# Patient Record
Sex: Female | Born: 1940 | Race: Black or African American | Hispanic: No | Marital: Single | Smoking: Never smoker
Health system: Southern US, Community
[De-identification: ages and names within clinical notes are randomized; demographics above are authoritative.]

## PROBLEM LIST (undated history)

## (undated) DIAGNOSIS — Z9289 Personal history of other medical treatment: Secondary | ICD-10-CM

## (undated) DIAGNOSIS — K5190 Ulcerative colitis, unspecified, without complications: Secondary | ICD-10-CM

## (undated) DIAGNOSIS — Z9221 Personal history of antineoplastic chemotherapy: Secondary | ICD-10-CM

## (undated) DIAGNOSIS — I10 Essential (primary) hypertension: Secondary | ICD-10-CM

## (undated) DIAGNOSIS — E559 Vitamin D deficiency, unspecified: Secondary | ICD-10-CM

## (undated) DIAGNOSIS — C569 Malignant neoplasm of unspecified ovary: Secondary | ICD-10-CM

## (undated) DIAGNOSIS — D509 Iron deficiency anemia, unspecified: Secondary | ICD-10-CM

## (undated) DIAGNOSIS — E782 Mixed hyperlipidemia: Secondary | ICD-10-CM

## (undated) DIAGNOSIS — H269 Unspecified cataract: Secondary | ICD-10-CM

## (undated) DIAGNOSIS — M1990 Unspecified osteoarthritis, unspecified site: Secondary | ICD-10-CM

## (undated) DIAGNOSIS — F209 Schizophrenia, unspecified: Secondary | ICD-10-CM

## (undated) DIAGNOSIS — K5090 Crohn's disease, unspecified, without complications: Secondary | ICD-10-CM

## (undated) DIAGNOSIS — E538 Deficiency of other specified B group vitamins: Secondary | ICD-10-CM

## (undated) HISTORY — PX: REPLACEMENT TOTAL KNEE: SUR1224

---

## 2015-04-20 HISTORY — PX: PORTACATH PLACEMENT: SHX2246

## 2015-04-25 ENCOUNTER — Inpatient Hospital Stay (HOSPITAL_COMMUNITY)
Admission: EM | Admit: 2015-04-25 | Discharge: 2015-04-28 | DRG: 357 | Disposition: A | Discharge status: Home / Self Care | Payer: Medicare Other | Attending: Family Medicine | Admitting: Family Medicine

## 2015-04-25 ENCOUNTER — Emergency Department (HOSPITAL_COMMUNITY): Payer: Medicare Other

## 2015-04-25 ENCOUNTER — Encounter (HOSPITAL_COMMUNITY): Payer: Self-pay | Admitting: Emergency Medicine

## 2015-04-25 DIAGNOSIS — R180 Malignant ascites: Secondary | ICD-10-CM | POA: Diagnosis present

## 2015-04-25 DIAGNOSIS — K529 Noninfective gastroenteritis and colitis, unspecified: Secondary | ICD-10-CM | POA: Diagnosis present

## 2015-04-25 DIAGNOSIS — C786 Secondary malignant neoplasm of retroperitoneum and peritoneum: Principal | ICD-10-CM | POA: Diagnosis present

## 2015-04-25 DIAGNOSIS — D649 Anemia, unspecified: Secondary | ICD-10-CM | POA: Diagnosis present

## 2015-04-25 DIAGNOSIS — E440 Moderate protein-calorie malnutrition: Secondary | ICD-10-CM | POA: Diagnosis present

## 2015-04-25 DIAGNOSIS — K668 Other specified disorders of peritoneum: Secondary | ICD-10-CM | POA: Diagnosis not present

## 2015-04-25 DIAGNOSIS — E559 Vitamin D deficiency, unspecified: Secondary | ICD-10-CM | POA: Diagnosis present

## 2015-04-25 DIAGNOSIS — E782 Mixed hyperlipidemia: Secondary | ICD-10-CM | POA: Diagnosis present

## 2015-04-25 DIAGNOSIS — N19 Unspecified kidney failure: Secondary | ICD-10-CM

## 2015-04-25 DIAGNOSIS — K5190 Ulcerative colitis, unspecified, without complications: Secondary | ICD-10-CM | POA: Diagnosis present

## 2015-04-25 DIAGNOSIS — Z79899 Other long term (current) drug therapy: Secondary | ICD-10-CM

## 2015-04-25 DIAGNOSIS — R1900 Intra-abdominal and pelvic swelling, mass and lump, unspecified site: Secondary | ICD-10-CM | POA: Diagnosis present

## 2015-04-25 DIAGNOSIS — Z6824 Body mass index (BMI) 24.0-24.9, adult: Secondary | ICD-10-CM | POA: Diagnosis not present

## 2015-04-25 DIAGNOSIS — E538 Deficiency of other specified B group vitamins: Secondary | ICD-10-CM | POA: Diagnosis present

## 2015-04-25 DIAGNOSIS — N390 Urinary tract infection, site not specified: Secondary | ICD-10-CM | POA: Diagnosis present

## 2015-04-25 DIAGNOSIS — F209 Schizophrenia, unspecified: Secondary | ICD-10-CM | POA: Diagnosis present

## 2015-04-25 DIAGNOSIS — R188 Other ascites: Secondary | ICD-10-CM | POA: Diagnosis not present

## 2015-04-25 DIAGNOSIS — D509 Iron deficiency anemia, unspecified: Secondary | ICD-10-CM | POA: Diagnosis present

## 2015-04-25 DIAGNOSIS — N8110 Cystocele, unspecified: Secondary | ICD-10-CM | POA: Diagnosis present

## 2015-04-25 DIAGNOSIS — C801 Malignant (primary) neoplasm, unspecified: Secondary | ICD-10-CM

## 2015-04-25 DIAGNOSIS — R591 Generalized enlarged lymph nodes: Secondary | ICD-10-CM | POA: Diagnosis not present

## 2015-04-25 DIAGNOSIS — R197 Diarrhea, unspecified: Secondary | ICD-10-CM | POA: Diagnosis present

## 2015-04-25 DIAGNOSIS — Z96651 Presence of right artificial knee joint: Secondary | ICD-10-CM | POA: Diagnosis present

## 2015-04-25 DIAGNOSIS — C569 Malignant neoplasm of unspecified ovary: Secondary | ICD-10-CM | POA: Diagnosis present

## 2015-04-25 DIAGNOSIS — R599 Enlarged lymph nodes, unspecified: Secondary | ICD-10-CM | POA: Diagnosis not present

## 2015-04-25 DIAGNOSIS — R590 Localized enlarged lymph nodes: Secondary | ICD-10-CM | POA: Diagnosis present

## 2015-04-25 DIAGNOSIS — Z7982 Long term (current) use of aspirin: Secondary | ICD-10-CM | POA: Diagnosis not present

## 2015-04-25 DIAGNOSIS — N179 Acute kidney failure, unspecified: Secondary | ICD-10-CM | POA: Diagnosis present

## 2015-04-25 DIAGNOSIS — R110 Nausea: Secondary | ICD-10-CM | POA: Diagnosis present

## 2015-04-25 DIAGNOSIS — R1110 Vomiting, unspecified: Secondary | ICD-10-CM

## 2015-04-25 DIAGNOSIS — I10 Essential (primary) hypertension: Secondary | ICD-10-CM | POA: Diagnosis present

## 2015-04-25 DIAGNOSIS — K669 Disorder of peritoneum, unspecified: Secondary | ICD-10-CM | POA: Diagnosis not present

## 2015-04-25 HISTORY — DX: Schizophrenia, unspecified: F20.9

## 2015-04-25 HISTORY — DX: Crohn's disease, unspecified, without complications: K50.90

## 2015-04-25 HISTORY — DX: Iron deficiency anemia, unspecified: D50.9

## 2015-04-25 HISTORY — DX: Vitamin D deficiency, unspecified: E55.9

## 2015-04-25 HISTORY — DX: Mixed hyperlipidemia: E78.2

## 2015-04-25 HISTORY — DX: Essential (primary) hypertension: I10

## 2015-04-25 HISTORY — DX: Deficiency of other specified B group vitamins: E53.8

## 2015-04-25 HISTORY — DX: Ulcerative colitis, unspecified, without complications: K51.90

## 2015-04-25 LAB — TSH: TSH: 1.99 u[IU]/mL (ref 0.350–4.500)

## 2015-04-25 LAB — COMPREHENSIVE METABOLIC PANEL
ALBUMIN: 3.7 g/dL (ref 3.5–5.0)
ALT: 17 U/L (ref 14–54)
AST: 40 U/L (ref 15–41)
Alkaline Phosphatase: 57 U/L (ref 38–126)
Anion gap: 13 (ref 5–15)
BUN: 43 mg/dL — AB (ref 6–20)
CALCIUM: 9.2 mg/dL (ref 8.9–10.3)
CO2: 20 mmol/L — ABNORMAL LOW (ref 22–32)
CREATININE: 2.69 mg/dL — AB (ref 0.44–1.00)
Chloride: 105 mmol/L (ref 101–111)
GFR calc Af Amer: 19 mL/min — ABNORMAL LOW (ref >60)
GFR calc non Af Amer: 17 mL/min — ABNORMAL LOW (ref >60)
Glucose, Bld: 93 mg/dL (ref 65–99)
POTASSIUM: 4.4 mmol/L (ref 3.5–5.1)
Sodium: 138 mmol/L (ref 135–145)
TOTAL PROTEIN: 7.6 g/dL (ref 6.5–8.1)
Total Bilirubin: 0.6 mg/dL (ref 0.3–1.2)

## 2015-04-25 LAB — PROTIME-INR
INR: 1.28 (ref 0.00–1.49)
PROTHROMBIN TIME: 16.2 s — AB (ref 11.6–15.2)

## 2015-04-25 LAB — RETICULOCYTES
RBC.: 2.88 MIL/uL — AB (ref 3.87–5.11)
RETIC COUNT ABSOLUTE: 40.3 10*3/uL (ref 19.0–186.0)
Retic Ct Pct: 1.4 % (ref 0.4–3.1)

## 2015-04-25 LAB — CBC
HEMATOCRIT: 26.8 % — AB (ref 36.0–46.0)
Hemoglobin: 8.4 g/dL — ABNORMAL LOW (ref 12.0–15.0)
MCH: 28.9 pg (ref 26.0–34.0)
MCHC: 31.3 g/dL (ref 30.0–36.0)
MCV: 92.1 fL (ref 78.0–100.0)
Platelets: 411 10*3/uL — ABNORMAL HIGH (ref 150–400)
RBC: 2.91 MIL/uL — AB (ref 3.87–5.11)
RDW: 14.9 % (ref 11.5–15.5)
WBC: 8.3 10*3/uL (ref 4.0–10.5)

## 2015-04-25 LAB — URINE MICROSCOPIC-ADD ON

## 2015-04-25 LAB — URINALYSIS, ROUTINE W REFLEX MICROSCOPIC
Glucose, UA: NEGATIVE mg/dL
Nitrite: NEGATIVE
PH: 5 (ref 5.0–8.0)
Protein, ur: 30 mg/dL — AB
Specific Gravity, Urine: >1.03 — ABNORMAL HIGH (ref 1.005–1.030)
UROBILINOGEN UA: 0.2 mg/dL (ref 0.0–1.0)

## 2015-04-25 LAB — LACTATE DEHYDROGENASE: LDH: 460 U/L — ABNORMAL HIGH (ref 98–192)

## 2015-04-25 LAB — LIPASE, BLOOD: Lipase: 21 U/L — ABNORMAL LOW (ref 22–51)

## 2015-04-25 MED ORDER — CEFTRIAXONE SODIUM 1 G IJ SOLR
1.0000 g | Freq: Once | INTRAMUSCULAR | Status: AC
  Administered 2015-04-25: 1 g via INTRAVENOUS
  Filled 2015-04-25: qty 10

## 2015-04-25 MED ORDER — SODIUM CHLORIDE 0.9 % IV SOLN
INTRAVENOUS | Status: DC
Start: 2015-04-26 — End: 2015-04-25
  Administered 2015-04-25 (×2): via INTRAVENOUS

## 2015-04-25 MED ORDER — SODIUM CHLORIDE 0.9 % IV SOLN: INTRAVENOUS | Status: DC

## 2015-04-25 MED ORDER — QUETIAPINE FUMARATE ER 300 MG PO TB24
300.0000 mg | ORAL_TABLET | Freq: Every day | ORAL | Status: DC
  Administered 2015-04-25 – 2015-04-27 (×3): 300 mg via ORAL
  Filled 2015-04-25 (×4): qty 1

## 2015-04-25 MED ORDER — ACETAMINOPHEN 325 MG PO TABS: 650.0000 mg | ORAL_TABLET | Freq: Four times a day (QID) | ORAL | Status: DC | PRN

## 2015-04-25 MED ORDER — FAMOTIDINE IN NACL 20-0.9 MG/50ML-% IV SOLN
INTRAVENOUS | Status: AC
  Filled 2015-04-25: qty 50

## 2015-04-25 MED ORDER — ALPRAZOLAM 0.5 MG PO TABS
0.5000 mg | ORAL_TABLET | Freq: Three times a day (TID) | ORAL | Status: DC | PRN
  Filled 2015-04-25: qty 1

## 2015-04-25 MED ORDER — DEXTROSE 5 % IV SOLN
1.0000 g | INTRAVENOUS | Status: DC
  Administered 2015-04-26 – 2015-04-27 (×2): 1 g via INTRAVENOUS
  Filled 2015-04-25 (×2): qty 10

## 2015-04-25 MED ORDER — SULFASALAZINE 500 MG PO TABS
500.0000 mg | ORAL_TABLET | Freq: Three times a day (TID) | ORAL | Status: DC
  Administered 2015-04-25 – 2015-04-28 (×8): 500 mg via ORAL
  Filled 2015-04-25 (×11): qty 1

## 2015-04-25 MED ORDER — MORPHINE SULFATE 2 MG/ML IJ SOLN: 2.0000 mg | INTRAMUSCULAR | Status: DC | PRN

## 2015-04-25 MED ORDER — GUAIFENESIN-DM 100-10 MG/5ML PO SYRP: 5.0000 mL | ORAL_SOLUTION | ORAL | Status: DC | PRN

## 2015-04-25 MED ORDER — SODIUM CHLORIDE 0.9 % IV SOLN
INTRAVENOUS | Status: DC
  Administered 2015-04-25 – 2015-04-26 (×3): via INTRAVENOUS
  Administered 2015-04-27: 1 mL via INTRAVENOUS

## 2015-04-25 MED ORDER — ONDANSETRON HCL 4 MG/2ML IJ SOLN
4.0000 mg | Freq: Once | INTRAMUSCULAR | Status: AC | PRN
  Administered 2015-04-25: 4 mg via INTRAVENOUS
  Filled 2015-04-25: qty 2

## 2015-04-25 MED ORDER — SULFASALAZINE 500 MG PO TBEC
DELAYED_RELEASE_TABLET | ORAL | Status: AC
  Filled 2015-04-25: qty 2

## 2015-04-25 MED ORDER — PROMETHAZINE HCL 12.5 MG PO TABS: 12.5000 mg | ORAL_TABLET | Freq: Four times a day (QID) | ORAL | Status: DC | PRN

## 2015-04-25 MED ORDER — SODIUM CHLORIDE 0.9 % IV BOLUS (SEPSIS)
1000.0000 mL | Freq: Once | INTRAVENOUS | Status: AC
  Administered 2015-04-25: 1000 mL via INTRAVENOUS

## 2015-04-25 MED ORDER — HYDRALAZINE HCL 20 MG/ML IJ SOLN: 5.0000 mg | INTRAMUSCULAR | Status: DC | PRN

## 2015-04-25 MED ORDER — FAMOTIDINE IN NACL 20-0.9 MG/50ML-% IV SOLN
20.0000 mg | INTRAVENOUS | Status: DC
  Administered 2015-04-25 – 2015-04-27 (×3): 20 mg via INTRAVENOUS
  Filled 2015-04-25 (×4): qty 50

## 2015-04-25 MED ORDER — ONDANSETRON HCL 4 MG/2ML IJ SOLN: 4.0000 mg | Freq: Once | INTRAMUSCULAR | Status: DC

## 2015-04-25 MED ORDER — IOHEXOL 300 MG/ML  SOLN
50.0000 mL | Freq: Once | INTRAMUSCULAR | Status: AC | PRN
  Administered 2015-04-25: 50 mL via ORAL

## 2015-04-25 MED ORDER — ONDANSETRON HCL 4 MG/2ML IJ SOLN
4.0000 mg | Freq: Four times a day (QID) | INTRAMUSCULAR | Status: DC
Start: 2015-04-25 — End: 2015-04-28
  Administered 2015-04-25 – 2015-04-28 (×12): 4 mg via INTRAVENOUS
  Filled 2015-04-25 (×12): qty 2

## 2015-04-25 MED ORDER — SODIUM CHLORIDE 0.9 % IV BOLUS (SEPSIS): 250.0000 mL | Freq: Once | INTRAVENOUS | Status: DC

## 2015-04-25 MED ORDER — MIRTAZAPINE 15 MG PO TABS
7.5000 mg | ORAL_TABLET | Freq: Every day | ORAL | Status: DC
  Administered 2015-04-25 – 2015-04-27 (×3): 7.5 mg via ORAL
  Filled 2015-04-25 (×3): qty 1

## 2015-04-25 MED ORDER — ALUM & MAG HYDROXIDE-SIMETH 200-200-20 MG/5ML PO SUSP: 30.0000 mL | Freq: Four times a day (QID) | ORAL | Status: DC | PRN

## 2015-04-25 MED ORDER — ACETAMINOPHEN 650 MG RE SUPP: 650.0000 mg | Freq: Four times a day (QID) | RECTAL | Status: DC | PRN

## 2015-04-25 MED ORDER — AMLODIPINE BESYLATE 5 MG PO TABS
10.0000 mg | ORAL_TABLET | Freq: Every day | ORAL | Status: DC
  Administered 2015-04-26 – 2015-04-28 (×3): 10 mg via ORAL
  Filled 2015-04-25 (×4): qty 2

## 2015-04-25 NOTE — ED Provider Notes (Addendum)
CSN: [id number]     Arrival date & time 04/25/15  1016 History  This chart was scribed for Latasha Sorrow, MD by Thea Alken, ED Scribe. This patient was seen in room APA06/APA06 and the patient's care was started at 11:53 AM.   Chief Complaint  Patient presents with  . Nausea   The history is provided by the patient. No language interpreter was used.    HPI Comments:  Latasha [name] is a 74 y.o. female with pmhx of chron's who present to the Emergency Department complaining of nausea and emesis. Pt states she's had a couple episodes of emesis a day, including today as well as multiple episode of diarrhea for the past 2 weeks.  She's had associated SOB, dizziness and leg swelling.  She denies blood in stool and vomit. Pt denies fever, chills, visiual changes, rhinorrhea, cough, sore throat, SOB, CP, abdominal pain, emesis, diarrhea, dysuria, rash and HA.  Past Medical History  Diagnosis Date  . Hypertension   . Schizophrenia   . Regional enteritis   . Ulcerative colitis   . Mixed hyperlipidemia   . Vitamin D deficiency   . B12 deficiency   . Iron deficiency anemia    Past Surgical History  Procedure Laterality Date  . Replacement total knee      right knee in 2003   No family history on file. History  Substance Use Topics  . Smoking status: Never Smoker   . Smokeless tobacco: Not on file  . Alcohol Use: No   OB History    No data available     Review of Systems  Constitutional: Negative for fever and chills.  HENT: Negative for rhinorrhea and sore throat.   Eyes: Negative for visual disturbance.  Respiratory: Positive for shortness of breath. Negative for cough.   Cardiovascular: Positive for leg swelling. Negative for chest pain.  Gastrointestinal: Positive for nausea, vomiting and diarrhea. Negative for abdominal pain and blood in stool.  Genitourinary: Negative for dysuria and difficulty urinating.  Musculoskeletal: Negative for back pain.  Skin: Negative for rash.   Neurological: Positive for dizziness. Negative for headaches.  Hematological: Does not bruise/bleed easily.    Allergies  Review of patient's allergies indicates no known allergies.  Home Medications   Prior to Admission medications   Medication Sig Start Date End Date Taking? Authorizing Provider  amLODipine (NORVASC) 10 MG tablet Take 10 mg by mouth daily.   Yes Historical Provider, MD  aspirin EC 81 MG tablet Take 81 mg by mouth daily.   Yes Historical Provider, MD  ferrous sulfate 325 (65 FE) MG EC tablet Take 325 mg by mouth 2 (two) times daily.   Yes Historical Provider, MD  folic acid (FOLVITE) 1 MG tablet Take 1 mg by mouth daily.   Yes Historical Provider, MD  lisinopril-hydrochlorothiazide (PRINZIDE,ZESTORETIC) 20-25 MG per tablet Take 1 tablet by mouth daily.   Yes Historical Provider, MD  lovastatin (MEVACOR) 10 MG tablet Take 10 mg by mouth at bedtime.   Yes Historical Provider, MD  mirtazapine (REMERON) 15 MG tablet Take 7.5 mg by mouth at bedtime.   Yes Historical Provider, MD  naproxen (NAPROSYN) 500 MG tablet Take 500 mg by mouth every 12 (twelve) hours as needed for mild pain.   Yes Historical Provider, MD  QUEtiapine (SEROQUEL XR) 300 MG 24 hr tablet Take 300 mg by mouth at bedtime.   Yes Historical Provider, MD  sulfaSALAzine (AZULFIDINE) 500 MG tablet Take 500 mg by mouth 3 (three)  times daily.   Yes Historical Provider, MD  vitamin B-12 (CYANOCOBALAMIN) 1000 MCG tablet Take 1,000 mcg by mouth daily.   Yes Historical Provider, MD   BP 143/66 mmHg  Pulse 112  Temp(Src) 98 F (36.7 C) (Oral)  Resp 18  Ht 5' 6"  (1.676 m)  Wt 170 lb (77.111 kg)  BMI 27.45 kg/m2  SpO2 98% Physical Exam  Constitutional: She is oriented to person, place, and time. She appears well-developed and well-nourished. No distress.  HENT:  Head: Normocephalic and atraumatic.  Mouth/Throat: Oropharynx is clear and moist.  Eyes: Conjunctivae and EOM are normal. Pupils are equal, round, and  reactive to light. No scleral icterus.  Neck: Neck supple.  Cardiovascular: Normal rate, regular rhythm and normal heart sounds.   No murmur heard. Pulmonary/Chest: Effort normal and breath sounds normal. She has no wheezes. She has no rales.  Abdominal: Bowel sounds are normal. She exhibits distension. There is no tenderness.  Musculoskeletal: Normal range of motion. She exhibits no edema.  No leg swelling.  Neurological: She is alert and oriented to person, place, and time. No cranial nerve deficit. She exhibits normal muscle tone. Coordination normal.  Skin: Skin is warm and dry.  Psychiatric: She has a normal mood and affect. Her behavior is normal.  Nursing note and vitals reviewed.  ED Course  Procedures (including critical care time) DIAGNOSTIC STUDIES: Oxygen Saturation is 98% on RA, normal by my interpretation.    COORDINATION OF CARE: 12:06 PM- Pt advised of plan for treatment which includes CT abdomen and pt agrees.  Labs Review Labs Reviewed  COMPREHENSIVE METABOLIC PANEL - Abnormal; Notable for the following:    CO2 20 (*)    BUN 43 (*)    Creatinine, Ser 2.69 (*)    GFR calc non Af Amer 17 (*)    GFR calc Af Amer 19 (*)    All other components within normal limits  CBC - Abnormal; Notable for the following:    RBC 2.91 (*)    Hemoglobin 8.4 (*)    HCT 26.8 (*)    Platelets 411 (*)    All other components within normal limits  URINALYSIS, ROUTINE W REFLEX MICROSCOPIC (NOT AT [facility]) - Abnormal; Notable for the following:    APPearance CLOUDY (*)    Specific Gravity, Urine >1.030 (*)    Hgb urine dipstick SMALL (*)    Bilirubin Urine SMALL (*)    Ketones, ur TRACE (*)    Protein, ur 30 (*)    Leukocytes, UA TRACE (*)    All other components within normal limits  URINE MICROSCOPIC-ADD ON - Abnormal; Notable for the following:    Squamous Epithelial / LPF FEW (*)    Bacteria, UA MANY (*)    Casts GRANULAR CAST (*)    All other components within normal limits   LIPASE, BLOOD - Abnormal; Notable for the following:    Lipase 21 (*)    All other components within normal limits   Results for orders placed or performed during the hospital encounter of 04/25/15  Comprehensive metabolic panel  Result Value Ref Range   Sodium 138 135 - 145 mmol/L   Potassium 4.4 3.5 - 5.1 mmol/L   Chloride 105 101 - 111 mmol/L   CO2 20 (L) 22 - 32 mmol/L   Glucose, Bld 93 65 - 99 mg/dL   BUN 43 (H) 6 - 20 mg/dL   Creatinine, Ser 2.69 (H) 0.44 - 1.00 mg/dL   Calcium 9.2  8.9 - 10.3 mg/dL   Total Protein 7.6 6.5 - 8.1 g/dL   Albumin 3.7 3.5 - 5.0 g/dL   AST 40 15 - 41 U/L   ALT 17 14 - 54 U/L   Alkaline Phosphatase 57 38 - 126 U/L   Total Bilirubin 0.6 0.3 - 1.2 mg/dL   GFR calc non Af Amer 17 (L) >60 mL/min   GFR calc Af Amer 19 (L) >60 mL/min   Anion gap 13 5 - 15  CBC  Result Value Ref Range   WBC 8.3 4.0 - 10.5 K/uL   RBC 2.91 (L) 3.87 - 5.11 MIL/uL   Hemoglobin 8.4 (L) 12.0 - 15.0 g/dL   HCT 26.8 (L) 36.0 - 46.0 %   MCV 92.1 78.0 - 100.0 fL   MCH 28.9 26.0 - 34.0 pg   MCHC 31.3 30.0 - 36.0 g/dL   RDW 14.9 11.5 - 15.5 %   Platelets 411 (H) 150 - 400 K/uL  Urinalysis, Routine w reflex microscopic (not at [facility])  Result Value Ref Range   Color, Urine YELLOW YELLOW   APPearance CLOUDY (A) CLEAR   Specific Gravity, Urine >1.030 (H) 1.005 - 1.030   pH 5.0 5.0 - 8.0   Glucose, UA NEGATIVE NEGATIVE mg/dL   Hgb urine dipstick SMALL (A) NEGATIVE   Bilirubin Urine SMALL (A) NEGATIVE   Ketones, ur TRACE (A) NEGATIVE mg/dL   Protein, ur 30 (A) NEGATIVE mg/dL   Urobilinogen, UA 0.2 0.0 - 1.0 mg/dL   Nitrite NEGATIVE NEGATIVE   Leukocytes, UA TRACE (A) NEGATIVE  Urine microscopic-add on  Result Value Ref Range   Squamous Epithelial / LPF FEW (A) RARE   WBC, UA 3-6 <3 WBC/hpf   RBC / HPF 0-2 <3 RBC/hpf   Bacteria, UA MANY (A) RARE   Casts GRANULAR CAST (A) NEGATIVE  Lipase, blood  Result Value Ref Range   Lipase 21 (L) 22 - 51 U/L     Imaging  Review Ct Abdomen Pelvis Wo Contrast  04/25/2015   CLINICAL DATA:  Vomiting and diarrhea for 2 weeks, shortness of breath, dizziness, leg swelling, history hypertension, ulcerative colitis  EXAM: CT ABDOMEN AND PELVIS WITHOUT CONTRAST  TECHNIQUE: Multidetector CT imaging of the abdomen and pelvis was performed following the standard protocol without IV contrast. IV contrast not utilized due to renal dysfunction, creatinine 2.69. Sagittal and coronal MPR images reconstructed from axial data set. Patient drank dilute oral contrast for exam.  COMPARISON:  None  FINDINGS: Minimal RIGHT pleural effusion.  Within limits of a nonenhanced exam, liver, spleen, pancreas, kidneys, and adrenal glands normal. Normal appendix.  Cystocele present with significant inferior descent of urinary bladder in pelvis.  Extensive ascites.  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  These masses measure up to 5.1 cm in greatest dimension.  Uterus is small and atrophic with multiple calcifications.  Numerous pelvic phleboliths.  Stomach and bowel loops displaced by ascites but otherwise unremarkable.  Upper normal sized to borderline enlarged retroperitoneal nodes without definite adenopathy.  No definite free air, hernia, or acute osseous findings.  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  Cystocele.   Electronically Signed   By: Lavonia Dana M.D.   On: 04/25/2015 15:04     EKG Interpretation None      MDM   Final diagnoses:  Vomiting and diarrhea  Abdominal mass  Renal failure  Anemia, unspecified anemia type    Patient's labs show evidence of anemia. Patient states she has chronic anemias on iron for that. Hemoglobin is above 8 no transfusion required at this time. Labs also showed  evidence of like a prerenal renal failure picture with elevated BUN and creatinine. Urine was very concentrated. Patient clinically appeared a bit dry. Patient received 1 L bolus of normal saline and 100 mL per hour maintenance rate. Patient will require admission for this.  In addition CT scan of the abdomen shows multiple abdominal masses based on radiology very concerning for metastatic ovarian cancer. Patient will require admission for this and beginning of evaluation and workup.  Patient's primary care doctor is at the [facility].  I personally performed the services described in this documentation, which was scribed in my presence. The recorded information has been reviewed and is accurate.     Latasha Sorrow, MD 04/25/15 1518  Latasha Sorrow, MD 04/25/15 [phone]

## 2015-04-25 NOTE — H&P (Signed)
[facility] History and Physical  Sidnie Swalley GGY:[id number] DOB: May 03, 1941 DOA: 04/25/2015  Referring physician: ED physician, Dr. Rogene Houston PCP: [facility]   Chief Complaint: Nausea and diarrhea  HPI: Latasha [name] is a 74 y.o. female With a history of inflammatory bowel disease (query ulcerative colitis versus Crohn's disease), iron deficiency anemia, and schizophrenia, who presents to the emergency department with a chief complaint of nausea and diarrhea. Her symptoms started approximately 2 weeks ago. They have become progressively worse. She has had at least 3-4 loose bowel movements daily. She denies seeing bright red blood, but she describes her stools as "dark"-she attributes this to taking iron supplements. She denies vomiting, but has had dry heaving. Her abdomen has become more swollen over the past couple of weeks. Her appetite has been poor. She has lost approximately 15-20 pounds over the past 6-8 months. She denies fever, chills, or night sweats. She denies abdominal pain or chest pain. She has had some shortness of breath. She denies vaginal bleeding. Her last Pap smear was approximately 20 years ago. Her mammogram this year was within normal limits.  In the ED, she was mildly tachycardic, but otherwise hemodynamically stable. She was oxygenating between 90 and 97% on room air and supplemental oxygen respectively. Her lab data were significant for a CO2 of 20, BUN 43, creatinine 2.69, normal lipase, normal LFTs, hemoglobin of 8.4, and a urinalysis that showed many bacteria and 3-6 WBCs. 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 She is being admitted for further evaluation and management.     Review of Systems:  As above in history present illness. In addition, she denies  auditory or visual hallucinations. Otherwise, review of systems is negative.  Past Medical History  Diagnosis Date  . Hypertension   . Schizophrenia   . Regional enteritis   . Ulcerative colitis   . Mixed hyperlipidemia   . Vitamin D deficiency   . B12 deficiency   . Iron deficiency anemia    Past Surgical History  Procedure Laterality Date  . Replacement total knee      right knee in 2003   Social History: She is single. She lives alone. She has one son, Jeneen Rinks. She denies tobacco, alcohol, and illicit drug use. She still drives.   No Known Allergies  Family history: Patient's parents are deceased, etiology is unknown.  Prior to Admission medications   Medication Sig Start Date End Date Taking? Authorizing Provider  amLODipine (NORVASC) 10 MG tablet Take 10 mg by mouth daily.   Yes Historical Provider, MD  aspirin EC 81 MG tablet Take 81 mg by mouth daily.   Yes Historical Provider, MD  ferrous sulfate 325 (65 FE) MG EC tablet Take 325 mg by mouth 2 (two) times daily.   Yes Historical Provider, MD  folic acid (FOLVITE) 1 MG tablet Take 1 mg by mouth daily.   Yes Historical Provider, MD  lisinopril-hydrochlorothiazide (PRINZIDE,ZESTORETIC) 20-25 MG per tablet Take 1 tablet by mouth daily.   Yes Historical Provider, MD  lovastatin (MEVACOR) 10 MG tablet Take 10 mg by mouth at bedtime.   Yes Historical Provider, MD  mirtazapine (REMERON) 15 MG tablet Take 7.5 mg by mouth at bedtime.   Yes Historical Provider, MD  naproxen (NAPROSYN) 500 MG tablet Take 500 mg by mouth every 12 (twelve) hours as needed for mild pain.   Yes  Historical Provider, MD  QUEtiapine (SEROQUEL XR) 300 MG 24 hr tablet Take 300 mg by mouth at bedtime.   Yes Historical Provider, MD  sulfaSALAzine (AZULFIDINE) 500 MG tablet Take 500 mg by mouth 3 (three) times daily.   Yes Historical Provider, MD  vitamin B-12 (CYANOCOBALAMIN) 1000 MCG tablet Take 1,000 mcg by mouth daily.   Yes Historical Provider, MD   Physical  Exam: Filed Vitals:   04/25/15 1406 04/25/15 1533 04/25/15 1642 04/25/15 1654  BP: 138/77 152/75 153/70   Pulse: 104 103 103   Temp: 97.6 F (36.4 C)  97.9 F (36.6 C)   TempSrc: Oral  Oral   Resp: 18  18   Height:   5' 6"  (1.676 m) 5' 6"  (1.676 m)  Weight:    69.899 kg (154 lb 1.6 oz)  SpO2: 94% 90% 97%     Wt Readings from Last 3 Encounters:  04/25/15 69.899 kg (154 lb 1.6 oz)    General:  Appears calm and comfortable; 74 year old African-American woman in no acute distress.  Eyes: PERRL, normal lids, irises & conjunctiva; Conjunctivae are clear and sclerae are white.  ENT: grossly normal hearing; oropharynx reveals mildly dry mucous membranes.  Neck: no LAD, masses or thyromegaly Cardiovascular: RRR, no m/r/g. No LE edema. Telemetry: Not applicable  Respiratory: CTA bilaterally, no w/r/r. Normal respiratory effort. Abdomen: Distended with ascites, hypoactive bowel sounds, tympanic with mild fluid wave, nontender to mild palpation; no masses palpated, but difficult secondary to ascites.  Skin: no rash or induration seen on limited exam Musculoskeletal: grossly normal tone BUE/BLE; No acute hot red joints.  Psychiatric: Flat and sad affect; occasionally with inappropriate smiling; speech is clear.  Neurologic: grossly non-focal; cranial nerves II through XII are intact.           Labs on Admission:  Basic Metabolic Panel:  Recent Labs Lab 04/25/15 1114  NA 138  K 4.4  CL 105  CO2 20*  GLUCOSE 93  BUN 43*  CREATININE 2.69*  CALCIUM 9.2   Liver Function Tests:  Recent Labs Lab 04/25/15 1114  AST 40  ALT 17  ALKPHOS 57  BILITOT 0.6  PROT 7.6  ALBUMIN 3.7    Recent Labs Lab 04/25/15 1220  LIPASE 21*   No results for input(s): AMMONIA in the last 168 hours. CBC:  Recent Labs Lab 04/25/15 1114  WBC 8.3  HGB 8.4*  HCT 26.8*  MCV 92.1  PLT 411*   Cardiac Enzymes: No results for input(s): CKTOTAL, CKMB, CKMBINDEX, TROPONINI in the last 168  hours.  BNP (last 3 results) No results for input(s): BNP in the last 8760 hours.  ProBNP (last 3 results) No results for input(s): PROBNP in the last 8760 hours.  CBG: No results for input(s): GLUCAP in the last 168 hours.  Radiological Exams on Admission: Ct Abdomen Pelvis Wo Contrast  04/25/2015   CLINICAL DATA:  Vomiting and diarrhea for 2 weeks, shortness of breath, dizziness, leg swelling, history hypertension, ulcerative colitis  EXAM: CT ABDOMEN AND PELVIS WITHOUT CONTRAST  TECHNIQUE: Multidetector CT imaging of the abdomen and pelvis was performed following the standard protocol without IV contrast. IV contrast not utilized due to renal dysfunction, creatinine 2.69. Sagittal and coronal MPR images reconstructed from axial data set. Patient drank dilute oral contrast for exam.  COMPARISON:  None  FINDINGS: Minimal RIGHT pleural effusion.  Within limits of a nonenhanced exam, liver, spleen, pancreas, kidneys, and adrenal glands normal. Normal appendix.  Cystocele present with significant  inferior descent of urinary bladder in pelvis.  Extensive ascites.  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  These masses measure up to 5.1 cm in greatest dimension.  Uterus is small and atrophic with multiple calcifications.  Numerous pelvic phleboliths.  Stomach and bowel loops displaced by ascites but otherwise unremarkable.  Upper normal sized to borderline enlarged retroperitoneal nodes without definite adenopathy.  No definite free air, hernia, or acute osseous findings.  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  Cystocele.   Electronically Signed   By: Lavonia Dana M.D.   On: 04/25/2015 15:04    EKG: Independently reviewed.   Assessment/Plan Principal Problem:    Nausea without vomiting Active Problems:   Ascites, malignant   Peritoneal carcinomatosis   Omental mass   Diarrhea   Acute kidney injury   UTI (lower urinary tract infection)   Normocytic anemia   IBD (inflammatory bowel disease)   Schizophrenia, unspecified type   1. Nausea without vomiting and reported diarrhea. Likely secondary to abdominal/pelvic CT findings of omental masses, ascites, and peritoneal carcinomatosis superimposed on known chronic IBD. 2. Abdominal ascites, likely malignant with associated omental masses and presumed peritoneal carcinomatosis. Per radiology, the findings are suggestive of ovarian cancer. 3. Possible urinary tract infection. Although the patient denies dysuria, under the circumstances, we'll treat empirically for pyuria and bacteriuria. 4. Inflammatory bowel disease. The patient had been seen many years ago at [facility] for either ulcerated colitis or Crohn's disease. The patient stated that she was told that she had Boles at one point or another. She is treated chronically with sulfasalazine. 5. Normocytic anemia. The patient has a history of vitamin B12 deficiency and iron deficiency for which she takes supplements for both. Her hemoglobin is 8.4 on admission. Baseline is unknown. 6. Acute kidney injury. This is presumed in the setting of poor oral intake and chronic treatment with Zestoretic. Baseline creatinine is unknown. 7. Essential hypertension. Her blood pressure is modestly elevated. She is treated chronically with amlodipine and Zestoretic. 8. Schizophrenia, unspecified type. Currently stable. She is treated with Seroquel X are chronically.    Plan: 1. For further evaluation, will order a pelvic ultrasound and CA-125. If the pelvic ultrasound reveals an ovarian mass, would consult GYN-Onc remotely. Could also consider IR consult for possible biopsy of the omental masses. 2. Will order a therapeutic and diagnostic paracentesis; will plan  for it being done on 04/27/15. Will order Gram stain, culture, cell count/differential, and cytology. 3. C. difficile PCR ordered in the ED. Will follow-up on the findings. 4. Will order an anemia panel to evaluate for anemia. Will also order TSH. 5. Will start IV fluid hydration. 6. Will continue amlodipine, but will hold Zestoretic due to acute kidney injury. We'll add when necessary IV hydralazine for hypertension. 7. Urine culture ordered and is pending. Will continue Rocephin for treatment of UTI. 8. Will start Zofran scheduled IV every 6 hours and add when necessary Phenergan. We'll also start IV Pepcid. 9. Clear liquid diet as tolerated.  Code Status: Full code  DVT Prophylaxis:SCDs  Family Communication:Discussed with son Jeneen Rinks.  Disposition Plan: Discharge when clinically appropriate.  Time spent: One hour  [facility] Pager [phone]

## 2015-04-25 NOTE — ED Notes (Signed)
Having nausea for last 2 weeks.  Denies vomiting.  Pt says, bowels are soft and have poor appetite and not able to taste foods.

## 2015-04-26 DIAGNOSIS — C801 Malignant (primary) neoplasm, unspecified: Secondary | ICD-10-CM

## 2015-04-26 DIAGNOSIS — R180 Malignant ascites: Secondary | ICD-10-CM

## 2015-04-26 DIAGNOSIS — N179 Acute kidney failure, unspecified: Secondary | ICD-10-CM

## 2015-04-26 DIAGNOSIS — C786 Secondary malignant neoplasm of retroperitoneum and peritoneum: Principal | ICD-10-CM

## 2015-04-26 DIAGNOSIS — R110 Nausea: Secondary | ICD-10-CM

## 2015-04-26 DIAGNOSIS — N390 Urinary tract infection, site not specified: Secondary | ICD-10-CM

## 2015-04-26 LAB — COMPREHENSIVE METABOLIC PANEL
ALBUMIN: 3 g/dL — AB (ref 3.5–5.0)
ALT: 16 U/L (ref 14–54)
AST: 35 U/L (ref 15–41)
Alkaline Phosphatase: 49 U/L (ref 38–126)
Anion gap: 7 (ref 5–15)
BUN: 30 mg/dL — ABNORMAL HIGH (ref 6–20)
CALCIUM: 8.5 mg/dL — AB (ref 8.9–10.3)
CO2: 22 mmol/L (ref 22–32)
Chloride: 109 mmol/L (ref 101–111)
Creatinine, Ser: 1.6 mg/dL — ABNORMAL HIGH (ref 0.44–1.00)
GFR calc non Af Amer: 31 mL/min — ABNORMAL LOW (ref >60)
GFR, EST AFRICAN AMERICAN: 36 mL/min — AB (ref >60)
GLUCOSE: 94 mg/dL (ref 65–99)
Potassium: 4.3 mmol/L (ref 3.5–5.1)
Sodium: 138 mmol/L (ref 135–145)
Total Bilirubin: 0.4 mg/dL (ref 0.3–1.2)
Total Protein: 6.3 g/dL — ABNORMAL LOW (ref 6.5–8.1)

## 2015-04-26 LAB — CBC
HEMATOCRIT: 26.5 % — AB (ref 36.0–46.0)
Hemoglobin: 8.2 g/dL — ABNORMAL LOW (ref 12.0–15.0)
MCH: 28.4 pg (ref 26.0–34.0)
MCHC: 30.9 g/dL (ref 30.0–36.0)
MCV: 91.7 fL (ref 78.0–100.0)
PLATELETS: 386 10*3/uL (ref 150–400)
RBC: 2.89 MIL/uL — AB (ref 3.87–5.11)
RDW: 14.8 % (ref 11.5–15.5)
WBC: 7.4 10*3/uL (ref 4.0–10.5)

## 2015-04-26 LAB — IRON AND TIBC
IRON: 20 ug/dL — AB (ref 28–170)
SATURATION RATIOS: 12 % (ref 10.4–31.8)
TIBC: 174 ug/dL — ABNORMAL LOW (ref 250–450)
UIBC: 154 ug/dL

## 2015-04-26 LAB — FERRITIN: Ferritin: 1066 ng/mL — ABNORMAL HIGH (ref 11–307)

## 2015-04-26 LAB — C DIFFICILE QUICK SCREEN W PCR REFLEX
C DIFFICILE (CDIFF) INTERP: NEGATIVE
C DIFFICLE (CDIFF) ANTIGEN: NEGATIVE
C Diff toxin: NEGATIVE

## 2015-04-26 LAB — VITAMIN B12: Vitamin B-12: 2055 pg/mL — ABNORMAL HIGH (ref 180–914)

## 2015-04-26 LAB — FOLATE: Folate: 39 ng/mL (ref >5.9)

## 2015-04-26 NOTE — Progress Notes (Signed)
PROGRESS NOTE  Latasha [name] SAY:[id number] DOB: [birth year] DOA: 04/25/2015 PCP: No primary care provider on file.  Summary: 89 yof with a history of inflammatory bowel disease iron deficiency anemia, and schizophrenia presented with complaints of nausea and diarrhea. 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 Admitted for further evaluation and management.  Assessment/[name]: 1. Nausea and diarrhea. Nausea controlled with Zofran. Diarrhea manageable. Cdiff negative.  Likely secondary to abdominal/pelvic CT findings of omental masses, ascites, and peritoneal carcinomatosis superimposed on known chronic IBD. 2. 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 3. AKI, improving.  4. Possible urinary tract infection. Urine culture pending.  5. Inflammatory bowel disease.The patient had been seen many years ago at [facility] for either ulcerated colitis or Crohn's disease. On sulfasalazine. 6. Normocytic anemia. Stable. HGB is currently 8.2. Baseline is unknown.  7. HTN 8. Schizophrenia, unspecified type. Currently stable. She is treated with Seroquel X are chronically.   Overall better today  Paracentesis in AM  Continue Zofran, pain control, IVF, antibiotics  Check BMP and CBC in AM  Consult oncology 8/8 for guidance  Code Status: Full  DVT prophylaxis: SCDs Family Communication: none present. Patient alert and understands above. Disposition [name]: home when improved  Latasha Hodgkins, MD  [facility]  Pager [phone] If 7PM-7AM, please contact night-coverage at [email or web address], password [facility] 04/26/2015, 8:26 AM  LOS: 1 day   Consultants:    Procedures:    Antibiotics:  Rocephin 04/26/15>>  HPI/Subjective: Feels better. No n/v. Little pain. Medication helping with nausea. Abdomen is "puffed" but not  painful.   Objective: Filed Vitals:   04/25/15 1642 04/25/15 1654 04/25/15 2059 04/26/15 0551  BP: 153/70  132/66 116/60  Pulse: 103  97 106  Temp: 97.9 F (36.6 C)  98.7 F (37.1 C) 97.9 F (36.6 C)  TempSrc: Oral  Oral Oral  Resp: 18  18 16   Height: 5' 6"  (1.676 m) 5' 6"  (1.676 m)    Weight:  69.899 kg (154 lb 1.6 oz)  69 kg (152 lb 1.9 oz)  SpO2: 97%  98% 98%    Intake/Output Summary (Last 24 hours) at 04/26/15 0826 Last data filed at 04/26/15 0700  Gross per 24 hour  Intake 1196.25 ml  Output      0 ml  Net 1196.25 ml     Filed Weights   04/25/15 1039 04/25/15 1654 04/26/15 0551  Weight: 77.111 kg (170 lb) 69.899 kg (154 lb 1.6 oz) 69 kg (152 lb 1.9 oz)    Exam:  Afebrile, VSS, not hypoxic General:  Appears calm and comfortable ENT: grossly normal hearing, lips & tongue Cardiovascular: RRR, no m/r/g. No LE edema. Respiratory: CTA bilaterally, no w/r/r. Normal respiratory effort. 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  TSH WNL  Pending data:  Urine culture   Scheduled Meds: . amLODipine  10 mg Oral Daily  . cefTRIAXone (ROCEPHIN)  IV  1 g Intravenous Q24H  . famotidine (PEPCID) IV  20 mg Intravenous Q24H  . mirtazapine  7.5 mg Oral QHS  .  ondansetron (ZOFRAN) IV  4 mg Intravenous 4 times per day  . QUEtiapine  300 mg Oral QHS  . sulfaSALAzine  500 mg Oral TID   Continuous Infusions: . sodium chloride 75 mL/hr at 04/26/15 0600    Principal Problem:   Nausea without vomiting Active Problems:   Ascites, malignant   Peritoneal carcinomatosis    Omental mass   Acute kidney injury   UTI (lower urinary tract infection)   Normocytic anemia   IBD (inflammatory bowel disease)   Schizophrenia, unspecified type   Diarrhea   Time spent 20 minutes  I, Latasha [name], acting as scribe, recorded this note contemporaneously in the presence of Latasha [name]. Latasha [name], M.D. on 04/26/2015 .   I have reviewed the above documentation for accuracy and completeness, and I agree with the above. Latasha Hodgkins, MD

## 2015-04-26 NOTE — Progress Notes (Signed)
During shift assessment +3 Pitting edema noted to Left and Right Lower Extremities.  Pillow placed under patients feet.

## 2015-04-26 NOTE — Progress Notes (Signed)
Utilization review Completed Carlisle Beers RN BSN

## 2015-04-27 ENCOUNTER — Encounter (HOSPITAL_COMMUNITY): Payer: Self-pay

## 2015-04-27 ENCOUNTER — Inpatient Hospital Stay (HOSPITAL_COMMUNITY): Payer: Medicare Other

## 2015-04-27 DIAGNOSIS — R188 Other ascites: Secondary | ICD-10-CM

## 2015-04-27 DIAGNOSIS — K669 Disorder of peritoneum, unspecified: Secondary | ICD-10-CM

## 2015-04-27 DIAGNOSIS — R591 Generalized enlarged lymph nodes: Secondary | ICD-10-CM

## 2015-04-27 DIAGNOSIS — D649 Anemia, unspecified: Secondary | ICD-10-CM

## 2015-04-27 DIAGNOSIS — R599 Enlarged lymph nodes, unspecified: Secondary | ICD-10-CM

## 2015-04-27 DIAGNOSIS — K668 Other specified disorders of peritoneum: Secondary | ICD-10-CM

## 2015-04-27 HISTORY — PX: PARACENTESIS: SHX844

## 2015-04-27 LAB — CBC
HCT: 27.3 % — ABNORMAL LOW (ref 36.0–46.0)
Hemoglobin: 8.5 g/dL — ABNORMAL LOW (ref 12.0–15.0)
MCH: 28.5 pg (ref 26.0–34.0)
MCHC: 31.1 g/dL (ref 30.0–36.0)
MCV: 91.6 fL (ref 78.0–100.0)
Platelets: 414 10*3/uL — ABNORMAL HIGH (ref 150–400)
RBC: 2.98 MIL/uL — AB (ref 3.87–5.11)
RDW: 14.8 % (ref 11.5–15.5)
WBC: 7.8 10*3/uL (ref 4.0–10.5)

## 2015-04-27 LAB — BASIC METABOLIC PANEL
ANION GAP: 7 (ref 5–15)
BUN: 19 mg/dL (ref 6–20)
CALCIUM: 8.6 mg/dL — AB (ref 8.9–10.3)
CO2: 21 mmol/L — ABNORMAL LOW (ref 22–32)
CREATININE: 1.34 mg/dL — AB (ref 0.44–1.00)
Chloride: 110 mmol/L (ref 101–111)
GFR calc Af Amer: 45 mL/min — ABNORMAL LOW (ref >60)
GFR calc non Af Amer: 39 mL/min — ABNORMAL LOW (ref >60)
Glucose, Bld: 100 mg/dL — ABNORMAL HIGH (ref 65–99)
POTASSIUM: 4.5 mmol/L (ref 3.5–5.1)
Sodium: 138 mmol/L (ref 135–145)

## 2015-04-27 LAB — BODY FLUID CELL COUNT WITH DIFFERENTIAL
Eos, Fluid: 0 %
LYMPHS FL: 45 %
Monocyte-Macrophage-Serous Fluid: 39 % — ABNORMAL LOW (ref 50–90)
Neutrophil Count, Fluid: 16 % (ref 0–25)
Total Nucleated Cell Count, Fluid: 692 cu mm (ref 0–1000)

## 2015-04-27 LAB — URINE CULTURE: Culture: NO GROWTH

## 2015-04-27 LAB — PROTEIN, BODY FLUID: TOTAL PROTEIN, FLUID: 4.8 g/dL

## 2015-04-27 LAB — GRAM STAIN

## 2015-04-27 LAB — LACTATE DEHYDROGENASE, PLEURAL OR PERITONEAL FLUID: LD, Fluid: 739 U/L — ABNORMAL HIGH (ref 3–23)

## 2015-04-27 LAB — CA 125: CA 125: 3902 U/mL — ABNORMAL HIGH (ref 0.0–38.1)

## 2015-04-27 MED ORDER — ENOXAPARIN SODIUM 40 MG/0.4ML ~~LOC~~ SOLN
40.0000 mg | SUBCUTANEOUS | Status: DC
  Administered 2015-04-27: 40 mg via SUBCUTANEOUS
  Filled 2015-04-27: qty 0.4

## 2015-04-27 MED ORDER — BOOST / RESOURCE BREEZE PO LIQD
1.0000 | Freq: Three times a day (TID) | ORAL | Status: DC
  Administered 2015-04-27 – 2015-04-28 (×3): 1 via ORAL

## 2015-04-27 NOTE — Progress Notes (Addendum)
Initial Nutrition Assessment  DOCUMENTATION CODES:   Non-severe (moderate) malnutrition in context of chronic illness   Pt meets criteria for moderate MALNUTRITION in the context of chronic illness as evidenced by energy intake < 75% for >/= 1 month and mild depletion of muscle.   INTERVENTION:  Boost Breeze po TID, each supplement provides 250 kcal and 9 grams of protein   RD will continue to follow  NUTRITION DIAGNOSIS:   Increased nutrient needs related to catabolic illness as evidenced by estimated needs.   GOAL:   Patient will meet greater than or equal to 90% of their needs  MONITOR:   PO intake, Weight trends, I & O's, Labs  REASON FOR ASSESSMENT:   Malnutrition Screening Tool    ASSESSMENT:  Pt hx of IBD,  Anemia and schizophrenia. Presents with nausea and diarrhea which has affected her appetite over the past weeks. She has been trying to eat but says she can only take a few bites and no more. Home diet is regular but she limits lactose. Pt says, "my food just doesn't taste right".Her weight has decreased as well the past few months.   CT/abdomen/pelvis: findings multiple peritoneal masses  Pt s/p paracentesis today (3.7 liters) ascites removed.   Diet Order:  Diet clear liquid Room service appropriate?: Yes; Fluid consistency:: Thin  Skin:   WDL  Last BM:   8/8  Height:   Ht Readings from Last 1 Encounters:  04/25/15 5' 6"  (1.676 m)   Weight:   Wt Readings from Last 1 Encounters:  04/27/15 161 lb 14.4 oz (73.437 kg)    Ideal Body Weight:   59 kg  BMI:  Body mass index is 26.14 kg/(m^2).  Estimated Nutritional Needs:   Kcal:  1600-1800  Protein:  85-95 gr  Fluid:  per MD goals  EDUCATION NEEDS:  None identified at this time    Colman Cater MS,RD,CSG,LDN Office: #[phone] Pager: [phone]

## 2015-04-27 NOTE — Progress Notes (Signed)
Paracentesis complete no signs of distress. 3700 ml dark amber colored ascites removed.

## 2015-04-27 NOTE — Progress Notes (Signed)
PROGRESS NOTE  Latasha [name] VQM:[id number] DOB: 1940-12-31 DOA: 04/25/2015 PCP: No primary care provider on file.  Summary: 7 yof with a history of inflammatory bowel disease iron deficiency anemia, and schizophrenia presented with complaints of nausea and diarrhea. 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 Admitted for further evaluation and management.  Assessment/Plan: 1. Nausea and diarrhea. Resolved. Cdiff negative.  Likely secondary to abdominal/pelvic CT findings of omental masses, ascites, and peritoneal carcinomatosis superimposed on known chronic IBD. 2. 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 Oncology evaluation appreciated. Further workup pending. Pelvic ultrasound demonstrated masses and ascites but ovaries could not be clearly seen. CT chest suggestive for metastatic lymphadenopathy. 3. AKI, continues to improve. 4. Possible urinary tract infection. Urine culture negative. 5. Inflammatory bowel disease.The patient had been seen many years ago at [facility] for either ulcerated colitis or Crohn's disease. On sulfasalazine. 6. Normocytic anemia. Stable. Baseline unknown. Suspect anemia of chronic disease. 7. HTN, stable 8. Schizophrenia, unspecified type.    Overall improved status post paracentesis.   Discontinue antibiotics.  Follow-up oncology recommendations  Anticipate discharge next 24 hours-48 hours  Code Status: Full  DVT prophylaxis: SCDs Family Communication: none present. Patient alert and understands above. Disposition Plan: home when improved  Murray Hodgkins, MD  [facility]  Pager [phone] If 7PM-7AM, please contact night-coverage at [email or web address], password [facility] 04/27/2015, 1:14 PM  LOS: 2 days   Consultants:  Oncology  Procedures:  8/8 paracentesis 3700 ml  dark amber colored ascites removed  Antibiotics:  Rocephin 04/26/15>>  HPI/Subjective: Feeling better after paracentesis today. Eating okay. Bowels moving.  Objective: Filed Vitals:   04/27/15 1123 04/27/15 1159 04/27/15 1223 04/27/15 1307  BP: 118/65 147/76 132/72 139/61  Pulse:  108 110 114  Temp:    98 F (36.7 C)  TempSrc:    Oral  Resp:  16 16 16   Height:      Weight:      SpO2:  95% 96% 92%    Intake/Output Summary (Last 24 hours) at 04/27/15 1314 Last data filed at 04/27/15 1200  Gross per 24 hour  Intake   2330 ml  Output      0 ml  Net   2330 ml     Filed Weights   04/25/15 1654 04/26/15 0551 04/27/15 0457  Weight: 69.899 kg (154 lb 1.6 oz) 69 kg (152 lb 1.9 oz) 73.437 kg (161 lb 14.4 oz)    Exam: Afebrile, VSS, no hypoxia General:  Appears comfortable, calm. Cardiovascular: Regular rate and rhythm, no murmur, rub or gallop. Respiratory: Clear to auscultation bilaterally, no wheezes, rales or rhonchi. Normal respiratory effort. Abdomen: soft, distended but much less so Psychiatric: grossly normal mood and affect, speech fluent and appropriate  New data reviewed:  BUN is normalized.  Creatinine improved.  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  TSH WNL  Pending data:  Urine culture   Scheduled Meds: . amLODipine  10 mg Oral Daily  . cefTRIAXone (ROCEPHIN)  IV  1 g Intravenous Q24H  . famotidine (PEPCID) IV  20 mg Intravenous Q24H  . mirtazapine  7.5 mg Oral QHS  . ondansetron (ZOFRAN) IV  4 mg Intravenous 4 times per day  . QUEtiapine  300 mg Oral QHS  .  sulfaSALAzine  500 mg Oral TID   Continuous Infusions: . sodium chloride 1 mL (04/27/15 0421)    Principal Problem:   Nausea without vomiting Active Problems:   Ascites, malignant   Peritoneal  carcinomatosis   Omental mass   Acute kidney injury   UTI (lower urinary tract infection)   Normocytic anemia   IBD (inflammatory bowel disease)   Schizophrenia, unspecified type   Diarrhea   Time spent 15 minutes

## 2015-04-27 NOTE — Consult Note (Signed)
[facility] Consultation Oncology  Name: Latasha [name]      MRN: [id number]    Location: Y403/K742-59  Date: 04/27/2015 Time:11:25 AM   REFERRING PHYSICIAN:  Murray Hodgkins, MD  REASON FOR CONSULT:  Suspected ovarian cancer   DIAGNOSIS:  Ascites with multiple peritoneal based masses within the pelvis measuring up to 5.1 cm.  HISTORY OF PRESENT ILLNESS:   Latasha [name] is a 74 year old black American woman with a past medical history significant for inflammatory bowel disease and schizophrenia who presented to the [facility] ED on 04/25/2015 with nausea.  In the ED, CT abdomen/pelvis demonstrated ascites with multiple peritoneal based masses within the pelvis concerning for ovarian malignancy with peritoneal carcinomatosis.  She was subsequently admitted for further work-up and evaluation.  Additionally, she was noted to have a normocytic, normochromic anemia, elevated BUN/Creratinine suspicious for acute renal failure, possible UTI, elevated LDH, elevated CA 125, and unimpressive anemia panel.  I personally reviewed and went over laboratory results with the patient.  The results are noted within this dictation.  I personally reviewed and went over radiographic studies with the patient.  The results are noted within this dictation.    Chart reviewed.   [name] reports that approximately 2 months ago, she started to note a loss of appetite and a change in taste of foods.  She reports weight loss, but cannot quantify her weight loss.  She reports that when she started to feel poor, she reported to her primary care provider who did lab work (not available to me).  She sees a primary care provider at [facility].  She notes that a few weeks after her lab work, she was called back for repeat lab work and started on oral iron for anemia.  She reports that she was referred to hematology at [facility] since her inflammatory bowel disease was previously followed by [facility].   She notes that she could not get an appt for 2 months and was due to see a hematologist at the end of this month.  She denies any constipation.    She reports that her abdominal swelling began about 1 week ago.  She reports that she is sleeping well in the hospital and her pain is well controlled.  She is G1P1.  PAST MEDICAL HISTORY:   Past Medical History  Diagnosis Date  . Hypertension   . Schizophrenia   . Regional enteritis   . Ulcerative colitis   . Mixed hyperlipidemia   . Vitamin D deficiency   . B12 deficiency   . Iron deficiency anemia     ALLERGIES: No Known Allergies    MEDICATIONS: I have reviewed the patient's current medications.     PAST SURGICAL HISTORY Past Surgical History  Procedure Laterality Date  . Replacement total knee      right knee in 2003    FAMILY HISTORY: Patient has 1 son, Latasha [name].  He has chronic medical issues including DM, heart disease, hypercholesterolemia.  She has never been married.  She is 1 of 10 children.  2 are deceased.  Her mother passed at the age of 47 from CHF.  Father passed in his 42's from cardiac issues.    SOCIAL HISTORY:  reports that she has never smoked. She does not have any smokeless tobacco history on file. She reports that she does not drink alcohol or use illicit drugs.  PERFORMANCE STATUS: The patient's performance status is 2 - Symptomatic, <50% confined to bed  PHYSICAL EXAM: Most Recent Vital Signs: Blood pressure 118/65, pulse 102, temperature 98.1 F (36.7 C), temperature source Oral, resp. rate 18, height 5' 6"  (1.676 m), weight 161 lb 14.4 oz (73.437 kg), SpO2 97 %. General appearance: alert, cooperative, appears stated age, no distress and accompanied by son Latasha [name] Head: Normocephalic, without obvious abnormality, atraumatic Eyes: negative findings: lids and lashes normal, conjunctivae and sclerae normal, corneas clear and pupils equal, round, reactive to light and accomodation Throat: lips, mucosa, and  tongue normal; teeth and gums normal Neck: no adenopathy and supple, symmetrical, trachea midline Back: symmetric, no curvature. ROM normal. No CVA tenderness. Lungs: clear to auscultation bilaterally Heart: regular rate and rhythm, S1, S2 normal, no murmur, click, rub or gallop Abdomen:: bowel sounds normal and no organomegaly. Palpable firmness to the abdominal wall. Abdomen currently soft. (patient is s/p paracentesis) Extremities: extremities normal, atraumatic, no cyanosis or edema Skin: Skin color, texture, turgor normal. No rashes or lesions Lymph nodes: Cervical, supraclavicular, and axillary nodes normal. Neurologic: Alert and oriented X 3, normal strength and tone. Normal symmetric reflexes. Normal coordination and gait  LABORATORY DATA:  Results for orders placed or performed during the hospital encounter of 04/25/15 (from the past 48 hour(s))  Lipase, blood     Status: Abnormal   Collection Time: 04/25/15 12:20 PM  Result Value Ref Range   Lipase 21 (L) 22 - 51 U/L  Vitamin B12     Status: Abnormal   Collection Time: 04/25/15  8:43 PM  Result Value Ref Range   Vitamin B-12 2055 (H) 180 - 914 pg/mL    Comment: (NOTE) This assay is not validated for testing neonatal or myeloproliferative syndrome specimens for Vitamin B12 levels. Performed at [facility]   Folate     Status: None   Collection Time: 04/25/15  8:43 PM  Result Value Ref Range   Folate 39.0 >5.9 ng/mL    Comment: Performed at [facility]  Iron and TIBC     Status: Abnormal   Collection Time: 04/25/15  8:43 PM  Result Value Ref Range   Iron 20 (L) 28 - 170 ug/dL   TIBC 174 (L) 250 - 450 ug/dL   Saturation Ratios 12 10.4 - 31.8 %   UIBC 154 ug/dL    Comment: Performed at [facility]  Ferritin     Status: Abnormal   Collection Time: 04/25/15  8:43 PM  Result Value Ref Range   Ferritin 1066 (H) 11 - 307 ng/mL    Comment: Performed at [facility]  Reticulocytes     Status:  Abnormal   Collection Time: 04/25/15  8:43 PM  Result Value Ref Range   Retic Ct Pct 1.4 0.4 - 3.1 %   RBC. 2.88 (L) 3.87 - 5.11 MIL/uL   Retic Count, Manual 40.3 19.0 - 186.0 K/uL  CA 125     Status: Abnormal   Collection Time: 04/25/15  8:43 PM  Result Value Ref Range   CA 125 3902.0 (H) 0.0 - 38.1 U/mL    Comment: (NOTE) Roche ECLIA methodology Performed At: [facility] [place], [place] [id number] Lindon Romp MD VA:[id number]   TSH     Status: None   Collection Time: 04/25/15  8:43 PM  Result Value Ref Range   TSH 1.990 0.350 - 4.500 uIU/mL  Lactate dehydrogenase     Status: Abnormal   Collection Time: 04/25/15  8:43 PM  Result Value Ref Range   LDH  460 (H) 98 - 192 U/L  C difficile quick scan w PCR reflex     Status: None   Collection Time: 04/26/15  5:53 AM  Result Value Ref Range   C Diff antigen NEGATIVE NEGATIVE   C Diff toxin NEGATIVE NEGATIVE   C Diff interpretation Negative for toxigenic C. difficile   Comprehensive metabolic panel     Status: Abnormal   Collection Time: 04/26/15  6:12 AM  Result Value Ref Range   Sodium 138 135 - 145 mmol/L   Potassium 4.3 3.5 - 5.1 mmol/L   Chloride 109 101 - 111 mmol/L   CO2 22 22 - 32 mmol/L   Glucose, Bld 94 65 - 99 mg/dL   BUN 30 (H) 6 - 20 mg/dL   Creatinine, Ser 1.60 (H) 0.44 - 1.00 mg/dL    Comment: RESULT REPEATED AND VERIFIED DELTA CHECK NOTED    Calcium 8.5 (L) 8.9 - 10.3 mg/dL   Total Protein 6.3 (L) 6.5 - 8.1 g/dL   Albumin 3.0 (L) 3.5 - 5.0 g/dL   AST 35 15 - 41 U/L   ALT 16 14 - 54 U/L   Alkaline Phosphatase 49 38 - 126 U/L   Total Bilirubin 0.4 0.3 - 1.2 mg/dL   GFR calc non Af Amer 31 (L) >60 mL/min   GFR calc Af Amer 36 (L) >60 mL/min    Comment: (NOTE) The eGFR has been calculated using the CKD EPI equation. This calculation has not been validated in all clinical situations. eGFR's persistently <60 mL/min signify possible Chronic Kidney Disease.    Anion gap 7 5 - 15   CBC     Status: Abnormal   Collection Time: 04/26/15  6:12 AM  Result Value Ref Range   WBC 7.4 4.0 - 10.5 K/uL   RBC 2.89 (L) 3.87 - 5.11 MIL/uL   Hemoglobin 8.2 (L) 12.0 - 15.0 g/dL   HCT 26.5 (L) 36.0 - 46.0 %   MCV 91.7 78.0 - 100.0 fL   MCH 28.4 26.0 - 34.0 pg   MCHC 30.9 30.0 - 36.0 g/dL   RDW 14.8 11.5 - 15.5 %   Platelets 386 150 - 400 K/uL  Basic metabolic panel     Status: Abnormal   Collection Time: 04/27/15  6:32 AM  Result Value Ref Range   Sodium 138 135 - 145 mmol/L   Potassium 4.5 3.5 - 5.1 mmol/L   Chloride 110 101 - 111 mmol/L   CO2 21 (L) 22 - 32 mmol/L   Glucose, Bld 100 (H) 65 - 99 mg/dL   BUN 19 6 - 20 mg/dL   Creatinine, Ser 1.34 (H) 0.44 - 1.00 mg/dL   Calcium 8.6 (L) 8.9 - 10.3 mg/dL   GFR calc non Af Amer 39 (L) >60 mL/min   GFR calc Af Amer 45 (L) >60 mL/min    Comment: (NOTE) The eGFR has been calculated using the CKD EPI equation. This calculation has not been validated in all clinical situations. eGFR's persistently <60 mL/min signify possible Chronic Kidney Disease.    Anion gap 7 5 - 15  CBC     Status: Abnormal   Collection Time: 04/27/15  6:32 AM  Result Value Ref Range   WBC 7.8 4.0 - 10.5 K/uL   RBC 2.98 (L) 3.87 - 5.11 MIL/uL   Hemoglobin 8.5 (L) 12.0 - 15.0 g/dL   HCT 27.3 (L) 36.0 - 46.0 %   MCV 91.6 78.0 - 100.0 fL   MCH  28.5 26.0 - 34.0 pg   MCHC 31.1 30.0 - 36.0 g/dL   RDW 14.8 11.5 - 15.5 %   Platelets 414 (H) 150 - 400 K/uL      RADIOGRAPHY: Ct Abdomen Pelvis Wo Contrast  04/25/2015   CLINICAL DATA:  Vomiting and diarrhea for 2 weeks, shortness of breath, dizziness, leg swelling, history hypertension, ulcerative colitis  EXAM: CT ABDOMEN AND PELVIS WITHOUT CONTRAST  TECHNIQUE: Multidetector CT imaging of the abdomen and pelvis was performed following the standard protocol without IV contrast. IV contrast not utilized due to renal dysfunction, creatinine 2.69. Sagittal and coronal MPR images reconstructed from axial data set.  Patient drank dilute oral contrast for exam.  COMPARISON:  None  FINDINGS: Minimal RIGHT pleural effusion.  Within limits of a nonenhanced exam, liver, spleen, pancreas, kidneys, and adrenal glands normal. Normal appendix.  Cystocele present with significant inferior descent of urinary bladder in pelvis.  Extensive ascites.  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  These masses measure up to 5.1 cm in greatest dimension.  Uterus is small and atrophic with multiple calcifications.  Numerous pelvic phleboliths.  Stomach and bowel loops displaced by ascites but otherwise unremarkable.  Upper normal sized to borderline enlarged retroperitoneal nodes without definite adenopathy.  No definite free air, hernia, or acute osseous findings.  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  Cystocele.   Electronically Signed   By: Lavonia Dana M.D.   On: 04/25/2015 15:04       PATHOLOGY:  NONE  ASSESSMENT:  1. Ascites with peritoneal masses suspicious for peritoneal carcinomatosis with an elevated CA 125 of 3902 and left supraclavicular lymphadenopathy and moderate mediastinal adenopathy.  S/P paracentesis on 04/27/2015 relieving 3700 mL of fluid. 2. Normocytic, normochromic anemia.  Vitamin deficiency studies are unimpressive.  3. Acute renal failure.   PLAN:  1. I personally reviewed and went over laboratory results with the patient.  The results are noted within this dictation. 2. I personally reviewed and went over radiographic studies with the patient.  The results are noted within this dictation.   3. Chart reviewed 4. Labs today: CEA 5. Paracentesis ordered with fluid to be sent for cytology 6. CT Chest wo contrast (no contrast due to renal function). 7. Lovenox prophylaxis,  consulted pharmacy, secondary to renal function. 8. Will await cytology report from ascites.   All questions were answered. The patient knows to call the clinic with any problems, questions or concerns. We can certainly see the patient much sooner if necessary.  Patient and plan discussed with Dr. Ancil Linsey and she is in agreement with the aforementioned.   KEFALAS,THOMAS, PA-C 04/27/2015 11:41 AM    As above. Patient seen and examined. I suspect a primary female malignancy. I discussed with the patient and her son that hopefully we can obtain a diagnosis from cytology from paracentesis today. We will arrange for additional CT imaging, noncontrasted given her renal disease.  Anemia is certainly multifactorial, I am not sure of the chronicity of her kidney disease but that certainly could be contributing factor as well.  I would recommend some form of DVT prophylaxis. Given her kidney disease Lovenox may not be optimal. I advised the patient to get out of bed frequently. We will follow up in the a.m. I will also call pathology tomorrow to see  if they believe they can obtain a diagnosis from her ascitic fluid. Additional recommendations regarding additional workup and/or therapy will follow.  Molli Hazard, MD

## 2015-04-27 NOTE — Progress Notes (Signed)
ANTICOAGULATION CONSULT NOTE - Initial Consult  Pharmacy Consult for Lovenox Indication: VTE prophylaxis  No Known Allergies  Patient Measurements: Height: 5' 6"  (167.6 cm) Weight: 161 lb 14.4 oz (73.437 kg) IBW/kg (Calculated) : 59.3 Heparin Dosing Weight:   Vital Signs: Temp: 98 F (36.7 C) (08/08 1307) Temp Source: Oral (08/08 1307) BP: 139/61 mmHg (08/08 1307) Pulse Rate: 114 (08/08 1307)  Labs:  Recent Labs  04/25/15 1110  04/25/15 1114 04/26/15 0612 04/27/15 0632  HGB  --   < > 8.4* 8.2* 8.5*  HCT  --   --  26.8* 26.5* 27.3*  PLT  --   --  411* 386 414*  LABPROT 16.2*  --   --   --   --   INR 1.28  --   --   --   --   CREATININE  --   --  2.69* 1.60* 1.34*  < > = values in this interval not displayed.  Estimated Creatinine Clearance: 39.5 mL/min (by C-G formula based on Cr of 1.34).   Medical History: Past Medical History  Diagnosis Date  . Hypertension   . Schizophrenia   . Regional enteritis   . Ulcerative colitis   . Mixed hyperlipidemia   . Vitamin D deficiency   . B12 deficiency   . Iron deficiency anemia     Medications:  Scheduled:  . amLODipine  10 mg Oral Daily  . cefTRIAXone (ROCEPHIN)  IV  1 g Intravenous Q24H  . enoxaparin (LOVENOX) injection  40 mg Subcutaneous Q24H  . famotidine (PEPCID) IV  20 mg Intravenous Q24H  . mirtazapine  7.5 mg Oral QHS  . ondansetron (ZOFRAN) IV  4 mg Intravenous 4 times per day  . QUEtiapine  300 mg Oral QHS  . sulfaSALAzine  500 mg Oral TID    Assessment: Lovenox dosing for VTE prophylaxis Patient weight 73.4 kg CrCl 39.5 ml/hr Labs reviewed  Goal of Therapy:  VTE prophylaxis dosing Monitor platelets by anticoagulation protocol: Yes   Plan:  Lovenox 40 mg SQ every 24 hours Monitor renal function Monitor CBC, platelets  Allyssa Abruzzese Bennett 04/27/2015,3:35 PM

## 2015-04-28 DIAGNOSIS — E440 Moderate protein-calorie malnutrition: Secondary | ICD-10-CM | POA: Insufficient documentation

## 2015-04-28 LAB — CEA: CEA: 0.5 ng/mL (ref 0.0–4.7)

## 2015-04-28 MED ORDER — BOOST / RESOURCE BREEZE PO LIQD: 1.0000 | Freq: Three times a day (TID) | ORAL | Status: DC

## 2015-04-28 MED ORDER — ONDANSETRON 4 MG PO TBDP: 4.0000 mg | ORAL_TABLET | Freq: Three times a day (TID) | ORAL | Status: DC | PRN

## 2015-04-28 NOTE — Discharge Planning (Signed)
Pt awaiting ride to arrive from work. Plan is to be here between 1500-1530. Pt will be discharged when ride arrives.

## 2015-04-28 NOTE — Care Management Important Message (Signed)
Important Message  Patient Details  Name: Latasha [name] MRN: [id number] Date of Birth: 1941/07/26   Medicare Important Message Given:  Yes-second notification given    Joylene Draft, RN 04/28/2015, 1:33 PM

## 2015-04-28 NOTE — Care Management Note (Signed)
Case Management Note  Patient Details  Name: Latasha [name] MRN: [id number] Date of Birth: [birth year]  Subjective/Objective:                  Pt admitted from home with nausea without vomiting. Pt lives alone and will return home at discharge. Pt has a son that lives next door and a sister who is very active in the care of the pt. Pt is independent with ADL's. Pts PCP is with [facility] in [place].  Action/Plan: Pt for discharge home today. No CM needs noted. Pt has follow up appts made with oncology to continue outpt work up for possible cancer.  Expected Discharge Date:  04/27/15               Expected Discharge Plan:  Home/Self Care  In-House Referral:  NA  Discharge planning Services  CM Consult  Post Acute Care Choice:  NA Choice offered to:  NA  DME Arranged:    DME Agency:     HH Arranged:    HH Agency:     Status of Service:  Completed, signed off  Medicare Important Message Given:  Yes-second notification given Date Medicare IM Given:    Medicare IM give by:    Date Additional Medicare IM Given:    Additional Medicare Important Message give by:     If discussed at [place] of Stay Meetings, dates discussed:    Additional Comments:  Joylene Draft, RN 04/28/2015, 1:33 PM

## 2015-04-28 NOTE — Discharge Planning (Signed)
Pt IV removed. DC papers given, explained and educated.  Pt told of needed FU appts and script at pharm.  VSS and RN assessment revealed stability for discharge. Pt being wheeled to car and family will be transporting home via car.

## 2015-04-28 NOTE — Progress Notes (Signed)
  PROGRESS NOTE  Latasha [name] GBT:[id number] DOB: Mar 20, 1941 DOA: 04/25/2015 PCP: No primary care provider on file.  Summary: 67 yof with a history of inflammatory bowel disease iron deficiency anemia, and schizophrenia presented with complaints of nausea and diarrhea. 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 Admitted for further evaluation and management.  Assessment/Plan: 1. Nausea and diarrhea. No recurrence. Cdiff negative.  Likely secondary to abdominal/pelvic CT findings of omental masses, ascites, and peritoneal carcinomatosis superimposed on known chronic IBD. 2. 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 Oncology evaluation appreciated. Pelvic ultrasound demonstrated masses and ascites but ovaries could not be clearly seen. CT chest suggestive for metastatic lymphadenopathy. 3. AKI, improved. Expect spontaneous resolution. 4. Possible urinary tract infection. Urine culture negative. Antibiotics stopped. 5. Normocytic anemia. Stable. Baseline unknown. Suspect anemia of chronic disease. 6. Schizophrenia, unspecified type.    Appears stable. No nausea or vomiting. No pain.  Discussed with oncology. Home today. They will follow-up in the outpatient setting.  Murray Hodgkins, MD  [facility]  Pager [phone] If 7PM-7AM, please contact night-coverage at [email or web address], password [facility] 04/28/2015, 6:42 PM  LOS: 3 days   Consultants:  Oncology  Procedures:  8/8 paracentesis 3700 ml dark amber colored ascites removed  Antibiotics:  Rocephin 8/6 >> 8/9  HPI/Subjective: Continues to feel better. Tolerating diet.  Objective: Filed Vitals:   04/27/15 1307 04/27/15 2148 04/28/15 0554 04/28/15 1032  BP: 139/61 126/67 138/58 142/64  Pulse: 114 101 107   Temp: 98 F (36.7 C) 98 F (36.7 C) 98.6 F (37  C)   TempSrc: Oral Oral Oral   Resp: 16 20 18    Height:      Weight:   70.171 kg (154 lb 11.2 oz)   SpO2: 92% 91% 95%    No intake or output data in the 24 hours ending 04/28/15 1842   Filed Weights   04/26/15 0551 04/27/15 0457 04/28/15 0554  Weight: 69 kg (152 lb 1.9 oz) 73.437 kg (161 lb 14.4 oz) 70.171 kg (154 lb 11.2 oz)    Exam: General:  Appears calm and comfortable Cardiovascular: RRR, no m/r/g.  Respiratory: CTA bilaterally, no w/r/r. Normal respiratory effort. 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  TSH WNL  Pending data:  Urine culture   Scheduled Meds:  Continuous Infusions:   Principal Problem:   Nausea without vomiting Active Problems:   Ascites, malignant   Peritoneal carcinomatosis   Omental mass   Acute kidney injury   UTI (lower urinary tract infection)   Normocytic anemia   IBD (inflammatory bowel disease)   Schizophrenia, unspecified type   Diarrhea   Malnutrition of moderate degree

## 2015-04-28 NOTE — Discharge Summary (Signed)
Physician Discharge Summary  Latasha [name] JKD:[id number] DOB: Apr 17, 1941 DOA: 04/25/2015  PCP: No primary care provider on file.  Patient has Medicare. She is going to seek a primary care physician. Oncologist: Dr. Whitney Muse  Admit date: 04/25/2015 Discharge date: 04/28/2015  Recommendations for Outpatient Follow-up:  1. Follow up with oncology as outpatient 2. Take Zofran as needed    Follow-up Information    Follow up with KEFALAS,THOMAS, PA-C On 05/04/2015.   Specialty:  Physician Assistant   Why:  at 9:40 am at [facility] on the fourth floor   Contact information:   [address] [place] [address] [phone]       Discharge Diagnoses:  1. 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 Nausea and diarrhea. 3. AKI 4. Normocytic anemia 5. HTN  Discharge Condition: Improved Disposition: Home  Diet recommendation: Heart healthy  Filed Weights   04/26/15 0551 04/27/15 0457 04/28/15 0554  Weight: 69 kg (152 lb 1.9 oz) 73.437 kg (161 lb 14.4 oz) 70.171 kg (154 lb 11.2 oz)    History of present illness:  74 yof with a history of inflammatory bowel disease iron deficiency anemia, and schizophrenia presented with complaints of nausea and diarrhea. 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 Admitted for further evaluation and management.  Hospital Course:  Latasha [name] nausea and vomiting resolved with antiemetics. She underwent therapeutic and diagnostic paracentesis with cytology consistent with metastatic adenocarcinoma. Further evaluation was conducted with ultrasound of the pelvis as well as CT scan of the chest. She was seen in consultation with oncology. Will follow up with oncology as an outpatient. Hospitalization was uncomplicated.   1.   Nausea and diarrhea. No recurrence. Cdiff negative.  Likely secondary to abdominal/pelvic CT findings of omental masses, ascites, and peritoneal carcinomatosis superimposed on known chronic IBD. 2. 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 Oncology evaluation appreciated. Pelvic ultrasound demonstrated masses and ascites but ovaries could not be clearly seen. CT chest suggestive for metastatic lymphadenopathy. 3. AKI, improved. Expect spontaneous resolution. 4. Possible urinary tract infection. Urine culture negative. Antibiotics stopped. 5. Normocytic anemia. Stable. Baseline unknown. Suspect anemia of chronic disease. 6. Schizophrenia, unspecified type.  Consultants:  Oncology  Procedures:  8/8 paracentesis 3700 ml dark amber colored ascites removed  Antibiotics:  Rocephin 8/6 >> 8/9  Discharge Instructions   Discharge Medication List as of 04/28/2015  1:27 PM    START taking these medications   Details  feeding supplement (BOOST / RESOURCE BREEZE) LIQD Take 1 Container by mouth 3 (three) times daily between meals., Starting 04/28/2015, Until Discontinued, No Print    ondansetron (ZOFRAN ODT) 4 MG disintegrating tablet Take 1 tablet (4 mg total) by mouth every 8 (eight) hours as needed for nausea or vomiting., Starting 04/28/2015, Until Discontinued, Normal      CONTINUE these medications which have NOT CHANGED   Details  amLODipine (NORVASC) 10 MG tablet Take 10 mg by mouth daily., Until Discontinued, Historical Med    aspirin EC 81 MG tablet Take 81 mg by mouth daily., Until Discontinued, Historical Med    ferrous sulfate 325 (65 FE) MG EC tablet Take 325 mg by mouth 2 (two) times daily., Until Discontinued, Historical Med    folic acid (FOLVITE) 1 MG tablet Take 1 mg by mouth daily., Until Discontinued, Historical Med    lisinopril-hydrochlorothiazide (PRINZIDE,ZESTORETIC) 20-25 MG per tablet Take 1  tablet by mouth daily., Until Discontinued, Historical Med    lovastatin (MEVACOR) 10 MG  tablet Take 10 mg by mouth at bedtime., Until Discontinued, Historical Med    mirtazapine (REMERON) 15 MG tablet Take 7.5 mg by mouth at bedtime., Until Discontinued, Historical Med    QUEtiapine (SEROQUEL XR) 300 MG 24 hr tablet Take 300 mg by mouth at bedtime., Until Discontinued, Historical Med    sulfaSALAzine (AZULFIDINE) 500 MG tablet Take 500 mg by mouth 3 (three) times daily., Until Discontinued, Historical Med    vitamin B-12 (CYANOCOBALAMIN) 1000 MCG tablet Take 1,000 mcg by mouth daily., Until Discontinued, Historical Med      STOP taking these medications     naproxen (NAPROSYN) 500 MG tablet        No Known Allergies  The results of significant diagnostics from this hospitalization (including imaging, microbiology, ancillary and laboratory) are listed below for reference.    Significant Diagnostic Studies: Ct Abdomen Pelvis Wo Contrast  04/25/2015   CLINICAL DATA:  Vomiting and diarrhea for 2 weeks, shortness of breath, dizziness, leg swelling, history hypertension, ulcerative colitis  EXAM: CT ABDOMEN AND PELVIS WITHOUT CONTRAST  TECHNIQUE: Multidetector CT imaging of the abdomen and pelvis was performed following the standard protocol without IV contrast. IV contrast not utilized due to renal dysfunction, creatinine 2.69. Sagittal and coronal MPR images reconstructed from axial data set. Patient drank dilute oral contrast for exam.  COMPARISON:  None  FINDINGS: Minimal RIGHT pleural effusion.  Within limits of a nonenhanced exam, liver, spleen, pancreas, kidneys, and adrenal glands normal. Normal appendix.  Cystocele present with significant inferior descent of urinary bladder in pelvis.  Extensive ascites.  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  These masses measure up to 5.1 cm in greatest dimension.  Uterus is small and atrophic with multiple calcifications.   Numerous pelvic phleboliths.  Stomach and bowel loops displaced by ascites but otherwise unremarkable.  Upper normal sized to borderline enlarged retroperitoneal nodes without definite adenopathy.  No definite free air, hernia, or acute osseous findings.  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  Cystocele.   Electronically Signed   By: Lavonia Dana M.D.   On: 04/25/2015 15:04   Ct Chest Wo Contrast  04/27/2015   CLINICAL DATA:  74 year old female with peritoneal carcinomatosis.  EXAM: CT CHEST WITHOUT CONTRAST  TECHNIQUE: Multidetector CT imaging of the chest was performed following the standard protocol without IV contrast.  COMPARISON:  CT abdomen/pelvis from 04/25/2015.  FINDINGS: Mediastinum/Nodes: Normal heart size. Mild pericardial fluid/thickening. Mildly atherosclerotic nonaneurysmal thoracic aorta. Normal caliber pulmonary arteries. Low-density cardiac blood pool, indicating anemia. Normal visualized thyroid. Normal esophagus. No axillary lymphadenopathy. Multiple clustered mildly enlarged right pericardial phrenic lymph nodes, largest 1.2 cm short axis (series 2/image 47). Solitary mildly enlarged 1.1 cm left pericardial phrenic node (2/52). Mildly enlarged 1.3 cm prevascular mediastinal node (2/18). Mildly enlarged 1.1 cm subcarinal node (2/29). No gross hilar adenopathy, noting limited sensitivity for the detection of hilar lymphadenopathy on a noncontrast study. There is a suggestion of mild left supraclavicular lymphadenopathy, largest 1.1 cm (2/8).  Lungs/Pleura: Small right and trace left pleural effusions, increased bilaterally. No pneumothorax. Nonspecific 3 mm right vocal cord polyp (3/3). Right middle lobe 3 mm subpleural pulmonary nodule (3/37) and separate 3 mm right middle lobe pulmonary nodule (3/38). Segmental  basilar right lower lobe atelectasis. 3 mm superior  segment left lower lobe pulmonary nodule associated with the left major fissure (3/22). Subsegmental left lower lobe atelectasis.  Upper abdomen: Small volume upper abdominal ascites, decreased. Stable abnormal fat stranding throughout the visualized upper peritoneal cavity, with multiple nodular tumor implants, in keeping with peritoneal carcinomatosis, largest 2.8 cm in the left upper quadrant (2/ 60) .  Musculoskeletal: Mild to moderate degenerative changes in the thoracic spine. No suspicious focal osseous lesions.  IMPRESSION: 1. Mild left supraclavicular lymphadenopathy and moderate mediastinal lymphadenopathy involving the prevascular, subcarinal and bilateral pericardiophrenic nodal chains, likely metastatic. 2. Small right and trace left pleural effusions, increased bilaterally, with associated bibasilar atelectasis. Mild pericardial fluid/thickening. 3. Three tiny pulmonary nodules, largest 3 mm, indeterminate. Recommend follow-up chest CT in 3 months. 4. Decreased small volume upper abdominal ascites. Stable findings of peritoneal carcinomatosis in the visualized upper abdomen. 5. Nonspecific 3 mm right vocal cord polyp. 6. Anemia.   Electronically Signed   By: Ilona Sorrel M.D.   On: 04/27/2015 13:27   US Transvaginal Non-ob  04/27/2015   CLINICAL DATA:  Peritoneal carcinomatosis  EXAM: TRANSABDOMINAL AND TRANSVAGINAL ULTRASOUND OF PELVIS  TECHNIQUE: Both transabdominal and transvaginal ultrasound examinations of the pelvis were performed. Transabdominal technique was performed for global imaging of the pelvis including uterus, ovaries, adnexal regions, and pelvic cul-de-sac. It was necessary to proceed with endovaginal exam following the transabdominal exam to visualize the uterus and adnexa.  COMPARISON:  CT 04/25/2015  FINDINGS: Uterus  Measurements: 7.0 x 2.6 x 4.6 cm. Small uterine calcifications. Fluid in the uterine cavity measuring 2.5 mm in thickness. No definite uterine mass.  Endometrium   Thickness: Small amount of fluid in the uterine cavity. No in and material mass identified. No focal abnormality visualized.  Right ovary  Measurements: . Not identified separate firm adnexal masses.  Left ovary  Measurements: . Not identified separate from adnexal masses.  Other findings  Large amount of free fluid which has internal echoes suggesting blood or possibly tumor or infection. Multiple soft tissue masses throughout the adnexum consistent with peritoneal carcinomatosis.  IMPRESSION: Ascites and multiple peritoneal soft tissue masses compatible with peritoneal tumor. This could be of ovarian etiology but could also be metastatic. Discrete ovaries not identified.  Small amount of free fluid in the uterine cavity. No uterine mass identified.   Electronically Signed   By: Franchot Gallo M.D.   On: 04/27/2015 13:27   US Pelvis Complete  04/27/2015   CLINICAL DATA:  Peritoneal carcinomatosis  EXAM: TRANSABDOMINAL AND TRANSVAGINAL ULTRASOUND OF PELVIS  TECHNIQUE: Both transabdominal and transvaginal ultrasound examinations of the pelvis were performed. Transabdominal technique was performed for global imaging of the pelvis including uterus, ovaries, adnexal regions, and pelvic cul-de-sac. It was necessary to proceed with endovaginal exam following the transabdominal exam to visualize the uterus and adnexa.  COMPARISON:  CT 04/25/2015  FINDINGS: Uterus  Measurements: 7.0 x 2.6 x 4.6 cm. Small uterine calcifications. Fluid in the uterine cavity measuring 2.5 mm in thickness. No definite uterine mass.  Endometrium  Thickness: Small amount of fluid in the uterine cavity. No in and material mass identified. No focal abnormality visualized.  Right ovary  Measurements: . Not identified separate firm adnexal masses.  Left ovary  Measurements: . Not identified separate from adnexal masses.  Other findings  Large amount of free fluid which has internal echoes suggesting blood or possibly tumor or infection. Multiple  soft tissue masses throughout the adnexum consistent  with peritoneal carcinomatosis.  IMPRESSION: Ascites and multiple peritoneal soft tissue masses compatible with peritoneal tumor. This could be of ovarian etiology but could also be metastatic. Discrete ovaries not identified.  Small amount of free fluid in the uterine cavity. No uterine mass identified.   Electronically Signed   By: Franchot Gallo M.D.   On: 04/27/2015 13:27   US Paracentesis  04/27/2015   CLINICAL DATA:  Malignant ascites  EXAM: ULTRASOUND GUIDED  PARACENTESIS  COMPARISON:  CT abdomen pelvis 04/25/2015  PROCEDURE: An ultrasound guided paracentesis was thoroughly discussed with the patient and questions answered. The benefits, risks, alternatives and complications were also discussed. The patient understands and wishes to proceed with the procedure. Written consent was obtained.  Ultrasound was performed to localize and mark an adequate pocket of fluid in the left lower quadrant of the abdomen. The area was then prepped and draped in the normal sterile fashion. 1% Lidocaine was used for local anesthesia. Under ultrasound guidance a 19 gauge Yueh catheter was introduced. Paracentesis was performed. The catheter was removed and a dressing applied.  COMPLICATIONS: None.  FINDINGS: A total of approximately 3700 mL of amber colored fluid was removed. A fluid sample was sent for laboratory analysis.  IMPRESSION: Successful ultrasound guided paracentesis yielding 3700 mL of ascites.   Electronically Signed   By: Franchot Gallo M.D.   On: 04/27/2015 13:03    Microbiology: Recent Results (from the past 240 hour(s))  Urine culture     Status: None   Collection Time: 04/25/15 11:23 AM  Result Value Ref Range Status   Specimen Description URINE, CLEAN CATCH  Final   Special Requests NONE  Final   Culture   Final    NO GROWTH 2 DAYS Performed at [facility]    Report Status 04/27/2015 FINAL  Final  C difficile quick scan w PCR reflex      Status: None   Collection Time: 04/26/15  5:53 AM  Result Value Ref Range Status   C Diff antigen NEGATIVE NEGATIVE Final   C Diff toxin NEGATIVE NEGATIVE Final   C Diff interpretation Negative for toxigenic C. difficile  Final  Gram stain     Status: None   Collection Time: 04/27/15 12:00 PM  Result Value Ref Range Status   Specimen Description FLUID ASCITIC  Final   Special Requests NONE  Final   Gram Stain   Final    CYTOSPIN SMEAR WBC PRESENT,BOTH PMN AND MONONUCLEAR NO ORGANISMS SEEN Performed at [facility]    Report Status 04/27/2015 FINAL  Final  Culture, body fluid-bottle     Status: None (Preliminary result)   Collection Time: 04/27/15 12:00 PM  Result Value Ref Range Status   Specimen Description FLUID ASCITIC COLLECTED BY DOCTOR  Final   Special Requests BOTTLES DRAWN AEROBIC AND ANAEROBIC 10CC EACH  Final   Culture NO GROWTH < 24 HOURS  Final   Report Status PENDING  Incomplete     Labs: Basic Metabolic Panel:  Recent Labs Lab 04/25/15 1114 04/26/15 0612 04/27/15 0632  NA 138 138 138  K 4.4 4.3 4.5  CL 105 109 110  CO2 20* 22 21*  GLUCOSE 93 94 100*  BUN 43* 30* 19  CREATININE 2.69* 1.60* 1.34*  CALCIUM 9.2 8.5* 8.6*   Liver Function Tests:  Recent Labs Lab 04/25/15 1114 04/26/15 0612  AST 40 35  ALT 17 16  ALKPHOS 57 49  BILITOT 0.6 0.4  PROT 7.6 6.3*  ALBUMIN 3.7 3.0*    Recent Labs Lab 04/25/15 1220  LIPASE 21*   CBC:  Recent Labs Lab 04/25/15 1114 04/26/15 0612 04/27/15 0632  WBC 8.3 7.4 7.8  HGB 8.4* 8.2* 8.5*  HCT 26.8* 26.5* 27.3*  MCV 92.1 91.7 91.6  PLT 411* 386 414*    Principal Problem:   Nausea without vomiting Active Problems:   Ascites, malignant   Peritoneal carcinomatosis   Omental mass   Acute kidney injury   UTI (lower urinary tract infection)   Normocytic anemia   IBD (inflammatory bowel disease)   Schizophrenia, unspecified type   Diarrhea   Malnutrition of moderate degree   Time  coordinating discharge: 40 minutes  Signed:  Murray Hodgkins, MD [facility] 04/28/2015, 12:27 PM   I, Salvadore Oxford, acting a scribe, recorded this note contemporaneously in the presence of Dr. Melene Plan. Sarajane Jews, M.D. on 04/28/2015   I have reviewed the above documentation for accuracy and completeness, and I agree with the above. Murray Hodgkins, MD

## 2015-05-02 LAB — CULTURE, BODY FLUID-BOTTLE

## 2015-05-02 LAB — CULTURE, BODY FLUID W GRAM STAIN -BOTTLE: Culture: NO GROWTH

## 2015-05-04 ENCOUNTER — Telehealth (HOSPITAL_COMMUNITY): Payer: Self-pay | Admitting: Radiology

## 2015-05-04 ENCOUNTER — Encounter (HOSPITAL_COMMUNITY): Payer: Self-pay | Admitting: Oncology

## 2015-05-04 ENCOUNTER — Encounter (HOSPITAL_COMMUNITY): Payer: Medicare Other | Attending: Oncology | Admitting: Oncology

## 2015-05-04 VITALS — BP 149/64 | HR 108 | Temp 98.0°F | Resp 18 | Ht 65.5 in | Wt 152.9 lb

## 2015-05-04 DIAGNOSIS — Z79899 Other long term (current) drug therapy: Secondary | ICD-10-CM | POA: Insufficient documentation

## 2015-05-04 DIAGNOSIS — I10 Essential (primary) hypertension: Secondary | ICD-10-CM | POA: Insufficient documentation

## 2015-05-04 DIAGNOSIS — E782 Mixed hyperlipidemia: Secondary | ICD-10-CM | POA: Insufficient documentation

## 2015-05-04 DIAGNOSIS — C771 Secondary and unspecified malignant neoplasm of intrathoracic lymph nodes: Secondary | ICD-10-CM | POA: Insufficient documentation

## 2015-05-04 DIAGNOSIS — E559 Vitamin D deficiency, unspecified: Secondary | ICD-10-CM | POA: Insufficient documentation

## 2015-05-04 DIAGNOSIS — C569 Malignant neoplasm of unspecified ovary: Secondary | ICD-10-CM | POA: Insufficient documentation

## 2015-05-04 DIAGNOSIS — E538 Deficiency of other specified B group vitamins: Secondary | ICD-10-CM | POA: Insufficient documentation

## 2015-05-04 DIAGNOSIS — Z7982 Long term (current) use of aspirin: Secondary | ICD-10-CM | POA: Insufficient documentation

## 2015-05-04 DIAGNOSIS — C786 Secondary malignant neoplasm of retroperitoneum and peritoneum: Secondary | ICD-10-CM | POA: Insufficient documentation

## 2015-05-04 NOTE — Assessment & Plan Note (Addendum)
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  She will need a port placed, and we will order this via IR.  She will likely require neoadjuvant systemic chemotherapy and I have reviewed this information with the patient.  I reviewed her work-up in detail today in the presence of her son.  Additionally, I have printed out her reports from her medical records demonstrating her diagnosis and malignancy work-up thus far.  I have reviewed the patient's case with Joylene John, NP (Gyn Onc).  The patient will be seen as a new patient with Dr. Everitt Amber on Firday, 8/19 at 9:45 AM.  Maybe, she could have her port placed the same day, later in the day, to help save the patient additional travel to [place].  She will need chemotherapy teaching for Carbo/Taxol with Neulasta.  Lupita Raider, our Nurse Navigator has met with the patient to introduce herself.  Return in 2 weeks for nadir check.  We will plan on treating next week.  Treatment plan is developed.  Oncology history developed.  Staging completed.

## 2015-05-04 NOTE — Telephone Encounter (Signed)
Called pt with instructions for portacath placement on 05-11-15 arrive at 11:30 at [facility].  Npo after 0700.  Have driver.  Denies blood thinners and DM.

## 2015-05-04 NOTE — Patient Instructions (Signed)
[place] at [facility] Discharge Instructions  RECOMMENDATIONS MADE BY THE CONSULTANT AND ANY TEST RESULTS WILL BE SENT TO YOUR REFERRING PHYSICIAN.  Exam and discussion by Robynn Pane, PA-C Will get you scheduled for port placement through Interventional Radiology to get you started on chemotherapy. Keep the appointment with Dr. Denman George on Friday as scheduled. Lupita Raider will contact you with the date and time of teaching.  Follow-up to be arranged.  Thank you for choosing [place] at [facility] to provide your oncology and hematology care.  To afford each patient quality time with our provider, please arrive at least 15 minutes before your scheduled appointment time.    You need to re-schedule your appointment should you arrive 10 or more minutes late.  We strive to give you quality time with our providers, and arriving late affects you and other patients whose appointments are after yours.  Also, if you no show three or more times for appointments you may be dismissed from the clinic at the providers discretion.     Again, thank you for choosing [facility].  Our hope is that these requests will decrease the amount of time that you wait before being seen by our physicians.       _____________________________________________________________  Should you have questions after your visit to [facility], please contact our office at [phone] between the hours of 8:30 a.m. and 4:30 p.m.  Voicemails left after 4:30 p.m. will not be returned until the following business day.  For prescription refill requests, have your pharmacy contact our office.

## 2015-05-04 NOTE — Progress Notes (Signed)
No primary care provider on file. No primary provider on file.  Ovarian cancer, unspecified laterality - Plan: Cholecalciferol (VITAMIN D-3) 1000 UNITS CAPS, naproxen (NAPROSYN) 500 MG tablet, IR Fluoro Guide CV Line Left  CURRENT THERAPY: Work-up  INTERVAL HISTORY: Latasha [name] 74 y.o. 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    Ovarian cancer   04/25/2015 - 04/28/2015 Hospital Admission Nausea/diarrhea.  Oncology consult completed on 04/27/2015.   04/25/2015 Tumor Marker CA 125- 3902.      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   04/27/2015 Procedure US Paracentesis- A total of approximately 3700 mL of amber colored fluid was removed. A fluid sample was sent for laboratory analysis.   04/27/2015 Imaging CT Chest- Mild left supraclavicular lymphadenopathy and moderate mediastinal lymphadenopathy involving the prevascular, subcarinal and bilateral pericardiophrenic nodal chains, likely metastatic.   04/27/2015 Pathology Results Diagnosis PERITONEAL/ASCITIC FLUID(SPECIMEN 1 OF 1 COLLECTED 04/27/15): MALIGNANT CELLS CONSISTENT WITH METASTATIC ADENOCARCINOMA.  The immunophenotype is most consistent with a gynecologic primary, most likely ovary.    I personally reviewed and went over laboratory results with the patient.  The results are noted within this dictation.  I personally reviewed and went over radiographic studies with the patient.  The results are noted within this dictation.    I personally reviewed and went over pathology results with the patient.  I reviewed all the details related to her newly diagnosed cancer.  I reviewed furture steps related to her treatment  including port-a-cath placement and consultation with [place].  I have reviewed her upcoming treatment plan briefly.  I did, in no uncertain terms, her disease is incurable, but treatable.  She understands this.  All of this information is reviewed in detail with her son.  Past Medical History  Diagnosis Date  . Hypertension   . Schizophrenia   . Regional enteritis   . Ulcerative colitis   . Mixed hyperlipidemia   . Vitamin D deficiency   . B12 deficiency   . Iron deficiency anemia     has Renal failure; Nausea without vomiting; Ascites, malignant; Ovarian cancer; Omental mass; Acute kidney injury; UTI (lower urinary tract infection); Normocytic anemia; IBD (inflammatory bowel disease); Schizophrenia, unspecified type; Diarrhea; and Malnutrition of moderate degree on her problem list.     has No Known Allergies.  Current Outpatient Prescriptions on File Prior to Visit  Medication Sig Dispense Refill  . amLODipine (NORVASC) 10 MG tablet Take 10 mg by mouth daily.    Marland Kitchen aspirin EC 81 MG tablet Take 81 mg by mouth daily.    . ferrous sulfate 325 (65 FE) MG EC tablet Take 325 mg by mouth 2 (two) times daily.    . folic acid (FOLVITE) 1 MG tablet Take 1 mg by mouth daily.    Marland Kitchen lisinopril-hydrochlorothiazide (PRINZIDE,ZESTORETIC) 20-25 MG per tablet Take 1 tablet by mouth daily.    Marland Kitchen lovastatin (MEVACOR) 10 MG tablet Take 10 mg by mouth at bedtime.    . mirtazapine (REMERON) 15 MG tablet Take 7.5 mg by mouth at bedtime.    . ondansetron (ZOFRAN ODT) 4 MG disintegrating tablet Take 1 tablet (4 mg total) by mouth every 8 (eight) hours as needed for nausea or vomiting. 30 tablet 0  . QUEtiapine (SEROQUEL  XR) 300 MG 24 hr tablet Take 300 mg by mouth at bedtime.    . sulfaSALAzine (AZULFIDINE) 500 MG tablet Take 500 mg by mouth 3 (three) times daily.    . vitamin B-12 (CYANOCOBALAMIN) 1000 MCG tablet Take 1,000 mcg by mouth daily.    . feeding supplement (BOOST / RESOURCE BREEZE) LIQD Take 1  Container by mouth 3 (three) times daily between meals. (Patient not taking: Reported on 05/04/2015)  0   No current facility-administered medications on file prior to visit.    Past Surgical History  Procedure Laterality Date  . Replacement total knee      right knee in 2003  . Paracentesis  04/27/15    Denies any headaches, dizziness, double vision, fevers, chills, night sweats, nausea, vomiting, diarrhea, constipation, chest pain, heart palpitations, shortness of breath, blood in stool, black tarry stool, urinary pain, urinary burning, urinary frequency, hematuria.   PHYSICAL EXAMINATION  ECOG PERFORMANCE STATUS: 1 - Symptomatic but completely ambulatory  Filed Vitals:   05/04/15 0947  BP: 149/64  Pulse: 108  Temp: 98 F (36.7 C)  Resp: 18    GENERAL:alert, no distress, well nourished, well developed, comfortable, cooperative, smiling and accompanied by her son, Jeneen Rinks. SKIN: skin color, texture, turgor are normal, no rashes or significant lesions HEAD: Normocephalic, No masses, lesions, tenderness or abnormalities EYES: normal, PERRLA, EOMI, Conjunctiva are pink and non-injected EARS: External ears normal OROPHARYNX:lips, buccal mucosa, and tongue normal and mucous membranes are moist  NECK: supple, trachea midline LYMPH:  not examined BREAST:not examined LUNGS: not examined HEART: not examined ABDOMEN:abdomen soft and non-tender BACK: Back symmetric, no curvature. EXTREMITIES:less then 2 second capillary refill, no joint deformities, effusion, or inflammation, no skin discoloration, positive findings:  edema B/L LE edema 2+ pitting. NEURO: alert & oriented x 3 with fluent speech, no focal motor/sensory deficits, gait normal   LABORATORY DATA: CBC    Component Value Date/Time   WBC 7.8 04/27/2015 0632   RBC 2.98* 04/27/2015 0632   RBC 2.88* 04/25/2015 2043   HGB 8.5* 04/27/2015 0632   HCT 27.3* 04/27/2015 0632   PLT 414* 04/27/2015 0632   MCV 91.6 04/27/2015 0632    MCH 28.5 04/27/2015 0632   MCHC 31.1 04/27/2015 0632   RDW 14.8 04/27/2015 0632      Chemistry      Component Value Date/Time   NA 138 04/27/2015 0632   K 4.5 04/27/2015 0632   CL 110 04/27/2015 0632   CO2 21* 04/27/2015 0632   BUN 19 04/27/2015 0632   CREATININE 1.34* 04/27/2015 0632      Component Value Date/Time   CALCIUM 8.6* 04/27/2015 0632   ALKPHOS 49 04/26/2015 0612   AST 35 04/26/2015 0612   ALT 16 04/26/2015 0612   BILITOT 0.4 04/26/2015 0612     Lab Results  Component Value Date   CA125 3902.0* 04/25/2015    Lab Results  Component Value Date   CEA 0.5 04/26/2015    PENDING LABS:   RADIOGRAPHIC STUDIES:  Ct Abdomen Pelvis Wo Contrast  04/25/2015   CLINICAL DATA:  Vomiting and diarrhea for 2 weeks, shortness of breath, dizziness, leg swelling, history hypertension, ulcerative colitis  EXAM: CT ABDOMEN AND PELVIS WITHOUT CONTRAST  TECHNIQUE: Multidetector CT imaging of the abdomen and pelvis was performed following the standard protocol without IV contrast. IV contrast not utilized due to renal dysfunction, creatinine 2.69. Sagittal and coronal MPR images reconstructed from axial data set. Patient drank dilute oral contrast for exam.  COMPARISON:  None  FINDINGS: Minimal RIGHT pleural effusion.  Within limits of a nonenhanced exam, liver, spleen, pancreas, kidneys, and adrenal glands normal. Normal appendix.  Cystocele present with significant inferior descent of urinary bladder in pelvis.  Extensive ascites.  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  These masses measure up to 5.1 cm in greatest dimension.  Uterus is small and atrophic with multiple calcifications.  Numerous pelvic phleboliths.  Stomach and bowel loops displaced by ascites but otherwise unremarkable.  Upper normal sized to borderline enlarged retroperitoneal nodes without definite  adenopathy.  No definite free air, hernia, or acute osseous findings.  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  Cystocele.   Electronically Signed   By: Lavonia Dana M.D.   On: 04/25/2015 15:04   Ct Chest Wo Contrast  04/27/2015   CLINICAL DATA:  74 year old female with peritoneal carcinomatosis.  EXAM: CT CHEST WITHOUT CONTRAST  TECHNIQUE: Multidetector CT imaging of the chest was performed following the standard protocol without IV contrast.  COMPARISON:  CT abdomen/pelvis from 04/25/2015.  FINDINGS: Mediastinum/Nodes: Normal heart size. Mild pericardial fluid/thickening. Mildly atherosclerotic nonaneurysmal thoracic aorta. Normal caliber pulmonary arteries. Low-density cardiac blood pool, indicating anemia. Normal visualized thyroid. Normal esophagus. No axillary lymphadenopathy. Multiple clustered mildly enlarged right pericardial phrenic lymph nodes, largest 1.2 cm short axis (series 2/image 47). Solitary mildly enlarged 1.1 cm left pericardial phrenic node (2/52). Mildly enlarged 1.3 cm prevascular mediastinal node (2/18). Mildly enlarged 1.1 cm subcarinal node (2/29). No gross hilar adenopathy, noting limited sensitivity for the detection of hilar lymphadenopathy on a noncontrast study. There is a suggestion of mild left supraclavicular lymphadenopathy, largest 1.1 cm (2/8).  Lungs/Pleura: Small right and trace left pleural effusions, increased bilaterally. No pneumothorax. Nonspecific 3 mm right vocal cord polyp (3/3). Right middle lobe 3 mm subpleural pulmonary nodule (3/37) and separate 3 mm right middle lobe pulmonary nodule (3/38). Segmental basilar right lower lobe atelectasis. 3 mm superior segment left lower lobe pulmonary nodule associated with the left major fissure (3/22). Subsegmental left lower lobe atelectasis.  Upper abdomen: Small volume upper abdominal ascites,  decreased. Stable abnormal fat stranding throughout the visualized upper peritoneal cavity, with multiple nodular tumor implants, in keeping with peritoneal carcinomatosis, largest 2.8 cm in the left upper quadrant (2/ 60) .  Musculoskeletal: Mild to moderate degenerative changes in the thoracic spine. No suspicious focal osseous lesions.  IMPRESSION: 1. Mild left supraclavicular lymphadenopathy and moderate mediastinal lymphadenopathy involving the prevascular, subcarinal and bilateral pericardiophrenic nodal chains, likely metastatic. 2. Small right and trace left pleural effusions, increased bilaterally, with associated bibasilar atelectasis. Mild pericardial fluid/thickening. 3. Three tiny pulmonary nodules, largest 3 mm, indeterminate. Recommend follow-up chest CT in 3 months. 4. Decreased small volume upper abdominal ascites. Stable findings of peritoneal carcinomatosis in the visualized upper abdomen. 5. Nonspecific 3 mm right vocal cord polyp. 6. Anemia.   Electronically Signed   By: Ilona Sorrel M.D.   On: 04/27/2015 13:27   US Transvaginal Non-ob  04/27/2015   CLINICAL DATA:  Peritoneal carcinomatosis  EXAM: TRANSABDOMINAL AND TRANSVAGINAL ULTRASOUND OF PELVIS  TECHNIQUE: Both transabdominal and transvaginal ultrasound examinations of the pelvis were performed. Transabdominal technique was performed for global imaging of the pelvis including uterus, ovaries, adnexal regions, and pelvic cul-de-sac. It was necessary to proceed with endovaginal exam following the transabdominal exam  to visualize the uterus and adnexa.  COMPARISON:  CT 04/25/2015  FINDINGS: Uterus  Measurements: 7.0 x 2.6 x 4.6 cm. Small uterine calcifications. Fluid in the uterine cavity measuring 2.5 mm in thickness. No definite uterine mass.  Endometrium  Thickness: Small amount of fluid in the uterine cavity. No in and material mass identified. No focal abnormality visualized.  Right ovary  Measurements: . Not identified separate firm  adnexal masses.  Left ovary  Measurements: . Not identified separate from adnexal masses.  Other findings  Large amount of free fluid which has internal echoes suggesting blood or possibly tumor or infection. Multiple soft tissue masses throughout the adnexum consistent with peritoneal carcinomatosis.  IMPRESSION: Ascites and multiple peritoneal soft tissue masses compatible with peritoneal tumor. This could be of ovarian etiology but could also be metastatic. Discrete ovaries not identified.  Small amount of free fluid in the uterine cavity. No uterine mass identified.   Electronically Signed   By: Franchot Gallo M.D.   On: 04/27/2015 13:27   US Pelvis Complete  04/27/2015   CLINICAL DATA:  Peritoneal carcinomatosis  EXAM: TRANSABDOMINAL AND TRANSVAGINAL ULTRASOUND OF PELVIS  TECHNIQUE: Both transabdominal and transvaginal ultrasound examinations of the pelvis were performed. Transabdominal technique was performed for global imaging of the pelvis including uterus, ovaries, adnexal regions, and pelvic cul-de-sac. It was necessary to proceed with endovaginal exam following the transabdominal exam to visualize the uterus and adnexa.  COMPARISON:  CT 04/25/2015  FINDINGS: Uterus  Measurements: 7.0 x 2.6 x 4.6 cm. Small uterine calcifications. Fluid in the uterine cavity measuring 2.5 mm in thickness. No definite uterine mass.  Endometrium  Thickness: Small amount of fluid in the uterine cavity. No in and material mass identified. No focal abnormality visualized.  Right ovary  Measurements: . Not identified separate firm adnexal masses.  Left ovary  Measurements: . Not identified separate from adnexal masses.  Other findings  Large amount of free fluid which has internal echoes suggesting blood or possibly tumor or infection. Multiple soft tissue masses throughout the adnexum consistent with peritoneal carcinomatosis.  IMPRESSION: Ascites and multiple peritoneal soft tissue masses compatible with peritoneal tumor.  This could be of ovarian etiology but could also be metastatic. Discrete ovaries not identified.  Small amount of free fluid in the uterine cavity. No uterine mass identified.   Electronically Signed   By: Franchot Gallo M.D.   On: 04/27/2015 13:27   US Paracentesis  04/27/2015   CLINICAL DATA:  Malignant ascites  EXAM: ULTRASOUND GUIDED  PARACENTESIS  COMPARISON:  CT abdomen pelvis 04/25/2015  PROCEDURE: An ultrasound guided paracentesis was thoroughly discussed with the patient and questions answered. The benefits, risks, alternatives and complications were also discussed. The patient understands and wishes to proceed with the procedure. Written consent was obtained.  Ultrasound was performed to localize and mark an adequate pocket of fluid in the left lower quadrant of the abdomen. The area was then prepped and draped in the normal sterile fashion. 1% Lidocaine was used for local anesthesia. Under ultrasound guidance a 19 gauge Yueh catheter was introduced. Paracentesis was performed. The catheter was removed and a dressing applied.  COMPLICATIONS: None.  FINDINGS: A total of approximately 3700 mL of amber colored fluid was removed. A fluid sample was sent for laboratory analysis.  IMPRESSION: Successful ultrasound guided paracentesis yielding 3700 mL of ascites.   Electronically Signed   By: Franchot Gallo M.D.   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  She will need a port placed, and we will order this via IR.  She will likely require neoadjuvant systemic chemotherapy and I have reviewed this information with the patient.  I reviewed her work-up in detail today in the presence of her son.  Additionally, I have printed out her reports from her medical records demonstrating her diagnosis and malignancy work-up thus far.  I have  reviewed the patient's case with Joylene John, NP (Gyn Onc).  The patient will be seen as a new patient with Dr. Everitt Amber on Firday, 8/19 at 9:45 AM.  Maybe, she could have her port placed the same day, later in the day, to help save the patient additional travel to [place].  She will need chemotherapy teaching for Carbo/Taxol with Neulasta.  Lupita Raider, our Nurse Navigator has met with the patient to introduce herself.  Return in 2 weeks for nadir check.  We will plan on treating next week.  Treatment plan is developed.  Oncology history developed.  Staging completed.    THERAPY PLAN:  We will plan on treating next week with Carbo/Taxol.  She will be seen by Dr. Denman George on Friday for future debulking surgery consideration.  Given her metastatic disease radiographically she will need systemic treatment up-front.  All questions were answered. The patient knows to call the clinic with any problems, questions or concerns. We can certainly see the patient much sooner if necessary.  Patient and plan discussed with Dr. Ancil Linsey and she is in agreement with the aforementioned.   This note is electronically signed by: Doy Mince 05/04/2015 12:38 PM

## 2015-05-07 MED ORDER — ONDANSETRON HCL 8 MG PO TABS: 8.0000 mg | ORAL_TABLET | Freq: Three times a day (TID) | ORAL | Status: DC | PRN

## 2015-05-07 MED ORDER — PROCHLORPERAZINE MALEATE 10 MG PO TABS: 10.0000 mg | ORAL_TABLET | Freq: Four times a day (QID) | ORAL | Status: DC | PRN

## 2015-05-07 MED ORDER — DEXAMETHASONE 4 MG PO TABS: ORAL_TABLET | ORAL | Status: DC

## 2015-05-07 MED ORDER — LIDOCAINE-PRILOCAINE 2.5-2.5 % EX CREA: TOPICAL_CREAM | CUTANEOUS | Status: DC

## 2015-05-08 ENCOUNTER — Other Ambulatory Visit: Payer: Self-pay | Admitting: Physician Assistant

## 2015-05-08 ENCOUNTER — Encounter: Payer: Self-pay | Admitting: Gynecologic Oncology

## 2015-05-08 ENCOUNTER — Ambulatory Visit: Payer: Medicare Other | Attending: Gynecologic Oncology | Admitting: Gynecologic Oncology

## 2015-05-08 VITALS — BP 149/70 | HR 106 | Temp 98.2°F | Resp 18 | Ht 65.5 in | Wt 150.7 lb

## 2015-05-08 DIAGNOSIS — C786 Secondary malignant neoplasm of retroperitoneum and peritoneum: Secondary | ICD-10-CM | POA: Insufficient documentation

## 2015-05-08 DIAGNOSIS — C801 Malignant (primary) neoplasm, unspecified: Secondary | ICD-10-CM | POA: Insufficient documentation

## 2015-05-08 DIAGNOSIS — C569 Malignant neoplasm of unspecified ovary: Secondary | ICD-10-CM | POA: Diagnosis not present

## 2015-05-08 NOTE — Patient Instructions (Signed)
Plan to follow up with Dr. Whitney Muse as scheduled.  Plan to follow up with Dr. Denman George after the completion of your third cycle of chemotherapy for consideration of surgery at that time.  Please call for any questions or concerns.

## 2015-05-08 NOTE — Progress Notes (Signed)
Consult Note: Gyn-Onc  Consult was requested by Dr. Whitney Muse for the evaluation of Latasha [name] 74 y.o. female with ovarian cancer  CC:  Chief Complaint  Patient presents with  . peritoneal carcinomatosis    new consult    Assessment/Plan:  Latasha [name]  is a 74 y.o.  year old with stage IVB ovarian cancer, with peritoneal carcinomatosis and pulmonary nodal metastases (clinical) on chest CT, confirmed on cytology of the peritoneal ascites.  I discussed with Ms Rohlman that the goal of therapy with this cancer is combination chemotherapy and surgery. Sequencing of therapies is variable, however, surgical effort should be timed so that it is likely to be an optimal cytoreduction to <1cm residual disease. At this time with pulmonary metastases and disease encroaching the rectum on clinical exam, I feel that she would not be rendered optimally cytoreduced if upfront surgery were performed, and a surgery would likely require a rectosigmoid resection.  I am instead recommending neoadjuvant chemotherapy with 3 cycles of paclitaxel 161m/m2 and carboplatin AUC6. We will see her back soon after (within 1 week) of administration of cycle 3 in order to assess imaging and physical examination. If she has had a complete response in her chest, and significant response in the peritoneal cavity ,we would pause chemotherapy for an interval debulking procedure. If the CT shows incomplete response in the chest, I would recommend additional cycles before pausing for surgery. She understands that the role of surgery is not curative, but only to aid in optimizing the likelihood of a complete response to chemotherapy.  I discussed with Elisabetta that we recommend genetic screening for all patients with epithelial ovarian cancer to detect for familial predisposition to breast/ovarian cancer and to potentially serve to direct therapeutic choices (eg PARP inhibitor therapy).  HPI: Latasha [name] a very  pleasant 74year old G1 who is seen in consultation at the request of Dr PWhitney Musefor (clinical) stage IVB ovarian cancer. The patient developed symptoms of progressive abdominal fullness and discomfort over the course of several months. [facility] [place] [facility] for diarrhea, nausea and emesis. Imaging on 04/25/15 revealed large volume ascites, carcinomatosis and peritoneal masses measuring up to 5.1cm. The patient underwent paracentesis with cytology on 04/27/15 which revealed metastatic adenocarcinoma consistent on immunostains with gyn primary.  CT of the chest on 04/27/15 revealed mild left supraclavicular lymphadenopathy, and mediastinal lymphadenopathy consistent with metastatic disease. There were <316mpulmonary nodules also seen which were indeterminant. There were trace pleural effusions seen.   CA 125 on 04/25/15: 3902.  She is otherwise fairly well. She has hypertension and anemia. She has had no prior abdominal surgeries. Her brother has a history of prostate cancer, but otherwise no first degree relatives with malignancy.  Current Meds:  Outpatient Encounter Prescriptions as of 05/08/2015  Medication Sig  . amLODipine (NORVASC) 10 MG tablet Take 10 mg by mouth daily.  . Marland Kitchenspirin EC 81 MG tablet Take 81 mg by mouth daily.  . Marland KitchenARBOPLATIN IV Inject into the vein every 21 ( twenty-one) days. To start 05/13/15  . Cholecalciferol (VITAMIN D-3) 1000 UNITS CAPS Take 1 capsule by mouth daily.  . Marland Kitchenexamethasone (DECADRON) 4 MG tablet The day before chemo take 5 tablets (2044min the am and 5 tablets (40m70mn the pm. The morning of chemo take 5 tablets (40mg37m. ferrous sulfate 325 (65 FE) MG EC tablet Take 325 mg by  mouth 2 (two) times daily.  . folic acid (FOLVITE) 1 MG tablet Take 1 mg by mouth daily.  Marland Kitchen lidocaine-prilocaine (EMLA) cream Apply a quarter size amount to port site 1 hour prior to chemo. Do not rub in. Cover with  plastic wrap.  . lisinopril-hydrochlorothiazide (PRINZIDE,ZESTORETIC) 20-25 MG per tablet Take 1 tablet by mouth daily.  Marland Kitchen lovastatin (MEVACOR) 10 MG tablet Take 10 mg by mouth at bedtime.  . mirtazapine (REMERON) 15 MG tablet Take 7.5 mg by mouth at bedtime.  . naproxen (NAPROSYN) 500 MG tablet Take 500 mg by mouth 2 (two) times daily as needed.  . ondansetron (ZOFRAN ODT) 4 MG disintegrating tablet Take 1 tablet (4 mg total) by mouth every 8 (eight) hours as needed for nausea or vomiting.  . ondansetron (ZOFRAN) 8 MG tablet Take 1 tablet (8 mg total) by mouth every 8 (eight) hours as needed for nausea or vomiting.  Marland Kitchen PACLitaxel (TAXOL IV) Inject into the vein every 21 ( twenty-one) days. To start 05/13/15  . Pegfilgrastim (NEULASTA ONPRO [place]) Inject into the skin every 21 ( twenty-one) days. To be applied to skin at the end of chemo.  . prochlorperazine (COMPAZINE) 10 MG tablet Take 1 tablet (10 mg total) by mouth every 6 (six) hours as needed (Nausea or vomiting).  . QUEtiapine (SEROQUEL XR) 300 MG 24 hr tablet Take 300 mg by mouth at bedtime.  . sulfaSALAzine (AZULFIDINE) 500 MG tablet Take 500 mg by mouth 3 (three) times daily.  . vitamin B-12 (CYANOCOBALAMIN) 1000 MCG tablet Take 1,000 mcg by mouth daily.  . feeding supplement (BOOST / RESOURCE BREEZE) LIQD Take 1 Container by mouth 3 (three) times daily between meals. (Patient not taking: Reported on 05/04/2015)   No facility-administered encounter medications on file as of 05/08/2015.    Allergy: No Known Allergies  Social Hx:   Social History   Social History  . Marital Status: Single    Spouse Name: N/A  . Number of Children: N/A  . Years of Education: N/A   Occupational History  . Not on file.   Social History Main Topics  . Smoking status: Never Smoker   . Smokeless tobacco: Never Used  . Alcohol Use: No  . Drug Use: No  . Sexual Activity: Not on file   Other Topics Concern  . Not on file   Social History Narrative     Past Surgical Hx:  Past Surgical History  Procedure Laterality Date  . Replacement total knee      right knee in 2003  . Paracentesis  04/27/15    Past Medical Hx:  Past Medical History  Diagnosis Date  . Hypertension   . Schizophrenia   . Regional enteritis   . Ulcerative colitis   . Mixed hyperlipidemia   . Vitamin D deficiency   . B12 deficiency   . Iron deficiency anemia     Past Gynecological History:  SVD x 1  No LMP recorded. Patient is postmenopausal.  Family Hx:  Family History  Problem Relation Age of Onset  . Prostate cancer Brother   . Cancer Paternal Uncle   . Lung cancer Maternal Grandmother   . Hypertension Maternal Grandfather     Review of Systems:  Constitutional  Feels well,    ENT Normal appearing ears and nares bilaterally Skin/Breast  No rash, sores, jaundice, itching, dryness Cardiovascular  No chest pain, shortness of breath, or edema  Pulmonary  No cough or wheeze.  Gastro Intestinal  +  nausea, vomitting, or diarrhoea. No bright red blood per rectum, no abdominal pain, change in bowel movement, or constipation.  Genito Urinary  No frequency, urgency, dysuria, no postmenopausal bleeding Musculo Skeletal  No myalgia, arthralgia, joint swelling or pain  Neurologic  No weakness, numbness, change in gait,  Psychology  No depression, anxiety, insomnia.   Vitals:  Blood pressure 149/70, pulse 106, temperature 98.2 F (36.8 C), temperature source Oral, resp. rate 18, height 5' 5.5" (1.664 m), weight 150 lb 11.2 oz (68.357 kg), SpO2 100 %.  Physical Exam: WD in NAD Neck  Supple NROM, without any enlargements.  Lymph Node Survey No cervical supraclavicular or inguinal adenopathy Cardiovascular  Pulse normal rate, regularity and rhythm. S1 and S2 normal.  Lungs  Clear to auscultation bilateraly, without wheezes/crackles/rhonchi. Good air movement.  Skin  No rash/lesions/breakdown  Psychiatry  Alert and oriented to person, place,  and time  Abdomen  Normoactive bowel sounds, abdomen soft, mildly distended, non-tender and thin without evidence of hernia.  Back No CVA tenderness Genito Urinary  Vulva/vagina: Normal external female genitalia.  No lesions. No discharge or bleeding.  Bladder/urethra:  No lesions or masses, well supported bladder  Vagina: normal with some prolapse and distension from peritoneal ascites.  Cervix: Normal appearing, no lesions.  Uterus: Small, mobile, no parametrial involvement or nodularity.  Adnexa: multiple palpable cystic and fixed masses in the pelvic cul de sac. Rectal  + cul de sac nodularity with a right sided cystic mass adherent to the rectum.  Extremities  No bilateral cyanosis, clubbing or edema.   [name], MD   05/08/2015, 12:54 PM

## 2015-05-11 ENCOUNTER — Other Ambulatory Visit (HOSPITAL_COMMUNITY): Payer: Self-pay | Admitting: Oncology

## 2015-05-11 ENCOUNTER — Encounter (HOSPITAL_COMMUNITY): Payer: Self-pay

## 2015-05-11 ENCOUNTER — Other Ambulatory Visit: Payer: Self-pay | Admitting: Radiology

## 2015-05-11 ENCOUNTER — Ambulatory Visit (HOSPITAL_COMMUNITY)
Admission: RE | Admit: 2015-05-11 | Discharge: 2015-05-11 | Disposition: A | Discharge status: Home / Self Care | Payer: Medicare Other | Source: Ambulatory Visit | Attending: Oncology | Admitting: Oncology

## 2015-05-11 DIAGNOSIS — K5190 Ulcerative colitis, unspecified, without complications: Secondary | ICD-10-CM | POA: Insufficient documentation

## 2015-05-11 DIAGNOSIS — F209 Schizophrenia, unspecified: Secondary | ICD-10-CM | POA: Diagnosis present

## 2015-05-11 DIAGNOSIS — Z8249 Family history of ischemic heart disease and other diseases of the circulatory system: Secondary | ICD-10-CM

## 2015-05-11 DIAGNOSIS — Z79899 Other long term (current) drug therapy: Secondary | ICD-10-CM | POA: Insufficient documentation

## 2015-05-11 DIAGNOSIS — T502X5A Adverse effect of carbonic-anhydrase inhibitors, benzothiadiazides and other diuretics, initial encounter: Secondary | ICD-10-CM | POA: Diagnosis present

## 2015-05-11 DIAGNOSIS — E782 Mixed hyperlipidemia: Secondary | ICD-10-CM | POA: Insufficient documentation

## 2015-05-11 DIAGNOSIS — C771 Secondary and unspecified malignant neoplasm of intrathoracic lymph nodes: Secondary | ICD-10-CM | POA: Insufficient documentation

## 2015-05-11 DIAGNOSIS — E538 Deficiency of other specified B group vitamins: Secondary | ICD-10-CM

## 2015-05-11 DIAGNOSIS — C786 Secondary malignant neoplasm of retroperitoneum and peritoneum: Secondary | ICD-10-CM

## 2015-05-11 DIAGNOSIS — C569 Malignant neoplasm of unspecified ovary: Secondary | ICD-10-CM

## 2015-05-11 DIAGNOSIS — E559 Vitamin D deficiency, unspecified: Secondary | ICD-10-CM | POA: Insufficient documentation

## 2015-05-11 DIAGNOSIS — I10 Essential (primary) hypertension: Secondary | ICD-10-CM | POA: Insufficient documentation

## 2015-05-11 DIAGNOSIS — D630 Anemia in neoplastic disease: Secondary | ICD-10-CM | POA: Diagnosis present

## 2015-05-11 DIAGNOSIS — Z7982 Long term (current) use of aspirin: Secondary | ICD-10-CM

## 2015-05-11 DIAGNOSIS — E860 Dehydration: Secondary | ICD-10-CM | POA: Diagnosis present

## 2015-05-11 DIAGNOSIS — N179 Acute kidney failure, unspecified: Principal | ICD-10-CM | POA: Diagnosis present

## 2015-05-11 DIAGNOSIS — Z801 Family history of malignant neoplasm of trachea, bronchus and lung: Secondary | ICD-10-CM

## 2015-05-11 HISTORY — DX: Malignant neoplasm of unspecified ovary: C56.9

## 2015-05-11 LAB — PROTIME-INR
INR: 1.2 (ref 0.00–1.49)
PROTHROMBIN TIME: 15.4 s — AB (ref 11.6–15.2)

## 2015-05-11 LAB — CBC
HEMATOCRIT: 25.5 % — AB (ref 36.0–46.0)
HEMOGLOBIN: 8 g/dL — AB (ref 12.0–15.0)
MCH: 28.7 pg (ref 26.0–34.0)
MCHC: 31.4 g/dL (ref 30.0–36.0)
MCV: 91.4 fL (ref 78.0–100.0)
Platelets: 534 10*3/uL — ABNORMAL HIGH (ref 150–400)
RBC: 2.79 MIL/uL — ABNORMAL LOW (ref 3.87–5.11)
RDW: 15.2 % (ref 11.5–15.5)
WBC: 8.8 10*3/uL (ref 4.0–10.5)

## 2015-05-11 LAB — APTT: APTT: 35 s (ref 24–37)

## 2015-05-11 MED ORDER — SODIUM CHLORIDE 0.9 % IV SOLN
INTRAVENOUS | Status: DC
  Administered 2015-05-11: 500 mL via INTRAVENOUS

## 2015-05-11 MED ORDER — CEFAZOLIN SODIUM-DEXTROSE 2-3 GM-% IV SOLR
INTRAVENOUS | Status: AC
  Filled 2015-05-11: qty 50

## 2015-05-11 MED ORDER — CEFAZOLIN SODIUM-DEXTROSE 2-3 GM-% IV SOLR
2.0000 g | Freq: Once | INTRAVENOUS | Status: AC
  Administered 2015-05-11: 2 g via INTRAVENOUS

## 2015-05-11 MED ORDER — HEPARIN SOD (PORK) LOCK FLUSH 100 UNIT/ML IV SOLN
INTRAVENOUS | Status: AC | PRN
  Administered 2015-05-11: 500 [IU]

## 2015-05-11 MED ORDER — FENTANYL CITRATE (PF) 100 MCG/2ML IJ SOLN
INTRAMUSCULAR | Status: AC | PRN
  Administered 2015-05-11: 25 ug via INTRAVENOUS
  Administered 2015-05-11: 50 ug via INTRAVENOUS

## 2015-05-11 MED ORDER — HEPARIN SOD (PORK) LOCK FLUSH 100 UNIT/ML IV SOLN
INTRAVENOUS | Status: AC
  Filled 2015-05-11: qty 5

## 2015-05-11 MED ORDER — LIDOCAINE HCL 1 % IJ SOLN
INTRAMUSCULAR | Status: AC
  Filled 2015-05-11: qty 20

## 2015-05-11 MED ORDER — MIDAZOLAM HCL 2 MG/2ML IJ SOLN
INTRAMUSCULAR | Status: AC
  Filled 2015-05-11: qty 4

## 2015-05-11 MED ORDER — FENTANYL CITRATE (PF) 100 MCG/2ML IJ SOLN
INTRAMUSCULAR | Status: AC
  Filled 2015-05-11: qty 2

## 2015-05-11 MED ORDER — LIDOCAINE-EPINEPHRINE 2 %-1:100000 IJ SOLN
INTRAMUSCULAR | Status: AC
  Filled 2015-05-11: qty 1

## 2015-05-11 MED ORDER — MIDAZOLAM HCL 2 MG/2ML IJ SOLN
INTRAMUSCULAR | Status: AC | PRN
  Administered 2015-05-11: 0.5 mg via INTRAVENOUS
  Administered 2015-05-11: 1 mg via INTRAVENOUS
  Administered 2015-05-11: 0.5 mg via INTRAVENOUS

## 2015-05-11 NOTE — H&P (Signed)
Chief Complaint: Patient was seen in consultation today for port a cath placement   Referring Physician(s): Kefalas,Thomas S  History of Present Illness: Latasha [name] is a 74 y.o. female with history of stage IVB ovarian cancer with peritoneal carcinomatosis and pulmonary nodal metastases who presents today for port a cath placement for chemotherapy.   Past Medical History  Diagnosis Date  . Hypertension   . Schizophrenia   . Regional enteritis   . Ulcerative colitis   . Mixed hyperlipidemia   . Vitamin D deficiency   . B12 deficiency   . Iron deficiency anemia   . Ovarian cancer     Past Surgical History  Procedure Laterality Date  . Replacement total knee      right knee in 2003  . Paracentesis  04/27/15    Allergies: Review of patient's allergies indicates no known allergies.  Medications: Prior to Admission medications   Medication Sig Start Date End Date Taking? Authorizing Provider  amLODipine (NORVASC) 10 MG tablet Take 10 mg by mouth daily.   Yes Historical Provider, MD  aspirin EC 81 MG tablet Take 81 mg by mouth daily.   Yes Historical Provider, MD  Cholecalciferol (VITAMIN D-3) 1000 UNITS CAPS Take 1,000 Units by mouth daily.    Yes Historical Provider, MD  ferrous sulfate 325 (65 FE) MG EC tablet Take 325 mg by mouth 2 (two) times daily.   Yes Historical Provider, MD  folic acid (FOLVITE) 1 MG tablet Take 1 mg by mouth daily.   Yes Historical Provider, MD  lisinopril-hydrochlorothiazide (PRINZIDE,ZESTORETIC) 20-25 MG per tablet Take 1 tablet by mouth daily.   Yes Historical Provider, MD  lovastatin (MEVACOR) 10 MG tablet Take 10 mg by mouth at bedtime.   Yes Historical Provider, MD  mirtazapine (REMERON) 15 MG tablet Take 7.5 mg by mouth at bedtime.   Yes Historical Provider, MD  naproxen (NAPROSYN) 500 MG tablet Take 500 mg by mouth 2 (two) times daily as needed for mild pain or moderate pain.    Yes Historical Provider, MD  ondansetron (ZOFRAN ODT) 4  MG disintegrating tablet Take 1 tablet (4 mg total) by mouth every 8 (eight) hours as needed for nausea or vomiting. 04/28/15  Yes Samuella Cota, MD  QUEtiapine (SEROQUEL XR) 300 MG 24 hr tablet Take 300 mg by mouth at bedtime.   Yes Historical Provider, MD  sulfaSALAzine (AZULFIDINE) 500 MG tablet Take 500 mg by mouth 3 (three) times daily.   Yes Historical Provider, MD  vitamin B-12 (CYANOCOBALAMIN) 1000 MCG tablet Take 1,000 mcg by mouth daily.   Yes Historical Provider, MD  CARBOPLATIN IV Inject into the vein every 21 ( twenty-one) days. To start 05/13/15    Historical Provider, MD  dexamethasone (DECADRON) 4 MG tablet The day before chemo take 5 tablets (90m) in the am and 5 tablets (270m in the pm. The morning of chemo take 5 tablets (2019m Patient not taking: Reported on 05/11/2015 05/07/15   ShaPatrici RanksD  feeding supplement (BOOST / RESOURCE BREEZE) LIQD Take 1 Container by mouth 3 (three) times daily between meals. Patient not taking: Reported on 05/04/2015 04/28/15   DanSamuella CotaD  lidocaine-prilocaine (EMLA) cream Apply a quarter size amount to port site 1 hour prior to chemo. Do not rub in. Cover with plastic wrap. Patient not taking: Reported on 05/11/2015 05/07/15   ShaPatrici RanksD  ondansetron (ZOFRAN) 8 MG tablet Take 1 tablet (8 mg total) by  mouth every 8 (eight) hours as needed for nausea or vomiting. Patient not taking: Reported on 05/11/2015 05/07/15   Patrici Ranks, MD  PACLitaxel (TAXOL IV) Inject into the vein every 21 ( twenty-one) days. To start 05/13/15    Historical Provider, MD  Pegfilgrastim (NEULASTA ONPRO [place]) Inject into the skin every 21 ( twenty-one) days. To be applied to skin at the end of chemo.    Historical Provider, MD  prochlorperazine (COMPAZINE) 10 MG tablet Take 1 tablet (10 mg total) by mouth every 6 (six) hours as needed (Nausea or vomiting). Patient not taking: Reported on 05/11/2015 05/07/15   Patrici Ranks, MD     Family  History  Problem Relation Age of Onset  . Prostate cancer Brother   . Cancer Paternal Uncle   . Lung cancer Maternal Grandmother   . Hypertension Maternal Grandfather     Social History   Social History  . Marital Status: Single    Spouse Name: N/A  . Number of Children: N/A  . Years of Education: N/A   Social History Main Topics  . Smoking status: Never Smoker   . Smokeless tobacco: Never Used  . Alcohol Use: No  . Drug Use: No  . Sexual Activity: Not Asked   Other Topics Concern  . None   Social History Narrative      Review of Systems  Constitutional: Negative for fever and chills.  Respiratory: Negative for cough.        Occ dyspnea  Cardiovascular: Positive for leg swelling. Negative for chest pain.  Gastrointestinal: Negative for vomiting, abdominal pain and blood in stool.       Occ nausea  Genitourinary: Negative for dysuria and hematuria.  Musculoskeletal: Negative for back pain.  Neurological: Negative for headaches.    Vital Signs: Ht 5' 5.5" (1.664 m)  Wt 150 lb 11.2 oz (68.357 kg)  BMI 24.69 kg/m2  Physical Exam  Constitutional: She is oriented to person, place, and time. She appears well-developed and well-nourished.  Cardiovascular:  Sl tachy but regular  Pulmonary/Chest: Effort normal.  Sl dim BS rt base, left clear  Abdominal: Soft. Bowel sounds are normal. She exhibits distension. There is no tenderness.  Musculoskeletal: Normal range of motion. She exhibits edema.  Neurological: She is alert and oriented to person, place, and time.    Mallampati Score:     Imaging: Ct Abdomen Pelvis Wo Contrast  04/25/2015   CLINICAL DATA:  Vomiting and diarrhea for 2 weeks, shortness of breath, dizziness, leg swelling, history hypertension, ulcerative colitis  EXAM: CT ABDOMEN AND PELVIS WITHOUT CONTRAST  TECHNIQUE: Multidetector CT imaging of the abdomen and pelvis was performed following the standard protocol without IV contrast. IV contrast not  utilized due to renal dysfunction, creatinine 2.69. Sagittal and coronal MPR images reconstructed from axial data set. Patient drank dilute oral contrast for exam.  COMPARISON:  None  FINDINGS: Minimal RIGHT pleural effusion.  Within limits of a nonenhanced exam, liver, spleen, pancreas, kidneys, and adrenal glands normal. Normal appendix.  Cystocele present with significant inferior descent of urinary bladder in pelvis.  Extensive ascites.  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  These masses measure up to 5.1 cm in greatest dimension.  Uterus is small and atrophic with multiple calcifications.  Numerous pelvic phleboliths.  Stomach and bowel loops displaced by ascites but otherwise unremarkable.  Upper normal sized to borderline enlarged  retroperitoneal nodes without definite adenopathy.  No definite free air, hernia, or acute osseous findings.  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  Cystocele.   Electronically Signed   By: Lavonia Dana M.D.   On: 04/25/2015 15:04   Ct Chest Wo Contrast  04/27/2015   CLINICAL DATA:  74 year old female with peritoneal carcinomatosis.  EXAM: CT CHEST WITHOUT CONTRAST  TECHNIQUE: Multidetector CT imaging of the chest was performed following the standard protocol without IV contrast.  COMPARISON:  CT abdomen/pelvis from 04/25/2015.  FINDINGS: Mediastinum/Nodes: Normal heart size. Mild pericardial fluid/thickening. Mildly atherosclerotic nonaneurysmal thoracic aorta. Normal caliber pulmonary arteries. Low-density cardiac blood pool, indicating anemia. Normal visualized thyroid. Normal esophagus. No axillary lymphadenopathy. Multiple clustered mildly enlarged right pericardial phrenic lymph nodes, largest 1.2 cm short axis (series 2/image 47).  Solitary mildly enlarged 1.1 cm left pericardial phrenic node (2/52). Mildly enlarged 1.3 cm prevascular mediastinal node (2/18). Mildly enlarged 1.1 cm subcarinal node (2/29). No gross hilar adenopathy, noting limited sensitivity for the detection of hilar lymphadenopathy on a noncontrast study. There is a suggestion of mild left supraclavicular lymphadenopathy, largest 1.1 cm (2/8).  Lungs/Pleura: Small right and trace left pleural effusions, increased bilaterally. No pneumothorax. Nonspecific 3 mm right vocal cord polyp (3/3). Right middle lobe 3 mm subpleural pulmonary nodule (3/37) and separate 3 mm right middle lobe pulmonary nodule (3/38). Segmental basilar right lower lobe atelectasis. 3 mm superior segment left lower lobe pulmonary nodule associated with the left major fissure (3/22). Subsegmental left lower lobe atelectasis.  Upper abdomen: Small volume upper abdominal ascites, decreased. Stable abnormal fat stranding throughout the visualized upper peritoneal cavity, with multiple nodular tumor implants, in keeping with peritoneal carcinomatosis, largest 2.8 cm in the left upper quadrant (2/ 60) .  Musculoskeletal: Mild to moderate degenerative changes in the thoracic spine. No suspicious focal osseous lesions.  IMPRESSION: 1. Mild left supraclavicular lymphadenopathy and moderate mediastinal lymphadenopathy involving the prevascular, subcarinal and bilateral pericardiophrenic nodal chains, likely metastatic. 2. Small right and trace left pleural effusions, increased bilaterally, with associated bibasilar atelectasis. Mild pericardial fluid/thickening. 3. Three tiny pulmonary nodules, largest 3 mm, indeterminate. Recommend follow-up chest CT in 3 months. 4. Decreased small volume upper abdominal ascites. Stable findings of peritoneal carcinomatosis in the visualized upper abdomen. 5. Nonspecific 3 mm right vocal cord polyp. 6. Anemia.   Electronically Signed   By: Ilona Sorrel M.D.   On: 04/27/2015 13:27     US Transvaginal Non-ob  04/27/2015   CLINICAL DATA:  Peritoneal carcinomatosis  EXAM: TRANSABDOMINAL AND TRANSVAGINAL ULTRASOUND OF PELVIS  TECHNIQUE: Both transabdominal and transvaginal ultrasound examinations of the pelvis were performed. Transabdominal technique was performed for global imaging of the pelvis including uterus, ovaries, adnexal regions, and pelvic cul-de-sac. It was necessary to proceed with endovaginal exam following the transabdominal exam to visualize the uterus and adnexa.  COMPARISON:  CT 04/25/2015  FINDINGS: Uterus  Measurements: 7.0 x 2.6 x 4.6 cm. Small uterine calcifications. Fluid in the uterine cavity measuring 2.5 mm in thickness. No definite uterine mass.  Endometrium  Thickness: Small amount of fluid in the uterine cavity. No in and material mass identified. No focal abnormality visualized.  Right ovary  Measurements: . Not identified separate firm adnexal masses.  Left ovary  Measurements: . Not identified separate from adnexal masses.  Other findings  Large amount of free fluid which has internal echoes suggesting blood or possibly tumor or infection.  Multiple soft tissue masses throughout the adnexum consistent with peritoneal carcinomatosis.  IMPRESSION: Ascites and multiple peritoneal soft tissue masses compatible with peritoneal tumor. This could be of ovarian etiology but could also be metastatic. Discrete ovaries not identified.  Small amount of free fluid in the uterine cavity. No uterine mass identified.   Electronically Signed   By: Franchot Gallo M.D.   On: 04/27/2015 13:27   US Pelvis Complete  04/27/2015   CLINICAL DATA:  Peritoneal carcinomatosis  EXAM: TRANSABDOMINAL AND TRANSVAGINAL ULTRASOUND OF PELVIS  TECHNIQUE: Both transabdominal and transvaginal ultrasound examinations of the pelvis were performed. Transabdominal technique was performed for global imaging of the pelvis including uterus, ovaries, adnexal regions, and pelvic cul-de-sac. It was necessary to  proceed with endovaginal exam following the transabdominal exam to visualize the uterus and adnexa.  COMPARISON:  CT 04/25/2015  FINDINGS: Uterus  Measurements: 7.0 x 2.6 x 4.6 cm. Small uterine calcifications. Fluid in the uterine cavity measuring 2.5 mm in thickness. No definite uterine mass.  Endometrium  Thickness: Small amount of fluid in the uterine cavity. No in and material mass identified. No focal abnormality visualized.  Right ovary  Measurements: . Not identified separate firm adnexal masses.  Left ovary  Measurements: . Not identified separate from adnexal masses.  Other findings  Large amount of free fluid which has internal echoes suggesting blood or possibly tumor or infection. Multiple soft tissue masses throughout the adnexum consistent with peritoneal carcinomatosis.  IMPRESSION: Ascites and multiple peritoneal soft tissue masses compatible with peritoneal tumor. This could be of ovarian etiology but could also be metastatic. Discrete ovaries not identified.  Small amount of free fluid in the uterine cavity. No uterine mass identified.   Electronically Signed   By: Franchot Gallo M.D.   On: 04/27/2015 13:27   US Paracentesis  04/27/2015   CLINICAL DATA:  Malignant ascites  EXAM: ULTRASOUND GUIDED  PARACENTESIS  COMPARISON:  CT abdomen pelvis 04/25/2015  PROCEDURE: An ultrasound guided paracentesis was thoroughly discussed with the patient and questions answered. The benefits, risks, alternatives and complications were also discussed. The patient understands and wishes to proceed with the procedure. Written consent was obtained.  Ultrasound was performed to localize and mark an adequate pocket of fluid in the left lower quadrant of the abdomen. The area was then prepped and draped in the normal sterile fashion. 1% Lidocaine was used for local anesthesia. Under ultrasound guidance a 19 gauge Yueh catheter was introduced. Paracentesis was performed. The catheter was removed and a dressing applied.   COMPLICATIONS: None.  FINDINGS: A total of approximately 3700 mL of amber colored fluid was removed. A fluid sample was sent for laboratory analysis.  IMPRESSION: Successful ultrasound guided paracentesis yielding 3700 mL of ascites.   Electronically Signed   By: Franchot Gallo M.D.   On: 04/27/2015 13:03    Labs:  CBC:  Recent Labs  04/25/15 1114 04/26/15 0612 04/27/15 0632 05/11/15 1125  WBC 8.3 7.4 7.8 8.8  HGB 8.4* 8.2* 8.5* 8.0*  HCT 26.8* 26.5* 27.3* 25.5*  PLT 411* 386 414* 534*    COAGS:  Recent Labs  04/25/15 1110 05/11/15 1125  INR 1.28 1.20  APTT  --  35    BMP:  Recent Labs  04/25/15 1114 04/26/15 0612 04/27/15 0632  NA 138 138 138  K 4.4 4.3 4.5  CL 105 109 110  CO2 20* 22 21*  GLUCOSE 93 94 100*  BUN 43* 30* 19  CALCIUM 9.2 8.5* 8.6*  CREATININE 2.69* 1.60* 1.34*  GFRNONAA 17* 31* 39*  GFRAA 19* 36* 45*    LIVER FUNCTION TESTS:  Recent Labs  04/25/15 1114 04/26/15 0612  BILITOT 0.6 0.4  AST 40 35  ALT 17 16  ALKPHOS 57 49  PROT 7.6 6.3*  ALBUMIN 3.7 3.0*    TUMOR MARKERS:  Recent Labs  04/26/15 0612  CEA 0.5    Assessment and Plan:  Pt with hx of stage IVB ovarian cancer; plan is for port a cath placement today for chemotherapy. Risks and benefits discussed with the patient including, but not limited to bleeding, infection, pneumothorax, or fibrin sheath development and need for additional procedures.All of the patient's questions were answered, patient is agreeable to proceed.Consent signed and in chart.    Thank you for this interesting consult.  I greatly enjoyed meeting Roshaunda Starkey and look forward to participating in their care.  A copy of this report was sent to the requesting provider on this date.  Signed: D. Rowe Robert 05/11/2015, 1:21 PM   I spent a total of 15 minutes in face to face in clinical consultation, greater than 50% of which was counseling/coordinating care for port a cath  placement

## 2015-05-11 NOTE — Discharge Instructions (Signed)
Implanted Port Insertion, Care After Refer to this sheet in the next few weeks. These instructions provide you with information on caring for yourself after your procedure. Your health care provider may also give you more specific instructions. Your treatment has been planned according to current medical practices, but problems sometimes occur. Call your health care provider if you have any problems or questions after your procedure. WHAT TO EXPECT AFTER THE PROCEDURE After your procedure, it is typical to have the following:   Discomfort at the port insertion site. Ice packs to the area will help.  Bruising on the skin over the port. This will subside in 3-4 days. HOME CARE INSTRUCTIONS  After your port is placed, you will get a manufacturer's information card. The card has information about your port. Keep this card with you at all times.   Know what kind of port you have. There are many types of ports available.   Wear a medical alert bracelet in case of an emergency. This can help alert health care workers that you have a port.   The port can stay in for as long as your health care provider believes it is necessary.   A home health care nurse may give medicines and take care of the port.   You or a family member can get special training and directions for giving medicine and taking care of the port at home.  SEEK MEDICAL CARE IF:   Your port does not flush or you are unable to get a blood return.   You have a fever or chills. SEEK IMMEDIATE MEDICAL CARE IF:  You have new fluid or pus coming from your incision.   You notice a bad smell coming from your incision site.   You have swelling, pain, or more redness at the incision or port site.   You have chest pain or shortness of breath. Document Released: 06/26/2013 Document Revised: 09/10/2013 Document Reviewed: 06/26/2013 [facility] Patient Information 2015 [place], [place]. This information is not intended to replace  advice given to you by your health care provider. Make sure you discuss any questions you have with your health care provider. Implanted [facility] Guide An implanted port is a type of central line that is placed under the skin. Central lines are used to provide IV access when treatment or nutrition needs to be given through a person's veins. Implanted ports are used for long-term IV access. An implanted port may be placed because:   You need IV medicine that would be irritating to the small veins in your hands or arms.   You need long-term IV medicines, such as antibiotics.   You need IV nutrition for a long period.   You need frequent blood draws for lab tests.   You need dialysis.  Implanted ports are usually placed in the chest area, but they can also be placed in the upper arm, the abdomen, or the leg. An implanted port has two main parts:   Reservoir. The reservoir is round and will appear as a small, raised area under your skin. The reservoir is the part where a needle is inserted to give medicines or draw blood.   Catheter. The catheter is a thin, flexible tube that extends from the reservoir. The catheter is placed into a large vein. Medicine that is inserted into the reservoir goes into the catheter and then into the vein.  HOW WILL I CARE FOR MY INCISION SITE? Do not get the incision site wet. Bathe or  shower as directed by your health care provider.  HOW IS MY PORT ACCESSED? Special steps must be taken to access the port:   Before the port is accessed, a numbing cream can be placed on the skin. This helps numb the skin over the port site.   Your health care provider uses a sterile technique to access the port.  Your health care provider must put on a mask and sterile gloves.  The skin over your port is cleaned carefully with an antiseptic and allowed to dry.  The port is gently pinched between sterile gloves, and a needle is inserted into the port.  Only  "non-coring" port needles should be used to access the port. Once the port is accessed, a blood return should be checked. This helps ensure that the port is in the vein and is not clogged.   If your port needs to remain accessed for a constant infusion, a clear (transparent) bandage will be placed over the needle site. The bandage and needle will need to be changed every week, or as directed by your health care provider.   Keep the bandage covering the needle clean and dry. Do not get it wet. Follow your health care provider's instructions on how to take a shower or bath while the port is accessed.   If your port does not need to stay accessed, no bandage is needed over the port.  WHAT IS FLUSHING? Flushing helps keep the port from getting clogged. Follow your health care provider's instructions on how and when to flush the port. Ports are usually flushed with saline solution or a medicine called heparin. The need for flushing will depend on how the port is used.   If the port is used for intermittent medicines or blood draws, the port will need to be flushed:   After medicines have been given.   After blood has been drawn.   As part of routine maintenance.   If a constant infusion is running, the port may not need to be flushed.  HOW LONG WILL MY PORT STAY IMPLANTED? The port can stay in for as long as your health care provider thinks it is needed. When it is time for the port to come out, surgery will be done to remove it. The procedure is similar to the one performed when the port was put in.  WHEN SHOULD I SEEK IMMEDIATE MEDICAL CARE? When you have an implanted port, you should seek immediate medical care if:   You notice a bad smell coming from the incision site.   You have swelling, redness, or drainage at the incision site.   You have more swelling or pain at the port site or the surrounding area.   You have a fever that is not controlled with medicine. Document  Released: 09/05/2005 Document Revised: 06/26/2013 Document Reviewed: 05/13/2013 [facility] Patient Information 2015 [place], [place]. This information is not intended to replace advice given to you by your health care provider. Make sure you discuss any questions you have with your health care provider. Conscious Sedation Sedation is the use of medicines to promote relaxation and relieve discomfort and anxiety. Conscious sedation is a type of sedation. Under conscious sedation you are less alert than normal but are still able to respond to instructions or stimulation. Conscious sedation is used during short medical and dental procedures. It is milder than deep sedation or general anesthesia and allows you to return to your regular activities sooner.  [place]  KNOW ABOUT:   Any allergies you have.  All medicines you are taking, including vitamins, herbs, eye drops, creams, and over-the-counter medicines.  Use of steroids (by mouth or creams).  Previous problems you or members of your family have had with the use of anesthetics.  Any blood disorders you have.  Previous surgeries you have had.  Medical conditions you have.  Possibility of pregnancy, if this applies.  Use of cigarettes, alcohol, or illegal drugs. RISKS AND COMPLICATIONS Generally, this is a safe procedure. However, as with any procedure, problems can occur. Possible problems include:  Oversedation.  Trouble breathing on your own. You may need to have a breathing tube until you are awake and breathing on your own.  Allergic reaction to any of the medicines used for the procedure. BEFORE THE PROCEDURE  You may have blood tests done. These tests can help show how well your kidneys and liver are working. They can also show how well your blood clots.  A physical exam will be done.  Only take medicines as directed by your health care provider. You may need to stop taking medicines (such as blood  thinners, aspirin, or nonsteroidal anti-inflammatory drugs) before the procedure.   Do not eat or drink at least 6 hours before the procedure or as directed by your health care provider.  Arrange for a responsible adult, family member, or friend to take you home after the procedure. He or she should stay with you for at least 24 hours after the procedure, until the medicine has worn off. PROCEDURE   An intravenous (IV) catheter will be inserted into one of your veins. Medicine will be able to flow directly into your body through this catheter. You may be given medicine through this tube to help prevent pain and help you relax.  The medical or dental procedure will be done. AFTER THE PROCEDURE  You will stay in a recovery area until the medicine has worn off. Your blood pressure and pulse will be checked.   Depending on the procedure you had, you may be allowed to go home when you can tolerate liquids and your pain is under control. Document Released: 05/31/2001 Document Revised: 09/10/2013 Document Reviewed: 05/13/2013 [facility] Patient Information 2015 [place], [place]. This information is not intended to replace advice given to you by your health care provider. Make sure you discuss any questions you have with your health care provider. Conscious Sedation, Adult, Care After Refer to this sheet in the next few weeks. These instructions provide you with information on caring for yourself after your procedure. Your health care provider may also give you more specific instructions. Your treatment has been planned according to current medical practices, but problems sometimes occur. Call your health care provider if you have any problems or questions after your procedure. WHAT TO EXPECT AFTER THE PROCEDURE  After your procedure:  You may feel sleepy, clumsy, and have poor balance for several hours.  Vomiting may occur if you eat too soon after the procedure. HOME CARE INSTRUCTIONS  Do not  participate in any activities where you could become injured for at least 24 hours. Do not:  Drive.  Swim.  Ride a bicycle.  Operate heavy machinery.  Cook.  Use power tools.  Climb ladders.  Work from a high place.  Do not make important decisions or sign legal documents until you are improved.  If you vomit, drink water, juice, or soup when you can drink without vomiting. Make sure you have little  or no nausea before eating solid foods.  Only take over-the-counter or prescription medicines for pain, discomfort, or fever as directed by your health care provider.  Make sure you and your family fully understand everything about the medicines given to you, including what side effects may occur.  You should not drink alcohol, take sleeping pills, or take medicines that cause drowsiness for at least 24 hours.  If you smoke, do not smoke without supervision.  If you are feeling better, you may resume normal activities 24 hours after you were sedated.  Keep all appointments with your health care provider. SEEK MEDICAL CARE IF:  Your skin is pale or bluish in color.  You continue to feel nauseous or vomit.  Your pain is getting worse and is not helped by medicine.  You have bleeding or swelling.  You are still sleepy or feeling clumsy after 24 hours. SEEK IMMEDIATE MEDICAL CARE IF:  You develop a rash.  You have difficulty breathing.  You develop any type of allergic problem.  You have a fever. MAKE SURE YOU:  Understand these instructions.  Will watch your condition.  Will get help right away if you are not doing well or get worse. Document Released: 06/26/2013 Document Reviewed: 06/26/2013 [facility] Patient Information 2015 [place], [place]. This information is not intended to replace advice given to you by your health care provider. Make sure you discuss any questions you have with your health care provider.

## 2015-05-11 NOTE — Procedures (Signed)
Successful RT IJ POWER PORT Tip SVC/RA No comp Stable Ready for use Full report in PACS

## 2015-05-11 NOTE — Patient Instructions (Addendum)
[place]   CHEMOTHERAPY INSTRUCTIONS  Premeds: Premeds: Zofran - for nausea/vomiting prevention/reduction. Dexamethasone - steroid - given to reduce the risk of you having an allergic type reaction to the chemotherapy. Dex can cause you to feel energized, nervous/anxious/jittery, make you have trouble sleeping, and/or make you feel hot/flushed in the face/neck and/or look pink/red in the face/neck. These side effects will pass as the Dex wears off. Benadryl - anti-histamine - given to reduce the risk of you having an allergic reaction to the Taxol chemo. Pepcid - given to reduce the risk of you having an allergic reaction to the Taxol chemo.  (takes 1 hour to infuse)   Taxol - the first time you receive this drug we will titrate it very slowly to ensure that you do not have or are not having an allergic reaction to the chemo. You will also be responsible for taking a steroid called Dexamethasone before and after chemo. This is to reduce your risk of having an allergic reaction to the Taxol. Side Effects: hair loss, lowers your white blood cells (fight infection), muscle aches, nausea/vomiting, irritation to the mouth (mouth sores, pain in your mouth) *neuropathy - numbness/tingling/burning in hands/fingers/feet/toes. We need to know as soon as this begins to happen so that we can monitor it and treat if necessary. The numbness generally begins in the fingertips of tips of toes and then begins to travel up the finger/toe/hand/foot. We never want you getting to where you can't pick up a pen, coin, zip a zipper, button a button, or have trouble walking. You must tell us immediately if you are experiencing peripheral neuropathy! (This medication will take 3 hours to infuse.)  Carboplatin - this medication can be hard on your kidneys - this is why we need you to drink 64 oz of fluid (preferably water/decaff fluids) 2 days prior to chemo and for up to 4-5 days after chemo.  Drink more if you can. This will help to keep your kidneys flushed. This can cause mild hair loss, lower your platelets (which make your blood clot), lower your white blood cells (fight infection), and cause nausea/vomiting. (This medication will take 30 minutes to infuse.)  Neulasta - this medication is not chemo but being given because you have had chemo. This will be given 27 hours after the completion of chemotherapy. This medication works by boosting your bone marrow's supply of white blood cells. White blood cells are what protect our bodies against infection. The medication is given in the form of a subcutaneous injection. It is given in the fatty tissue of your abdomen. It is a short needle. The major side effect of this medication is bone or muscle pain. The drug of choice to relieve or lessen the pain is Aleve or Ibuprofen. If a physician has ever told you not to take Aleve or Ibuprofen - then don't take it. You should then take Tylenol/acetaminophen. Take either medication as the bottle directs you to.  The level of pain you experience as a result of this injection can range from none, to mild or moderate, or severe. Please let us know if you develop moderate or severe bone pain.    You will receive these drugs once every 21 days.   POTENTIAL SIDE EFFECTS OF TREATMENT: Increased Susceptibility to Infection, Vomiting, Constipation, Changes in Character of Skin and Nails (brittleness, dryness,etc.), Bone Marrow Suppression, Abdominal Cramping, Complete Hair Loss, Nausea, Diarrhea and Mouth Sores   SELF IMAGE NEEDS AND  REFERRALS MADE: Obtain hair accessories as soon as possible (wigs, scarves, turbans,caps,etc.)  Referral to Look Good, Feel Better consultant   EDUCATIONAL MATERIALS GIVEN AND REVIEWED: Chemotherapy and You booklet Specific Instructions Sheets: Taxol, Carboplatin, Zofran, Dexamethasone, Pepcid, Benadryl,  Neulasta, EMLA cream   SELF CARE ACTIVITIES WHILE ON  CHEMOTHERAPY: Increase your fluid intake 48 hours prior to treatment and drink at least 2 quarts per day after treatment., No alcohol intake., No aspirin or other medications unless approved by your oncologist., Eat foods that are light and easy to digest., Eat foods at cold or room temperature., No fried, fatty, or spicy foods immediately before or after treatment., Have teeth cleaned professionally before starting treatment. Keep dentures and partial plates clean., Use soft toothbrush and do not use mouthwashes that contain alcohol. Biotene is a good mouthwash that is available at most pharmacies or may be ordered by calling [phone]., Use warm salt water gargles (1 teaspoon salt per 1 quart warm water) before and after meals and at bedtime. Or you may rinse with 2 tablespoons of three -percent hydrogen peroxide mixed in eight ounces of water., Always use sunscreen with SPF (Sun Protection Factor) of 30 or higher., Use your nausea medication as directed to prevent nausea., Use your stool softener or laxative as directed to prevent constipation. and Use your anti-diarrheal medication as directed to stop diarrhea.  Please wash your hands for at least 30 seconds using warm soapy water. Handwashing is the #1 way to prevent the spread of germs. Stay away from sick people or people who are getting over a cold. If you develop respiratory systems such as green/yellow mucus production or productive cough or persistent cough let us know and we will see if you need an antibiotic. It is a good idea to keep a pair of gloves on when going into grocery stores/[employer] to decrease your risk of coming into contact with germs on the carts, etc. Carry alcohol hand gel with you at all times and use it frequently if out in public. All foods need to be cooked thoroughly. No raw foods. No medium or undercooked meats, eggs. If your food is cooked medium well, it does not need to be hot pink or saturated with bloody liquid at all.  Vegetables and fruits need to be washed/rinsed under the faucet with a dish detergent before being consumed. You can eat raw fruits and vegetables unless we tell you otherwise but it would be best if you cooked them or bought frozen. Do not eat off of salad bars or hot bars unless you really trust the cleanliness of the restaurant. If you need dental work, please let Dr. Whitney Muse know before you go for your appointment so that we can coordinate the best possible time for you in regards to your chemo regimen. You need to also let your dentist know that you are actively taking chemo. We may need to do labs prior to your dental appointment. We also want your bowels moving at least every other day. If this is not happening, we need to know so that we can get you on a bowel regimen to help you go.    MEDICATIONS: You have been given prescriptions for the following medications:  Dexamethasone 38m tablet. The morning before chemo take 5 tabs in am and 5 tabs in pm. The morning of chemo take 5 tabs in am. Take with food.  Zofran 852mtablet. Take 1 tablet every 8 hours as needed for nausea/vomiting. (#1 nausea med  to take, this can constipate)  Compazine 69m tablet. Take 1 tablet every 6 hours as needed for nausea/vomiting. (#2 nausea med to take, this can make you sleepy)  EMLA cream. Apply a quarter size amount to port site 1 hour prior to chemo. Do not rub in. Cover with plastic wrap.    Over-the-Counter Meds:  Miralax 1 capful in 8 oz of fluid daily. May increase to two times a day if needed. This is a stool softener. If this doesn't work proceed you can add:  Senokot S  - start with 1 tablet two times a day and increase to 4 tablets two times a day if needed. (total of 8 tablets in a 24 hour period). This is a stimulant laxative.   Call [place] this does not help your bowels move.   Imodium 284mcapsule. Take 2 capsules after the 1st loose stool and then 1 capsule every 2 hours until you go a total  of 12 hours without having a loose stool. Call the [facility] loose stools continue.   SYMPTOMS TO REPORT AS SOON AS POSSIBLE AFTER TREATMENT:  FEVER GREATER THAN 100.5 F  CHILLS WITH OR WITHOUT FEVER  NAUSEA AND VOMITING THAT IS NOT CONTROLLED WITH YOUR NAUSEA MEDICATION  UNUSUAL SHORTNESS OF BREATH  UNUSUAL BRUISING OR BLEEDING  TENDERNESS IN MOUTH AND THROAT WITH OR WITHOUT PRESENCE OF ULCERS  URINARY PROBLEMS  BOWEL PROBLEMS  UNUSUAL RASH    Wear comfortable clothing and clothing appropriate for easy access to any Portacath or PICC line. Let us[place]ow if there is anything that we can do to make your therapy better!      I have been informed and understand all of the instructions given to me and have received a copy. I have been instructed to call the clinic ([phone] my family physician as soon as possible for continued medical care, if indicated. I do not have any more questions at this time but understand that I may call the [facility] the Patient Navigator at ([phone] office hours should I have questions or need assistance in obtaining follow-up care.          Carboplatin injection What is this medicine? CARBOPLATIN (KAR boe pla tin) is a chemotherapy drug. It targets fast dividing cells, like cancer cells, and causes these cells to die. This medicine is used to treat ovarian cancer and many other cancers. This medicine may be used for other purposes; ask your health care provider or pharmacist if you have questions. COMMON BRAND NAME(S): Paraplatin What should I tell my health care provider before I take this medicine? They need to know if you have any of these conditions: -blood disorders -hearing problems -kidney disease -recent or ongoing radiation therapy -an unusual or allergic reaction to carboplatin, cisplatin, other chemotherapy, other medicines, foods, dyes, or preservatives -pregnant or trying to get  pregnant -breast-feeding How should I use this medicine? This drug is usually given as an infusion into a vein. It is administered in a hospital or clinic by a specially trained health care professional. Talk to your pediatrician regarding the use of this medicine in children. Special care may be needed. Overdosage: If you think you have taken too much of this medicine contact a poison control center or emergency room at once. NOTE: This medicine is only for you. Do not share this medicine with others. What if I miss a dose? It is important not to miss a dose.  Call your doctor or health care professional if you are unable to keep an appointment. What may interact with this medicine? -medicines for seizures -medicines to increase blood counts like filgrastim, pegfilgrastim, sargramostim -some antibiotics like amikacin, gentamicin, neomycin, streptomycin, tobramycin -vaccines Talk to your doctor or health care professional before taking any of these medicines: -acetaminophen -aspirin -ibuprofen -ketoprofen -naproxen This list may not describe all possible interactions. Give your health care provider a list of all the medicines, herbs, non-prescription drugs, or dietary supplements you use. Also tell them if you smoke, drink alcohol, or use illegal drugs. Some items may interact with your medicine. What should I watch for while using this medicine? Your condition will be monitored carefully while you are receiving this medicine. You will need important blood work done while you are taking this medicine. This drug may make you feel generally unwell. This is not uncommon, as chemotherapy can affect healthy cells as well as cancer cells. Report any side effects. Continue your course of treatment even though you feel ill unless your doctor tells you to stop. In some cases, you may be given additional medicines to help with side effects. Follow all directions for their use. Call your doctor or  health care professional for advice if you get a fever, chills or sore throat, or other symptoms of a cold or flu. Do not treat yourself. This drug decreases your body's ability to fight infections. Try to avoid being around people who are sick. This medicine may increase your risk to bruise or bleed. Call your doctor or health care professional if you notice any unusual bleeding. Be careful brushing and flossing your teeth or using a toothpick because you may get an infection or bleed more easily. If you have any dental work done, tell your dentist you are receiving this medicine. Avoid taking products that contain aspirin, acetaminophen, ibuprofen, naproxen, or ketoprofen unless instructed by your doctor. These medicines may hide a fever. Do not become pregnant while taking this medicine. Women should inform their doctor if they wish to become pregnant or think they might be pregnant. There is a potential for serious side effects to an unborn child. Talk to your health care professional or pharmacist for more information. Do not breast-feed an infant while taking this medicine. What side effects may I notice from receiving this medicine? Side effects that you should report to your doctor or health care professional as soon as possible: -allergic reactions like skin rash, itching or hives, swelling of the face, lips, or tongue -signs of infection - fever or chills, cough, sore throat, pain or difficulty passing urine -signs of decreased platelets or bleeding - bruising, pinpoint red spots on the skin, black, tarry stools, nosebleeds -signs of decreased red blood cells - unusually weak or tired, fainting spells, lightheadedness -breathing problems -changes in hearing -changes in vision -chest pain -high blood pressure -low blood counts - This drug may decrease the number of white blood cells, red blood cells and platelets. You may be at increased risk for infections and bleeding. -nausea and  vomiting -pain, swelling, redness or irritation at the injection site -pain, tingling, numbness in the hands or feet -problems with balance, talking, walking -trouble passing urine or change in the amount of urine Side effects that usually do not require medical attention (report to your doctor or health care professional if they continue or are bothersome): -hair loss -loss of appetite -metallic taste in the mouth or changes in taste This list may not  describe all possible side effects. Call your doctor for medical advice about side effects. You may report side effects to FDA at 1-800-FDA-1088. Where should I keep my medicine? This drug is given in a hospital or clinic and will not be stored at home. NOTE: This sheet is a summary. It may not cover all possible information. If you have questions about this medicine, talk to your doctor, pharmacist, or health care provider.  2015, Elsevier/Gold Standard. (2007-12-11 14:38:05) Paclitaxel injection What is this medicine? PACLITAXEL (PAK li TAX el) is a chemotherapy drug. It targets fast dividing cells, like cancer cells, and causes these cells to die. This medicine is used to treat ovarian cancer, breast cancer, and other cancers. This medicine may be used for other purposes; ask your health care provider or pharmacist if you have questions. COMMON BRAND NAME(S): Onxol, Taxol What should I tell my health care provider before I take this medicine? They need to know if you have any of these conditions: -blood disorders -irregular heartbeat -infection (especially a virus infection such as chickenpox, cold sores, or herpes) -liver disease -previous or ongoing radiation therapy -an unusual or allergic reaction to paclitaxel, alcohol, polyoxyethylated castor oil, other chemotherapy agents, other medicines, foods, dyes, or preservatives -pregnant or trying to get pregnant -breast-feeding How should I use this medicine? This drug is given as an  infusion into a vein. It is administered in a hospital or clinic by a specially trained health care professional. Talk to your pediatrician regarding the use of this medicine in children. Special care may be needed. Overdosage: If you think you have taken too much of this medicine contact a poison control center or emergency room at once. NOTE: This medicine is only for you. Do not share this medicine with others. What if I miss a dose? It is important not to miss your dose. Call your doctor or health care professional if you are unable to keep an appointment. What may interact with this medicine? Do not take this medicine with any of the following medications: -disulfiram -metronidazole This medicine may also interact with the following medications: -cyclosporine -diazepam -ketoconazole -medicines to increase blood counts like filgrastim, pegfilgrastim, sargramostim -other chemotherapy drugs like cisplatin, doxorubicin, epirubicin, etoposide, teniposide, vincristine -quinidine -testosterone -vaccines -verapamil Talk to your doctor or health care professional before taking any of these medicines: -acetaminophen -aspirin -ibuprofen -ketoprofen -naproxen This list may not describe all possible interactions. Give your health care provider a list of all the medicines, herbs, non-prescription drugs, or dietary supplements you use. Also tell them if you smoke, drink alcohol, or use illegal drugs. Some items may interact with your medicine. What should I watch for while using this medicine? Your condition will be monitored carefully while you are receiving this medicine. You will need important blood work done while you are taking this medicine. This drug may make you feel generally unwell. This is not uncommon, as chemotherapy can affect healthy cells as well as cancer cells. Report any side effects. Continue your course of treatment even though you feel ill unless your doctor tells you to  stop. In some cases, you may be given additional medicines to help with side effects. Follow all directions for their use. Call your doctor or health care professional for advice if you get a fever, chills or sore throat, or other symptoms of a cold or flu. Do not treat yourself. This drug decreases your body's ability to fight infections. Try to avoid being around people who are sick.  This medicine may increase your risk to bruise or bleed. Call your doctor or health care professional if you notice any unusual bleeding. Be careful brushing and flossing your teeth or using a toothpick because you may get an infection or bleed more easily. If you have any dental work done, tell your dentist you are receiving this medicine. Avoid taking products that contain aspirin, acetaminophen, ibuprofen, naproxen, or ketoprofen unless instructed by your doctor. These medicines may hide a fever. Do not become pregnant while taking this medicine. Women should inform their doctor if they wish to become pregnant or think they might be pregnant. There is a potential for serious side effects to an unborn child. Talk to your health care professional or pharmacist for more information. Do not breast-feed an infant while taking this medicine. Men are advised not to father a child while receiving this medicine. What side effects may I notice from receiving this medicine? Side effects that you should report to your doctor or health care professional as soon as possible: -allergic reactions like skin rash, itching or hives, swelling of the face, lips, or tongue -low blood counts - This drug may decrease the number of white blood cells, red blood cells and platelets. You may be at increased risk for infections and bleeding. -signs of infection - fever or chills, cough, sore throat, pain or difficulty passing urine -signs of decreased platelets or bleeding - bruising, pinpoint red spots on the skin, black, tarry stools,  nosebleeds -signs of decreased red blood cells - unusually weak or tired, fainting spells, lightheadedness -breathing problems -chest pain -high or low blood pressure -mouth sores -nausea and vomiting -pain, swelling, redness or irritation at the injection site -pain, tingling, numbness in the hands or feet -slow or irregular heartbeat -swelling of the ankle, feet, hands Side effects that usually do not require medical attention (report to your doctor or health care professional if they continue or are bothersome): -bone pain -complete hair loss including hair on your head, underarms, pubic hair, eyebrows, and eyelashes -changes in the color of fingernails -diarrhea -loosening of the fingernails -loss of appetite -muscle or joint pain -red flush to skin -sweating This list may not describe all possible side effects. Call your doctor for medical advice about side effects. You may report side effects to FDA at 1-800-FDA-1088. Where should I keep my medicine? This drug is given in a hospital or clinic and will not be stored at home. NOTE: This sheet is a summary. It may not cover all possible information. If you have questions about this medicine, talk to your doctor, pharmacist, or health care provider.  2015, [employer]vier/Gold Standard. (2012-10-29 16:41:21) Ondansetron injection What is this medicine? ONDANSETRON (on DAN se tron) is used to treat nausea and vomiting caused by chemotherapy. It is also used to prevent or treat nausea and vomiting after surgery. This medicine may be used for other purposes; ask your health care provider or pharmacist if you have questions. COMMON BRAND NAME(S): Zofran What should I tell my health care provider before I take this medicine? They need to know if you have any of these conditions: -heart disease -history of irregular heartbeat -liver disease -low levels of magnesium or potassium in the blood -an unusual or allergic reaction to ondansetron,  granisetron, other medicines, foods, dyes, or preservatives -pregnant or trying to get pregnant -breast-feeding How should I use this medicine? This medicine is for infusion into a vein. It is given by a health care professional in a  hospital or clinic setting. Talk to your pediatrician regarding the use of this medicine in children. Special care may be needed. Overdosage: If you think you have taken too much of this medicine contact a poison control center or emergency room at once. NOTE: This medicine is only for you. Do not share this medicine with others. What if I miss a dose? This does not apply. What may interact with this medicine? Do not take this medicine with any of the following medications: -apomorphine -certain medicines for fungal infections like fluconazole, itraconazole, ketoconazole, posaconazole, voriconazole -cisapride -dofetilide -dronedarone -pimozide -thioridazine -ziprasidone This medicine may also interact with the following medications: -carbamazepine -certain medicines for depression, anxiety, or psychotic disturbances -fentanyl -linezolid -MAOIs like Carbex, Eldepryl, Marplan, Nardil, and Parnate -methylene blue (injected into a vein) -other medicines that prolong the QT interval (cause an abnormal heart rhythm) -phenytoin -rifampicin -tramadol This list may not describe all possible interactions. Give your health care provider a list of all the medicines, herbs, non-prescription drugs, or dietary supplements you use. Also tell them if you smoke, drink alcohol, or use illegal drugs. Some items may interact with your medicine. What should I watch for while using this medicine? Your condition will be monitored carefully while you are receiving this medicine. What side effects may I notice from receiving this medicine? Side effects that you should report to your doctor or health care professional as soon as possible: -allergic reactions like skin rash,  itching or hives, swelling of the face, lips, or tongue -breathing problems -confusion -dizziness -fast or irregular heartbeat -feeling faint or lightheaded, falls -fever and chills -loss of balance or coordination -seizures -sweating -swelling of the hands and feet -tightness in the chest -tremors -unusually weak or tired Side effects that usually do not require medical attention (report to your doctor or health care professional if they continue or are bothersome): -constipation or diarrhea -headache This list may not describe all possible side effects. Call your doctor for medical advice about side effects. You may report side effects to FDA at 1-800-FDA-1088. Where should I keep my medicine? This drug is given in a hospital or clinic and will not be stored at home. NOTE: This sheet is a summary. It may not cover all possible information. If you have questions about this medicine, talk to your doctor, pharmacist, or health care provider.  2015, [employer]. (2013-06-12 16:18:28) Dexamethasone injection What is this medicine? DEXAMETHASONE (dex a METH a sone) is a corticosteroid. It is used to treat inflammation of the skin, joints, lungs, and other organs. Common conditions treated include asthma, allergies, and arthritis. It is also used for other conditions, like blood disorders and diseases of the adrenal glands. This medicine may be used for other purposes; ask your health care provider or pharmacist if you have questions. COMMON BRAND NAME(S): Decadron, Solurex What should I tell my health care provider before I take this medicine? They need to know if you have any of these conditions: -blood clotting problems -Cushing's syndrome -diabetes -glaucoma -heart problems or disease -high blood pressure -infection like herpes, measles, tuberculosis, or chickenpox -kidney disease -liver disease -mental problems -myasthenia gravis -osteoporosis -previous heart  attack -seizures -stomach, ulcer or intestine disease including colitis and diverticulitis -thyroid problem -an unusual or allergic reaction to dexamethasone, corticosteroids, other medicines, lactose, foods, dyes, or preservatives -pregnant or trying to get pregnant -breast-feeding How should I use this medicine? This medicine is for injection into a muscle, joint, lesion, soft tissue, or vein. It  is given by a health care professional in a hospital or clinic setting. Talk to your pediatrician regarding the use of this medicine in children. Special care may be needed. Overdosage: If you think you have taken too much of this medicine contact a poison control center or emergency room at once. NOTE: This medicine is only for you. Do not share this medicine with others. What if I miss a dose? This may not apply. If you are having a series of injections over a prolonged period, try not to miss an appointment. Call your doctor or health care professional to reschedule if you are unable to keep an appointment. What may interact with this medicine? Do not take this medicine with any of the following medications: -mifepristone, RU-486 -vaccines This medicine may also interact with the following medications: -amphotericin B -antibiotics like clarithromycin, erythromycin, and troleandomycin -aspirin and aspirin-like drugs -barbiturates like phenobarbital -carbamazepine -cholestyramine -cholinesterase inhibitors like donepezil, galantamine, rivastigmine, and tacrine -cyclosporine -digoxin -diuretics -ephedrine -female hormones, like estrogens or progestins and birth control pills -indinavir -isoniazid -ketoconazole -medicines for diabetes -medicines that improve muscle tone or strength for conditions like myasthenia gravis -NSAIDs, medicines for pain and inflammation, like ibuprofen or naproxen -phenytoin -rifampin -thalidomide -warfarin This list may not describe all possible  interactions. Give your health care provider a list of all the medicines, herbs, non-prescription drugs, or dietary supplements you use. Also tell them if you smoke, drink alcohol, or use illegal drugs. Some items may interact with your medicine. What should I watch for while using this medicine? Your condition will be monitored carefully while you are receiving this medicine. If you are taking this medicine for a long time, carry an identification card with your name and address, the type and dose of your medicine, and your doctor's name and address. This medicine may increase your risk of getting an infection. Stay away from people who are sick. Tell your doctor or health care professional if you are around anyone with measles or chickenpox. Talk to your health care provider before you get any vaccines that you take this medicine. If you are going to have surgery, tell your doctor or health care professional that you have taken this medicine within the last twelve months. Ask your doctor or health care professional about your diet. You may need to lower the amount of salt you eat. The medicine can increase your blood sugar. If you are a diabetic check with your doctor if you need help adjusting the dose of your diabetic medicine. What side effects may I notice from receiving this medicine? Side effects that you should report to your doctor or health care professional as soon as possible: -allergic reactions like skin rash, itching or hives, swelling of the face, lips, or tongue -black or tarry stools -change in the amount of urine -changes in vision -confusion, excitement, restlessness, a false sense of well-being -fever, sore throat, sneezing, cough, or other signs of infection, wounds that will not heal -hallucinations -increased thirst -mental depression, mood swings, mistaken feelings of self importance or of being mistreated -pain in hips, back, ribs, arms, shoulders, or legs -pain,  redness, or irritation at the injection site -redness, blistering, peeling or loosening of the skin, including inside the mouth -rounding out of face -swelling of feet or lower legs -unusual bleeding or bruising -unusual tired or weak -wounds that do not heal Side effects that usually do not require medical attention (report to your doctor or health care professional if they continue  or are bothersome): -diarrhea or constipation -change in taste -headache -nausea, vomiting -skin problems, acne, thin and shiny skin -touble sleeping -unusual growth of hair on the face or body -weight gain This list may not describe all possible side effects. Call your doctor for medical advice about side effects. You may report side effects to FDA at 1-800-FDA-1088. Where should I keep my medicine? This drug is given in a hospital or clinic and will not be stored at home. NOTE: This sheet is a summary. It may not cover all possible information. If you have questions about this medicine, talk to your doctor, pharmacist, or health care provider.  2015, [employer] Standard. (2007-12-27 14:04:12) Famotidine injection What is this medicine? FAMOTIDINE (fa MOE ti deen) is a type of antihistamine that blocks the release of stomach acid. It is used to treat stomach or intestinal ulcers. It can relieve ulcer pain and discomfort, and the heartburn from acid reflux. This medicine may be used for other purposes; ask your health care provider or pharmacist if you have questions. COMMON BRAND NAME(S): Pepcid What should I tell my health care provider before I take this medicine? They need to know if you have any of these conditions: -kidney or liver disease -an unusual or allergic reaction to famotidine, other medicines, foods, dyes, or preservatives -pregnant or trying to get pregnant -breast-feeding How should I use this medicine? This medicine is for infusion into a vein. It is given by a health care  professional in a hospital or clinic setting. Talk to your pediatrician regarding the use of this medicine in children. Special care may be needed. Overdosage: If you think you have taken too much of this medicine contact a poison control center or emergency room at once. NOTE: This medicine is only for you. Do not share this medicine with others. What if I miss a dose? This does not apply. What may interact with this medicine? -delavirdine -itraconazole -ketoconazole This list may not describe all possible interactions. Give your health care provider a list of all the medicines, herbs, non-prescription drugs, or dietary supplements you use. Also tell them if you smoke, drink alcohol, or use illegal drugs. Some items may interact with your medicine. What should I watch for while using this medicine? Tell your doctor or health care professional if your condition does not start to get better or gets worse. Do not take with aspirin, ibuprofen, or other antiinflammatory medicines. These can aggravate your condition. Do not smoke cigarettes or drink alcohol. These increase irritation in your stomach and can increase the time it will take for ulcers to heal. Cigarettes and alcohol can also worsen acid reflux or heartburn. If you get black, tarry stools or vomit up what looks like coffee grounds, call your doctor or health care professional at once. You may have a bleeding ulcer. What side effects may I notice from receiving this medicine? Side effects that you should report to your doctor or health care professional as soon as possible: -allergic reactions like skin rash, itching or hives, swelling of the face, lips, or tongue -agitation, nervousness -confusion -hallucinations Side effects that usually do not require medical attention (report to your doctor or health care professional if they continue or are bothersome): -constipation -diarrhea -dizziness -headache This list may not describe all  possible side effects. Call your doctor for medical advice about side effects. You may report side effects to FDA at 1-800-FDA-1088. Where should I keep my medicine? This medicine is given in a hospital  or clinic. You will not be given this medicine to store at home. NOTE: This sheet is a summary. It may not cover all possible information. If you have questions about this medicine, talk to your doctor, pharmacist, or health care provider.  2015, [employer] Standard. (2008-01-09 13:24:51) Diphenhydramine injection What is this medicine? DIPHENHYDRAMINE (dye fen HYE dra meen) is an antihistamine. It is used to treat the symptoms of an allergic reaction and motion sickness. It is also used to treat Parkinson's disease. This medicine may be used for other purposes; ask your health care provider or pharmacist if you have questions. COMMON BRAND NAME(S): Benadryl What should I tell my health care provider before I take this medicine? They need to know if you have any of these conditions: -asthma or lung disease -glaucoma -high blood pressure or heart disease -liver disease -pain or difficulty passing urine -prostate trouble -ulcers or other stomach problems -an unusual or allergic reaction to diphenhydramine, antihistamines, other medicines foods, dyes, or preservatives -pregnant or trying to get pregnant -breast-feeding How should I use this medicine? This medicine is for injection into a vein or a muscle. It is usually given by a health care professional in a hospital or clinic setting. If you get this medicine at home, you will be taught how to prepare and give this medicine. Use exactly as directed. Take your medicine at regular intervals. Do not take your medicine more often than directed. It is important that you put your used needles and syringes in a special sharps container. Do not put them in a trash can. If you do not have a sharps container, call your pharmacist or healthcare  provider to get one. Talk to your pediatrician regarding the use of this medicine in children. While this drug may be prescribed for selected conditions, precautions do apply. This medicine is not approved for use in newborns and premature babies. Patients over 103 years old may have a stronger reaction and need a smaller dose. Overdosage: If you think you have taken too much of this medicine contact a poison control center or emergency room at once. NOTE: This medicine is only for you. Do not share this medicine with others. What if I miss a dose? If you miss a dose, take it as soon as you can. If it is almost time for your next dose, take only that dose. Do not take double or extra doses. What may interact with this medicine? Do not take this medicine with any of the following medications: -MAOIs like Carbex, Eldepryl, Marplan, Nardil, and Parnate This medicine may also interact with the following medications: -alcohol -barbiturates, like phenobarbital -medicines for bladder spasm like oxybutynin, tolterodine -medicines for blood pressure -medicines for depression, anxiety, or psychotic disturbances -medicines for movement abnormalities or Parkinson's disease -medicines for sleep -other medicines for cold, cough or allergy -some medicines for the stomach like chlordiazepoxide, dicyclomine This list may not describe all possible interactions. Give your health care provider a list of all the medicines, herbs, non-prescription drugs, or dietary supplements you use. Also tell them if you smoke, drink alcohol, or use illegal drugs. Some items may interact with your medicine. What should I watch for while using this medicine? Your condition will be monitored carefully while you are receiving this medicine. Tell your doctor or healthcare professional if your symptoms do not start to get better or if they get worse. You may get drowsy or dizzy. Do not drive, use machinery, or do anything that needs  mental  alertness until you know how this medicine affects you. Do not stand or sit up quickly, especially if you are an older patient. This reduces the risk of dizzy or fainting spells. Alcohol may interfere with the effect of this medicine. Avoid alcoholic drinks. Your mouth may get dry. Chewing sugarless gum or sucking hard candy, and drinking plenty of water may help. Contact your doctor if the problem does not go away or is severe. What side effects may I notice from receiving this medicine? Side effects that you should report to your doctor or health care professional as soon as possible: -allergic reactions like skin rash, itching or hives, swelling of the face, lips, or tongue -breathing problems -changes in vision -chills -confused, agitated, nervous -irregular or fast heartbeat -low blood pressure -seizures -tremor -trouble passing urine -unusual bleeding or bruising -unusually weak or tired Side effects that usually do not require medical attention (report to your doctor or health care professional if they continue or are bothersome): -constipation, diarrhea -drowsy -headache -loss of appetite -stomach upset, vomiting -sweating -thick mucous This list may not describe all possible side effects. Call your doctor for medical advice about side effects. You may report side effects to FDA at 1-800-FDA-1088. Where should I keep my medicine? Keep out of the reach of children. If you are using this medicine at home, you will be instructed on how to store this medicine. Throw away any unused medicine after the expiration date on the label. NOTE: This sheet is a summary. It may not cover all possible information. If you have questions about this medicine, talk to your doctor, pharmacist, or health care provider.  2015, [employer] Standard. (2007-12-25 14:28:35) Pegfilgrastim injection What is this medicine? PEGFILGRASTIM (peg fil GRA stim) is a long-acting granulocyte  colony-stimulating factor that stimulates the growth of neutrophils, a type of white blood cell important in the body's fight against infection. It is used to reduce the incidence of fever and infection in patients with certain types of cancer who are receiving chemotherapy that affects the bone marrow. This medicine may be used for other purposes; ask your health care provider or pharmacist if you have questions. COMMON BRAND NAME(S): Neulasta What should I tell my health care provider before I take this medicine? They need to know if you have any of these conditions: -latex allergy -ongoing radiation therapy -sickle cell disease -skin reactions to acrylic adhesives (On-Body Injector only) -an unusual or allergic reaction to pegfilgrastim, filgrastim, other medicines, foods, dyes, or preservatives -pregnant or trying to get pregnant -breast-feeding How should I use this medicine? This medicine is for injection under the skin. If you get this medicine at home, you will be taught how to prepare and give the pre-filled syringe or how to use the On-body Injector. Refer to the patient Instructions for Use for detailed instructions. Use exactly as directed. Take your medicine at regular intervals. Do not take your medicine more often than directed. It is important that you put your used needles and syringes in a special sharps container. Do not put them in a trash can. If you do not have a sharps container, call your pharmacist or healthcare provider to get one. Talk to your pediatrician regarding the use of this medicine in children. Special care may be needed. Overdosage: If you think you have taken too much of this medicine contact a poison control center or emergency room at once. NOTE: This medicine is only for you. Do not share this medicine with others. What  if I miss a dose? It is important not to miss your dose. Call your doctor or health care professional if you miss your dose. If you miss a  dose due to an On-body Injector failure or leakage, a new dose should be administered as soon as possible using a single prefilled syringe for manual use. What may interact with this medicine? Interactions have not been studied. Give your health care provider a list of all the medicines, herbs, non-prescription drugs, or dietary supplements you use. Also tell them if you smoke, drink alcohol, or use illegal drugs. Some items may interact with your medicine. This list may not describe all possible interactions. Give your health care provider a list of all the medicines, herbs, non-prescription drugs, or dietary supplements you use. Also tell them if you smoke, drink alcohol, or use illegal drugs. Some items may interact with your medicine. What should I watch for while using this medicine? You may need blood work done while you are taking this medicine. If you are going to need a MRI, CT scan, or other procedure, tell your doctor that you are using this medicine (On-Body Injector only). What side effects may I notice from receiving this medicine? Side effects that you should report to your doctor or health care professional as soon as possible: -allergic reactions like skin rash, itching or hives, swelling of the face, lips, or tongue -dizziness -fever -pain, redness, or irritation at site where injected -pinpoint red spots on the skin -shortness of breath or breathing problems -stomach or side pain, or pain at the shoulder -swelling -tiredness -trouble passing urine Side effects that usually do not require medical attention (report to your doctor or health care professional if they continue or are bothersome): -bone pain -muscle pain This list may not describe all possible side effects. Call your doctor for medical advice about side effects. You may report side effects to FDA at 1-800-FDA-1088. Where should I keep my medicine? Keep out of the reach of children. Store pre-filled syringes in  a refrigerator between 2 and 8 degrees C (36 and 46 degrees F). Do not freeze. Keep in carton to protect from light. Throw away this medicine if it is left out of the refrigerator for more than 48 hours. Throw away any unused medicine after the expiration date. NOTE: This sheet is a summary. It may not cover all possible information. If you have questions about this medicine, talk to your doctor, pharmacist, or health care provider.  2015, [employer]. (2013-12-05 16:14:05) Lidocaine; Prilocaine cream What is this medicine? LIDOCAINE; PRILOCAINE (LYE doe kane; PRIL oh kane) is a topical anesthetic that causes loss of feeling in the skin and surrounding tissues. It is used to numb the skin before procedures or injections. This medicine may be used for other purposes; ask your health care provider or pharmacist if you have questions. COMMON BRAND NAME(S): EMLA What should I tell my health care provider before I take this medicine? They need to know if you have any of these conditions: -glucose-6-phosphate deficiencies -heart disease -kidney or liver disease -methemoglobinemia -an unusual or allergic reaction to lidocaine, prilocaine, other medicines, foods, dyes, or preservatives -pregnant or trying to get pregnant -breast-feeding How should I use this medicine? This medicine is for external use only on the skin. Do not take by mouth. Follow the directions on the prescription label. Wash hands before and after use. Do not use more or leave in contact with the skin longer than directed. Do not  apply to eyes or open wounds. It can cause irritation and blurred or temporary loss of vision. If this medicine comes in contact with your eyes, immediately rinse the eye with water. Do not touch or rub the eye. Contact your health care provider right away. Talk to your pediatrician regarding the use of this medicine in children. While this medicine may be prescribed for children for selected  conditions, precautions do apply. Overdosage: If you think you have taken too much of this medicine contact a poison control center or emergency room at once. NOTE: This medicine is only for you. Do not share this medicine with others. What if I miss a dose? This medicine is usually only applied once prior to each procedure. It must be in contact with the skin for a period of time for it to work. If you applied this medicine later than directed, tell your health care professional before starting the procedure. What may interact with this medicine? -acetaminophen -chloroquine -dapsone -medicines to control heart rhythm -nitrates like nitroglycerin and nitroprusside -other ointments, creams, or sprays that may contain anesthetic medicine -phenobarbital -phenytoin -quinine -sulfonamides like sulfacetamide, sulfamethoxazole, sulfasalazine and others This list may not describe all possible interactions. Give your health care provider a list of all the medicines, herbs, non-prescription drugs, or dietary supplements you use. Also tell them if you smoke, drink alcohol, or use illegal drugs. Some items may interact with your medicine. What should I watch for while using this medicine? Be careful to avoid injury to the treated area while it is numb and you are not aware of pain. Avoid scratching, rubbing, or exposing the treated area to hot or cold temperatures until complete sensation has returned. The numb feeling will wear off a few hours after applying the cream. What side effects may I notice from receiving this medicine? Side effects that you should report to your doctor or health care professional as soon as possible: -blurred vision -chest pain -difficulty breathing -dizziness -drowsiness -fast or irregular heartbeat -skin rash or itching -swelling of your throat, lips, or face -trembling Side effects that usually do not require medical attention (report to your doctor or health care  professional if they continue or are bothersome): -changes in ability to feel hot or cold -redness and swelling at the application site This list may not describe all possible side effects. Call your doctor for medical advice about side effects. You may report side effects to FDA at 1-800-FDA-1088. Where should I keep my medicine? Keep out of reach of children. Store at room temperature between 15 and 30 degrees C (59 and 86 degrees F). Keep container tightly closed. Throw away any unused medicine after the expiration date. NOTE: This sheet is a summary. It may not cover all possible information. If you have questions about this medicine, talk to your doctor, pharmacist, or health care provider.  2015, [employer]. (2008-03-10 17:14:35) Ondansetron tablets What is this medicine? ONDANSETRON (on DAN se tron) is used to treat nausea and vomiting caused by chemotherapy. It is also used to prevent or treat nausea and vomiting after surgery. This medicine may be used for other purposes; ask your health care provider or pharmacist if you have questions. COMMON BRAND NAME(S): Zofran What should I tell my health care provider before I take this medicine? They need to know if you have any of these conditions: -heart disease -history of irregular heartbeat -liver disease -low levels of magnesium or potassium in the blood -an unusual or allergic reaction  to ondansetron, granisetron, other medicines, foods, dyes, or preservatives -pregnant or trying to get pregnant -breast-feeding How should I use this medicine? Take this medicine by mouth with a glass of water. Follow the directions on your prescription label. Take your doses at regular intervals. Do not take your medicine more often than directed. Talk to your pediatrician regarding the use of this medicine in children. Special care may be needed. Overdosage: If you think you have taken too much of this medicine contact a poison control  center or emergency room at once. NOTE: This medicine is only for you. Do not share this medicine with others. What if I miss a dose? If you miss a dose, take it as soon as you can. If it is almost time for your next dose, take only that dose. Do not take double or extra doses. What may interact with this medicine? Do not take this medicine with any of the following medications: -apomorphine -certain medicines for fungal infections like fluconazole, itraconazole, ketoconazole, posaconazole, voriconazole -cisapride -dofetilide -dronedarone -pimozide -thioridazine -ziprasidone This medicine may also interact with the following medications: -carbamazepine -certain medicines for depression, anxiety, or psychotic disturbances -fentanyl -linezolid -MAOIs like Carbex, Eldepryl, Marplan, Nardil, and Parnate -methylene blue (injected into a vein) -other medicines that prolong the QT interval (cause an abnormal heart rhythm) -phenytoin -rifampicin -tramadol This list may not describe all possible interactions. Give your health care provider a list of all the medicines, herbs, non-prescription drugs, or dietary supplements you use. Also tell them if you smoke, drink alcohol, or use illegal drugs. Some items may interact with your medicine. What should I watch for while using this medicine? Check with your doctor or health care professional right away if you have any sign of an allergic reaction. What side effects may I notice from receiving this medicine? Side effects that you should report to your doctor or health care professional as soon as possible: -allergic reactions like skin rash, itching or hives, swelling of the face, lips or tongue -breathing problems -confusion -dizziness -fast or irregular heartbeat -feeling faint or lightheaded, falls -fever and chills -loss of balance or coordination -seizures -sweating -swelling of the hands or feet -tightness in the  chest -tremors -unusually weak or tired Side effects that usually do not require medical attention (report to your doctor or health care professional if they continue or are bothersome): -constipation or diarrhea -headache This list may not describe all possible side effects. Call your doctor for medical advice about side effects. You may report side effects to FDA at 1-800-FDA-1088. Where should I keep my medicine? Keep out of the reach of children. Store between 2 and 30 degrees C (36 and 86 degrees F). Throw away any unused medicine after the expiration date. NOTE: This sheet is a summary. It may not cover all possible information. If you have questions about this medicine, talk to your doctor, pharmacist, or health care provider.  2015, Elsevier/Gold Standard. (2013-06-12 16:27:45) Dexamethasone tablets What is this medicine? DEXAMETHASONE (dex a METH a sone) is a corticosteroid. It is commonly used to treat inflammation of the skin, joints, lungs, and other organs. Common conditions treated include asthma, allergies, and arthritis. It is also used for other conditions, such as blood disorders and diseases of the adrenal glands. This medicine may be used for other purposes; ask your health care provider or pharmacist if you have questions. COMMON BRAND NAME(S): Decadron, DexPak Sterling Big, DexPak TaperPak, Zema-Pak What should I tell my health care provider  before I take this medicine? They need to know if you have any of these conditions: -Cushing's syndrome -diabetes -glaucoma -heart problems or disease -high blood pressure -infection like herpes, measles, tuberculosis, or chickenpox -kidney disease -liver disease -mental problems -myasthenia gravis -osteoporosis -previous heart attack -seizures -stomach, ulcer or intestine disease including colitis and diverticulitis -thyroid problem -an unusual or allergic reaction to dexamethasone, corticosteroids, other medicines,  lactose, foods, dyes, or preservatives -pregnant or trying to get pregnant -breast-feeding How should I use this medicine? Take this medicine by mouth with a drink of water. Follow the directions on the prescription label. Take it with food or milk to avoid stomach upset. If you are taking this medicine once a day, take it in the morning. Do not take more medicine than you are told to take. Do not suddenly stop taking your medicine because you may develop a severe reaction. Your doctor will tell you how much medicine to take. If your doctor wants you to stop the medicine, the dose may be slowly lowered over time to avoid any side effects. Talk to your pediatrician regarding the use of this medicine in children. Special care may be needed. Patients over 30 years old may have a stronger reaction and need a smaller dose. Overdosage: If you think you have taken too much of this medicine contact a poison control center or emergency room at once. NOTE: This medicine is only for you. Do not share this medicine with others. What if I miss a dose? If you miss a dose, take it as soon as you can. If it is almost time for your next dose, talk to your doctor or health care professional. You may need to miss a dose or take an extra dose. Do not take double or extra doses without advice. What may interact with this medicine? Do not take this medicine with any of the following medications: -mifepristone, RU-486 -vaccines This medicine may also interact with the following medications: -amphotericin B -antibiotics like clarithromycin, erythromycin, and troleandomycin -aspirin and aspirin-like drugs -barbiturates like phenobarbital -carbamazepine -cholestyramine -cholinesterase inhibitors like donepezil, galantamine, rivastigmine, and tacrine -cyclosporine -digoxin -diuretics -ephedrine -female hormones, like estrogens or progestins and birth control pills -indinavir -isoniazid -ketoconazole -medicines  for diabetes -medicines that improve muscle tone or strength for conditions like myasthenia gravis -NSAIDs, medicines for pain and inflammation, like ibuprofen or naproxen -phenytoin -rifampin -thalidomide -warfarin This list may not describe all possible interactions. Give your health care provider a list of all the medicines, herbs, non-prescription drugs, or dietary supplements you use. Also tell them if you smoke, drink alcohol, or use illegal drugs. Some items may interact with your medicine. What should I watch for while using this medicine? Visit your doctor or health care professional for regular checks on your progress. If you are taking this medicine over a prolonged period, carry an identification card with your name and address, the type and dose of your medicine, and your doctor's name and address. This medicine may increase your risk of getting an infection. Stay away from people who are sick. Tell your doctor or health care professional if you are around anyone with measles or chickenpox. If you are going to have surgery, tell your doctor or health care professional that you have taken this medicine within the last twelve months. Ask your doctor or health care professional about your diet. You may need to lower the amount of salt you eat. The medicine can increase your blood sugar. If you are a  diabetic check with your doctor if you need help adjusting the dose of your diabetic medicine. What side effects may I notice from receiving this medicine? Side effects that you should report to your doctor or health care professional as soon as possible: -allergic reactions like skin rash, itching or hives, swelling of the face, lips, or tongue -changes in vision -fever, sore throat, sneezing, cough, or other signs of infection, wounds that will not heal -increased thirst -mental depression, mood swings, mistaken feelings of self importance or of being mistreated -pain in hips, back, ribs,  arms, shoulders, or legs -redness, blistering, peeling or loosening of the skin, including inside the mouth -trouble passing urine or change in the amount of urine -swelling of feet or lower legs -unusual bleeding or bruising Side effects that usually do not require medical attention (report to your doctor or health care professional if they continue or are bothersome): -headache -nausea, vomiting -skin problems, acne, thin and shiny skin -weight gain This list may not describe all possible side effects. Call your doctor for medical advice about side effects. You may report side effects to FDA at 1-800-FDA-1088. Where should I keep my medicine? Keep out of the reach of children. Store at room temperature between 20 and 25 degrees C (68 and 77 degrees F). Protect from light. Throw away any unused medicine after the expiration date. NOTE: This sheet is a summary. It may not cover all possible information. If you have questions about this medicine, talk to your doctor, pharmacist, or health care provider.  2015, Elsevier/Gold Standard. (2007-12-27 14:02:13)

## 2015-05-12 ENCOUNTER — Encounter (HOSPITAL_BASED_OUTPATIENT_CLINIC_OR_DEPARTMENT_OTHER): Payer: Medicare Other

## 2015-05-12 DIAGNOSIS — C569 Malignant neoplasm of unspecified ovary: Secondary | ICD-10-CM | POA: Diagnosis present

## 2015-05-12 NOTE — Progress Notes (Signed)
Chemo teaching done and consent signed for Taxol and Carboplatin. Distress screening done. Patient's med calendar given. Patient start Dex 5 tabs this am and knows to take additional dose tonight and in am (prior to chemo). Patient and son educated on side effects of Carbo/Taxol. Port drsg removed and port looks wnl. Bandaid removed from other incision area and that site looks wnl too.

## 2015-05-13 ENCOUNTER — Inpatient Hospital Stay (HOSPITAL_COMMUNITY)
Admission: AD | Admit: 2015-05-13 | Discharge: 2015-05-15 | DRG: 674 | Disposition: A | Discharge status: Home / Self Care | Payer: Medicare Other | Source: Ambulatory Visit | Attending: Internal Medicine | Admitting: Internal Medicine

## 2015-05-13 ENCOUNTER — Encounter (HOSPITAL_BASED_OUTPATIENT_CLINIC_OR_DEPARTMENT_OTHER): Payer: Medicare Other

## 2015-05-13 ENCOUNTER — Encounter (HOSPITAL_COMMUNITY): Payer: Self-pay | Admitting: *Deleted

## 2015-05-13 VITALS — BP 126/51 | HR 103 | Temp 97.6°F | Resp 16

## 2015-05-13 DIAGNOSIS — Z79899 Other long term (current) drug therapy: Secondary | ICD-10-CM | POA: Diagnosis not present

## 2015-05-13 DIAGNOSIS — T502X5A Adverse effect of carbonic-anhydrase inhibitors, benzothiadiazides and other diuretics, initial encounter: Secondary | ICD-10-CM | POA: Diagnosis present

## 2015-05-13 DIAGNOSIS — N179 Acute kidney failure, unspecified: Secondary | ICD-10-CM | POA: Diagnosis present

## 2015-05-13 DIAGNOSIS — E559 Vitamin D deficiency, unspecified: Secondary | ICD-10-CM | POA: Diagnosis not present

## 2015-05-13 DIAGNOSIS — C771 Secondary and unspecified malignant neoplasm of intrathoracic lymph nodes: Secondary | ICD-10-CM | POA: Diagnosis not present

## 2015-05-13 DIAGNOSIS — D649 Anemia, unspecified: Secondary | ICD-10-CM | POA: Diagnosis present

## 2015-05-13 DIAGNOSIS — E782 Mixed hyperlipidemia: Secondary | ICD-10-CM | POA: Diagnosis present

## 2015-05-13 DIAGNOSIS — F209 Schizophrenia, unspecified: Secondary | ICD-10-CM | POA: Diagnosis present

## 2015-05-13 DIAGNOSIS — I10 Essential (primary) hypertension: Secondary | ICD-10-CM | POA: Diagnosis present

## 2015-05-13 DIAGNOSIS — C569 Malignant neoplasm of unspecified ovary: Secondary | ICD-10-CM | POA: Diagnosis present

## 2015-05-13 DIAGNOSIS — Z452 Encounter for adjustment and management of vascular access device: Secondary | ICD-10-CM

## 2015-05-13 DIAGNOSIS — E538 Deficiency of other specified B group vitamins: Secondary | ICD-10-CM | POA: Diagnosis not present

## 2015-05-13 DIAGNOSIS — Z801 Family history of malignant neoplasm of trachea, bronchus and lung: Secondary | ICD-10-CM | POA: Diagnosis not present

## 2015-05-13 DIAGNOSIS — D630 Anemia in neoplastic disease: Secondary | ICD-10-CM | POA: Diagnosis present

## 2015-05-13 DIAGNOSIS — Z7982 Long term (current) use of aspirin: Secondary | ICD-10-CM | POA: Diagnosis not present

## 2015-05-13 DIAGNOSIS — E860 Dehydration: Secondary | ICD-10-CM | POA: Diagnosis present

## 2015-05-13 DIAGNOSIS — C786 Secondary malignant neoplasm of retroperitoneum and peritoneum: Secondary | ICD-10-CM | POA: Diagnosis present

## 2015-05-13 DIAGNOSIS — Z8249 Family history of ischemic heart disease and other diseases of the circulatory system: Secondary | ICD-10-CM | POA: Diagnosis not present

## 2015-05-13 LAB — CBC WITH DIFFERENTIAL/PLATELET
BASOS PCT: 0 % (ref 0–1)
Basophils Absolute: 0 10*3/uL (ref 0.0–0.1)
EOS ABS: 0 10*3/uL (ref 0.0–0.7)
EOS PCT: 0 % (ref 0–5)
HCT: 22.5 % — ABNORMAL LOW (ref 36.0–46.0)
Hemoglobin: 7.2 g/dL — ABNORMAL LOW (ref 12.0–15.0)
LYMPHS ABS: 0.6 10*3/uL — AB (ref 0.7–4.0)
Lymphocytes Relative: 5 % — ABNORMAL LOW (ref 12–46)
MCH: 28.8 pg (ref 26.0–34.0)
MCHC: 32 g/dL (ref 30.0–36.0)
MCV: 90 fL (ref 78.0–100.0)
MONOS PCT: 7 % (ref 3–12)
Monocytes Absolute: 0.9 10*3/uL (ref 0.1–1.0)
Neutro Abs: 10.6 10*3/uL — ABNORMAL HIGH (ref 1.7–7.7)
Neutrophils Relative %: 88 % — ABNORMAL HIGH (ref 43–77)
PLATELETS: 496 10*3/uL — AB (ref 150–400)
RBC: 2.5 MIL/uL — ABNORMAL LOW (ref 3.87–5.11)
RDW: 15.2 % (ref 11.5–15.5)
WBC: 12 10*3/uL — ABNORMAL HIGH (ref 4.0–10.5)

## 2015-05-13 LAB — COMPREHENSIVE METABOLIC PANEL
ALT: 17 U/L (ref 14–54)
ANION GAP: 9 (ref 5–15)
AST: 40 U/L (ref 15–41)
Albumin: 3.1 g/dL — ABNORMAL LOW (ref 3.5–5.0)
Alkaline Phosphatase: 46 U/L (ref 38–126)
BUN: 29 mg/dL — ABNORMAL HIGH (ref 6–20)
CALCIUM: 8.7 mg/dL — AB (ref 8.9–10.3)
CHLORIDE: 103 mmol/L (ref 101–111)
CO2: 23 mmol/L (ref 22–32)
Creatinine, Ser: 2.74 mg/dL — ABNORMAL HIGH (ref 0.44–1.00)
GFR calc non Af Amer: 16 mL/min — ABNORMAL LOW (ref >60)
GFR, EST AFRICAN AMERICAN: 19 mL/min — AB (ref >60)
Glucose, Bld: 124 mg/dL — ABNORMAL HIGH (ref 65–99)
POTASSIUM: 5.1 mmol/L (ref 3.5–5.1)
SODIUM: 135 mmol/L (ref 135–145)
Total Bilirubin: 0.5 mg/dL (ref 0.3–1.2)
Total Protein: 7 g/dL (ref 6.5–8.1)

## 2015-05-13 LAB — URINE MICROSCOPIC-ADD ON

## 2015-05-13 LAB — URINALYSIS, ROUTINE W REFLEX MICROSCOPIC
BILIRUBIN URINE: NEGATIVE
GLUCOSE, UA: NEGATIVE mg/dL
Leukocytes, UA: NEGATIVE
Nitrite: NEGATIVE
PH: 5 (ref 5.0–8.0)
Urobilinogen, UA: 0.2 mg/dL (ref 0.0–1.0)

## 2015-05-13 LAB — IRON AND TIBC
IRON: 30 ug/dL (ref 28–170)
Saturation Ratios: 18 % (ref 10.4–31.8)
TIBC: 165 ug/dL — ABNORMAL LOW (ref 250–450)
UIBC: 135 ug/dL

## 2015-05-13 LAB — ABO/RH: ABO/RH(D): A POS

## 2015-05-13 LAB — PREPARE RBC (CROSSMATCH)

## 2015-05-13 LAB — RETICULOCYTES
RBC.: 2.49 MIL/uL — AB (ref 3.87–5.11)
RETIC COUNT ABSOLUTE: 42.3 10*3/uL (ref 19.0–186.0)
Retic Ct Pct: 1.7 % (ref 0.4–3.1)

## 2015-05-13 LAB — FOLATE: FOLATE: 32 ng/mL (ref >5.9)

## 2015-05-13 LAB — TSH: TSH: 1.008 u[IU]/mL (ref 0.350–4.500)

## 2015-05-13 LAB — VITAMIN B12: Vitamin B-12: 1323 pg/mL — ABNORMAL HIGH (ref 180–914)

## 2015-05-13 LAB — FERRITIN: FERRITIN: 1233 ng/mL — AB (ref 11–307)

## 2015-05-13 MED ORDER — QUETIAPINE FUMARATE ER 300 MG PO TB24
300.0000 mg | ORAL_TABLET | Freq: Every day | ORAL | Status: DC
  Administered 2015-05-13 – 2015-05-14 (×2): 300 mg via ORAL
  Filled 2015-05-13 (×5): qty 1

## 2015-05-13 MED ORDER — ENSURE ENLIVE PO LIQD
237.0000 mL | Freq: Two times a day (BID) | ORAL | Status: DC
  Administered 2015-05-13 – 2015-05-15 (×4): 237 mL via ORAL

## 2015-05-13 MED ORDER — ACETAMINOPHEN 325 MG PO TABS: 650.0000 mg | ORAL_TABLET | Freq: Four times a day (QID) | ORAL | Status: DC | PRN

## 2015-05-13 MED ORDER — ACETAMINOPHEN 650 MG RE SUPP: 650.0000 mg | Freq: Four times a day (QID) | RECTAL | Status: DC | PRN

## 2015-05-13 MED ORDER — MIRTAZAPINE 15 MG PO TABS
7.5000 mg | ORAL_TABLET | Freq: Every day | ORAL | Status: DC
  Administered 2015-05-13 – 2015-05-14 (×2): 7.5 mg via ORAL
  Filled 2015-05-13 (×2): qty 1

## 2015-05-13 MED ORDER — SODIUM CHLORIDE 0.9 % IV SOLN
INTRAVENOUS | Status: DC
  Administered 2015-05-13: 14:00:00 via INTRAVENOUS
  Administered 2015-05-14 (×2): 1 mL via INTRAVENOUS
  Administered 2015-05-14 – 2015-05-15 (×2): via INTRAVENOUS

## 2015-05-13 MED ORDER — FOLIC ACID 1 MG PO TABS
1.0000 mg | ORAL_TABLET | Freq: Every day | ORAL | Status: DC
  Administered 2015-05-14 – 2015-05-15 (×2): 1 mg via ORAL
  Filled 2015-05-13 (×2): qty 1

## 2015-05-13 MED ORDER — ONDANSETRON HCL 4 MG/2ML IJ SOLN: 4.0000 mg | Freq: Four times a day (QID) | INTRAMUSCULAR | Status: DC | PRN

## 2015-05-13 MED ORDER — HEPARIN SOD (PORK) LOCK FLUSH 100 UNIT/ML IV SOLN: 500.0000 [IU] | Freq: Once | INTRAVENOUS | Status: DC | PRN

## 2015-05-13 MED ORDER — PRAVASTATIN SODIUM 10 MG PO TABS
10.0000 mg | ORAL_TABLET | Freq: Every day | ORAL | Status: DC
  Administered 2015-05-13 – 2015-05-14 (×2): 10 mg via ORAL
  Filled 2015-05-13 (×2): qty 1

## 2015-05-13 MED ORDER — ASPIRIN EC 81 MG PO TBEC
81.0000 mg | DELAYED_RELEASE_TABLET | Freq: Every day | ORAL | Status: DC
  Administered 2015-05-14 – 2015-05-15 (×2): 81 mg via ORAL
  Filled 2015-05-13 (×2): qty 1

## 2015-05-13 MED ORDER — AMLODIPINE BESYLATE 5 MG PO TABS
10.0000 mg | ORAL_TABLET | Freq: Every day | ORAL | Status: DC
  Administered 2015-05-14 – 2015-05-15 (×2): 10 mg via ORAL
  Filled 2015-05-13 (×2): qty 2

## 2015-05-13 MED ORDER — SODIUM CHLORIDE 0.9 % IJ SOLN: 10.0000 mL | INTRAMUSCULAR | Status: DC | PRN

## 2015-05-13 MED ORDER — LOPERAMIDE HCL 2 MG PO CAPS: 2.0000 mg | ORAL_CAPSULE | ORAL | Status: DC | PRN

## 2015-05-13 MED ORDER — ONDANSETRON HCL 4 MG PO TABS: 4.0000 mg | ORAL_TABLET | Freq: Four times a day (QID) | ORAL | Status: DC | PRN

## 2015-05-13 MED ORDER — SULFASALAZINE 500 MG PO TABS
500.0000 mg | ORAL_TABLET | Freq: Three times a day (TID) | ORAL | Status: DC
  Administered 2015-05-13 – 2015-05-15 (×6): 500 mg via ORAL
  Filled 2015-05-13 (×16): qty 1

## 2015-05-13 MED ORDER — SODIUM CHLORIDE 0.9 % IV SOLN: Freq: Once | INTRAVENOUS | Status: DC

## 2015-05-13 MED ORDER — SODIUM CHLORIDE 0.9 % IV SOLN
Freq: Once | INTRAVENOUS | Status: AC
  Administered 2015-05-13: 17:00:00 via INTRAVENOUS

## 2015-05-13 NOTE — H&P (Signed)
[facility] History and Physical  Latasha [name] PTW:[id number] DOB: 12-06-1940 DOA: 05/13/2015  Referring physician: Dr. Whitney [name] PCP: No PCP Per Patient   Chief Complaint: AKI  HPI: Latasha [name] is a 74 y.o. female presented to the cancer center today for schedule chemotherapy, labs were drawn and she was found to have elevated creatinine. Pt reports that she was in her usual state of health and did not have any specific complaints. She complains of swelling in her feet, but feels this may be a chronic issue. She denies nausea and vomiting but does report having loose stool, she feels that her diarrhea is related to Crohn's disease. She not on any specific antidiarrheal. Yesterday she reports having 2+ BM, she reports generalized weakness and was slightly dizzy this morning, which is worse on standing. She denies cough, abd pain, fever, dysuria, or abnormal urine.   Patient was in [place] today for her first round of chemotherapy. While in the office her labs were concerning for acute kidney  Injury, and her oncologist, Dr Latasha [name] asked for her to be admitted for AKI treatment.    Review of Systems:  Constitutional:  No weight loss, night sweats, Fevers, chills, fatigue.  HEENT:  No headaches, Difficulty swallowing,Tooth/dental problems,Sore throat,  No sneezing, itching, ear ache, nasal congestion, post nasal drip,  Cardio-vascular:  No chest pain, Orthopnea, PND, swelling in lower extremities, anasarca, dizziness, palpitations  GI:  No heartburn, indigestion, abdominal pain, nausea, vomiting, diarrhea, change in bowel habits, loss of appetite  Yes Diarrhea Resp:  No shortness of breath with exertion or at rest. No excess mucus, no productive cough, No non-productive cough, No coughing up of blood.No change in color of mucus.No wheezing.No chest wall deformity  Skin:  no rash or lesions.  GU:  no dysuria, change in color of urine, no urgency or frequency. No flank pain.   Musculoskeletal:  No joint pain or swelling. No decreased range of motion. No back pain.  Psych:  No change in mood or affect. No depression or anxiety. No memory loss.   Past Medical History  Diagnosis Date  . Hypertension   . Schizophrenia   . Regional enteritis   . Ulcerative colitis   . Mixed hyperlipidemia   . Vitamin D deficiency   . B12 deficiency   . Iron deficiency anemia   . Ovarian cancer    Past Surgical History  Procedure Laterality Date  . Replacement total knee      right knee in 2003  . Paracentesis  04/27/15   Social History:  reports that she has never smoked. She has never used smokeless tobacco. She reports that she does not drink alcohol or use illicit drugs.  No Known Allergies  Family History  Problem Relation Age of Onset  . Prostate cancer Brother   . Cancer Paternal Uncle   . Lung cancer Maternal Grandmother   . Hypertension Maternal Grandfather     Prior to Admission medications   Medication Sig Start Date End Date Taking? Authorizing Provider  amLODipine (NORVASC) 10 MG tablet Take 10 mg by mouth daily.    Historical Provider, MD  aspirin EC 81 MG tablet Take 81 mg by mouth daily.    Historical Provider, MD  CARBOPLATIN IV Inject into the vein every 21 ( twenty-one) days. To start 05/13/15    Historical Provider, MD  Cholecalciferol (VITAMIN D-3) 1000 UNITS CAPS Take 1,000 Units by mouth daily.     Historical Provider, MD  dexamethasone (DECADRON) 4  MG tablet The day before chemo take 5 tablets (35m) in the am and 5 tablets (265m in the pm. The morning of chemo take 5 tablets (2068m Patient not taking: Reported on 05/11/2015 05/07/15   ShaPatrici RanksD  feeding supplement (BOOST / RESOURCE BREEZE) LIQD Take 1 Container by mouth 3 (three) times daily between meals. Patient not taking: Reported on 05/04/2015 04/28/15   DanSamuella CotaD  ferrous sulfate 325 (65 FE) MG EC tablet Take 325 mg by mouth 2 (two) times daily.    Historical  Provider, MD  folic acid (FOLVITE) 1 MG tablet Take 1 mg by mouth daily.    Historical Provider, MD  lidocaine-prilocaine (EMLA) cream Apply a quarter size amount to port site 1 hour prior to chemo. Do not rub in. Cover with plastic wrap. Patient not taking: Reported on 05/11/2015 05/07/15   ShaPatrici RanksD  lisinopril-hydrochlorothiazide (PRINZIDE,ZESTORETIC) 20-25 MG per tablet Take 1 tablet by mouth daily.    Historical Provider, MD  lovastatin (MEVACOR) 10 MG tablet Take 10 mg by mouth at bedtime.    Historical Provider, MD  mirtazapine (REMERON) 15 MG tablet Take 7.5 mg by mouth at bedtime.    Historical Provider, MD  naproxen (NAPROSYN) 500 MG tablet Take 500 mg by mouth 2 (two) times daily as needed for mild pain or moderate pain.     Historical Provider, MD  ondansetron (ZOFRAN ODT) 4 MG disintegrating tablet Take 1 tablet (4 mg total) by mouth every 8 (eight) hours as needed for nausea or vomiting. 04/28/15   DanSamuella CotaD  ondansetron (ZOFRAN) 8 MG tablet Take 1 tablet (8 mg total) by mouth every 8 (eight) hours as needed for nausea or vomiting. Patient not taking: Reported on 05/11/2015 05/07/15   ShaPatrici RanksD  PACLitaxel (TAXOL IV) Inject into the vein every 21 ( twenty-one) days. To start 05/13/15    Historical Provider, MD  Pegfilgrastim (NEULASTA ONPRO [place]) Inject into the skin every 21 ( twenty-one) days. To be applied to skin at the end of chemo.    Historical Provider, MD  prochlorperazine (COMPAZINE) 10 MG tablet Take 1 tablet (10 mg total) by mouth every 6 (six) hours as needed (Nausea or vomiting). Patient not taking: Reported on 05/11/2015 05/07/15   ShaPatrici RanksD  QUEtiapine (SEROQUEL XR) 300 MG 24 hr tablet Take 300 mg by mouth at bedtime.    Historical Provider, MD  sulfaSALAzine (AZULFIDINE) 500 MG tablet Take 500 mg by mouth 3 (three) times daily.    Historical Provider, MD  vitamin B-12 (CYANOCOBALAMIN) 1000 MCG tablet Take 1,000 mcg by mouth daily.     Historical Provider, MD   Physical Exam: Filed Vitals:   05/13/15 1126  BP: 145/62  Pulse: 107  Temp: 98.1 F (36.7 C)  TempSrc: Oral  Height: 5' 6"  (1.676 m)  Weight: 67 kg (147 lb 11.3 oz)  SpO2: 99%    Wt Readings from Last 3 Encounters:  05/13/15 67 kg (147 lb 11.3 oz)  05/11/15 68.357 kg (150 lb 11.2 oz)  05/08/15 68.357 kg (150 lb 11.2 oz)    General:  Appears calm and comfortable Eyes: PERRL, normal lids, irises & conjunctiva ENT: grossly normal hearing, lips & tongue Neck: no LAD, masses or thyromegaly Cardiovascular: RRR, no m/r/g.  Telemetry: SR, no arrhythmias  Respiratory: CTA bilaterally, no w/r/r. Normal respiratory effort. Abdomen: soft, ntnd Skin: no rash or induration seen on limited exam Musculoskeletal:1+ pitting edema  bilaterally  Psychiatric: grossly normal mood and affect, speech fluent and appropriate Neurologic: grossly non-focal.          Labs on Admission:  Basic Metabolic Panel:  Recent Labs Lab 05/13/15 0920  NA 135  K 5.1  CL 103  CO2 23  GLUCOSE 124*  BUN 29*  CREATININE 2.74*  CALCIUM 8.7*   Liver Function Tests:  Recent Labs Lab 05/13/15 0920  AST 40  ALT 17  ALKPHOS 46  BILITOT 0.5  PROT 7.0  ALBUMIN 3.1*   No results for input(s): LIPASE, AMYLASE in the last 168 hours. No results for input(s): AMMONIA in the last 168 hours. CBC:  Recent Labs Lab 05/11/15 1125 05/13/15 0920  WBC 8.8 12.0*  NEUTROABS  --  10.6*  HGB 8.0* 7.2*  HCT 25.5* 22.5*  MCV 91.4 90.0  PLT 534* 496*   Cardiac Enzymes: No results for input(s): CKTOTAL, CKMB, CKMBINDEX, TROPONINI in the last 168 hours.  BNP (last 3 results) No results for input(s): BNP in the last 8760 hours.  ProBNP (last 3 results) No results for input(s): PROBNP in the last 8760 hours.  CBG: No results for input(s): GLUCAP in the last 168 hours.  Radiological Exams on Admission: Ir Fluoro Guide Cv Line Right  05/11/2015   CLINICAL DATA:  Ovarian  carcinoma, access for chemotherapy  EXAM: RIGHT INTERNAL JUGULAR SINGLE LUMEN POWER PORT CATHETER INSERTION  Date:  8/22/20168/22/2016 2:20 pm  Radiologist:  M. Daryll Brod, MD  Guidance:  Ultrasound fluoroscopic  FLUOROSCOPY TIME:  30 seconds 4 mGy  MEDICATIONS AND MEDICAL HISTORY: 2 g Ancefadministered within 1 hour of the procedure.2 mg Versed, 75 mcg fentanyl  ANESTHESIA/SEDATION: 30 minutes  CONTRAST:  None  COMPLICATIONS: None immediate  PROCEDURE: Informed consent was obtained from the patient following explanation of the procedure, risks, benefits and alternatives. The patient understands, agrees and consents for the procedure. All questions were addressed. A time out was performed.  Maximal barrier sterile technique utilized including caps, mask, sterile gowns, sterile gloves, large sterile drape, hand hygiene, and 2% chlorhexidine scrub.  Under sterile conditions and local anesthesia, right internal jugular micropuncture venous access was performed. Access was performed with ultrasound. Images were obtained for documentation. A guide wire was inserted followed by a transitional dilator. This allowed insertion of a guide wire and catheter into the IVC. Measurements were obtained from the SVC / RA junction back to the right IJ venotomy site. In the right infraclavicular chest, a subcutaneous pocket was created over the second anterior rib. This was done under sterile conditions and local anesthesia. 1% lidocaine with epinephrine was utilized for this. A 2.5 cm incision was made in the skin. Blunt dissection was performed to create a subcutaneous pocket over the right pectoralis major muscle. The pocket was flushed with saline vigorously. There was adequate hemostasis. The port catheter was assembled and checked for leakage. The port catheter was secured in the pocket with two retention sutures. The tubing was tunneled subcutaneously to the right venotomy site and inserted into the SVC/RA junction through a  valved peel-away sheath. Position was confirmed with fluoroscopy. Images were obtained for documentation. The patient tolerated the procedure well. No immediate complications. Incisions were closed in a two layer fashion with 4 - 0 Vicryl suture. Dermabond was applied to the skin. The port catheter was accessed, blood was aspirated followed by saline and heparin flushes. Needle was removed. A dry sterile dressing was applied.  IMPRESSION: Ultrasound and fluoroscopically guided right internal  jugular single lumen power port catheter insertion. Tip in the SVC/RA junction. Catheter ready for use.   Electronically Signed   By: Jerilynn Mages.  Shick M.D.   On: 05/11/2015 15:01   Ir US Guide Vasc Access Right  05/11/2015   CLINICAL DATA:  Ovarian carcinoma, access for chemotherapy  EXAM: RIGHT INTERNAL JUGULAR SINGLE LUMEN POWER PORT CATHETER INSERTION  Date:  8/22/20168/22/2016 2:20 pm  Radiologist:  M. Daryll Brod, MD  Guidance:  Ultrasound fluoroscopic  FLUOROSCOPY TIME:  30 seconds 4 mGy  MEDICATIONS AND MEDICAL HISTORY: 2 g Ancefadministered within 1 hour of the procedure.2 mg Versed, 75 mcg fentanyl  ANESTHESIA/SEDATION: 30 minutes  CONTRAST:  None  COMPLICATIONS: None immediate  PROCEDURE: Informed consent was obtained from the patient following explanation of the procedure, risks, benefits and alternatives. The patient understands, agrees and consents for the procedure. All questions were addressed. A time out was performed.  Maximal barrier sterile technique utilized including caps, mask, sterile gowns, sterile gloves, large sterile drape, hand hygiene, and 2% chlorhexidine scrub.  Under sterile conditions and local anesthesia, right internal jugular micropuncture venous access was performed. Access was performed with ultrasound. Images were obtained for documentation. A guide wire was inserted followed by a transitional dilator. This allowed insertion of a guide wire and catheter into the IVC. Measurements were obtained  from the SVC / RA junction back to the right IJ venotomy site. In the right infraclavicular chest, a subcutaneous pocket was created over the second anterior rib. This was done under sterile conditions and local anesthesia. 1% lidocaine with epinephrine was utilized for this. A 2.5 cm incision was made in the skin. Blunt dissection was performed to create a subcutaneous pocket over the right pectoralis major muscle. The pocket was flushed with saline vigorously. There was adequate hemostasis. The port catheter was assembled and checked for leakage. The port catheter was secured in the pocket with two retention sutures. The tubing was tunneled subcutaneously to the right venotomy site and inserted into the SVC/RA junction through a valved peel-away sheath. Position was confirmed with fluoroscopy. Images were obtained for documentation. The patient tolerated the procedure well. No immediate complications. Incisions were closed in a two layer fashion with 4 - 0 Vicryl suture. Dermabond was applied to the skin. The port catheter was accessed, blood was aspirated followed by saline and heparin flushes. Needle was removed. A dry sterile dressing was applied.  IMPRESSION: Ultrasound and fluoroscopically guided right internal jugular single lumen power port catheter insertion. Tip in the SVC/RA junction. Catheter ready for use.   Electronically Signed   By: Jerilynn Mages.  Shick M.D.   On: 05/11/2015 15:01     Assessment/Plan Active Problems:   * No active hospital problems. *   1. AKI possibly related to dehydration in the setting of ACE/ HCTZ. Discontinue lisinopril-hydrochlorothiazide since this may be the source of kidney injury. Hold other nephrotic agents including NSAIDs, will give a trial of IVF and check UA. If creatinine fails to improve by tomorrow can consider further workup/ nephrology consult.  2. 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 As needed Imodium   3. Normocytic anemia. Likely related to  underling malignancy, no evidence of bleeding at this time will transfuse 2 units PRBC today per oncology recommendations. Check anemia panel. Current hemoglobin in 7.2 and she is likely hemoconcentrated. I anticipate her hemoglobin will trend down further with IV fluids. 4. Chronic nausea. Continue Zofran.  5. Ovarian cancer. Follow up with oncology for further chemo.  6.  Schizophrenia. Continue Seroquel   Code Status: Full DVT Prophylaxis: SCD Family Communication: Family  Bedside. Discussed with patient who understands and has no concerns at this time. Disposition Plan: Discharge home in 1-2 days.   Time spent:  50 minutes   Kathie Dike, MD [facility] Pager [phone]

## 2015-05-13 NOTE — Progress Notes (Signed)
Patient taken to the third floor for observation per MD discretion.  Patient educated on the plan for further labs and possible treatment tomorrow depending on labs.

## 2015-05-14 LAB — CBC
HCT: 30.2 % — ABNORMAL LOW (ref 36.0–46.0)
HEMOGLOBIN: 9.8 g/dL — AB (ref 12.0–15.0)
MCH: 28.5 pg (ref 26.0–34.0)
MCHC: 32.5 g/dL (ref 30.0–36.0)
MCV: 87.8 fL (ref 78.0–100.0)
Platelets: 426 10*3/uL — ABNORMAL HIGH (ref 150–400)
RBC: 3.44 MIL/uL — ABNORMAL LOW (ref 3.87–5.11)
RDW: 16.5 % — ABNORMAL HIGH (ref 11.5–15.5)
WBC: 11.4 10*3/uL — AB (ref 4.0–10.5)

## 2015-05-14 LAB — BASIC METABOLIC PANEL
Anion gap: 6 (ref 5–15)
BUN: 29 mg/dL — ABNORMAL HIGH (ref 6–20)
CALCIUM: 8.2 mg/dL — AB (ref 8.9–10.3)
CHLORIDE: 106 mmol/L (ref 101–111)
CO2: 23 mmol/L (ref 22–32)
CREATININE: 1.63 mg/dL — AB (ref 0.44–1.00)
GFR calc non Af Amer: 31 mL/min — ABNORMAL LOW (ref >60)
GFR, EST AFRICAN AMERICAN: 36 mL/min — AB (ref >60)
Glucose, Bld: 118 mg/dL — ABNORMAL HIGH (ref 65–99)
Potassium: 4 mmol/L (ref 3.5–5.1)
SODIUM: 135 mmol/L (ref 135–145)

## 2015-05-14 LAB — TYPE AND SCREEN
ABO/RH(D): A POS
ANTIBODY SCREEN: NEGATIVE
UNIT DIVISION: 0
UNIT DIVISION: 0

## 2015-05-14 MED ORDER — METOPROLOL TARTRATE 25 MG PO TABS
25.0000 mg | ORAL_TABLET | Freq: Two times a day (BID) | ORAL | Status: DC
  Administered 2015-05-14 – 2015-05-15 (×3): 25 mg via ORAL
  Filled 2015-05-14 (×3): qty 1

## 2015-05-14 NOTE — Progress Notes (Addendum)
Initial Nutrition Assessment  DOCUMENTATION CODES:  Non-severe (moderate) malnutrition in context of chronic illness  INTERVENTION:  Ensure Enlive po BID, each supplement provides 350 kcal and 20 grams of protein  Gave some cancer regarding protein/calorie intake during cancer treatment: Left handouts titled "Soft and Moist High protein Menu Ideas" and "Increasing Calories and Protein". Left contact info and coupons.   NUTRITION DIAGNOSIS:  Increased nutrient needs related to cancer and cancer related treatments as evidenced by loss of 4.5% bw in 2 weeks  GOAL:  Patient will meet greater than or equal to 90% of their needs  MONITOR:  PO intake, Supplement acceptance, Labs, Weight trends  REASON FOR ASSESSMENT:  Malnutrition Screening Tool    ASSESSMENT:  74 y.o. female PMHx HTN, UC, HLD, anemia, and Ovarian Cancer presented to the cancer center today for scheduled chemotherapy, labs were drawn and she was found to have elevated creatinine. Admitted for AKI/observation. Pt reports that she was in her usual state of health and did not have any specific complaints.  Pt reports that she has had poor intake in the past couple months. This is one of the ways she discovered her malignancy; she would exhibit severe n/v upon eating. Since that time she has not had much n/v/c. She eats about 1-2 meals a day and snacks in between.  Gave some education regarding which snacks she should choose. She states she eats carbohydrate heavy snacks. I advised to choose options that contain some protein. We also discussed a few ideas to help her increase her calorie/protein intake. She does have Ensure supplements and enjoys them. Will provide her with more coupons.   She does have diarrhea but she says that is her crohns disease (UC?).   Her normal weight is 160 lbs.Documention shows apparent loss of 4.5% in 1 months time.  No recorded weight hx for >1 months time.    NFPE: WDL  Diet Order:  Diet Heart  Room service appropriate?: Yes; Fluid consistency:: Thin  Skin:  Reviewed, no issues  Last BM:  8/25  Height:  Ht Readings from Last 1 Encounters:  05/13/15 5' 6"  (1.676 m)   Weight:  Wt Readings from Last 1 Encounters:  05/13/15 147 lb 11.3 oz (67 kg)   Wt Readings from Last 10 Encounters:  05/13/15 147 lb 11.3 oz (67 kg)  05/11/15 150 lb 11.2 oz (68.357 kg)  05/08/15 150 lb 11.2 oz (68.357 kg)  05/04/15 152 lb 14.4 oz (69.355 kg)  04/28/15 154 lb 11.2 oz (70.171 kg)   Ideal Body Weight:  59.1 kg  BMI:  Body mass index is 23.85 kg/(m^2).  Estimated Nutritional Needs:  Kcal:  1650-1800 kcals (25-27 kcal/kg) Protein:  67-80 (1-1.2g/kg bw) Fluid:  1.7-1.8 liters  EDUCATION NEEDS:  Education needs addressed  Burtis Junes RD, LDN Nutrition Pager: [phone] 05/14/2015 3:18 PM

## 2015-05-14 NOTE — Care Management Note (Signed)
Case Management Note  Patient Details  Name: Latasha [name] MRN: [id number] Date of Birth: 09-30-1940  Subjective/Objective:                  Pt admitted from home with ARF. Pt lives alone and will return home at discharge. Pt is independent with ADL's. Pts son lives next door and is very active in the care of the pt. Pts PCP is with [facility].  Action/Plan: No Cm needs noted.  Expected Discharge Date:                  Expected Discharge Plan:  Home/Self Care  In-House Referral:  NA  Discharge planning Services  CM Consult  Post Acute Care Choice:  NA Choice offered to:  NA  DME Arranged:    DME Agency:     HH Arranged:    HH Agency:     Status of Service:  Completed, signed off  Medicare Important Message Given:    Date Medicare IM Given:    Medicare IM give by:    Date Additional Medicare IM Given:    Additional Medicare Important Message give by:     If discussed at [place] of Stay Meetings, dates discussed:    Additional Comments:  Joylene Draft, RN 05/14/2015, 1:22 PM

## 2015-05-14 NOTE — Progress Notes (Signed)
[facility] PROGRESS NOTE  Latasha [name] EGB:[id number] DOB: 1941/08/28 DOA: 05/13/2015 PCP: No PCP Per Patient  Assessment/Plan: 1. AKI possibly related to dehydration in the setting of ACE/ HCTZ. Discontinue lisinopril-hydrochlorothiazide since this may be the source of kidney injury. Hold other nephrotic agents including NSAIDs. Pt was started on IVF and creatinine has improved. I anticipate that she will need another 24 hours of IVF. UA was unremarkable.  2. 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 As needed Imodium . Continue sulfasalazine 3. Normocytic anemia. Likely related to underling malignancy/chronic disease, no evidence of bleeding at this time. Patient was  transfused 2 units PRBC yesterday per oncology recommendations. Current hemoglobin in 9.8. Will continue to follow up.  4. Chronic nausea. Continue Zofran.  5. Ovarian cancer. Follow up with oncology for further chemo.  6. Schizophrenia. Continue Seroquel 7. Hypertension. Ace inhibitor held on admission due to renal failure. She's on amlodpine but BP remains elevated. Will add Lopressor.  Code Status: Full Family Communication: Discussed with patient, no family present Disposition Plan: discharged home tomorrow if creatinine improves   Consultants:    Procedures:    Antibiotics:  HPI/Subjective: Pt is improving. Denies being lightheaded or dizzy. Improved urine output. No nausea or vomiting, but reports an episode of diarrhea this morning.No bloody stool. Did not take anti-diarrhea medication.  Objective: Filed Vitals:   05/14/15 0438  BP: 160/78  Pulse: 113  Temp: 98.5 F (36.9 C)  Resp: 20    Intake/Output Summary (Last 24 hours) at 05/14/15 1156 Last data filed at 05/14/15 0958  Gross per 24 hour  Intake   1645 ml  Output   1000 ml  Net    645 ml   Filed Weights   05/13/15 1126 05/13/15 1135  Weight: 67 kg (147 lb 11.3 oz) 67 kg (147 lb 11.3 oz)    Exam:   General:   Sitting comfortably in bed.  Cardiovascular: RRR, S1, S2, no murmurs, rubs or gallops  Respiratory: CTAB  Abdomen: Soft, nontender, positive bowel sounds.  Musculoskeletal: Trace edema bilaterally. FROM  Data Reviewed: Basic Metabolic Panel:  Recent Labs Lab 05/13/15 0920 05/14/15 1053  NA 135 135  K 5.1 4.0  CL 103 106  CO2 23 23  GLUCOSE 124* 118*  BUN 29* 29*  CREATININE 2.74* 1.63*  CALCIUM 8.7* 8.2*   Liver Function Tests:  Recent Labs Lab 05/13/15 0920  AST 40  ALT 17  ALKPHOS 46  BILITOT 0.5  PROT 7.0  ALBUMIN 3.1*   No results for input(s): LIPASE, AMYLASE in the last 168 hours. No results for input(s): AMMONIA in the last 168 hours. CBC:  Recent Labs Lab 05/11/15 1125 05/13/15 0920 05/14/15 1053  WBC 8.8 12.0* 11.4*  NEUTROABS  --  10.6*  --   HGB 8.0* 7.2* 9.8*  HCT 25.5* 22.5* 30.2*  MCV 91.4 90.0 87.8  PLT 534* 496* 426*   Cardiac Enzymes: No results for input(s): CKTOTAL, CKMB, CKMBINDEX, TROPONINI in the last 168 hours. BNP (last 3 results) No results for input(s): BNP in the last 8760 hours.  ProBNP (last 3 results) No results for input(s): PROBNP in the last 8760 hours.  CBG: No results for input(s): GLUCAP in the last 168 hours.  No results found for this or any previous visit (from the past 240 hour(s)).   Studies: No results found.  Scheduled Meds: . amLODipine  10 mg Oral Daily  . aspirin EC  81 mg Oral Daily  . feeding supplement (ENSURE  ENLIVE)  237 mL Oral BID BM  . folic acid  1 mg Oral Daily  . mirtazapine  7.5 mg Oral QHS  . pravastatin  10 mg Oral q1800  . QUEtiapine  300 mg Oral QHS  . sulfaSALAzine  500 mg Oral TID   Continuous Infusions: . sodium chloride 1 mL (05/14/15 0209)    Active Problems:   Ovarian cancer   Acute kidney injury   Normocytic anemia   Schizophrenia, unspecified type   Acute renal failure    Time spent: 25 minutes     Norg'E Tisdol  [facility] Pager [phone]. If  7PM-7AM, please contact night-coverage at [email or web address], password [facility] 05/14/2015, 11:56 AM  LOS: 1 day       I, Norg'e Tisdol, acting a scribe, recorded this note contemporaneously in the presence of Dr. Kathie Dike, M.D. on 05/14/2015 at 11:56 AM   I have reviewed the above documentation for accuracy and completeness, and I agree with the above.  MEMON,JEHANZEB

## 2015-05-15 LAB — CBC
HEMATOCRIT: 30.7 % — AB (ref 36.0–46.0)
HEMOGLOBIN: 10.1 g/dL — AB (ref 12.0–15.0)
MCH: 28.9 pg (ref 26.0–34.0)
MCHC: 32.9 g/dL (ref 30.0–36.0)
MCV: 87.7 fL (ref 78.0–100.0)
Platelets: 422 10*3/uL — ABNORMAL HIGH (ref 150–400)
RBC: 3.5 MIL/uL — AB (ref 3.87–5.11)
RDW: 16.4 % — ABNORMAL HIGH (ref 11.5–15.5)
WBC: 9.9 10*3/uL (ref 4.0–10.5)

## 2015-05-15 LAB — BASIC METABOLIC PANEL
Anion gap: 6 (ref 5–15)
BUN: 23 mg/dL — AB (ref 6–20)
CHLORIDE: 106 mmol/L (ref 101–111)
CO2: 25 mmol/L (ref 22–32)
CREATININE: 1.23 mg/dL — AB (ref 0.44–1.00)
Calcium: 8.3 mg/dL — ABNORMAL LOW (ref 8.9–10.3)
GFR, EST AFRICAN AMERICAN: 50 mL/min — AB (ref >60)
GFR, EST NON AFRICAN AMERICAN: 43 mL/min — AB (ref >60)
Glucose, Bld: 90 mg/dL (ref 65–99)
Potassium: 4.3 mmol/L (ref 3.5–5.1)
SODIUM: 137 mmol/L (ref 135–145)

## 2015-05-15 MED ORDER — METOPROLOL TARTRATE 25 MG PO TABS: 25.0000 mg | ORAL_TABLET | Freq: Two times a day (BID) | ORAL | Status: DC

## 2015-05-15 MED ORDER — HEPARIN SOD (PORK) LOCK FLUSH 100 UNIT/ML IV SOLN
500.0000 [IU] | INTRAVENOUS | Status: AC | PRN
  Administered 2015-05-15: 500 [IU]
  Filled 2015-05-15: qty 5

## 2015-05-15 NOTE — Care Management Note (Signed)
Case Management Note  Patient Details  Name: Latasha [name] MRN: [id number] Date of Birth: [birth year]  Subjective/Objective:                    Action/Plan:   Expected Discharge Date:                  Expected Discharge Plan:  Home/Self Care  In-House Referral:  NA  Discharge planning Services  CM Consult  Post Acute Care Choice:  NA Choice offered to:  NA  DME Arranged:    DME Agency:     HH Arranged:    HH Agency:     Status of Service:  Completed, signed off  Medicare Important Message Given:    Date Medicare IM Given:    Medicare IM give by:    Date Additional Medicare IM Given:    Additional Medicare Important Message give by:     If discussed at [place] of Stay Meetings, dates discussed:    Additional Comments: Pt discharged home today. No CM needs noted. Christinia Gully [place], RN 05/15/2015, 9:53 AM

## 2015-05-15 NOTE — Progress Notes (Signed)
Latasha [name] to be D/C'd Home per MD order.  Discussed prescriptions and follow up appointments with the patient. Prescriptions given to patient, medication list explained in detail. Pt verbalized understanding.    Medication List    TAKE these medications        amLODipine 10 MG tablet  Commonly known as:  NORVASC  Take 10 mg by mouth daily.     aspirin EC 81 MG tablet  Take 81 mg by mouth daily.     CARBOPLATIN IV  Inject into the vein every 21 ( twenty-one) days. To start 05/13/15     dexamethasone 4 MG tablet  Commonly known as:  DECADRON  The day before chemo take 5 tablets (12m) in the am and 5 tablets (248m in the pm. The morning of chemo take 5 tablets (202m     ferrous sulfate 325 (65 FE) MG EC tablet  Take 325 mg by mouth 2 (two) times daily.     folic acid 1 MG tablet  Commonly known as:  FOLVITE  Take 1 mg by mouth daily.     lidocaine-prilocaine cream  Commonly known as:  EMLA  Apply a quarter size amount to port site 1 hour prior to chemo. Do not rub in. Cover with plastic wrap.     lovastatin 10 MG tablet  Commonly known as:  MEVACOR  Take 10 mg by mouth at bedtime.     metoprolol tartrate 25 MG tablet  Commonly known as:  LOPRESSOR  Take 1 tablet (25 mg total) by mouth 2 (two) times daily.     mirtazapine 15 MG tablet  Commonly known as:  REMERON  Take 7.5 mg by mouth at bedtime.     multivitamin with minerals Tabs tablet  Take 1 tablet by mouth daily.     NEULASTA ONPRO [place]  Inject into the skin every 21 ( twenty-one) days. To be applied to skin at the end of chemo.     ondansetron 4 MG disintegrating tablet  Commonly known as:  ZOFRAN ODT  Take 1 tablet (4 mg total) by mouth every 8 (eight) hours as needed for nausea or vomiting.     ondansetron 8 MG tablet  Commonly known as:  ZOFRAN  Take 1 tablet (8 mg total) by mouth every 8 (eight) hours as needed for nausea or vomiting.     prochlorperazine 10 MG tablet  Commonly known as:   COMPAZINE  Take 1 tablet (10 mg total) by mouth every 6 (six) hours as needed (Nausea or vomiting).     QUEtiapine 300 MG 24 hr tablet  Commonly known as:  SEROQUEL XR  Take 300 mg by mouth at bedtime.     sulfaSALAzine 500 MG tablet  Commonly known as:  AZULFIDINE  Take 500 mg by mouth 3 (three) times daily.     TAXOL IV  Inject into the vein every 21 ( twenty-one) days. To start 05/13/15     vitamin B-12 1000 MCG tablet  Commonly known as:  CYANOCOBALAMIN  Take 1,000 mcg by mouth daily.     Vitamin D-3 1000 UNITS Caps  Take 1,000 Units by mouth daily.        Filed Vitals:   05/15/15 1041  BP: 132/78  Pulse:   Temp:   Resp:     Skin clean, dry and intact without evidence of skin break down, no evidence of skin tears noted. Chest port catheter deaccessed flushed with normal saline then heparin. Site intact without  signs and symptoms of complications. Dressing and pressure applied. Pt denies pain at this time. No complaints noted.  An After Visit Summary was printed and given to the patient. Informed patient that cancer center was closed this weekend and to make a follow-up appointment on Monday. Provided education information on AKI and Metoprolol Patient escorted via [place], and D/C home via private auto.  Retta Mac BSN, RN

## 2015-05-15 NOTE — Care Management Important Message (Signed)
Important Message  Patient Details  Name: Cozette Braggs MRN: [id number] Date of Birth: 09-16-1941   Medicare Important Message Given:  Yes-second notification given    Joylene Draft, RN 05/15/2015, 9:53 AM

## 2015-05-15 NOTE — Clinical Documentation Improvement (Signed)
Hospitalist  Per nutritionist evaluation patient meets criteria for moderate malnutrition.  Please document nutritional diagnosis if appropriate for this admission.  Thank you     Malnutrition ( severity Mild, Moderate , Severe)  Other condition  Unable to clinically determine   Supporting Information: :nutritionist evaluation   "Increased nutrient needs related to cancer and cancer related treatments as evidenced by loss of 4.5% bw in 2 weeks " "Documention shows apparent loss of 4.5% in 1 months time"     Please exercise your independent, professional judgment when responding. A specific answer is not anticipated or expected.   Thank You, [place]

## 2015-05-15 NOTE — Discharge Summary (Signed)
Physician Discharge Summary  Latasha [name] LGX:[id number] DOB: 1941-09-04 DOA: 05/13/2015  PCP: No PCP Per Patient  Admit date: 05/13/2015 Discharge date: 05/15/2015  Time spent: 35 minutes  Recommendations for Outpatient Follow-up:  1. Follow up with oncologist for chemo treatment as scheduled.   Discharge Diagnoses:  Active Problems:   Ovarian cancer   Acute kidney injury   Normocytic anemia   Schizophrenia, unspecified type   Acute renal failure   Discharge Condition: Improved   Diet recommendation: Heart healthy diet  Filed Weights   05/13/15 1126 05/13/15 1135  Weight: 67 kg (147 lb 11.3 oz) 67 kg (147 lb 11.3 oz)    History of present illness:  Patient was in [place] 05/13/2015 for her first round of chemotherapy and while in the office her labs were concerning for acute kidney Injury. Her oncologist, Dr Whitney Muse, asked for her to be admitted for AKI treatment.  Hospital Course:   AKI possibly related to dehydration in the setting of ACE/ HCTZ. Discontinue lisinopril-hydrochlorothiazide since this may be the source of kidney injury. Hold other nephrotic agents including NSAIDs. Pt was started on IVF and creatinine has improved. She's been encouraged to continue oral hydration at home.  Diarrhea appears to be stable. Secondary to Crohn's disease. As needed Imodium . Continue sulfasalazine  Normocytic anemia. Likely related to underling malignancy/chronic disease, no evidence of bleeding at this time. Patient was transfused 2 units PRBC per oncology recommendations. Current hemoglobin at 10.1 and stable.   Chronic nausea. Continue Zofran.   Ovarian cancer. Follow up with oncology for further chemo.   Schizophrenia. Continue Seroquel  Hypertension. Ace inhibitor held on admission due to renal failure. She's on amlodpine but BP remains elevated. Lopressor was added with improvement of BP.  Procedures:  Transfused 2 units  PRBC  Consultations:    Discharge Exam: Filed Vitals:   05/15/15 0522  BP: 167/81  Pulse: 68  Temp: 98.5 F (36.9 C)  Resp: 20    General: Sitting comfortably in bed.   Cardiovascular: Regular Rate and Rhythm, S1, S2, no M/R/G  Respiratory: Clear to auscultation bilaterally  Abdomen: Soft, no TTP, + bowel sounds, no distention  Neurological: AOx3  Musculoskeletal: Trace edema bilaterally. FROM  Discharge Instructions    Current Discharge Medication List    CONTINUE these medications which have NOT CHANGED   Details  amLODipine (NORVASC) 10 MG tablet Take 10 mg by mouth daily.    aspirin EC 81 MG tablet Take 81 mg by mouth daily.    Cholecalciferol (VITAMIN D-3) 1000 UNITS CAPS Take 1,000 Units by mouth daily.    Associated Diagnoses: Ovarian cancer, unspecified laterality    ferrous sulfate 325 (65 FE) MG EC tablet Take 325 mg by mouth 2 (two) times daily.    folic acid (FOLVITE) 1 MG tablet Take 1 mg by mouth daily.    lovastatin (MEVACOR) 10 MG tablet Take 10 mg by mouth at bedtime.    mirtazapine (REMERON) 15 MG tablet Take 7.5 mg by mouth at bedtime.    Multiple Vitamin (MULTIVITAMIN WITH MINERALS) TABS tablet Take 1 tablet by mouth daily.    ondansetron (ZOFRAN ODT) 4 MG disintegrating tablet Take 1 tablet (4 mg total) by mouth every 8 (eight) hours as needed for nausea or vomiting. Qty: 30 tablet, Refills: 0    QUEtiapine (SEROQUEL XR) 300 MG 24 hr tablet Take 300 mg by mouth at bedtime.    sulfaSALAzine (AZULFIDINE) 500 MG tablet Take 500 mg by mouth 3 (  three) times daily.    vitamin B-12 (CYANOCOBALAMIN) 1000 MCG tablet Take 1,000 mcg by mouth daily.    CARBOPLATIN IV Inject into the vein every 21 ( twenty-one) days. To start 05/13/15   Associated Diagnoses: Ovarian cancer, unspecified laterality    dexamethasone (DECADRON) 4 MG tablet The day before chemo take 5 tablets (33m) in the am and 5 tablets (234m in the pm. The morning of chemo take  5 tablets (2020m Qty: 30 tablet, Refills: 1   Associated Diagnoses: Ovarian cancer, unspecified laterality    lidocaine-prilocaine (EMLA) cream Apply a quarter size amount to port site 1 hour prior to chemo. Do not rub in. Cover with plastic wrap. Qty: 30 g, Refills: 3   Associated Diagnoses: Ovarian cancer, unspecified laterality    ondansetron (ZOFRAN) 8 MG tablet Take 1 tablet (8 mg total) by mouth every 8 (eight) hours as needed for nausea or vomiting. Qty: 30 tablet, Refills: 2   Associated Diagnoses: Ovarian cancer, unspecified laterality    PACLitaxel (TAXOL IV) Inject into the vein every 21 ( twenty-one) days. To start 05/13/15   Associated Diagnoses: Ovarian cancer, unspecified laterality    Pegfilgrastim (NEULASTA ONPRO [place]) Inject into the skin every 21 ( twenty-one) days. To be applied to skin at the end of chemo.   Associated Diagnoses: Ovarian cancer, unspecified laterality    prochlorperazine (COMPAZINE) 10 MG tablet Take 1 tablet (10 mg total) by mouth every 6 (six) hours as needed (Nausea or vomiting). Qty: 30 tablet, Refills: 1   Associated Diagnoses: Ovarian cancer, unspecified laterality       No Known Allergies    The results of significant diagnostics from this hospitalization (including imaging, microbiology, ancillary and laboratory) are listed below for reference.    Significant Diagnostic Studies: Ct Abdomen Pelvis Wo Contrast  04/25/2015   CLINICAL DATA:  Vomiting and diarrhea for 2 weeks, shortness of breath, dizziness, leg swelling, history hypertension, ulcerative colitis  EXAM: CT ABDOMEN AND PELVIS WITHOUT CONTRAST  TECHNIQUE: Multidetector CT imaging of the abdomen and pelvis was performed following the standard protocol without IV contrast. IV contrast not utilized due to renal dysfunction, creatinine 2.69. Sagittal and coronal MPR images reconstructed from axial data set. Patient drank dilute oral contrast for exam.  COMPARISON:  None  FINDINGS:  Minimal RIGHT pleural effusion.  Within limits of a nonenhanced exam, liver, spleen, pancreas, kidneys, and adrenal glands normal. Normal appendix.  Cystocele present with significant inferior descent of urinary bladder in pelvis.  Extensive ascites.  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  These masses measure up to 5.1 cm in greatest dimension.  Uterus is small and atrophic with multiple calcifications.  Numerous pelvic phleboliths.  Stomach and bowel loops displaced by ascites but otherwise unremarkable.  Upper normal sized to borderline enlarged retroperitoneal nodes without definite adenopathy.  No definite free air, hernia, or acute osseous findings.  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  Cystocele.   Electronically Signed   By: MarLavonia DanaD.   On: 04/25/2015 15:04   Ct Chest Wo Contrast  04/27/2015   CLINICAL DATA:  73 3ar old female with peritoneal carcinomatosis.  EXAM: CT CHEST WITHOUT CONTRAST  TECHNIQUE: Multidetector CT imaging of the chest was performed following the standard protocol without IV contrast.  COMPARISON:  CT abdomen/pelvis from 04/25/2015.  FINDINGS: Mediastinum/Nodes:  Normal heart size. Mild pericardial fluid/thickening. Mildly atherosclerotic nonaneurysmal thoracic aorta. Normal caliber pulmonary arteries. Low-density cardiac blood pool, indicating anemia. Normal visualized thyroid. Normal esophagus. No axillary lymphadenopathy. Multiple clustered mildly enlarged right pericardial phrenic lymph nodes, largest 1.2 cm short axis (series 2/image 47). Solitary mildly enlarged 1.1 cm left pericardial phrenic node (2/52). Mildly enlarged 1.3 cm prevascular mediastinal node (2/18). Mildly enlarged 1.1 cm subcarinal node (2/29). No gross hilar  adenopathy, noting limited sensitivity for the detection of hilar lymphadenopathy on a noncontrast study. There is a suggestion of mild left supraclavicular lymphadenopathy, largest 1.1 cm (2/8).  Lungs/Pleura: Small right and trace left pleural effusions, increased bilaterally. No pneumothorax. Nonspecific 3 mm right vocal cord polyp (3/3). Right middle lobe 3 mm subpleural pulmonary nodule (3/37) and separate 3 mm right middle lobe pulmonary nodule (3/38). Segmental basilar right lower lobe atelectasis. 3 mm superior segment left lower lobe pulmonary nodule associated with the left major fissure (3/22). Subsegmental left lower lobe atelectasis.  Upper abdomen: Small volume upper abdominal ascites, decreased. Stable abnormal fat stranding throughout the visualized upper peritoneal cavity, with multiple nodular tumor implants, in keeping with peritoneal carcinomatosis, largest 2.8 cm in the left upper quadrant (2/ 60) .  Musculoskeletal: Mild to moderate degenerative changes in the thoracic spine. No suspicious focal osseous lesions.  IMPRESSION: 1. Mild left supraclavicular lymphadenopathy and moderate mediastinal lymphadenopathy involving the prevascular, subcarinal and bilateral pericardiophrenic nodal chains, likely metastatic. 2. Small right and trace left pleural effusions, increased bilaterally, with associated bibasilar atelectasis. Mild pericardial fluid/thickening. 3. Three tiny pulmonary nodules, largest 3 mm, indeterminate. Recommend follow-up chest CT in 3 months. 4. Decreased small volume upper abdominal ascites. Stable findings of peritoneal carcinomatosis in the visualized upper abdomen. 5. Nonspecific 3 mm right vocal cord polyp. 6. Anemia.   Electronically Signed   By: Ilona Sorrel M.D.   On: 04/27/2015 13:27   US Transvaginal Non-ob  04/27/2015   CLINICAL DATA:  Peritoneal carcinomatosis  EXAM: TRANSABDOMINAL AND TRANSVAGINAL ULTRASOUND OF PELVIS  TECHNIQUE: Both transabdominal and transvaginal  ultrasound examinations of the pelvis were performed. Transabdominal technique was performed for global imaging of the pelvis including uterus, ovaries, adnexal regions, and pelvic cul-de-sac. It was necessary to proceed with endovaginal exam following the transabdominal exam to visualize the uterus and adnexa.  COMPARISON:  CT 04/25/2015  FINDINGS: Uterus  Measurements: 7.0 x 2.6 x 4.6 cm. Small uterine calcifications. Fluid in the uterine cavity measuring 2.5 mm in thickness. No definite uterine mass.  Endometrium  Thickness: Small amount of fluid in the uterine cavity. No in and material mass identified. No focal abnormality visualized.  Right ovary  Measurements: . Not identified separate firm adnexal masses.  Left ovary  Measurements: . Not identified separate from adnexal masses.  Other findings  Large amount of free fluid which has internal echoes suggesting blood or possibly tumor or infection. Multiple soft tissue masses throughout the adnexum consistent with peritoneal carcinomatosis.  IMPRESSION: Ascites and multiple peritoneal soft tissue masses compatible with peritoneal tumor. This could be of ovarian etiology but could also be metastatic. Discrete ovaries not identified.  Small amount of free fluid in the uterine cavity. No uterine mass identified.   Electronically Signed   By: Franchot Gallo M.D.   On: 04/27/2015 13:27   US Pelvis Complete  04/27/2015   CLINICAL DATA:  Peritoneal carcinomatosis  EXAM: TRANSABDOMINAL AND TRANSVAGINAL ULTRASOUND OF PELVIS  TECHNIQUE: Both transabdominal and transvaginal ultrasound examinations of the pelvis were performed. Transabdominal technique  was performed for global imaging of the pelvis including uterus, ovaries, adnexal regions, and pelvic cul-de-sac. It was necessary to proceed with endovaginal exam following the transabdominal exam to visualize the uterus and adnexa.  COMPARISON:  CT 04/25/2015  FINDINGS: Uterus  Measurements: 7.0 x 2.6 x 4.6 cm. Small  uterine calcifications. Fluid in the uterine cavity measuring 2.5 mm in thickness. No definite uterine mass.  Endometrium  Thickness: Small amount of fluid in the uterine cavity. No in and material mass identified. No focal abnormality visualized.  Right ovary  Measurements: . Not identified separate firm adnexal masses.  Left ovary  Measurements: . Not identified separate from adnexal masses.  Other findings  Large amount of free fluid which has internal echoes suggesting blood or possibly tumor or infection. Multiple soft tissue masses throughout the adnexum consistent with peritoneal carcinomatosis.  IMPRESSION: Ascites and multiple peritoneal soft tissue masses compatible with peritoneal tumor. This could be of ovarian etiology but could also be metastatic. Discrete ovaries not identified.  Small amount of free fluid in the uterine cavity. No uterine mass identified.   Electronically Signed   By: Franchot Gallo M.D.   On: 04/27/2015 13:27   US Paracentesis  04/27/2015   CLINICAL DATA:  Malignant ascites  EXAM: ULTRASOUND GUIDED  PARACENTESIS  COMPARISON:  CT abdomen pelvis 04/25/2015  PROCEDURE: An ultrasound guided paracentesis was thoroughly discussed with the patient and questions answered. The benefits, risks, alternatives and complications were also discussed. The patient understands and wishes to proceed with the procedure. Written consent was obtained.  Ultrasound was performed to localize and mark an adequate pocket of fluid in the left lower quadrant of the abdomen. The area was then prepped and draped in the normal sterile fashion. 1% Lidocaine was used for local anesthesia. Under ultrasound guidance a 19 gauge Yueh catheter was introduced. Paracentesis was performed. The catheter was removed and a dressing applied.  COMPLICATIONS: None.  FINDINGS: A total of approximately 3700 mL of amber colored fluid was removed. A fluid sample was sent for laboratory analysis.  IMPRESSION: Successful ultrasound  guided paracentesis yielding 3700 mL of ascites.   Electronically Signed   By: Franchot Gallo M.D.   On: 04/27/2015 13:03   Ir Fluoro Guide Cv Line Right  05/11/2015   CLINICAL DATA:  Ovarian carcinoma, access for chemotherapy  EXAM: RIGHT INTERNAL JUGULAR SINGLE LUMEN POWER PORT CATHETER INSERTION  Date:  8/22/20168/22/2016 2:20 pm  Radiologist:  M. Daryll Brod, MD  Guidance:  Ultrasound fluoroscopic  FLUOROSCOPY TIME:  30 seconds 4 mGy  MEDICATIONS AND MEDICAL HISTORY: 2 g Ancefadministered within 1 hour of the procedure.2 mg Versed, 75 mcg fentanyl  ANESTHESIA/SEDATION: 30 minutes  CONTRAST:  None  COMPLICATIONS: None immediate  PROCEDURE: Informed consent was obtained from the patient following explanation of the procedure, risks, benefits and alternatives. The patient understands, agrees and consents for the procedure. All questions were addressed. A time out was performed.  Maximal barrier sterile technique utilized including caps, mask, sterile gowns, sterile gloves, large sterile drape, hand hygiene, and 2% chlorhexidine scrub.  Under sterile conditions and local anesthesia, right internal jugular micropuncture venous access was performed. Access was performed with ultrasound. Images were obtained for documentation. A guide wire was inserted followed by a transitional dilator. This allowed insertion of a guide wire and catheter into the IVC. Measurements were obtained from the SVC / RA junction back to the right IJ venotomy site. In the right infraclavicular chest, a subcutaneous pocket was created  over the second anterior rib. This was done under sterile conditions and local anesthesia. 1% lidocaine with epinephrine was utilized for this. A 2.5 cm incision was made in the skin. Blunt dissection was performed to create a subcutaneous pocket over the right pectoralis major muscle. The pocket was flushed with saline vigorously. There was adequate hemostasis. The port catheter was assembled and checked for  leakage. The port catheter was secured in the pocket with two retention sutures. The tubing was tunneled subcutaneously to the right venotomy site and inserted into the SVC/RA junction through a valved peel-away sheath. Position was confirmed with fluoroscopy. Images were obtained for documentation. The patient tolerated the procedure well. No immediate complications. Incisions were closed in a two layer fashion with 4 - 0 Vicryl suture. Dermabond was applied to the skin. The port catheter was accessed, blood was aspirated followed by saline and heparin flushes. Needle was removed. A dry sterile dressing was applied.  IMPRESSION: Ultrasound and fluoroscopically guided right internal jugular single lumen power port catheter insertion. Tip in the SVC/RA junction. Catheter ready for use.   Electronically Signed   By: Jerilynn Mages.  Shick M.D.   On: 05/11/2015 15:01   Ir US Guide Vasc Access Right  05/11/2015   CLINICAL DATA:  Ovarian carcinoma, access for chemotherapy  EXAM: RIGHT INTERNAL JUGULAR SINGLE LUMEN POWER PORT CATHETER INSERTION  Date:  8/22/20168/22/2016 2:20 pm  Radiologist:  M. Daryll Brod, MD  Guidance:  Ultrasound fluoroscopic  FLUOROSCOPY TIME:  30 seconds 4 mGy  MEDICATIONS AND MEDICAL HISTORY: 2 g Ancefadministered within 1 hour of the procedure.2 mg Versed, 75 mcg fentanyl  ANESTHESIA/SEDATION: 30 minutes  CONTRAST:  None  COMPLICATIONS: None immediate  PROCEDURE: Informed consent was obtained from the patient following explanation of the procedure, risks, benefits and alternatives. The patient understands, agrees and consents for the procedure. All questions were addressed. A time out was performed.  Maximal barrier sterile technique utilized including caps, mask, sterile gowns, sterile gloves, large sterile drape, hand hygiene, and 2% chlorhexidine scrub.  Under sterile conditions and local anesthesia, right internal jugular micropuncture venous access was performed. Access was performed with ultrasound.  Images were obtained for documentation. A guide wire was inserted followed by a transitional dilator. This allowed insertion of a guide wire and catheter into the IVC. Measurements were obtained from the SVC / RA junction back to the right IJ venotomy site. In the right infraclavicular chest, a subcutaneous pocket was created over the second anterior rib. This was done under sterile conditions and local anesthesia. 1% lidocaine with epinephrine was utilized for this. A 2.5 cm incision was made in the skin. Blunt dissection was performed to create a subcutaneous pocket over the right pectoralis major muscle. The pocket was flushed with saline vigorously. There was adequate hemostasis. The port catheter was assembled and checked for leakage. The port catheter was secured in the pocket with two retention sutures. The tubing was tunneled subcutaneously to the right venotomy site and inserted into the SVC/RA junction through a valved peel-away sheath. Position was confirmed with fluoroscopy. Images were obtained for documentation. The patient tolerated the procedure well. No immediate complications. Incisions were closed in a two layer fashion with 4 - 0 Vicryl suture. Dermabond was applied to the skin. The port catheter was accessed, blood was aspirated followed by saline and heparin flushes. Needle was removed. A dry sterile dressing was applied.  IMPRESSION: Ultrasound and fluoroscopically guided right internal jugular single lumen power port catheter insertion. Tip in  the SVC/RA junction. Catheter ready for use.   Electronically Signed   By: Jerilynn Mages.  Shick M.D.   On: 05/11/2015 15:01    Microbiology: No results found for this or any previous visit (from the past 240 hour(s)).   Labs: Basic Metabolic Panel:  Recent Labs Lab 05/13/15 0920 05/14/15 1053 05/15/15 0632  NA 135 135 137  K 5.1 4.0 4.3  CL 103 106 106  CO2 23 23 25   GLUCOSE 124* 118* 90  BUN 29* 29* 23*  CREATININE 2.74* 1.63* 1.23*  CALCIUM  8.7* 8.2* 8.3*   Liver Function Tests:  Recent Labs Lab 05/13/15 0920  AST 40  ALT 17  ALKPHOS 46  BILITOT 0.5  PROT 7.0  ALBUMIN 3.1*   No results for input(s): LIPASE, AMYLASE in the last 168 hours. No results for input(s): AMMONIA in the last 168 hours. CBC:  Recent Labs Lab 05/11/15 1125 05/13/15 0920 05/14/15 1053 05/15/15 0632  WBC 8.8 12.0* 11.4* 9.9  NEUTROABS  --  10.6*  --   --   HGB 8.0* 7.2* 9.8* 10.1*  HCT 25.5* 22.5* 30.2* 30.7*  MCV 91.4 90.0 87.8 87.7  PLT 534* 496* 426* 422*   Cardiac Enzymes: No results for input(s): CKTOTAL, CKMB, CKMBINDEX, TROPONINI in the last 168 hours. BNP: BNP (last 3 results) No results for input(s): BNP in the last 8760 hours.  ProBNP (last 3 results) No results for input(s): PROBNP in the last 8760 hours.  CBG: No results for input(s): GLUCAP in the last 168 hours.     Signed:  Kathie Dike, M.D. [facility] 05/15/2015, 9:39 AM    I, Rhett Bannister, acting a scribe, recorded this note contemporaneously in the presence of Dr. Kathie Dike, M.D. on 05/15/2015 at 9:40 AM  I have reviewed the above documentation for accuracy and completeness, and I agree with the above.  Shataya Winkles

## 2015-05-19 ENCOUNTER — Encounter (HOSPITAL_BASED_OUTPATIENT_CLINIC_OR_DEPARTMENT_OTHER): Payer: Medicare Other | Admitting: Oncology

## 2015-05-19 ENCOUNTER — Encounter (HOSPITAL_COMMUNITY): Payer: Self-pay | Admitting: Oncology

## 2015-05-19 ENCOUNTER — Encounter (HOSPITAL_BASED_OUTPATIENT_CLINIC_OR_DEPARTMENT_OTHER): Payer: Medicare Other

## 2015-05-19 VITALS — BP 131/79 | HR 96 | Temp 97.7°F | Resp 18 | Wt 156.7 lb

## 2015-05-19 VITALS — BP 134/59 | HR 88 | Temp 97.8°F | Resp 16

## 2015-05-19 DIAGNOSIS — Z79899 Other long term (current) drug therapy: Secondary | ICD-10-CM | POA: Diagnosis not present

## 2015-05-19 DIAGNOSIS — C786 Secondary malignant neoplasm of retroperitoneum and peritoneum: Secondary | ICD-10-CM | POA: Diagnosis not present

## 2015-05-19 DIAGNOSIS — E559 Vitamin D deficiency, unspecified: Secondary | ICD-10-CM | POA: Diagnosis not present

## 2015-05-19 DIAGNOSIS — C569 Malignant neoplasm of unspecified ovary: Secondary | ICD-10-CM

## 2015-05-19 DIAGNOSIS — Z7982 Long term (current) use of aspirin: Secondary | ICD-10-CM | POA: Diagnosis not present

## 2015-05-19 DIAGNOSIS — E782 Mixed hyperlipidemia: Secondary | ICD-10-CM | POA: Diagnosis not present

## 2015-05-19 DIAGNOSIS — C771 Secondary and unspecified malignant neoplasm of intrathoracic lymph nodes: Secondary | ICD-10-CM | POA: Diagnosis not present

## 2015-05-19 DIAGNOSIS — Z5111 Encounter for antineoplastic chemotherapy: Secondary | ICD-10-CM

## 2015-05-19 DIAGNOSIS — E538 Deficiency of other specified B group vitamins: Secondary | ICD-10-CM | POA: Diagnosis not present

## 2015-05-19 DIAGNOSIS — I10 Essential (primary) hypertension: Secondary | ICD-10-CM | POA: Diagnosis not present

## 2015-05-19 LAB — COMPREHENSIVE METABOLIC PANEL
ALK PHOS: 49 U/L (ref 38–126)
ALT: 23 U/L (ref 14–54)
ANION GAP: 8 (ref 5–15)
AST: 38 U/L (ref 15–41)
Albumin: 3.3 g/dL — ABNORMAL LOW (ref 3.5–5.0)
BILIRUBIN TOTAL: 0.5 mg/dL (ref 0.3–1.2)
BUN: 27 mg/dL — ABNORMAL HIGH (ref 6–20)
CALCIUM: 8.8 mg/dL — AB (ref 8.9–10.3)
CO2: 26 mmol/L (ref 22–32)
Chloride: 101 mmol/L (ref 101–111)
Creatinine, Ser: 1.64 mg/dL — ABNORMAL HIGH (ref 0.44–1.00)
GFR calc non Af Amer: 30 mL/min — ABNORMAL LOW (ref >60)
GFR, EST AFRICAN AMERICAN: 35 mL/min — AB (ref >60)
Glucose, Bld: 119 mg/dL — ABNORMAL HIGH (ref 65–99)
Potassium: 4.6 mmol/L (ref 3.5–5.1)
SODIUM: 135 mmol/L (ref 135–145)
TOTAL PROTEIN: 7.2 g/dL (ref 6.5–8.1)

## 2015-05-19 LAB — CBC WITH DIFFERENTIAL/PLATELET
Basophils Absolute: 0 10*3/uL (ref 0.0–0.1)
Basophils Relative: 0 % (ref 0–1)
EOS ABS: 0 10*3/uL (ref 0.0–0.7)
Eosinophils Relative: 0 % (ref 0–5)
HCT: 27.8 % — ABNORMAL LOW (ref 36.0–46.0)
HEMOGLOBIN: 9 g/dL — AB (ref 12.0–15.0)
LYMPHS ABS: 0.8 10*3/uL (ref 0.7–4.0)
Lymphocytes Relative: 5 % — ABNORMAL LOW (ref 12–46)
MCH: 28.8 pg (ref 26.0–34.0)
MCHC: 32.4 g/dL (ref 30.0–36.0)
MCV: 88.8 fL (ref 78.0–100.0)
MONOS PCT: 5 % (ref 3–12)
Monocytes Absolute: 0.7 10*3/uL (ref 0.1–1.0)
NEUTROS PCT: 90 % — AB (ref 43–77)
Neutro Abs: 13.9 10*3/uL — ABNORMAL HIGH (ref 1.7–7.7)
Platelets: 444 10*3/uL — ABNORMAL HIGH (ref 150–400)
RBC: 3.13 MIL/uL — ABNORMAL LOW (ref 3.87–5.11)
RDW: 15.7 % — ABNORMAL HIGH (ref 11.5–15.5)
WBC: 15.4 10*3/uL — ABNORMAL HIGH (ref 4.0–10.5)

## 2015-05-19 MED ORDER — PACLITAXEL CHEMO INJECTION 300 MG/50ML
175.0000 mg/m2 | Freq: Once | INTRAVENOUS | Status: AC
  Administered 2015-05-19: 312 mg via INTRAVENOUS
  Filled 2015-05-19: qty 52

## 2015-05-19 MED ORDER — FAMOTIDINE IN NACL 20-0.9 MG/50ML-% IV SOLN
20.0000 mg | Freq: Once | INTRAVENOUS | Status: AC
  Administered 2015-05-19: 20 mg via INTRAVENOUS
  Filled 2015-05-19: qty 50

## 2015-05-19 MED ORDER — SODIUM CHLORIDE 0.9 % IV SOLN
Freq: Once | INTRAVENOUS | Status: AC
  Administered 2015-05-19: 10:00:00 via INTRAVENOUS

## 2015-05-19 MED ORDER — DEXAMETHASONE SODIUM PHOSPHATE 100 MG/10ML IJ SOLN
Freq: Once | INTRAMUSCULAR | Status: AC
  Administered 2015-05-19: 11:00:00 via INTRAVENOUS
  Filled 2015-05-19: qty 8

## 2015-05-19 MED ORDER — PEGFILGRASTIM 6 MG/0.6ML ~~LOC~~ PSKT
6.0000 mg | PREFILLED_SYRINGE | Freq: Once | SUBCUTANEOUS | Status: AC
  Administered 2015-05-19: 6 mg via SUBCUTANEOUS
  Filled 2015-05-19: qty 0.6

## 2015-05-19 MED ORDER — SODIUM CHLORIDE 0.9 % IJ SOLN: 10.0000 mL | INTRAMUSCULAR | Status: DC | PRN

## 2015-05-19 MED ORDER — DIPHENHYDRAMINE HCL 50 MG/ML IJ SOLN
50.0000 mg | Freq: Once | INTRAMUSCULAR | Status: AC
  Administered 2015-05-19: 50 mg via INTRAVENOUS
  Filled 2015-05-19: qty 1

## 2015-05-19 MED ORDER — SODIUM CHLORIDE 0.9 % IV SOLN
297.5000 mg | Freq: Once | INTRAVENOUS | Status: AC
  Administered 2015-05-19: 300 mg via INTRAVENOUS
  Filled 2015-05-19: qty 30

## 2015-05-19 MED ORDER — HEPARIN SOD (PORK) LOCK FLUSH 100 UNIT/ML IV SOLN
500.0000 [IU] | Freq: Once | INTRAVENOUS | Status: AC | PRN
  Administered 2015-05-19: 500 [IU]
  Filled 2015-05-19 (×2): qty 5

## 2015-05-19 NOTE — Patient Instructions (Signed)
[place] at [facility] Discharge Instructions  RECOMMENDATIONS MADE BY THE CONSULTANT AND ANY TEST RESULTS WILL BE SENT TO YOUR REFERRING PHYSICIAN.  Exam completed by Kirby Crigler.  Chemotherapy as planned today. Neulasta placed today. Return in 7-10 days to see the doctor for follow up after chemotherapy. Return in 3 weeks to see the doctor and to have chemotherapy.  Please call the clinic if you have any questions or concerns.   Thank you for choosing [place] at [facility] to provide your oncology and hematology care.  To afford each patient quality time with our provider, please arrive at least 15 minutes before your scheduled appointment time.    You need to re-schedule your appointment should you arrive 10 or more minutes late.  We strive to give you quality time with our providers, and arriving late affects you and other patients whose appointments are after yours.  Also, if you no show three or more times for appointments you may be dismissed from the clinic at the providers discretion.     Again, thank you for choosing [facility].  Our hope is that these requests will decrease the amount of time that you wait before being seen by our physicians.       _____________________________________________________________  Should you have questions after your visit to [facility], please contact our office at [phone] between the hours of 8:30 a.m. and 4:30 p.m.  Voicemails left after 4:30 p.m. will not be returned until the following business day.  For prescription refill requests, have your pharmacy contact our office.

## 2015-05-19 NOTE — Progress Notes (Signed)
No PCP Per Patient No address on file  Ovarian cancer, unspecified laterality - Plan: CBC with Differential, Comprehensive metabolic panel  CURRENT THERAPY: Carboplatin/Paclitaxel every 21 days beginning on 05/19/2015.  INTERVAL HISTORY: Latasha [name] 74 y.o. 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  Oncology consult completed on 04/27/2015.   04/25/2015 Tumor Marker CA 125- 3902.      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   04/27/2015 Procedure US Paracentesis- A total of approximately 3700 mL of amber colored fluid was removed. A fluid sample was sent for laboratory analysis.   04/27/2015 Imaging CT Chest- Mild left supraclavicular lymphadenopathy and moderate mediastinal lymphadenopathy involving the prevascular, subcarinal and bilateral pericardiophrenic nodal chains, likely metastatic.   04/27/2015 Pathology Results Diagnosis PERITONEAL/ASCITIC FLUID(SPECIMEN 1 OF 1 COLLECTED 04/27/15): MALIGNANT CELLS CONSISTENT WITH METASTATIC ADENOCARCINOMA.  The immunophenotype is most consistent with a gynecologic primary, most likely ovary.   05/08/2015 Miscellaneous Seen by Dr. Denman George- recommending Carboplatin/Paclitaxel x 3 cycles and return visit to see her (within 1 week of administration of third cycle) to evaluate for optimal sequencing of treatment modalities.   05/13/2015 - 05/15/2015 Hospital Admission Hospitalized for AKI   05/19/2015 -  Chemotherapy Carboplatin/Paclitaxel    Chart reviewed.  I personally reviewed and went over laboratory results with the patient.  The results are noted within this dictation.  Pre-chemo  labs are pending at this time.  If labs are appropriate, will treat today as planned.  She was due for treatment last week, but she was noted to have AKI and therefore, she was directly admitted to the Hospital and tx was deferred x 1 week.  Past Medical History  Diagnosis Date  . Hypertension   . Schizophrenia   . Regional enteritis   . Ulcerative colitis   . Mixed hyperlipidemia   . Vitamin D deficiency   . B12 deficiency   . Iron deficiency anemia   . Ovarian cancer     has Renal failure; Nausea without vomiting; Ascites, malignant; Ovarian cancer; Omental mass; Acute kidney injury; UTI (lower urinary tract infection); Normocytic anemia; IBD (inflammatory bowel disease); Schizophrenia, unspecified type; Diarrhea; Malnutrition of moderate degree; and Acute renal failure on her problem list.     has No Known Allergies.  Current Outpatient Prescriptions on File Prior to Visit  Medication Sig Dispense Refill  . amLODipine (NORVASC) 10 MG tablet Take 10 mg by mouth daily.    Marland Kitchen aspirin EC 81 MG tablet Take 81 mg by mouth daily.    Marland Kitchen CARBOPLATIN IV Inject into the vein every 21 ( twenty-one) days. To start 05/13/15    . Cholecalciferol (VITAMIN D-3) 1000 UNITS CAPS Take 1,000 Units by mouth daily.     Marland Kitchen dexamethasone (DECADRON) 4 MG tablet The day before chemo take 5 tablets (25m) in the am and 5 tablets (274m in the pm. The morning of chemo take 5 tablets (2023m 30 tablet 1  . ferrous sulfate 325 (65 FE) MG EC tablet Take 325 mg by mouth 2 (two) times daily.    . folic acid (FOLVITE) 1 MG tablet Take 1 mg by mouth daily.    . lMarland Kitchendocaine-prilocaine (EMLA)  cream Apply a quarter size amount to port site 1 hour prior to chemo. Do not rub in. Cover with plastic wrap. 30 g 3  . lovastatin (MEVACOR) 10 MG tablet Take 10 mg by mouth at bedtime.    . metoprolol tartrate (LOPRESSOR) 25 MG tablet Take 1 tablet (25 mg total) by mouth 2 (two) times daily. 60 tablet 1  . mirtazapine (REMERON) 15 MG  tablet Take 7.5 mg by mouth at bedtime.    . Multiple Vitamin (MULTIVITAMIN WITH MINERALS) TABS tablet Take 1 tablet by mouth daily.    . ondansetron (ZOFRAN ODT) 4 MG disintegrating tablet Take 1 tablet (4 mg total) by mouth every 8 (eight) hours as needed for nausea or vomiting. (Patient taking differently: Take 8 mg by mouth every 8 (eight) hours as needed for nausea or vomiting. ) 30 tablet 0  . ondansetron (ZOFRAN) 8 MG tablet Take 1 tablet (8 mg total) by mouth every 8 (eight) hours as needed for nausea or vomiting. 30 tablet 2  . PACLitaxel (TAXOL IV) Inject into the vein every 21 ( twenty-one) days. To start 05/13/15    . Pegfilgrastim (NEULASTA ONPRO [place]) Inject into the skin every 21 ( twenty-one) days. To be applied to skin at the end of chemo.    . prochlorperazine (COMPAZINE) 10 MG tablet Take 1 tablet (10 mg total) by mouth every 6 (six) hours as needed (Nausea or vomiting). 30 tablet 1  . QUEtiapine (SEROQUEL XR) 300 MG 24 hr tablet Take 300 mg by mouth at bedtime.    . sulfaSALAzine (AZULFIDINE) 500 MG tablet Take 500 mg by mouth 3 (three) times daily.    . vitamin B-12 (CYANOCOBALAMIN) 1000 MCG tablet Take 1,000 mcg by mouth daily.     No current facility-administered medications on file prior to visit.    Past Surgical History  Procedure Laterality Date  . Replacement total knee      right knee in 2003  . Paracentesis  04/27/15    Denies any headaches, dizziness, double vision, fevers, chills, night sweats, nausea, vomiting, diarrhea, constipation, chest pain, heart palpitations, shortness of breath, blood in stool, black tarry stool, urinary pain, urinary burning, urinary frequency, hematuria.   PHYSICAL EXAMINATION  ECOG PERFORMANCE STATUS: 1 - Symptomatic but completely ambulatory  Filed Vitals:   05/19/15 0900  BP: 131/79  Pulse: 96  Temp: 97.7 F (36.5 C)  Resp: 18    GENERAL:alert, no distress, well nourished, well developed, comfortable, cooperative, smiling  and accompanied by her son, Jeneen Rinks. SKIN: skin color, texture, turgor are normal, no rashes or significant lesions HEAD: Normocephalic, No masses, lesions, tenderness or abnormalities EYES: normal, PERRLA, EOMI, Conjunctiva are pink and non-injected EARS: External ears normal OROPHARYNX:lips, buccal mucosa, and tongue normal and mucous membranes are moist  NECK: supple, trachea midline LYMPH:  not examined BREAST:not examined LUNGS: CTA B/L HEART: RRR without murmur, rub or gallop. ABDOMEN:abdomen soft and non-tender BACK: Back symmetric, no curvature. EXTREMITIES:less then 2 second capillary refill, no joint deformities, effusion, or inflammation, no skin discoloration, positive findings:  edema B/L LE edema 1-2+ pitting. NEURO: alert & oriented x 3 with fluent speech, no focal motor/sensory deficits, gait normal   LABORATORY DATA: CBC    Component Value Date/Time   WBC 9.9 05/15/2015 0632   RBC 3.50* 05/15/2015 0632   RBC 2.49* 05/13/2015 0920   HGB 10.1* 05/15/2015 0632   HCT 30.7* 05/15/2015 0632   PLT 422* 05/15/2015 0632   MCV 87.7 05/15/2015 5170  MCH 28.9 05/15/2015 0632   MCHC 32.9 05/15/2015 0632   RDW 16.4* 05/15/2015 0632   LYMPHSABS 0.6* 05/13/2015 0920   MONOABS 0.9 05/13/2015 0920   EOSABS 0.0 05/13/2015 0920   BASOSABS 0.0 05/13/2015 0920      Chemistry      Component Value Date/Time   NA 137 05/15/2015 0632   K 4.3 05/15/2015 0632   CL 106 05/15/2015 0632   CO2 25 05/15/2015 0632   BUN 23* 05/15/2015 0632   CREATININE 1.23* 05/15/2015 0632      Component Value Date/Time   CALCIUM 8.3* 05/15/2015 0632   ALKPHOS 46 05/13/2015 0920   AST 40 05/13/2015 0920   ALT 17 05/13/2015 0920   BILITOT 0.5 05/13/2015 0920     Lab Results  Component Value Date   CA125 3902.0* 04/25/2015    Lab Results  Component Value Date   CEA 0.5 04/26/2015    PENDING LABS:   RADIOGRAPHIC STUDIES:  Ct Abdomen Pelvis Wo Contrast  04/25/2015   CLINICAL DATA:   Vomiting and diarrhea for 2 weeks, shortness of breath, dizziness, leg swelling, history hypertension, ulcerative colitis  EXAM: CT ABDOMEN AND PELVIS WITHOUT CONTRAST  TECHNIQUE: Multidetector CT imaging of the abdomen and pelvis was performed following the standard protocol without IV contrast. IV contrast not utilized due to renal dysfunction, creatinine 2.69. Sagittal and coronal MPR images reconstructed from axial data set. Patient drank dilute oral contrast for exam.  COMPARISON:  None  FINDINGS: Minimal RIGHT pleural effusion.  Within limits of a nonenhanced exam, liver, spleen, pancreas, kidneys, and adrenal glands normal. Normal appendix.  Cystocele present with significant inferior descent of urinary bladder in pelvis.  Extensive ascites.  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  These masses measure up to 5.1 cm in greatest dimension.  Uterus is small and atrophic with multiple calcifications.  Numerous pelvic phleboliths.  Stomach and bowel loops displaced by ascites but otherwise unremarkable.  Upper normal sized to borderline enlarged retroperitoneal nodes without definite adenopathy.  No definite free air, hernia, or acute osseous findings.  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  Cystocele.   Electronically Signed   By: Lavonia Dana M.D.   On: 04/25/2015 15:04   Ct Chest Wo Contrast  04/27/2015   CLINICAL DATA:  74 year old female with peritoneal carcinomatosis.  EXAM: CT CHEST WITHOUT CONTRAST  TECHNIQUE: Multidetector CT imaging of the chest was performed following the standard protocol without IV contrast.  COMPARISON:  CT abdomen/pelvis from 04/25/2015.  FINDINGS: Mediastinum/Nodes: Normal heart size. Mild pericardial fluid/thickening. Mildly atherosclerotic  nonaneurysmal thoracic aorta. Normal caliber pulmonary arteries. Low-density cardiac blood pool, indicating anemia. Normal visualized thyroid. Normal esophagus. No axillary lymphadenopathy. Multiple clustered mildly enlarged right pericardial phrenic lymph nodes, largest 1.2 cm short axis (series 2/image 47). Solitary mildly enlarged 1.1 cm left pericardial phrenic node (2/52). Mildly enlarged 1.3 cm prevascular mediastinal node (2/18). Mildly enlarged 1.1 cm subcarinal node (2/29). No gross hilar adenopathy, noting limited sensitivity for the detection of hilar lymphadenopathy on a noncontrast study. There is a suggestion of mild left supraclavicular lymphadenopathy, largest 1.1 cm (2/8).  Lungs/Pleura: Small right and trace left pleural effusions, increased bilaterally. No pneumothorax. Nonspecific 3 mm right vocal cord polyp (3/3). Right middle lobe 3 mm subpleural pulmonary nodule (3/37) and separate 3 mm right middle  lobe pulmonary nodule (3/38). Segmental basilar right lower lobe atelectasis. 3 mm superior segment left lower lobe pulmonary nodule associated with the left major fissure (3/22). Subsegmental left lower lobe atelectasis.  Upper abdomen: Small volume upper abdominal ascites, decreased. Stable abnormal fat stranding throughout the visualized upper peritoneal cavity, with multiple nodular tumor implants, in keeping with peritoneal carcinomatosis, largest 2.8 cm in the left upper quadrant (2/ 60) .  Musculoskeletal: Mild to moderate degenerative changes in the thoracic spine. No suspicious focal osseous lesions.  IMPRESSION: 1. Mild left supraclavicular lymphadenopathy and moderate mediastinal lymphadenopathy involving the prevascular, subcarinal and bilateral pericardiophrenic nodal chains, likely metastatic. 2. Small right and trace left pleural effusions, increased bilaterally, with associated bibasilar atelectasis. Mild pericardial fluid/thickening. 3. Three tiny pulmonary nodules, largest 3 mm,  indeterminate. Recommend follow-up chest CT in 3 months. 4. Decreased small volume upper abdominal ascites. Stable findings of peritoneal carcinomatosis in the visualized upper abdomen. 5. Nonspecific 3 mm right vocal cord polyp. 6. Anemia.   Electronically Signed   By: Ilona Sorrel M.D.   On: 04/27/2015 13:27   US Transvaginal Non-ob  04/27/2015   CLINICAL DATA:  Peritoneal carcinomatosis  EXAM: TRANSABDOMINAL AND TRANSVAGINAL ULTRASOUND OF PELVIS  TECHNIQUE: Both transabdominal and transvaginal ultrasound examinations of the pelvis were performed. Transabdominal technique was performed for global imaging of the pelvis including uterus, ovaries, adnexal regions, and pelvic cul-de-sac. It was necessary to proceed with endovaginal exam following the transabdominal exam to visualize the uterus and adnexa.  COMPARISON:  CT 04/25/2015  FINDINGS: Uterus  Measurements: 7.0 x 2.6 x 4.6 cm. Small uterine calcifications. Fluid in the uterine cavity measuring 2.5 mm in thickness. No definite uterine mass.  Endometrium  Thickness: Small amount of fluid in the uterine cavity. No in and material mass identified. No focal abnormality visualized.  Right ovary  Measurements: . Not identified separate firm adnexal masses.  Left ovary  Measurements: . Not identified separate from adnexal masses.  Other findings  Large amount of free fluid which has internal echoes suggesting blood or possibly tumor or infection. Multiple soft tissue masses throughout the adnexum consistent with peritoneal carcinomatosis.  IMPRESSION: Ascites and multiple peritoneal soft tissue masses compatible with peritoneal tumor. This could be of ovarian etiology but could also be metastatic. Discrete ovaries not identified.  Small amount of free fluid in the uterine cavity. No uterine mass identified.   Electronically Signed   By: Franchot Gallo M.D.   On: 04/27/2015 13:27   US Pelvis Complete  04/27/2015   CLINICAL DATA:  Peritoneal carcinomatosis  EXAM:  TRANSABDOMINAL AND TRANSVAGINAL ULTRASOUND OF PELVIS  TECHNIQUE: Both transabdominal and transvaginal ultrasound examinations of the pelvis were performed. Transabdominal technique was performed for global imaging of the pelvis including uterus, ovaries, adnexal regions, and pelvic cul-de-sac. It was necessary to proceed with endovaginal exam following the transabdominal exam to visualize the uterus and adnexa.  COMPARISON:  CT 04/25/2015  FINDINGS: Uterus  Measurements: 7.0 x 2.6 x 4.6 cm. Small uterine calcifications. Fluid in the uterine cavity measuring 2.5 mm in thickness. No definite uterine mass.  Endometrium  Thickness: Small amount of fluid in the uterine cavity. No in and material mass identified. No focal abnormality visualized.  Right ovary  Measurements: . Not identified separate firm adnexal masses.  Left ovary  Measurements: . Not identified separate from adnexal masses.  Other findings  Large amount of free fluid which has internal echoes suggesting blood or possibly tumor or infection. Multiple soft tissue masses  throughout the adnexum consistent with peritoneal carcinomatosis.  IMPRESSION: Ascites and multiple peritoneal soft tissue masses compatible with peritoneal tumor. This could be of ovarian etiology but could also be metastatic. Discrete ovaries not identified.  Small amount of free fluid in the uterine cavity. No uterine mass identified.   Electronically Signed   By: Franchot Gallo M.D.   On: 04/27/2015 13:27   US Paracentesis  04/27/2015   CLINICAL DATA:  Malignant ascites  EXAM: ULTRASOUND GUIDED  PARACENTESIS  COMPARISON:  CT abdomen pelvis 04/25/2015  PROCEDURE: An ultrasound guided paracentesis was thoroughly discussed with the patient and questions answered. The benefits, risks, alternatives and complications were also discussed. The patient understands and wishes to proceed with the procedure. Written consent was obtained.  Ultrasound was performed to localize and mark an adequate  pocket of fluid in the left lower quadrant of the abdomen. The area was then prepped and draped in the normal sterile fashion. 1% Lidocaine was used for local anesthesia. Under ultrasound guidance a 19 gauge Yueh catheter was introduced. Paracentesis was performed. The catheter was removed and a dressing applied.  COMPLICATIONS: None.  FINDINGS: A total of approximately 3700 mL of amber colored fluid was removed. A fluid sample was sent for laboratory analysis.  IMPRESSION: Successful ultrasound guided paracentesis yielding 3700 mL of ascites.   Electronically Signed   By: Franchot Gallo M.D.   On: 04/27/2015 13:03   Ir Fluoro Guide Cv Line Right  05/11/2015   CLINICAL DATA:  Ovarian carcinoma, access for chemotherapy  EXAM: RIGHT INTERNAL JUGULAR SINGLE LUMEN POWER PORT CATHETER INSERTION  Date:  8/22/20168/22/2016 2:20 pm  Radiologist:  M. Daryll Brod, MD  Guidance:  Ultrasound fluoroscopic  FLUOROSCOPY TIME:  30 seconds 4 mGy  MEDICATIONS AND MEDICAL HISTORY: 2 g Ancefadministered within 1 hour of the procedure.2 mg Versed, 75 mcg fentanyl  ANESTHESIA/SEDATION: 30 minutes  CONTRAST:  None  COMPLICATIONS: None immediate  PROCEDURE: Informed consent was obtained from the patient following explanation of the procedure, risks, benefits and alternatives. The patient understands, agrees and consents for the procedure. All questions were addressed. A time out was performed.  Maximal barrier sterile technique utilized including caps, mask, sterile gowns, sterile gloves, large sterile drape, hand hygiene, and 2% chlorhexidine scrub.  Under sterile conditions and local anesthesia, right internal jugular micropuncture venous access was performed. Access was performed with ultrasound. Images were obtained for documentation. A guide wire was inserted followed by a transitional dilator. This allowed insertion of a guide wire and catheter into the IVC. Measurements were obtained from the SVC / RA junction back to the right  IJ venotomy site. In the right infraclavicular chest, a subcutaneous pocket was created over the second anterior rib. This was done under sterile conditions and local anesthesia. 1% lidocaine with epinephrine was utilized for this. A 2.5 cm incision was made in the skin. Blunt dissection was performed to create a subcutaneous pocket over the right pectoralis major muscle. The pocket was flushed with saline vigorously. There was adequate hemostasis. The port catheter was assembled and checked for leakage. The port catheter was secured in the pocket with two retention sutures. The tubing was tunneled subcutaneously to the right venotomy site and inserted into the SVC/RA junction through a valved peel-away sheath. Position was confirmed with fluoroscopy. Images were obtained for documentation. The patient tolerated the procedure well. No immediate complications. Incisions were closed in a two layer fashion with 4 - 0 Vicryl suture. Dermabond was applied to the  skin. The port catheter was accessed, blood was aspirated followed by saline and heparin flushes. Needle was removed. A dry sterile dressing was applied.  IMPRESSION: Ultrasound and fluoroscopically guided right internal jugular single lumen power port catheter insertion. Tip in the SVC/RA junction. Catheter ready for use.   Electronically Signed   By: Jerilynn Mages.  Shick M.D.   On: 05/11/2015 15:01   Ir US Guide Vasc Access Right  05/11/2015   CLINICAL DATA:  Ovarian carcinoma, access for chemotherapy  EXAM: RIGHT INTERNAL JUGULAR SINGLE LUMEN POWER PORT CATHETER INSERTION  Date:  8/22/20168/22/2016 2:20 pm  Radiologist:  M. Daryll Brod, MD  Guidance:  Ultrasound fluoroscopic  FLUOROSCOPY TIME:  30 seconds 4 mGy  MEDICATIONS AND MEDICAL HISTORY: 2 g Ancefadministered within 1 hour of the procedure.2 mg Versed, 75 mcg fentanyl  ANESTHESIA/SEDATION: 30 minutes  CONTRAST:  None  COMPLICATIONS: None immediate  PROCEDURE: Informed consent was obtained from the patient  following explanation of the procedure, risks, benefits and alternatives. The patient understands, agrees and consents for the procedure. All questions were addressed. A time out was performed.  Maximal barrier sterile technique utilized including caps, mask, sterile gowns, sterile gloves, large sterile drape, hand hygiene, and 2% chlorhexidine scrub.  Under sterile conditions and local anesthesia, right internal jugular micropuncture venous access was performed. Access was performed with ultrasound. Images were obtained for documentation. A guide wire was inserted followed by a transitional dilator. This allowed insertion of a guide wire and catheter into the IVC. Measurements were obtained from the SVC / RA junction back to the right IJ venotomy site. In the right infraclavicular chest, a subcutaneous pocket was created over the second anterior rib. This was done under sterile conditions and local anesthesia. 1% lidocaine with epinephrine was utilized for this. A 2.5 cm incision was made in the skin. Blunt dissection was performed to create a subcutaneous pocket over the right pectoralis major muscle. The pocket was flushed with saline vigorously. There was adequate hemostasis. The port catheter was assembled and checked for leakage. The port catheter was secured in the pocket with two retention sutures. The tubing was tunneled subcutaneously to the right venotomy site and inserted into the SVC/RA junction through a valved peel-away sheath. Position was confirmed with fluoroscopy. Images were obtained for documentation. The patient tolerated the procedure well. No immediate complications. Incisions were closed in a two layer fashion with 4 - 0 Vicryl suture. Dermabond was applied to the skin. The port catheter was accessed, blood was aspirated followed by saline and heparin flushes. Needle was removed. A dry sterile dressing was applied.  IMPRESSION: Ultrasound and fluoroscopically guided right internal jugular  single lumen power port catheter insertion. Tip in the SVC/RA junction. Catheter ready for use.   Electronically Signed   By: Jerilynn Mages.  Shick M.D.   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  Began systemic chemotherapy with Carboplatin/Paclitaxel every 21 days on 05/19/2015.  Consult with Dr. Denman George, Gyn Onc, completed on 05/08/2015.  Recommendation is to complete 3 cycles of systemic chemotherapy followed by re-evaluation (within 1 week of administration of cycle 3 of chemotherapy) for proper sequencing of treatment modalities (ie role/timing of debulking surgery).  Cycle 1 today, if treatment parameters are met on lab results today.  She will get her OBI placed today as well if treated.  Pre-chemo labs as planned.  Labs in 7-10 days: CBC diff, CMET.  Return in 7-10 days for nadir check.  Return in 3 weeks for follow-up and cycle 2 of treatment.    THERAPY PLAN:  Continue with treatment as planned. Will administer 3 cycles of chemotherapy followed by re-evaluation with Dr. Denman George, within 1 week of cycle 3 administration, for consideration of debulking surgery.  All questions were answered. The patient knows to call the clinic with any problems, questions or concerns. We can certainly see the patient much sooner if necessary.  Patient and plan discussed with Dr. Ancil Linsey and she is in agreement with the aforementioned.   This note is electronically signed by: Doy Mince 05/19/2015 9:35 AM

## 2015-05-19 NOTE — Assessment & Plan Note (Addendum)
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  Began systemic chemotherapy with Carboplatin/Paclitaxel every 21 days on 05/19/2015.  Consult with Dr. Denman George, Gyn Onc, completed on 05/08/2015.  Recommendation is to complete 3 cycles of systemic chemotherapy followed by re-evaluation (within 1 week of administration of cycle 3 of chemotherapy) for proper sequencing of treatment modalities (ie role/timing of debulking surgery).  Cycle 1 today, if treatment parameters are met on lab results today.  She will get her OBI placed today as well if treated.  Pre-chemo labs as planned.  Labs in 7-10 days: CBC diff, CMET.  Return in 7-10 days for nadir check.  Return in 3 weeks for follow-up and cycle 2 of treatment.

## 2015-05-19 NOTE — Progress Notes (Signed)
Patient tolerated infusion well.  VSS during and after Taxol and Carbo infusion.  Patient and son understand the instructions on her Neulasta Onpro.

## 2015-05-19 NOTE — Patient Instructions (Signed)
[place] at [facility] Discharge Instructions  RECOMMENDATIONS MADE BY THE CONSULTANT AND ANY TEST RESULTS WILL BE SENT TO YOUR REFERRING PHYSICIAN.  Exam completed by Kirby Crigler.  Chemotherapy as planned today. Neulasta placed today. Return in 7-10 days to see the doctor for follow up after chemotherapy. Return in 3 weeks to see the doctor and to have chemotherapy.  Please call the clinic if you have any questions or concerns.   Thank you for choosing [place] at [facility] to provide your oncology and hematology care.  To afford each patient quality time with our provider, please arrive at least 15 minutes before your scheduled appointment time.    You need to re-schedule your appointment should you arrive 10 or more minutes late.  We strive to give you quality time with our providers, and arriving late affects you and other patients whose appointments are after yours.  Also, if you no show three or more times for appointments you may be dismissed from the clinic at the providers discretion.     Again, thank you for choosing [facility].  Our hope is that these requests will decrease the amount of time that you wait before being seen by our physicians.       _____________________________________________________________  Should you have questions after your visit to [facility], please contact our office at [phone] between the hours of 8:30 a.m. and 4:30 p.m.  Voicemails left after 4:30 p.m. will not be returned until the following business day.  For prescription refill requests, have your pharmacy contact our office.

## 2015-05-20 ENCOUNTER — Inpatient Hospital Stay (HOSPITAL_COMMUNITY): Payer: Medicare Other

## 2015-05-20 LAB — CA 125: CA 125: 4586 U/mL — AB (ref 0.0–38.1)

## 2015-05-21 ENCOUNTER — Ambulatory Visit (HOSPITAL_COMMUNITY): Payer: Medicare Other | Admitting: Oncology

## 2015-05-28 ENCOUNTER — Encounter (HOSPITAL_COMMUNITY): Payer: Medicare Other | Attending: Oncology | Admitting: Oncology

## 2015-05-28 ENCOUNTER — Encounter (HOSPITAL_COMMUNITY): Payer: Self-pay | Admitting: Oncology

## 2015-05-28 VITALS — BP 126/59 | HR 87 | Temp 97.8°F | Resp 20 | Wt 144.8 lb

## 2015-05-28 DIAGNOSIS — E559 Vitamin D deficiency, unspecified: Secondary | ICD-10-CM | POA: Insufficient documentation

## 2015-05-28 DIAGNOSIS — I10 Essential (primary) hypertension: Secondary | ICD-10-CM | POA: Diagnosis not present

## 2015-05-28 DIAGNOSIS — C786 Secondary malignant neoplasm of retroperitoneum and peritoneum: Secondary | ICD-10-CM | POA: Diagnosis not present

## 2015-05-28 DIAGNOSIS — E538 Deficiency of other specified B group vitamins: Secondary | ICD-10-CM | POA: Diagnosis not present

## 2015-05-28 DIAGNOSIS — E782 Mixed hyperlipidemia: Secondary | ICD-10-CM | POA: Insufficient documentation

## 2015-05-28 DIAGNOSIS — C771 Secondary and unspecified malignant neoplasm of intrathoracic lymph nodes: Secondary | ICD-10-CM | POA: Diagnosis not present

## 2015-05-28 DIAGNOSIS — Z79899 Other long term (current) drug therapy: Secondary | ICD-10-CM | POA: Diagnosis not present

## 2015-05-28 DIAGNOSIS — Z7982 Long term (current) use of aspirin: Secondary | ICD-10-CM | POA: Insufficient documentation

## 2015-05-28 DIAGNOSIS — C569 Malignant neoplasm of unspecified ovary: Secondary | ICD-10-CM | POA: Insufficient documentation

## 2015-05-28 LAB — COMPREHENSIVE METABOLIC PANEL
ALT: 46 U/L (ref 14–54)
ANION GAP: 8 (ref 5–15)
AST: 50 U/L — AB (ref 15–41)
Albumin: 3.8 g/dL (ref 3.5–5.0)
Alkaline Phosphatase: 112 U/L (ref 38–126)
BUN: 17 mg/dL (ref 6–20)
CHLORIDE: 98 mmol/L — AB (ref 101–111)
CO2: 29 mmol/L (ref 22–32)
Calcium: 8.8 mg/dL — ABNORMAL LOW (ref 8.9–10.3)
Creatinine, Ser: 1.37 mg/dL — ABNORMAL HIGH (ref 0.44–1.00)
GFR, EST AFRICAN AMERICAN: 44 mL/min — AB (ref >60)
GFR, EST NON AFRICAN AMERICAN: 38 mL/min — AB (ref >60)
Glucose, Bld: 82 mg/dL (ref 65–99)
POTASSIUM: 3.9 mmol/L (ref 3.5–5.1)
Sodium: 135 mmol/L (ref 135–145)
Total Bilirubin: 0.1 mg/dL — ABNORMAL LOW (ref 0.3–1.2)
Total Protein: 7.5 g/dL (ref 6.5–8.1)

## 2015-05-28 LAB — CBC WITH DIFFERENTIAL/PLATELET
BASOS ABS: 0 10*3/uL (ref 0.0–0.1)
Basophils Relative: 0 % (ref 0–1)
EOS ABS: 0 10*3/uL (ref 0.0–0.7)
Eosinophils Relative: 0 % (ref 0–5)
HCT: 26.7 % — ABNORMAL LOW (ref 36.0–46.0)
Hemoglobin: 8.5 g/dL — ABNORMAL LOW (ref 12.0–15.0)
LYMPHS PCT: 8 % — AB (ref 12–46)
Lymphs Abs: 2.3 10*3/uL (ref 0.7–4.0)
MCH: 29 pg (ref 26.0–34.0)
MCHC: 31.8 g/dL (ref 30.0–36.0)
MCV: 91.1 fL (ref 78.0–100.0)
Monocytes Absolute: 2 10*3/uL — ABNORMAL HIGH (ref 0.1–1.0)
Monocytes Relative: 7 % (ref 3–12)
NEUTROS ABS: 24.6 10*3/uL — AB (ref 1.7–7.7)
Neutrophils Relative %: 85 % — ABNORMAL HIGH (ref 43–77)
PLATELETS: 369 10*3/uL (ref 150–400)
RBC: 2.93 MIL/uL — ABNORMAL LOW (ref 3.87–5.11)
RDW: 16.3 % — AB (ref 11.5–15.5)
WBC: 28.9 10*3/uL — ABNORMAL HIGH (ref 4.0–10.5)

## 2015-05-28 NOTE — Progress Notes (Signed)
No PCP Per Patient No address on file  Ovarian cancer, unspecified laterality - Plan: CBC with Differential, Comprehensive metabolic panel, CBC with Differential, Comprehensive metabolic panel  CURRENT THERAPY: Carboplatin/Paclitaxel every 21 days beginning on 05/19/2015.  INTERVAL HISTORY: Angel Hobdy 74 y.o. 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  TODAY IS A NADIR CHECK    Ovarian cancer   04/25/2015 - 04/28/2015 Hospital Admission Nausea/diarrhea.  Oncology consult completed on 04/27/2015.   04/25/2015 Tumor Marker CA 125- 3902.      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   04/27/2015 Procedure US Paracentesis- A total of approximately 3700 mL of amber colored fluid was removed. A fluid sample was sent for laboratory analysis.   04/27/2015 Imaging CT Chest- Mild left supraclavicular lymphadenopathy and moderate mediastinal lymphadenopathy involving the prevascular, subcarinal and bilateral pericardiophrenic nodal chains, likely metastatic.   04/27/2015 Pathology Results Diagnosis PERITONEAL/ASCITIC FLUID(SPECIMEN 1 OF 1 COLLECTED 04/27/15): MALIGNANT CELLS CONSISTENT WITH METASTATIC ADENOCARCINOMA.  The immunophenotype is most consistent with a gynecologic primary, most likely ovary.   05/08/2015 Miscellaneous Seen by Dr. Denman George- recommending Carboplatin/Paclitaxel x 3 cycles and return visit to see her (within 1 week of administration of third cycle) to evaluate for optimal sequencing of treatment modalities.   05/13/2015 - 05/15/2015 Hospital Admission Hospitalized for AKI   05/19/2015 -  Chemotherapy Carboplatin/Paclitaxel    I personally reviewed and went over laboratory results with the patient.   The results are noted within this dictation.  She tolerated her first cycle of chemotherapy as expected.  She noted some tiredness but overall, she reports that it went well.    Past Medical History  Diagnosis Date  . Hypertension   . Schizophrenia   . Regional enteritis   . Ulcerative colitis   . Mixed hyperlipidemia   . Vitamin D deficiency   . B12 deficiency   . Iron deficiency anemia   . Ovarian cancer     has Renal failure; Nausea without vomiting; Ascites, malignant; Ovarian cancer; Omental mass; Acute kidney injury; UTI (lower urinary tract infection); Normocytic anemia; IBD (inflammatory bowel disease); Schizophrenia, unspecified type; Diarrhea; Malnutrition of moderate degree; and Acute renal failure on her problem list.     has No Known Allergies.  Current Outpatient Prescriptions on File Prior to Visit  Medication Sig Dispense Refill  . amLODipine (NORVASC) 10 MG tablet Take 10 mg by mouth daily.    Marland Kitchen aspirin EC 81 MG tablet Take 81 mg by mouth daily.    Marland Kitchen CARBOPLATIN IV Inject into the vein every 21 ( twenty-one) days. To start 05/13/15    . Cholecalciferol (VITAMIN D-3) 1000 UNITS CAPS Take 1,000 Units by mouth daily.     Marland Kitchen dexamethasone (DECADRON) 4 MG tablet The day before chemo take 5 tablets (35m) in the am and 5 tablets (279m in the pm. The morning of chemo take 5 tablets (201m 30 tablet 1  . ferrous sulfate 325 (65 FE) MG EC tablet Take 325 mg by mouth 2 (two) times daily.    . folic acid (FOLVITE) 1 MG tablet Take 1 mg by mouth daily.    . lMarland Kitchendocaine-prilocaine (EMLA) cream Apply a quarter size amount to port site 1 hour prior to chemo. Do not  rub in. Cover with plastic wrap. 30 g 3  . lovastatin (MEVACOR) 10 MG tablet Take 10 mg by mouth at bedtime.    . metoprolol tartrate (LOPRESSOR) 25 MG tablet Take 1 tablet (25 mg total) by mouth 2 (two) times daily. 60 tablet 1  . mirtazapine (REMERON) 15 MG tablet Take 7.5 mg by mouth at bedtime.    . Multiple Vitamin  (MULTIVITAMIN WITH MINERALS) TABS tablet Take 1 tablet by mouth daily.    . ondansetron (ZOFRAN) 8 MG tablet Take 1 tablet (8 mg total) by mouth every 8 (eight) hours as needed for nausea or vomiting. 30 tablet 2  . PACLitaxel (TAXOL IV) Inject into the vein every 21 ( twenty-one) days. To start 05/13/15    . Pegfilgrastim (NEULASTA ONPRO [place]) Inject into the skin every 21 ( twenty-one) days. To be applied to skin at the end of chemo.    . prochlorperazine (COMPAZINE) 10 MG tablet Take 1 tablet (10 mg total) by mouth every 6 (six) hours as needed (Nausea or vomiting). 30 tablet 1  . QUEtiapine (SEROQUEL XR) 300 MG 24 hr tablet Take 300 mg by mouth at bedtime.    . sulfaSALAzine (AZULFIDINE) 500 MG tablet Take 500 mg by mouth 3 (three) times daily.    . vitamin B-12 (CYANOCOBALAMIN) 1000 MCG tablet Take 1,000 mcg by mouth daily.    . ondansetron (ZOFRAN ODT) 4 MG disintegrating tablet Take 1 tablet (4 mg total) by mouth every 8 (eight) hours as needed for nausea or vomiting. (Patient taking differently: Take 8 mg by mouth every 8 (eight) hours as needed for nausea or vomiting. ) 30 tablet 0   No current facility-administered medications on file prior to visit.    Past Surgical History  Procedure Laterality Date  . Replacement total knee      right knee in 2003  . Paracentesis  04/27/15    Denies any headaches, dizziness, double vision, fevers, chills, night sweats, nausea, vomiting, diarrhea, constipation, chest pain, heart palpitations, shortness of breath, blood in stool, black tarry stool, urinary pain, urinary burning, urinary frequency, hematuria.   PHYSICAL EXAMINATION  ECOG PERFORMANCE STATUS: 1 - Symptomatic but completely ambulatory  Filed Vitals:   05/28/15 1411  BP: 126/59  Pulse: 87  Temp: 97.8 F (36.6 C)  Resp: 20    GENERAL:alert, no distress, well nourished, well developed, comfortable, cooperative, smiling and accompanied by her son, Jeneen Rinks. SKIN: skin color, texture,  turgor are normal, no rashes or significant lesions HEAD: Normocephalic, No masses, lesions, tenderness or abnormalities EYES: normal, PERRLA, EOMI, Conjunctiva are pink and non-injected EARS: External ears normal OROPHARYNX:lips, buccal mucosa, and tongue normal and mucous membranes are moist  NECK: supple, trachea midline LYMPH:  not examined BREAST:not examined LUNGS: CTA B/L HEART: RRR without murmur, rub or gallop. ABDOMEN:abdomen soft and non-tender BACK: Back symmetric, no curvature. EXTREMITIES:less then 2 second capillary refill, no joint deformities, effusion, or inflammation, no skin discoloration, positive findings:  edema B/L LE edema 1-2+ pitting. NEURO: alert & oriented x 3 with fluent speech, no focal motor/sensory deficits, gait normal   LABORATORY DATA: CBC    Component Value Date/Time   WBC 28.9* 05/28/2015 1615   RBC 2.93* 05/28/2015 1615   RBC 2.49* 05/13/2015 0920   HGB 8.5* 05/28/2015 1615   HCT 26.7* 05/28/2015 1615   PLT 369 05/28/2015 1615   MCV 91.1 05/28/2015 1615   MCH 29.0 05/28/2015 1615   MCHC 31.8 05/28/2015 1615   RDW 16.3*  05/28/2015 1615   LYMPHSABS 2.3 05/28/2015 1615   MONOABS 2.0* 05/28/2015 1615   EOSABS 0.0 05/28/2015 1615   BASOSABS 0.0 05/28/2015 1615      Chemistry      Component Value Date/Time   NA 135 05/28/2015 1615   K 3.9 05/28/2015 1615   CL 98* 05/28/2015 1615   CO2 29 05/28/2015 1615   BUN 17 05/28/2015 1615   CREATININE 1.37* 05/28/2015 1615      Component Value Date/Time   CALCIUM 8.8* 05/28/2015 1615   ALKPHOS 112 05/28/2015 1615   AST 50* 05/28/2015 1615   ALT 46 05/28/2015 1615   BILITOT 0.1* 05/28/2015 1615     Lab Results  Component Value Date   CA125 4586.0* 05/19/2015    Lab Results  Component Value Date   CEA 0.5 04/26/2015    PENDING LABS:   RADIOGRAPHIC STUDIES:  Ir Fluoro Guide Cv Line Right  05/11/2015   CLINICAL DATA:  Ovarian carcinoma, access for chemotherapy  EXAM: RIGHT  INTERNAL JUGULAR SINGLE LUMEN POWER PORT CATHETER INSERTION  Date:  8/22/20168/22/2016 2:20 pm  Radiologist:  M. Daryll Brod, MD  Guidance:  Ultrasound fluoroscopic  FLUOROSCOPY TIME:  30 seconds 4 mGy  MEDICATIONS AND MEDICAL HISTORY: 2 g Ancefadministered within 1 hour of the procedure.2 mg Versed, 75 mcg fentanyl  ANESTHESIA/SEDATION: 30 minutes  CONTRAST:  None  COMPLICATIONS: None immediate  PROCEDURE: Informed consent was obtained from the patient following explanation of the procedure, risks, benefits and alternatives. The patient understands, agrees and consents for the procedure. All questions were addressed. A time out was performed.  Maximal barrier sterile technique utilized including caps, mask, sterile gowns, sterile gloves, large sterile drape, hand hygiene, and 2% chlorhexidine scrub.  Under sterile conditions and local anesthesia, right internal jugular micropuncture venous access was performed. Access was performed with ultrasound. Images were obtained for documentation. A guide wire was inserted followed by a transitional dilator. This allowed insertion of a guide wire and catheter into the IVC. Measurements were obtained from the SVC / RA junction back to the right IJ venotomy site. In the right infraclavicular chest, a subcutaneous pocket was created over the second anterior rib. This was done under sterile conditions and local anesthesia. 1% lidocaine with epinephrine was utilized for this. A 2.5 cm incision was made in the skin. Blunt dissection was performed to create a subcutaneous pocket over the right pectoralis major muscle. The pocket was flushed with saline vigorously. There was adequate hemostasis. The port catheter was assembled and checked for leakage. The port catheter was secured in the pocket with two retention sutures. The tubing was tunneled subcutaneously to the right venotomy site and inserted into the SVC/RA junction through a valved peel-away sheath. Position was confirmed  with fluoroscopy. Images were obtained for documentation. The patient tolerated the procedure well. No immediate complications. Incisions were closed in a two layer fashion with 4 - 0 Vicryl suture. Dermabond was applied to the skin. The port catheter was accessed, blood was aspirated followed by saline and heparin flushes. Needle was removed. A dry sterile dressing was applied.  IMPRESSION: Ultrasound and fluoroscopically guided right internal jugular single lumen power port catheter insertion. Tip in the SVC/RA junction. Catheter ready for use.   Electronically Signed   By: Jerilynn Mages.  Shick M.D.   On: 05/11/2015 15:01   Ir US Guide Vasc Access Right  05/11/2015   CLINICAL DATA:  Ovarian carcinoma, access for chemotherapy  EXAM: RIGHT INTERNAL JUGULAR SINGLE LUMEN  POWER PORT CATHETER INSERTION  Date:  8/22/20168/22/2016 2:20 pm  Radiologist:  M. Daryll Brod, MD  Guidance:  Ultrasound fluoroscopic  FLUOROSCOPY TIME:  30 seconds 4 mGy  MEDICATIONS AND MEDICAL HISTORY: 2 g Ancefadministered within 1 hour of the procedure.2 mg Versed, 75 mcg fentanyl  ANESTHESIA/SEDATION: 30 minutes  CONTRAST:  None  COMPLICATIONS: None immediate  PROCEDURE: Informed consent was obtained from the patient following explanation of the procedure, risks, benefits and alternatives. The patient understands, agrees and consents for the procedure. All questions were addressed. A time out was performed.  Maximal barrier sterile technique utilized including caps, mask, sterile gowns, sterile gloves, large sterile drape, hand hygiene, and 2% chlorhexidine scrub.  Under sterile conditions and local anesthesia, right internal jugular micropuncture venous access was performed. Access was performed with ultrasound. Images were obtained for documentation. A guide wire was inserted followed by a transitional dilator. This allowed insertion of a guide wire and catheter into the IVC. Measurements were obtained from the SVC / RA junction back to the right IJ  venotomy site. In the right infraclavicular chest, a subcutaneous pocket was created over the second anterior rib. This was done under sterile conditions and local anesthesia. 1% lidocaine with epinephrine was utilized for this. A 2.5 cm incision was made in the skin. Blunt dissection was performed to create a subcutaneous pocket over the right pectoralis major muscle. The pocket was flushed with saline vigorously. There was adequate hemostasis. The port catheter was assembled and checked for leakage. The port catheter was secured in the pocket with two retention sutures. The tubing was tunneled subcutaneously to the right venotomy site and inserted into the SVC/RA junction through a valved peel-away sheath. Position was confirmed with fluoroscopy. Images were obtained for documentation. The patient tolerated the procedure well. No immediate complications. Incisions were closed in a two layer fashion with 4 - 0 Vicryl suture. Dermabond was applied to the skin. The port catheter was accessed, blood was aspirated followed by saline and heparin flushes. Needle was removed. A dry sterile dressing was applied.  IMPRESSION: Ultrasound and fluoroscopically guided right internal jugular single lumen power port catheter insertion. Tip in the SVC/RA junction. Catheter ready for use.   Electronically Signed   By: Jerilynn Mages.  Shick M.D.   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  Began systemic chemotherapy with Carboplatin/Paclitaxel every 21 days on 05/19/2015.  Consult with Dr. Denman George, Gyn Onc, completed on 05/08/2015.  Recommendation is to complete 3 cycles of systemic chemotherapy followed by re-evaluation (within 1 week of administration of cycle 3 of chemotherapy) for proper sequencing of treatment modalities (ie role/timing of debulking  surgery).  Nadir check today.  Labs today: CBC diff, CMET  Return as scheduled for follow-up and cycle 2 of therapy.  Nadir labs reviewed.  Leukocytosis is secondary to Neulasta; no signs or symptoms of infection.  Hgb is trending downward.  Will need to monitor and if trend continues, we will offer her Aranesp therapy to coordinate with treatment.  Renal function is great.    THERAPY PLAN:  Continue with treatment as planned. Will administer 3 cycles of chemotherapy followed by re-evaluation with Dr. Denman George, within 1 week of cycle 3 administration, for consideration of debulking surgery.  All questions were answered. The patient knows to call the clinic with any problems,  questions or concerns. We can certainly see the patient much sooner if necessary.  Patient and plan discussed with Dr. Ancil Linsey and she is in agreement with the aforementioned.   This note is electronically signed by: Doy Mince 05/28/2015 5:27 PM

## 2015-05-28 NOTE — Patient Instructions (Signed)
[place] at [facility] Discharge Instructions  RECOMMENDATIONS MADE BY THE CONSULTANT AND ANY TEST RESULTS WILL BE SENT TO YOUR REFERRING PHYSICIAN.  Exam done and seen today by Kirby Crigler PA-C Return as scheduled for chemo and follow up  Thank you for choosing [place] at [facility] to provide your oncology and hematology care.  To afford each patient quality time with our provider, please arrive at least 15 minutes before your scheduled appointment time.    You need to re-schedule your appointment should you arrive 10 or more minutes late.  We strive to give you quality time with our providers, and arriving late affects you and other patients whose appointments are after yours.  Also, if you no show three or more times for appointments you may be dismissed from the clinic at the providers discretion.     Again, thank you for choosing [facility].  Our hope is that these requests will decrease the amount of time that you wait before being seen by our physicians.       _____________________________________________________________  Should you have questions after your visit to [facility], please contact our office at [phone] between the hours of 8:30 a.m. and 4:30 p.m.  Voicemails left after 4:30 p.m. will not be returned until the following business day.  For prescription refill requests, have your pharmacy contact our office.

## 2015-05-28 NOTE — Assessment & Plan Note (Addendum)
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  Began systemic chemotherapy with Carboplatin/Paclitaxel every 21 days on 05/19/2015.  Consult with Dr. Denman George, Gyn Onc, completed on 05/08/2015.  Recommendation is to complete 3 cycles of systemic chemotherapy followed by re-evaluation (within 1 week of administration of cycle 3 of chemotherapy) for proper sequencing of treatment modalities (ie role/timing of debulking surgery).  Nadir check today.  Labs today: CBC diff, CMET  Return as scheduled for follow-up and cycle 2 of therapy.  Nadir labs reviewed.  Leukocytosis is secondary to Neulasta; no signs or symptoms of infection.  Hgb is trending downward.  Will need to monitor and if trend continues, we will offer Latasha [name] Aranesp therapy to coordinate with treatment.  Renal function is great.

## 2015-06-03 ENCOUNTER — Inpatient Hospital Stay (HOSPITAL_COMMUNITY): Payer: Medicare Other

## 2015-06-03 ENCOUNTER — Ambulatory Visit (HOSPITAL_COMMUNITY): Payer: Medicare Other | Admitting: Oncology

## 2015-06-10 ENCOUNTER — Ambulatory Visit (HOSPITAL_COMMUNITY): Payer: Medicare Other | Admitting: Oncology

## 2015-06-10 ENCOUNTER — Encounter (HOSPITAL_BASED_OUTPATIENT_CLINIC_OR_DEPARTMENT_OTHER): Payer: Medicare Other | Admitting: Hematology & Oncology

## 2015-06-10 ENCOUNTER — Encounter (HOSPITAL_COMMUNITY): Payer: Self-pay | Admitting: Hematology & Oncology

## 2015-06-10 ENCOUNTER — Encounter (HOSPITAL_BASED_OUTPATIENT_CLINIC_OR_DEPARTMENT_OTHER): Payer: Medicare Other

## 2015-06-10 VITALS — BP 121/69 | HR 85 | Temp 97.5°F | Resp 16 | Wt 143.8 lb

## 2015-06-10 VITALS — BP 134/61 | HR 89 | Temp 98.1°F | Resp 16

## 2015-06-10 DIAGNOSIS — C569 Malignant neoplasm of unspecified ovary: Secondary | ICD-10-CM | POA: Diagnosis present

## 2015-06-10 DIAGNOSIS — D630 Anemia in neoplastic disease: Secondary | ICD-10-CM

## 2015-06-10 DIAGNOSIS — Z5111 Encounter for antineoplastic chemotherapy: Secondary | ICD-10-CM | POA: Diagnosis not present

## 2015-06-10 DIAGNOSIS — R180 Malignant ascites: Secondary | ICD-10-CM

## 2015-06-10 DIAGNOSIS — N19 Unspecified kidney failure: Secondary | ICD-10-CM

## 2015-06-10 LAB — COMPREHENSIVE METABOLIC PANEL
ALBUMIN: 4 g/dL (ref 3.5–5.0)
ALK PHOS: 73 U/L (ref 38–126)
ALT: 42 U/L (ref 14–54)
ANION GAP: 8 (ref 5–15)
AST: 46 U/L — ABNORMAL HIGH (ref 15–41)
BUN: 18 mg/dL (ref 6–20)
CALCIUM: 9.1 mg/dL (ref 8.9–10.3)
CHLORIDE: 103 mmol/L (ref 101–111)
CO2: 26 mmol/L (ref 22–32)
CREATININE: 1.05 mg/dL — AB (ref 0.44–1.00)
GFR calc non Af Amer: 52 mL/min — ABNORMAL LOW (ref >60)
GLUCOSE: 127 mg/dL — AB (ref 65–99)
Potassium: 4 mmol/L (ref 3.5–5.1)
SODIUM: 137 mmol/L (ref 135–145)
Total Bilirubin: 0.5 mg/dL (ref 0.3–1.2)
Total Protein: 7.8 g/dL (ref 6.5–8.1)

## 2015-06-10 LAB — CBC WITH DIFFERENTIAL/PLATELET
BASOS PCT: 0 %
Basophils Absolute: 0 10*3/uL (ref 0.0–0.1)
EOS ABS: 0 10*3/uL (ref 0.0–0.7)
EOS PCT: 0 %
HCT: 23.8 % — ABNORMAL LOW (ref 36.0–46.0)
HEMOGLOBIN: 7.6 g/dL — AB (ref 12.0–15.0)
Lymphocytes Relative: 5 %
Lymphs Abs: 1.1 10*3/uL (ref 0.7–4.0)
MCH: 28.8 pg (ref 26.0–34.0)
MCHC: 31.9 g/dL (ref 30.0–36.0)
MCV: 90.2 fL (ref 78.0–100.0)
Monocytes Absolute: 0.8 10*3/uL (ref 0.1–1.0)
Monocytes Relative: 4 %
NEUTROS PCT: 91 %
Neutro Abs: 19.1 10*3/uL — ABNORMAL HIGH (ref 1.7–7.7)
PLATELETS: 358 10*3/uL (ref 150–400)
RBC: 2.64 MIL/uL — AB (ref 3.87–5.11)
RDW: 17.9 % — ABNORMAL HIGH (ref 11.5–15.5)
WBC: 21 10*3/uL — AB (ref 4.0–10.5)

## 2015-06-10 LAB — URINALYSIS, ROUTINE W REFLEX MICROSCOPIC
Bilirubin Urine: NEGATIVE
GLUCOSE, UA: NEGATIVE mg/dL
Hgb urine dipstick: NEGATIVE
Ketones, ur: NEGATIVE mg/dL
LEUKOCYTES UA: NEGATIVE
Nitrite: NEGATIVE
PH: 5.5 (ref 5.0–8.0)
PROTEIN: NEGATIVE mg/dL
SPECIFIC GRAVITY, URINE: 1.015 (ref 1.005–1.030)
Urobilinogen, UA: 0.2 mg/dL (ref 0.0–1.0)

## 2015-06-10 LAB — PREPARE RBC (CROSSMATCH)

## 2015-06-10 LAB — IRON AND TIBC
IRON: 154 ug/dL (ref 28–170)
SATURATION RATIOS: 65 % — AB (ref 10.4–31.8)
TIBC: 235 ug/dL — AB (ref 250–450)
UIBC: 81 ug/dL

## 2015-06-10 LAB — RETICULOCYTES
RBC.: 2.55 MIL/uL — AB (ref 3.87–5.11)
RETIC CT PCT: 3.7 % — AB (ref 0.4–3.1)
Retic Count, Absolute: 94.4 10*3/uL (ref 19.0–186.0)

## 2015-06-10 LAB — FOLATE: Folate: 46.8 ng/mL (ref >5.9)

## 2015-06-10 LAB — FERRITIN: FERRITIN: 2449 ng/mL — AB (ref 11–307)

## 2015-06-10 LAB — VITAMIN B12: Vitamin B-12: 5077 pg/mL — ABNORMAL HIGH (ref 180–914)

## 2015-06-10 MED ORDER — PEGFILGRASTIM 6 MG/0.6ML ~~LOC~~ PSKT
6.0000 mg | PREFILLED_SYRINGE | Freq: Once | SUBCUTANEOUS | Status: AC
  Administered 2015-06-10: 6 mg via SUBCUTANEOUS
  Filled 2015-06-10: qty 0.6

## 2015-06-10 MED ORDER — FAMOTIDINE IN NACL 20-0.9 MG/50ML-% IV SOLN
20.0000 mg | Freq: Once | INTRAVENOUS | Status: AC
  Administered 2015-06-10: 20 mg via INTRAVENOUS
  Filled 2015-06-10: qty 50

## 2015-06-10 MED ORDER — PACLITAXEL CHEMO INJECTION 300 MG/50ML
175.0000 mg/m2 | Freq: Once | INTRAVENOUS | Status: AC
  Administered 2015-06-10: 312 mg via INTRAVENOUS
  Filled 2015-06-10: qty 52

## 2015-06-10 MED ORDER — SODIUM CHLORIDE 0.9 % IV SOLN
Freq: Once | INTRAVENOUS | Status: AC
  Administered 2015-06-10: 11:00:00 via INTRAVENOUS
  Filled 2015-06-10: qty 8

## 2015-06-10 MED ORDER — HEPARIN SOD (PORK) LOCK FLUSH 100 UNIT/ML IV SOLN
500.0000 [IU] | Freq: Once | INTRAVENOUS | Status: AC | PRN
  Administered 2015-06-10: 500 [IU]
  Filled 2015-06-10: qty 5

## 2015-06-10 MED ORDER — CARBOPLATIN CHEMO INJECTION 450 MG/45ML
394.0000 mg | Freq: Once | INTRAVENOUS | Status: AC
  Administered 2015-06-10: 390 mg via INTRAVENOUS
  Filled 2015-06-10: qty 39

## 2015-06-10 MED ORDER — SODIUM CHLORIDE 0.9 % IV SOLN
Freq: Once | INTRAVENOUS | Status: AC
  Administered 2015-06-10: 10:00:00 via INTRAVENOUS

## 2015-06-10 MED ORDER — DIPHENHYDRAMINE HCL 50 MG/ML IJ SOLN
50.0000 mg | Freq: Once | INTRAMUSCULAR | Status: AC
Start: 2015-06-10 — End: 2015-06-10
  Administered 2015-06-10: 50 mg via INTRAVENOUS
  Filled 2015-06-10: qty 1

## 2015-06-10 MED ORDER — SODIUM CHLORIDE 0.9 % IJ SOLN
10.0000 mL | INTRAMUSCULAR | Status: DC | PRN
  Administered 2015-06-10: 10 mL
  Filled 2015-06-10: qty 10

## 2015-06-10 NOTE — Patient Instructions (Signed)
[facility] Discharge Instructions for Patients Receiving Chemotherapy  Today you received the following chemotherapy agents taxol and carbo Neulasta OBI placed today Return tomorrow for blood transfusion Lab re-check next week Return as scheduled Chemotherapy in [redacted] weeks along with doctors appt Please call the clinic if you have any questions or concerns    To help prevent nausea and vomiting after your treatment, we encourage you to take your nausea medication    If you develop nausea and vomiting, or diarrhea that is not controlled by your medication, call the clinic.  The clinic phone number is [phone]. Office hours are Monday-Friday 8:30am-5:00pm.  BELOW ARE SYMPTOMS THAT SHOULD BE REPORTED IMMEDIATELY:  *FEVER GREATER THAN 101.0 F  *CHILLS WITH OR WITHOUT FEVER  NAUSEA AND VOMITING THAT IS NOT CONTROLLED WITH YOUR NAUSEA MEDICATION  *UNUSUAL SHORTNESS OF BREATH  *UNUSUAL BRUISING OR BLEEDING  TENDERNESS IN MOUTH AND THROAT WITH OR WITHOUT PRESENCE OF ULCERS  *URINARY PROBLEMS  *BOWEL PROBLEMS  UNUSUAL RASH Items with * indicate a potential emergency and should be followed up as soon as possible. If you have an emergency after office hours please contact your primary care physician or go to the nearest emergency department.  Please call the clinic during office hours if you have any questions or concerns.   You may also contact the Patient Navigator at [phone] should you have any questions or need assistance in obtaining follow up care. _____________________________________________________________________ Have you asked about our STAR program?    STAR stands for Survivorship Training and Rehabilitation, and this is a nationally recognized cancer care program that focuses on survivorship and rehabilitation.  Cancer and cancer treatments may cause problems, such as, pain, making you feel tired and keeping you from doing the things that you  need or want to do. Cancer rehabilitation can help. Our goal is to reduce these troubling effects and help you have the best quality of life possible.  You may receive a survey from a nurse that asks questions about your current state of health.  Based on the survey results, all eligible patients will be referred to the [facility] program for an evaluation so we can better serve you! A frequently asked questions sheet is available upon request.

## 2015-06-10 NOTE — Progress Notes (Signed)
No PCP Per Patient No address on file  Ovarian cancer, unspecified laterality - Plan: Reticulocytes, Ferritin, Iron and TIBC, Vitamin B12, Folate, Urinalysis, Routine w reflex microscopic  Renal failure - Plan: Reticulocytes, Ferritin, Iron and TIBC, Vitamin B12, Folate, Urinalysis, Routine w reflex microscopic  Ascites, malignant - Plan: Reticulocytes, Ferritin, Iron and TIBC, Vitamin B12, Folate, Urinalysis, Routine w reflex microscopic  Anemia in neoplastic disease - Plan: Practitioner attestation of consent, Complete patient signature process for consent form, Care order/instruction, 0.9 %  sodium chloride infusion, sodium chloride 0.9 % injection 10 mL, heparin lock flush 100 unit/mL, heparin lock flush 100 unit/mL, sodium chloride 0.9 % injection 3 mL, Type and screen, Transfuse RBC, CBC with Differential, CANCELED: Prepare RBC  CURRENT THERAPY: Carboplatin/Paclitaxel every 21 days beginning on 05/19/2015.  INTERVAL HISTORY: Latasha [name] 74 y.o. 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    Ovarian cancer   04/25/2015 - 04/28/2015 Hospital Admission Nausea/diarrhea.  Oncology consult completed on 04/27/2015.   04/25/2015 Tumor Marker CA 125- 3902.      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   04/27/2015 Procedure US Paracentesis- A total of approximately 3700 mL of amber colored fluid was removed. A fluid sample was sent for laboratory analysis.   04/27/2015 Imaging CT Chest- Mild left supraclavicular lymphadenopathy and moderate mediastinal lymphadenopathy involving the prevascular, subcarinal and bilateral pericardiophrenic nodal chains, likely metastatic.   04/27/2015 Pathology Results  Diagnosis PERITONEAL/ASCITIC FLUID(SPECIMEN 1 OF 1 COLLECTED 04/27/15): MALIGNANT CELLS CONSISTENT WITH METASTATIC ADENOCARCINOMA.  The immunophenotype is most consistent with a gynecologic primary, most likely ovary.   05/08/2015 Miscellaneous Seen by Dr. Denman George- recommending Carboplatin/Paclitaxel x 3 cycles and return visit to see her (within 1 week of administration of third cycle) to evaluate for optimal sequencing of treatment modalities.   05/13/2015 - 05/15/2015 Hospital Admission Hospitalized for AKI   05/19/2015 -  Chemotherapy Carboplatin/Paclitaxel    Latasha [name] is here today with her son. She is in an excellent, bright mood. When asked about her health, her son confirms hat he feels she did very well through her chemotherapy treatment.  Her weight is stable. She reports that her belly is a "whole lot better." She states she is experiencing some nausea, but "not a whole lot."  With regards to diarrhea, taking immodium stopped the diarrhea she experienced. She was advised to call if immodium ever stops working. She denies any diarrhea today. She says before the immodium, every time she ate anything, she'd have to go to the bathroom; but this issue is well under control with the use of immodium, she also notes it only occurred for several days after chemotherapy. She denies any blood in her stool.  She says she is not experiencing numbness or tingling in her fingertips or extremities. She denies sores in her mouth.   As far as her daily activities, she's been active. She says yesterday she was raking grass; she's been washing her son's clothes; doing things when she feels up to it. She says "everybody tells me to keep it up, don't get depressed."  She saw the gynecologic oncologist at [facility].    Past Medical History  Diagnosis Date  . Hypertension   . Schizophrenia   . Regional  enteritis   . Ulcerative colitis   . Mixed hyperlipidemia   . Vitamin D deficiency   . B12  deficiency   . Iron deficiency anemia   . Ovarian cancer     has Renal failure; Nausea without vomiting; Ascites, malignant; Ovarian cancer; Omental mass; Acute kidney injury; UTI (lower urinary tract infection); Normocytic anemia; IBD (inflammatory bowel disease); Schizophrenia, unspecified type; Diarrhea; Malnutrition of moderate degree; and Acute renal failure on her problem list.     has No Known Allergies.  Current Outpatient Prescriptions on File Prior to Visit  Medication Sig Dispense Refill  . amLODipine (NORVASC) 10 MG tablet Take 10 mg by mouth daily.    Marland Kitchen aspirin EC 81 MG tablet Take 81 mg by mouth daily.    Marland Kitchen CARBOPLATIN IV Inject into the vein every 21 ( twenty-one) days. To start 05/13/15    . Cholecalciferol (VITAMIN D-3) 1000 UNITS CAPS Take 1,000 Units by mouth daily.     Marland Kitchen dexamethasone (DECADRON) 4 MG tablet The day before chemo take 5 tablets (27m) in the am and 5 tablets (220m in the pm. The morning of chemo take 5 tablets (2039m 30 tablet 1  . ferrous sulfate 325 (65 FE) MG EC tablet Take 325 mg by mouth 2 (two) times daily.    . folic acid (FOLVITE) 1 MG tablet Take 1 mg by mouth daily.    . lMarland Kitchendocaine-prilocaine (EMLA) cream Apply a quarter size amount to port site 1 hour prior to chemo. Do not rub in. Cover with plastic wrap. 30 g 3  . lovastatin (MEVACOR) 10 MG tablet Take 10 mg by mouth at bedtime.    . metoprolol tartrate (LOPRESSOR) 25 MG tablet Take 1 tablet (25 mg total) by mouth 2 (two) times daily. 60 tablet 1  . mirtazapine (REMERON) 15 MG tablet Take 7.5 mg by mouth at bedtime.    . Multiple Vitamin (MULTIVITAMIN WITH MINERALS) TABS tablet Take 1 tablet by mouth daily.    . ondansetron (ZOFRAN) 8 MG tablet Take 1 tablet (8 mg total) by mouth every 8 (eight) hours as needed for nausea or vomiting. 30 tablet 2  . Pegfilgrastim (NEULASTA ONPRO Mathews) Inject into the skin every 21 ( twenty-one) days. To be applied to skin at the end of chemo.    .  prochlorperazine (COMPAZINE) 10 MG tablet Take 1 tablet (10 mg total) by mouth every 6 (six) hours as needed (Nausea or vomiting). 30 tablet 1  . QUEtiapine (SEROQUEL XR) 300 MG 24 hr tablet Take 300 mg by mouth at bedtime.    . sulfaSALAzine (AZULFIDINE) 500 MG tablet Take 500 mg by mouth 3 (three) times daily.    . vitamin B-12 (CYANOCOBALAMIN) 1000 MCG tablet Take 1,000 mcg by mouth daily.    . PMarland KitchenCLitaxel (TAXOL IV) Inject into the vein every 21 ( twenty-one) days. To start 05/13/15     Current Facility-Administered Medications on File Prior to Visit  Medication Dose Route Frequency Provider Last Rate Last Dose  . CARBOplatin (PARAPLATIN) 390 mg in sodium chloride 0.9 % 100 mL chemo infusion  390 mg Intravenous Once ShaPatrici RanksD      . heparin lock flush 100 unit/mL  500 Units Intracatheter Once PRN ShaPatrici RanksD      . PACLitaxel (TAXOL) 312 mg in dextrose 5 % 500 mL chemo infusion (> 76m76m)  175 mg/m2 (Treatment Plan Actual) Intravenous Once ShanPatrici Ranks 184 mL/hr at 06/10/15 1139 312 mg  at 06/10/15 1139  . pegfilgrastim (NEULASTA ONPRO KIT) injection 6 mg  6 mg Subcutaneous Once Patrici Ranks, MD      . sodium chloride 0.9 % injection 10 mL  10 mL Intracatheter PRN Patrici Ranks, MD   10 mL at 06/10/15 1028    Past Surgical History  Procedure Laterality Date  . Replacement total knee      right knee in 2003  . Paracentesis  04/27/15  . Portacath placement Right 04/2015    Denies any headaches, dizziness, double vision, fevers, chills, night sweats, nausea, vomiting, diarrhea, constipation, chest pain, heart palpitations, shortness of breath, blood in stool, black tarry stool, urinary pain, urinary burning, urinary frequency, hematuria.  14 point review of systems was performed and is negative except as detailed under history of present illness and above    PHYSICAL EXAMINATION  ECOG PERFORMANCE STATUS: 1 - Symptomatic but completely  ambulatory  Filed Vitals:   06/10/15 0826  BP: 121/69  Pulse: 85  Temp: 97.5 F (36.4 C)  Resp: 16    GENERAL:alert, no distress, well nourished, well developed, comfortable, cooperative, smiling and accompanied by her son, Jeneen Rinks. SKIN: skin color, texture, turgor are normal, no rashes or significant lesions HEAD: Normocephalic, No masses, lesions, tenderness or abnormalities EYES: normal, PERRLA, EOMI, Conjunctiva are pink and non-injected EARS: External ears normal OROPHARYNX:lips, buccal mucosa, and tongue normal and mucous membranes are moist  NECK: supple, trachea midline LYMPH:  NO palpable adenopathy in the neck, axillae  BREAST:not examined LUNGS: CTA B/L HEART: RRR without murmur, rub or gallop. ABDOMEN:abdomen soft and non-tender, no rebound or guarding BACK: Back symmetric, no curvature. EXTREMITIES:less then 2 second capillary refill, no joint deformities, effusion, or inflammation, no skin discoloration, positive findings:   Trace pitting edema at the ankles NEURO: alert & oriented x 3 with fluent speech, no focal motor/sensory deficits, gait normal   LABORATORY DATA: I have reviewed the labs below.  CBC    Component Value Date/Time   WBC 21.0* 06/10/2015 0845   RBC 2.55* 06/10/2015 1015   RBC 2.64* 06/10/2015 0845   HGB 7.6* 06/10/2015 0845   HCT 23.8* 06/10/2015 0845   PLT 358 06/10/2015 0845   MCV 90.2 06/10/2015 0845   MCH 28.8 06/10/2015 0845   MCHC 31.9 06/10/2015 0845   RDW 17.9* 06/10/2015 0845   LYMPHSABS 1.1 06/10/2015 0845   MONOABS 0.8 06/10/2015 0845   EOSABS 0.0 06/10/2015 0845   BASOSABS 0.0 06/10/2015 0845      Chemistry      Component Value Date/Time   NA 137 06/10/2015 0845   K 4.0 06/10/2015 0845   CL 103 06/10/2015 0845   CO2 26 06/10/2015 0845   BUN 18 06/10/2015 0845   CREATININE 1.05* 06/10/2015 0845      Component Value Date/Time   CALCIUM 9.1 06/10/2015 0845   ALKPHOS 73 06/10/2015 0845   AST 46* 06/10/2015 0845    ALT 42 06/10/2015 0845   BILITOT 0.5 06/10/2015 0845     Lab Results  Component Value Date   CA125 4586.0* 05/19/2015    Lab Results  Component Value Date   CEA 0.5 04/26/2015     RADIOGRAPHIC STUDIES:  Ir Fluoro Guide Cv Line Right  05/11/2015   CLINICAL DATA:  Ovarian carcinoma, access for chemotherapy  EXAM: RIGHT INTERNAL JUGULAR SINGLE LUMEN POWER PORT CATHETER INSERTION  Date:  8/22/20168/22/2016 2:20 pm  Radiologist:  M. Daryll Brod, MD  Guidance:  Ultrasound fluoroscopic  FLUOROSCOPY  TIME:  30 seconds 4 mGy  MEDICATIONS AND MEDICAL HISTORY: 2 g Ancefadministered within 1 hour of the procedure.2 mg Versed, 75 mcg fentanyl  ANESTHESIA/SEDATION: 30 minutes  CONTRAST:  None  COMPLICATIONS: None immediate  PROCEDURE: Informed consent was obtained from the patient following explanation of the procedure, risks, benefits and alternatives. The patient understands, agrees and consents for the procedure. All questions were addressed. A time out was performed.  Maximal barrier sterile technique utilized including caps, mask, sterile gowns, sterile gloves, large sterile drape, hand hygiene, and 2% chlorhexidine scrub.  Under sterile conditions and local anesthesia, right internal jugular micropuncture venous access was performed. Access was performed with ultrasound. Images were obtained for documentation. A guide wire was inserted followed by a transitional dilator. This allowed insertion of a guide wire and catheter into the IVC. Measurements were obtained from the SVC / RA junction back to the right IJ venotomy site. In the right infraclavicular chest, a subcutaneous pocket was created over the second anterior rib. This was done under sterile conditions and local anesthesia. 1% lidocaine with epinephrine was utilized for this. A 2.5 cm incision was made in the skin. Blunt dissection was performed to create a subcutaneous pocket over the right pectoralis major muscle. The pocket was flushed with  saline vigorously. There was adequate hemostasis. The port catheter was assembled and checked for leakage. The port catheter was secured in the pocket with two retention sutures. The tubing was tunneled subcutaneously to the right venotomy site and inserted into the SVC/RA junction through a valved peel-away sheath. Position was confirmed with fluoroscopy. Images were obtained for documentation. The patient tolerated the procedure well. No immediate complications. Incisions were closed in a two layer fashion with 4 - 0 Vicryl suture. Dermabond was applied to the skin. The port catheter was accessed, blood was aspirated followed by saline and heparin flushes. Needle was removed. A dry sterile dressing was applied.  IMPRESSION: Ultrasound and fluoroscopically guided right internal jugular single lumen power port catheter insertion. Tip in the SVC/RA junction. Catheter ready for use.   Electronically Signed   By: Jerilynn Mages.  Shick M.D.   On: 05/11/2015 15:01   Ir US Guide Vasc Access Right  05/11/2015   CLINICAL DATA:  Ovarian carcinoma, access for chemotherapy  EXAM: RIGHT INTERNAL JUGULAR SINGLE LUMEN POWER PORT CATHETER INSERTION  Date:  8/22/20168/22/2016 2:20 pm  Radiologist:  M. Daryll Brod, MD  Guidance:  Ultrasound fluoroscopic  FLUOROSCOPY TIME:  30 seconds 4 mGy  MEDICATIONS AND MEDICAL HISTORY: 2 g Ancefadministered within 1 hour of the procedure.2 mg Versed, 75 mcg fentanyl  ANESTHESIA/SEDATION: 30 minutes  CONTRAST:  None  COMPLICATIONS: None immediate  PROCEDURE: Informed consent was obtained from the patient following explanation of the procedure, risks, benefits and alternatives. The patient understands, agrees and consents for the procedure. All questions were addressed. A time out was performed.  Maximal barrier sterile technique utilized including caps, mask, sterile gowns, sterile gloves, large sterile drape, hand hygiene, and 2% chlorhexidine scrub.  Under sterile conditions and local anesthesia, right  internal jugular micropuncture venous access was performed. Access was performed with ultrasound. Images were obtained for documentation. A guide wire was inserted followed by a transitional dilator. This allowed insertion of a guide wire and catheter into the IVC. Measurements were obtained from the SVC / RA junction back to the right IJ venotomy site. In the right infraclavicular chest, a subcutaneous pocket was created over the second anterior rib. This was done under sterile  conditions and local anesthesia. 1% lidocaine with epinephrine was utilized for this. A 2.5 cm incision was made in the skin. Blunt dissection was performed to create a subcutaneous pocket over the right pectoralis major muscle. The pocket was flushed with saline vigorously. There was adequate hemostasis. The port catheter was assembled and checked for leakage. The port catheter was secured in the pocket with two retention sutures. The tubing was tunneled subcutaneously to the right venotomy site and inserted into the SVC/RA junction through a valved peel-away sheath. Position was confirmed with fluoroscopy. Images were obtained for documentation. The patient tolerated the procedure well. No immediate complications. Incisions were closed in a two layer fashion with 4 - 0 Vicryl suture. Dermabond was applied to the skin. The port catheter was accessed, blood was aspirated followed by saline and heparin flushes. Needle was removed. A dry sterile dressing was applied.  IMPRESSION: Ultrasound and fluoroscopically guided right internal jugular single lumen power port catheter insertion. Tip in the SVC/RA junction. Catheter ready for use.   Electronically Signed   By: Jerilynn Mages.  Shick M.D.   On: 05/11/2015 15:01       ASSESSMENT AND PLAN:  Stage IVB Ovarian Carcinoma Malignant Ascites Anemia Leukocytosis  She has done very well with chemotherapy. Her clinical exam is markedly improved. Her anemia is significant and problematic. I have added  additional laboratory studies today including iron studies, R15 and folic acid. I have recommended another transfusion. We may need to consider the addition of stool cards if this remains persistent in spite of improvement in her overall malignancy and if the above studies come back negative. We will check another CBC next week.  She has a leukocytosis, present since the end of August. She is asymptomatic. We will check a UA today. Question if tumor related.  Continue with treatment as planned, carboplatin/taxol.  Orders Placed This Encounter  Procedures  . Reticulocytes    Standing Status: Future     Number of Occurrences: 1     Standing Expiration Date: 06/09/2016  . Ferritin    Standing Status: Future     Number of Occurrences: 1     Standing Expiration Date: 06/09/2016  . Iron and TIBC    Standing Status: Future     Number of Occurrences: 1     Standing Expiration Date: 06/09/2016  . Vitamin B12    Standing Status: Future     Number of Occurrences: 1     Standing Expiration Date: 06/09/2016  . Folate    Standing Status: Future     Number of Occurrences: 1     Standing Expiration Date: 06/09/2016  . Urinalysis, Routine w reflex microscopic    Standing Status: Future     Number of Occurrences: 1     Standing Expiration Date: 06/09/2016  . CBC with Differential    Standing Status: Future     Number of Occurrences:      Standing Expiration Date: 06/09/2016  . Practitioner attestation of consent    I, the ordering practitioner, attest that I have discussed with the patient the benefits, risks, side effects, alternatives, likelihood of achieving goals and potential problems during recovery for the procedure listed.    Standing Status: Future     Number of Occurrences: 1     Standing Expiration Date: 06/09/2016    Order Specific Question:  Procedure    Answer:  Blood Product(s)  . Complete patient signature process for consent form    Standing Status: Future  Number of  Occurrences: 1     Standing Expiration Date: 06/09/2016  . Care order/instruction    Transfuse Parameters    Standing Status: Future     Number of Occurrences: 1     Standing Expiration Date: 06/09/2016  . Type and screen    Standing Status: Future     Number of Occurrences: 1     Standing Expiration Date: 06/09/2016    All questions were answered. The patient knows to call the clinic with any problems, questions or concerns. We can certainly see the patient much sooner if necessary.  This document serves as a record of services personally performed by Ancil Linsey, MD. It was created on her behalf by Toni Amend, a trained medical scribe. The creation of this record is based on the scribe's personal observations and the provider's statements to them. This document has been checked and approved by the attending provider.  I have reviewed the above documentation for accuracy and completeness, and I agree with the above.  This note is electronically signed by: Molli Hazard, MD  12:10 PM

## 2015-06-10 NOTE — Patient Instructions (Addendum)
[place] at [facility] Discharge Instructions  RECOMMENDATIONS MADE BY THE CONSULTANT AND ANY TEST RESULTS WILL BE SENT TO YOUR REFERRING PHYSICIAN.  Exam and discussion by Dr. Whitney Muse. Will do an anemia work-up and will also get you scheduled for a blood transfusion.  Your hemoglobin has dropped and a blood transfusion may make you feel better. Chemotherapy as scheduled Want to check your labs next week to see how you are doing. Call with uncontrolled nausea, vomiting or other concerns.  Office visit and chemotherapy in 3 weeks.   Thank you for choosing [facility] at [facility] to provide your oncology and hematology care.  To afford each patient quality time with our provider, please arrive at least 15 minutes before your scheduled appointment time.    You need to re-schedule your appointment should you arrive 10 or more minutes late.  We strive to give you quality time with our providers, and arriving late affects you and other patients whose appointments are after yours.  Also, if you no show three or more times for appointments you may be dismissed from the clinic at the providers discretion.     Again, thank you for choosing [facility].  Our hope is that these requests will decrease the amount of time that you wait before being seen by our physicians.       _____________________________________________________________  Should you have questions after your visit to [facility], please contact our office at [phone] between the hours of 8:30 a.m. and 4:30 p.m.  Voicemails left after 4:30 p.m. will not be returned until the following business day.  For prescription refill requests, have your pharmacy contact our office.

## 2015-06-10 NOTE — Progress Notes (Signed)
Ok to transfuse with hemoglobin 7.6 will transfuse tomorrow   Latasha [name] Tolerated chemotherapy well today Discharged ambulatory  .Marland KitchenIola Cunninghampresents today for neulasta OBI placement per MD orders. OBI device filled per protocol and placed on left Upper Arm. Needle/catheter placement noted prior to patient leaving. Tolerated without incident and aware of injection to be delivered in  27 hours.

## 2015-06-11 ENCOUNTER — Encounter (HOSPITAL_COMMUNITY): Payer: Self-pay | Admitting: Hematology & Oncology

## 2015-06-11 ENCOUNTER — Encounter (HOSPITAL_COMMUNITY): Payer: Self-pay

## 2015-06-11 ENCOUNTER — Encounter (HOSPITAL_BASED_OUTPATIENT_CLINIC_OR_DEPARTMENT_OTHER): Payer: Medicare Other

## 2015-06-11 VITALS — BP 134/70 | HR 74 | Temp 98.1°F | Resp 20 | Wt 144.4 lb

## 2015-06-11 DIAGNOSIS — C569 Malignant neoplasm of unspecified ovary: Secondary | ICD-10-CM | POA: Diagnosis not present

## 2015-06-11 DIAGNOSIS — D630 Anemia in neoplastic disease: Secondary | ICD-10-CM

## 2015-06-11 LAB — CA 125: CA 125: 2216 U/mL — ABNORMAL HIGH (ref 0.0–38.1)

## 2015-06-11 MED ORDER — ACETAMINOPHEN 325 MG PO TABS
650.0000 mg | ORAL_TABLET | Freq: Once | ORAL | Status: AC
  Administered 2015-06-11: 650 mg via ORAL

## 2015-06-11 MED ORDER — DIPHENHYDRAMINE HCL 25 MG PO CAPS
25.0000 mg | ORAL_CAPSULE | Freq: Once | ORAL | Status: AC
  Administered 2015-06-11: 25 mg via ORAL

## 2015-06-11 MED ORDER — SODIUM CHLORIDE 0.9 % IJ SOLN
10.0000 mL | INTRAMUSCULAR | Status: AC | PRN
  Administered 2015-06-11: 10 mL

## 2015-06-11 MED ORDER — SODIUM CHLORIDE 0.9 % IV SOLN
250.0000 mL | Freq: Once | INTRAVENOUS | Status: AC
  Administered 2015-06-11: 250 mL via INTRAVENOUS

## 2015-06-11 MED ORDER — DIPHENHYDRAMINE HCL 25 MG PO CAPS
ORAL_CAPSULE | ORAL | Status: AC
  Filled 2015-06-11: qty 1

## 2015-06-11 MED ORDER — ACETAMINOPHEN 325 MG PO TABS
ORAL_TABLET | ORAL | Status: AC
  Filled 2015-06-11: qty 2

## 2015-06-11 MED ORDER — HEPARIN SOD (PORK) LOCK FLUSH 100 UNIT/ML IV SOLN
INTRAVENOUS | Status: AC
  Filled 2015-06-11: qty 5

## 2015-06-11 MED ORDER — HEPARIN SOD (PORK) LOCK FLUSH 100 UNIT/ML IV SOLN
250.0000 [IU] | INTRAVENOUS | Status: AC | PRN
  Administered 2015-06-11: 500 [IU]

## 2015-06-11 NOTE — Progress Notes (Signed)
Latasha [name] Tolerated 2 units of blood discharged ambulatory

## 2015-06-11 NOTE — Patient Instructions (Signed)
[place] at [facility] Discharge Instructions  RECOMMENDATIONS MADE BY THE CONSULTANT AND ANY TEST RESULTS WILL BE SENT TO YOUR REFERRING PHYSICIAN.  2 units of blood today Return as scheduled Please call the clinic if you have any questions or concerns  Thank you for choosing [place] at [facility] to provide your oncology and hematology care.  To afford each patient quality time with our provider, please arrive at least 15 minutes before your scheduled appointment time.    You need to re-schedule your appointment should you arrive 10 or more minutes late.  We strive to give you quality time with our providers, and arriving late affects you and other patients whose appointments are after yours.  Also, if you no show three or more times for appointments you may be dismissed from the clinic at the providers discretion.     Again, thank you for choosing [facility].  Our hope is that these requests will decrease the amount of time that you wait before being seen by our physicians.       _____________________________________________________________  Should you have questions after your visit to [facility], please contact our office at [phone] between the hours of 8:30 a.m. and 4:30 p.m.  Voicemails left after 4:30 p.m. will not be returned until the following business day.  For prescription refill requests, have your pharmacy contact our office.

## 2015-06-12 LAB — TYPE AND SCREEN
ABO/RH(D): A POS
Antibody Screen: NEGATIVE
UNIT DIVISION: 0
UNIT DIVISION: 0
Unit division: 0

## 2015-06-17 ENCOUNTER — Encounter (HOSPITAL_BASED_OUTPATIENT_CLINIC_OR_DEPARTMENT_OTHER): Payer: Medicare Other

## 2015-06-17 DIAGNOSIS — C569 Malignant neoplasm of unspecified ovary: Secondary | ICD-10-CM | POA: Diagnosis present

## 2015-06-17 LAB — CBC WITH DIFFERENTIAL/PLATELET
BASOS ABS: 0 10*3/uL (ref 0.0–0.1)
BASOS PCT: 0 %
EOS PCT: 1 %
Eosinophils Absolute: 0.1 10*3/uL (ref 0.0–0.7)
HEMATOCRIT: 30.2 % — AB (ref 36.0–46.0)
Hemoglobin: 9.4 g/dL — ABNORMAL LOW (ref 12.0–15.0)
LYMPHS PCT: 8 %
Lymphs Abs: 0.9 10*3/uL (ref 0.7–4.0)
MCH: 29.2 pg (ref 26.0–34.0)
MCHC: 31.1 g/dL (ref 30.0–36.0)
MCV: 93.8 fL (ref 78.0–100.0)
MONO ABS: 1.4 10*3/uL — AB (ref 0.1–1.0)
Monocytes Relative: 14 %
NEUTROS ABS: 7.9 10*3/uL — AB (ref 1.7–7.7)
Neutrophils Relative %: 76 %
PLATELETS: 172 10*3/uL (ref 150–400)
RBC: 3.22 MIL/uL — AB (ref 3.87–5.11)
RDW: 17.8 % — AB (ref 11.5–15.5)
WBC: 10.5 10*3/uL (ref 4.0–10.5)

## 2015-06-17 NOTE — Progress Notes (Signed)
Labs drawn

## 2015-06-22 ENCOUNTER — Ambulatory Visit: Payer: Medicare Other | Admitting: Gynecologic Oncology

## 2015-06-24 ENCOUNTER — Ambulatory Visit (HOSPITAL_COMMUNITY): Payer: Medicare Other | Admitting: Hematology & Oncology

## 2015-06-24 ENCOUNTER — Ambulatory Visit (HOSPITAL_COMMUNITY): Payer: Medicare Other | Admitting: Oncology

## 2015-06-24 ENCOUNTER — Inpatient Hospital Stay (HOSPITAL_COMMUNITY): Payer: Medicare Other

## 2015-06-29 ENCOUNTER — Ambulatory Visit: Payer: Medicare Other | Admitting: Gynecologic Oncology

## 2015-07-01 ENCOUNTER — Encounter (HOSPITAL_BASED_OUTPATIENT_CLINIC_OR_DEPARTMENT_OTHER): Payer: Medicare Other

## 2015-07-01 ENCOUNTER — Encounter (HOSPITAL_COMMUNITY): Payer: Medicare Other | Attending: Oncology | Admitting: Hematology & Oncology

## 2015-07-01 ENCOUNTER — Encounter (HOSPITAL_COMMUNITY): Payer: Self-pay | Admitting: Hematology & Oncology

## 2015-07-01 VITALS — BP 124/44 | HR 83 | Temp 97.4°F | Resp 16 | Wt 136.3 lb

## 2015-07-01 DIAGNOSIS — Z5189 Encounter for other specified aftercare: Secondary | ICD-10-CM

## 2015-07-01 DIAGNOSIS — E538 Deficiency of other specified B group vitamins: Secondary | ICD-10-CM | POA: Insufficient documentation

## 2015-07-01 DIAGNOSIS — Z7982 Long term (current) use of aspirin: Secondary | ICD-10-CM | POA: Insufficient documentation

## 2015-07-01 DIAGNOSIS — D649 Anemia, unspecified: Secondary | ICD-10-CM | POA: Diagnosis not present

## 2015-07-01 DIAGNOSIS — C786 Secondary malignant neoplasm of retroperitoneum and peritoneum: Secondary | ICD-10-CM | POA: Diagnosis not present

## 2015-07-01 DIAGNOSIS — C771 Secondary and unspecified malignant neoplasm of intrathoracic lymph nodes: Secondary | ICD-10-CM | POA: Diagnosis not present

## 2015-07-01 DIAGNOSIS — I10 Essential (primary) hypertension: Secondary | ICD-10-CM | POA: Diagnosis not present

## 2015-07-01 DIAGNOSIS — Z5111 Encounter for antineoplastic chemotherapy: Secondary | ICD-10-CM

## 2015-07-01 DIAGNOSIS — E782 Mixed hyperlipidemia: Secondary | ICD-10-CM | POA: Insufficient documentation

## 2015-07-01 DIAGNOSIS — C569 Malignant neoplasm of unspecified ovary: Secondary | ICD-10-CM | POA: Insufficient documentation

## 2015-07-01 DIAGNOSIS — Z0000 Encounter for general adult medical examination without abnormal findings: Secondary | ICD-10-CM

## 2015-07-01 DIAGNOSIS — E559 Vitamin D deficiency, unspecified: Secondary | ICD-10-CM | POA: Diagnosis not present

## 2015-07-01 DIAGNOSIS — Z79899 Other long term (current) drug therapy: Secondary | ICD-10-CM | POA: Insufficient documentation

## 2015-07-01 DIAGNOSIS — Z23 Encounter for immunization: Secondary | ICD-10-CM

## 2015-07-01 LAB — URINALYSIS, ROUTINE W REFLEX MICROSCOPIC
Bilirubin Urine: NEGATIVE
Glucose, UA: NEGATIVE mg/dL
Hgb urine dipstick: NEGATIVE
Ketones, ur: NEGATIVE mg/dL
Leukocytes, UA: NEGATIVE
NITRITE: NEGATIVE
PH: 6 (ref 5.0–8.0)
Protein, ur: NEGATIVE mg/dL
UROBILINOGEN UA: 0.2 mg/dL (ref 0.0–1.0)

## 2015-07-01 LAB — COMPREHENSIVE METABOLIC PANEL
ALK PHOS: 78 U/L (ref 38–126)
ALT: 24 U/L (ref 14–54)
AST: 31 U/L (ref 15–41)
Albumin: 4.2 g/dL (ref 3.5–5.0)
Anion gap: 6 (ref 5–15)
BILIRUBIN TOTAL: 0.6 mg/dL (ref 0.3–1.2)
BUN: 26 mg/dL — ABNORMAL HIGH (ref 6–20)
CO2: 27 mmol/L (ref 22–32)
CREATININE: 0.98 mg/dL (ref 0.44–1.00)
Calcium: 9.5 mg/dL (ref 8.9–10.3)
Chloride: 101 mmol/L (ref 101–111)
GFR, EST NON AFRICAN AMERICAN: 57 mL/min — AB (ref >60)
Glucose, Bld: 107 mg/dL — ABNORMAL HIGH (ref 65–99)
Potassium: 4.2 mmol/L (ref 3.5–5.1)
Sodium: 134 mmol/L — ABNORMAL LOW (ref 135–145)
TOTAL PROTEIN: 7.7 g/dL (ref 6.5–8.1)

## 2015-07-01 LAB — CBC WITH DIFFERENTIAL/PLATELET
Basophils Absolute: 0 10*3/uL (ref 0.0–0.1)
Basophils Relative: 0 %
Eosinophils Absolute: 0 10*3/uL (ref 0.0–0.7)
Eosinophils Relative: 0 %
HEMATOCRIT: 28.6 % — AB (ref 36.0–46.0)
HEMOGLOBIN: 9.4 g/dL — AB (ref 12.0–15.0)
LYMPHS ABS: 1 10*3/uL (ref 0.7–4.0)
LYMPHS PCT: 6 %
MCH: 30.2 pg (ref 26.0–34.0)
MCHC: 32.9 g/dL (ref 30.0–36.0)
MCV: 92 fL (ref 78.0–100.0)
Monocytes Absolute: 1 10*3/uL (ref 0.1–1.0)
Monocytes Relative: 5 %
NEUTROS PCT: 89 %
Neutro Abs: 15.6 10*3/uL — ABNORMAL HIGH (ref 1.7–7.7)
Platelets: 299 10*3/uL (ref 150–400)
RBC: 3.11 MIL/uL — AB (ref 3.87–5.11)
RDW: 17 % — ABNORMAL HIGH (ref 11.5–15.5)
WBC: 17.6 10*3/uL — AB (ref 4.0–10.5)

## 2015-07-01 MED ORDER — PEGFILGRASTIM 6 MG/0.6ML ~~LOC~~ PSKT
PREFILLED_SYRINGE | SUBCUTANEOUS | Status: AC
Start: 2015-07-01 — End: 2015-07-01
  Filled 2015-07-01: qty 0.6

## 2015-07-01 MED ORDER — PACLITAXEL CHEMO INJECTION 300 MG/50ML
175.0000 mg/m2 | Freq: Once | INTRAVENOUS | Status: AC
  Administered 2015-07-01: 300 mg via INTRAVENOUS
  Filled 2015-07-01: qty 50

## 2015-07-01 MED ORDER — PEGFILGRASTIM 6 MG/0.6ML ~~LOC~~ PSKT
6.0000 mg | PREFILLED_SYRINGE | Freq: Once | SUBCUTANEOUS | Status: AC
  Administered 2015-07-01: 6 mg via SUBCUTANEOUS

## 2015-07-01 MED ORDER — SODIUM CHLORIDE 0.9 % IJ SOLN
10.0000 mL | INTRAMUSCULAR | Status: DC | PRN
  Administered 2015-07-01: 10 mL
  Filled 2015-07-01: qty 10

## 2015-07-01 MED ORDER — SODIUM CHLORIDE 0.9 % IV SOLN
407.5000 mg | Freq: Once | INTRAVENOUS | Status: AC
  Administered 2015-07-01: 410 mg via INTRAVENOUS
  Filled 2015-07-01: qty 41

## 2015-07-01 MED ORDER — DIPHENHYDRAMINE HCL 50 MG/ML IJ SOLN
50.0000 mg | Freq: Once | INTRAMUSCULAR | Status: AC
  Administered 2015-07-01: 50 mg via INTRAVENOUS
  Filled 2015-07-01: qty 1

## 2015-07-01 MED ORDER — HEPARIN SOD (PORK) LOCK FLUSH 100 UNIT/ML IV SOLN
500.0000 [IU] | Freq: Once | INTRAVENOUS | Status: AC | PRN
Start: 2015-07-01 — End: 2015-07-01
  Administered 2015-07-01: 500 [IU]
  Filled 2015-07-01: qty 5

## 2015-07-01 MED ORDER — INFLUENZA VAC SPLIT QUAD 0.5 ML IM SUSY
0.5000 mL | PREFILLED_SYRINGE | Freq: Once | INTRAMUSCULAR | Status: AC
  Administered 2015-07-01: 0.5 mL via INTRAMUSCULAR
  Filled 2015-07-01: qty 0.5

## 2015-07-01 MED ORDER — SODIUM CHLORIDE 0.9 % IV SOLN
Freq: Once | INTRAVENOUS | Status: AC
  Administered 2015-07-01: 11:00:00 via INTRAVENOUS

## 2015-07-01 MED ORDER — FAMOTIDINE IN NACL 20-0.9 MG/50ML-% IV SOLN
20.0000 mg | Freq: Once | INTRAVENOUS | Status: AC
  Administered 2015-07-01: 20 mg via INTRAVENOUS
  Filled 2015-07-01: qty 50

## 2015-07-01 MED ORDER — METOPROLOL TARTRATE 25 MG PO TABS: 25.0000 mg | ORAL_TABLET | Freq: Two times a day (BID) | ORAL | Status: DC

## 2015-07-01 MED ORDER — SODIUM CHLORIDE 0.9 % IV SOLN
Freq: Once | INTRAVENOUS | Status: AC
  Administered 2015-07-01: 11:00:00 via INTRAVENOUS
  Filled 2015-07-01: qty 8

## 2015-07-01 MED ORDER — DEXAMETHASONE 4 MG PO TABS: ORAL_TABLET | ORAL | Status: DC

## 2015-07-01 MED ORDER — TRAMADOL HCL 50 MG PO TABS: 50.0000 mg | ORAL_TABLET | Freq: Four times a day (QID) | ORAL | Status: DC | PRN

## 2015-07-01 NOTE — Progress Notes (Signed)
Latasha [name] presents today for injection per MD orders. Flu vaccine administered IM in left deltoid. Administration without incident. Patient tolerated well.

## 2015-07-01 NOTE — Progress Notes (Signed)
No PCP Per Patient No address on file  Preventative health care - Plan: Influenza vac split quadrivalent PF (FLUARIX) injection 0.5 mL  Ovarian cancer, unspecified laterality ([place]) - Plan: CT Chest W Contrast, CT Abdomen Pelvis W Contrast, dexamethasone (DECADRON) 4 MG tablet  CURRENT THERAPY: Carboplatin/Paclitaxel every 21 days beginning on 05/19/2015.  INTERVAL HISTORY: Latasha [name] 74 y.o. 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    Ovarian cancer ([place])   04/25/2015 - 04/28/2015 Hospital Admission Nausea/diarrhea.  Oncology consult completed on 04/27/2015.   04/25/2015 Tumor Marker CA 125- 3902.      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   04/27/2015 Procedure US Paracentesis- A total of approximately 3700 mL of amber colored fluid was removed. A fluid sample was sent for laboratory analysis.   04/27/2015 Imaging CT Chest- Mild left supraclavicular lymphadenopathy and moderate mediastinal lymphadenopathy involving the prevascular, subcarinal and bilateral pericardiophrenic nodal chains, likely metastatic.   04/27/2015 Pathology Results Diagnosis PERITONEAL/ASCITIC FLUID(SPECIMEN 1 OF 1 COLLECTED 04/27/15): MALIGNANT CELLS CONSISTENT WITH METASTATIC ADENOCARCINOMA.  The immunophenotype is most consistent with a gynecologic primary, most likely ovary.   05/08/2015 Miscellaneous Seen by Dr. Denman [name]- recommending Carboplatin/Paclitaxel x 3 cycles and return visit to see her (within 1 week of administration of third cycle) to evaluate for optimal sequencing of treatment modalities.   05/13/2015 - 05/15/2015 Hospital Admission Hospitalized for AKI   05/19/2015 -  Chemotherapy Carboplatin/Paclitaxel    [name] is here today with her son. She is a very cheerful woman, smiling and laughing.  She states that she's eating better than she was, but confirms that she's still not eating as well as she was a year ago.  She remarks that she doesn't take pain meds. She says her pain the first night after treatment was bad, but it got better with progressive nights.  In terms of possible neuropathy, she says her fingers have been fine and she's been able to button her shirts and write. She denies any numbness or tingling in her feet.  When asked about his opinion of her progress, her son says he thinks she's doing okay, but also confirms that she's not eating as well as she used to. He says that she does drink boost and ensure. She adds that she drinks two a day.  She says "some of it don't taste right" with regards to food.  She says her bowels are moving pretty well, and denies any diarrhea or watery stool.   Past Medical History  Diagnosis Date  . Hypertension   . Schizophrenia ([place])   . 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 Ulcerative colitis ([place])   . Mixed hyperlipidemia   . Vitamin D deficiency   . B12 deficiency   . Iron deficiency anemia   . Ovarian cancer ([place])     has Renal failure; Nausea without vomiting; Ascites, malignant; Ovarian cancer ([place]); Omental mass; Acute kidney injury ([place]); UTI (lower urinary tract infection); Normocytic anemia; IBD (inflammatory bowel disease); Schizophrenia, unspecified type ([place]); Diarrhea; Malnutrition of moderate degree ([place]); and Acute renal failure ([place]) on her problem list.     has No Known Allergies.  Current Outpatient Prescriptions on File Prior to Visit  Medication Sig Dispense Refill  .  amLODipine (NORVASC) 10 MG tablet Take 10 mg by mouth daily.    Marland Kitchen aspirin EC 81 MG tablet Take 81 mg by mouth daily.    Marland Kitchen CARBOPLATIN IV Inject into the vein every 21 ( twenty-one) days. To start 05/13/15    . Cholecalciferol (VITAMIN D-3) 1000 UNITS CAPS  Take 1,000 Units by mouth daily.     . ferrous sulfate 325 (65 FE) MG EC tablet Take 325 mg by mouth 2 (two) times daily.    . folic acid (FOLVITE) 1 MG tablet Take 1 mg by mouth daily.    Marland Kitchen lidocaine-prilocaine (EMLA) cream Apply a quarter size amount to port site 1 hour prior to chemo. Do not rub in. Cover with plastic wrap. 30 g 3  . lovastatin (MEVACOR) 10 MG tablet Take 10 mg by mouth at bedtime.    . mirtazapine (REMERON) 15 MG tablet Take 7.5 mg by mouth at bedtime.    . Multiple Vitamin (MULTIVITAMIN WITH MINERALS) TABS tablet Take 1 tablet by mouth daily.    . ondansetron (ZOFRAN) 8 MG tablet Take 1 tablet (8 mg total) by mouth every 8 (eight) hours as needed for nausea or vomiting. 30 tablet 2  . PACLitaxel (TAXOL IV) Inject into the vein every 21 ( twenty-one) days. To start 05/13/15    . Pegfilgrastim (NEULASTA ONPRO [place]) Inject into the skin every 21 ( twenty-one) days. To be applied to skin at the end of chemo.    . prochlorperazine (COMPAZINE) 10 MG tablet Take 1 tablet (10 mg total) by mouth every 6 (six) hours as needed (Nausea or vomiting). 30 tablet 1  . QUEtiapine (SEROQUEL XR) 300 MG 24 hr tablet Take 300 mg by mouth at bedtime.    . sulfaSALAzine (AZULFIDINE) 500 MG tablet Take 500 mg by mouth 3 (three) times daily.    . vitamin B-12 (CYANOCOBALAMIN) 1000 MCG tablet Take 1,000 mcg by mouth daily.     No current facility-administered medications on file prior to visit.    Past Surgical History  Procedure Laterality Date  . Replacement total knee      right knee in 2003  . Paracentesis  04/27/15  . Portacath placement Right 04/2015    Denies any headaches, dizziness, double vision, fevers, chills, night sweats, nausea, vomiting, diarrhea, constipation, chest pain, heart palpitations, shortness of breath, blood in stool, black tarry stool, urinary pain, urinary burning, urinary frequency, hematuria.  14 point review of systems was performed and is negative except as detailed  under history of present illness and above  PHYSICAL EXAMINATION  ECOG PERFORMANCE STATUS: 1 - Symptomatic but completely ambulatory  Filed Vitals:   07/01/15 0900  BP: 124/44  Pulse: 83  Temp: 97.4 F (36.3 C)  Resp: 16    GENERAL:alert, no distress, well nourished, well developed, comfortable, cooperative, smiling and accompanied by her son, Latasha [name]. SKIN: skin color, texture, turgor are normal, no rashes or significant lesions HEAD: Normocephalic, No masses, lesions, tenderness or abnormalities EYES: normal, PERRLA, EOMI, Conjunctiva are pink and non-injected EARS: External ears normal OROPHARYNX:lips, buccal mucosa, and tongue normal and mucous membranes are moist  NECK: supple, trachea midline LYMPH:  NO palpable adenopathy in the neck, axillae  BREAST:not examined LUNGS: CTA B/L HEART: RRR without murmur, rub or gallop. ABDOMEN:abdomen soft and non-tender, no rebound or guarding BACK: Back symmetric, no curvature. EXTREMITIES:less then 2 second capillary refill, no joint deformities, effusion, or inflammation, no skin discoloration, positive findings:   Trace pitting edema at the  ankles NEURO: alert & oriented x 3 with fluent speech, no focal motor/sensory deficits, gait normal   LABORATORY DATA: I have reviewed the data as listed  CBC    Component Value Date/Time   WBC 17.6* 07/01/2015 0945   RBC 3.11* 07/01/2015 0945   RBC 2.55* 06/10/2015 1015   HGB 9.4* 07/01/2015 0945   HCT 28.6* 07/01/2015 0945   PLT 299 07/01/2015 0945   MCV 92.0 07/01/2015 0945   MCH 30.2 07/01/2015 0945   MCHC 32.9 07/01/2015 0945   RDW 17.0* 07/01/2015 0945   LYMPHSABS 1.0 07/01/2015 0945   MONOABS 1.0 07/01/2015 0945   EOSABS 0.0 07/01/2015 0945   BASOSABS 0.0 07/01/2015 0945      Chemistry      Component Value Date/Time   NA 134* 07/01/2015 0945   K 4.2 07/01/2015 0945   CL 101 07/01/2015 0945   CO2 27 07/01/2015 0945   BUN 26* 07/01/2015 0945   CREATININE 0.98 07/01/2015  0945      Component Value Date/Time   CALCIUM 9.5 07/01/2015 0945   ALKPHOS 78 07/01/2015 0945   AST 31 07/01/2015 0945   ALT 24 07/01/2015 0945   BILITOT 0.6 07/01/2015 0945     Lab Results  Component Value Date   CA125 2216.0* 06/10/2015    Lab Results  Component Value Date   CEA 0.5 04/26/2015     ASSESSMENT AND PLAN:  Stage IVB Ovarian Carcinoma Malignant Ascites Anemia Leukocytosis  She has done very well with chemotherapy. Her clinical exam is markedly improved. Her anemia is stable today. I have recommended another transfusion. We may need to consider the addition of stool cards if this remains persistent in spite of improvement in her overall malignancy.   Continue with treatment as planned, carboplatin/taxol. We will obtain scans prior to cycle #4. Plan is for treatment with 6 cycles of carboplatin/taxol. . She does need refills today; dexamethasone and metroprolol, to [facility] in [place]. She was also given a prescription for tramadol for her post treatment related musculoskeletal pain.   Orders Placed This Encounter  Procedures  . CT Chest W Contrast    Amy, esigned, will get, nbs     Standing Status: Future     Number of Occurrences:      Standing Expiration Date: 06/30/2016    Order Specific Question:  Reason for Exam (SYMPTOM  OR DIAGNOSIS REQUIRED)    Answer:  ovarian cancer, restaging    Order Specific Question:  Preferred imaging location?    Answer:  [facility]  . CT Abdomen Pelvis W Contrast    Amy, esigned, will get, nbs    Standing Status: Future     Number of Occurrences:      Standing Expiration Date: 06/30/2016    Order Specific Question:  Reason for Exam (SYMPTOM  OR DIAGNOSIS REQUIRED)    Answer:  ovarian cancer, restaging    Order Specific Question:  Preferred imaging location?    Answer:  [facility]    All questions were answered. The patient knows to call the clinic with any problems, questions or  concerns. We can certainly see the patient much sooner if necessary.  This document serves as a record of services personally performed by Ancil Linsey, MD. It was created on her behalf by Toni Amend, a trained medical scribe. The creation of this record is based on the scribe's personal observations and the provider's statements to them. This document has been checked  and approved by the attending provider.  I have reviewed the above documentation for accuracy and completeness, and I agree with the above.  This note is electronically signed by: Molli Hazard, MD  11:06 AM

## 2015-07-01 NOTE — Progress Notes (Signed)
Neulasta OBI placement per MD orders.  OBI device filled per protocol and placed on left upper arm.  Needle/catheter placement noted prior to patient leaving. Tolerated without incident and aware of injection to be delivered in 27 hours.  Injector in place and engaged with green light indicator on flashing.  Tolerated tx w/o adverse reaction.  VSS.  Discharged ambulatory in c/o son for transport home.

## 2015-07-01 NOTE — Patient Instructions (Signed)
[place] at [facility] Discharge Instructions  RECOMMENDATIONS MADE BY THE CONSULTANT AND ANY TEST RESULTS WILL BE SENT TO YOUR REFERRING PHYSICIAN.  Exam and discussion by Dr. Whitney Muse. You are doing well If blood counts are ok you will get chemotherapy today. Will get CT scans at the end of the month to see how you are doing. Report fevers, uncontrolled nausea, vomiting or other concerns.  Follow-up in 3 weeks.   Thank you for choosing [place] at [facility] to provide your oncology and hematology care.  To afford each patient quality time with our provider, please arrive at least 15 minutes before your scheduled appointment time.    You need to re-schedule your appointment should you arrive 10 or more minutes late.  We strive to give you quality time with our providers, and arriving late affects you and other patients whose appointments are after yours.  Also, if you no show three or more times for appointments you may be dismissed from the clinic at the providers discretion.     Again, thank you for choosing [facility].  Our hope is that these requests will decrease the amount of time that you wait before being seen by our physicians.       _____________________________________________________________  Should you have questions after your visit to [facility], please contact our office at [phone] between the hours of 8:30 a.m. and 4:30 p.m.  Voicemails left after 4:30 p.m. will not be returned until the following business day.  For prescription refill requests, have your pharmacy contact our office.

## 2015-07-01 NOTE — Patient Instructions (Signed)
[facility] Discharge Instructions for Patients Receiving Chemotherapy  Today you received the following chemotherapy agents:  Taxol and carboplatin.   To help prevent nausea and vomiting after your treatment, we encourage you to take your nausea medication as prescribed.  If you develop nausea and vomiting, or diarrhea that is not controlled by your medication, call the clinic.  The clinic phone number is [phone]. Office hours are Monday-Friday 8:30am-5:00pm.  BELOW ARE SYMPTOMS THAT SHOULD BE REPORTED IMMEDIATELY:  *FEVER GREATER THAN 101.0 F  *CHILLS WITH OR WITHOUT FEVER  NAUSEA AND VOMITING THAT IS NOT CONTROLLED WITH YOUR NAUSEA MEDICATION  *UNUSUAL SHORTNESS OF BREATH  *UNUSUAL BRUISING OR BLEEDING  TENDERNESS IN MOUTH AND THROAT WITH OR WITHOUT PRESENCE OF ULCERS  *URINARY PROBLEMS  *BOWEL PROBLEMS  UNUSUAL RASH Items with * indicate a potential emergency and should be followed up as soon as possible. If you have an emergency after office hours please contact your primary care physician or go to the nearest emergency department.  Please call the clinic during office hours if you have any questions or concerns.   You may also contact the Patient Navigator at [phone] should you have any questions or need assistance in obtaining follow up care. _____________________________________________________________________ Have you asked about our STAR program?    STAR stands for Survivorship Training and Rehabilitation, and this is a nationally recognized cancer care program that focuses on survivorship and rehabilitation.  Cancer and cancer treatments may cause problems, such as, pain, making you feel tired and keeping you from doing the things that you need or want to do. Cancer rehabilitation can help. Our goal is to reduce these troubling effects and help you have the best quality of life possible.  You may receive a survey from a nurse that asks  questions about your current state of health.  Based on the survey results, all eligible patients will be referred to the [facility] program for an evaluation so we can better serve you! A frequently asked questions sheet is available upon request.

## 2015-07-02 LAB — CA 125: CA 125: 249 U/mL — ABNORMAL HIGH (ref 0.0–38.1)

## 2015-07-06 ENCOUNTER — Encounter: Payer: Self-pay | Admitting: Dietician

## 2015-07-06 NOTE — Progress Notes (Signed)
Patient identified to be at risk for malnutrition on the MST secondary to Weight loss and poor PO intake  Contacted Pt by Phone  Wt Readings from Last 10 Encounters:  07/01/15 136 lb 4.8 oz (61.825 kg)  06/11/15 144 lb 6.4 oz (65.499 kg)  06/10/15 143 lb 12.8 oz (65.227 kg)  05/28/15 144 lb 12.8 oz (65.681 kg)  05/19/15 156 lb 11.2 oz (71.079 kg)  05/13/15 147 lb 11.3 oz (67 kg)  05/11/15 150 lb 11.2 oz (68.357 kg)  05/08/15 150 lb 11.2 oz (68.357 kg)  05/04/15 152 lb 14.4 oz (69.355 kg)  04/28/15 154 lb 11.2 oz (70.171 kg)   Patient weight has fallen by about 8 lbs in 1 month and 18 lbs in 2 months.   Got patients answering machine. Left message w/ RD contact information  Per oncologist notes 10/12. Pt has had an improvement in appetite. She has had some taste changes. No issues with her bowels were noted.   Pt reportedly drinks 2 Boost/Ensure each day. Given her fast rate of weight loss, she would be very much appropriate for a case of Ensure. I will discuss this with her next time we speak.   Burtis Junes RD, LDN Nutrition Pager: [phone] 07/06/2015 3:11 PM

## 2015-07-15 ENCOUNTER — Inpatient Hospital Stay (HOSPITAL_COMMUNITY): Payer: Medicare Other

## 2015-07-15 ENCOUNTER — Ambulatory Visit (HOSPITAL_COMMUNITY): Payer: Medicare Other | Admitting: Oncology

## 2015-07-16 ENCOUNTER — Ambulatory Visit: Payer: Medicare Other | Admitting: Gynecologic Oncology

## 2015-07-20 ENCOUNTER — Ambulatory Visit (HOSPITAL_COMMUNITY)
Admission: RE | Admit: 2015-07-20 | Discharge: 2015-07-20 | Disposition: A | Discharge status: Home / Self Care | Payer: Medicare Other | Source: Ambulatory Visit | Attending: Hematology & Oncology | Admitting: Hematology & Oncology

## 2015-07-20 ENCOUNTER — Encounter: Payer: Self-pay | Admitting: Gynecologic Oncology

## 2015-07-20 ENCOUNTER — Telehealth (HOSPITAL_COMMUNITY): Payer: Self-pay

## 2015-07-20 ENCOUNTER — Ambulatory Visit (HOSPITAL_BASED_OUTPATIENT_CLINIC_OR_DEPARTMENT_OTHER): Payer: Medicare Other | Admitting: Gynecologic Oncology

## 2015-07-20 VITALS — BP 134/86 | HR 82 | Temp 97.8°F | Resp 18 | Wt 133.6 lb

## 2015-07-20 DIAGNOSIS — R918 Other nonspecific abnormal finding of lung field: Secondary | ICD-10-CM | POA: Diagnosis not present

## 2015-07-20 DIAGNOSIS — Z79899 Other long term (current) drug therapy: Secondary | ICD-10-CM | POA: Diagnosis not present

## 2015-07-20 DIAGNOSIS — C569 Malignant neoplasm of unspecified ovary: Secondary | ICD-10-CM

## 2015-07-20 DIAGNOSIS — K5730 Diverticulosis of large intestine without perforation or abscess without bleeding: Secondary | ICD-10-CM | POA: Diagnosis not present

## 2015-07-20 DIAGNOSIS — C786 Secondary malignant neoplasm of retroperitoneum and peritoneum: Secondary | ICD-10-CM | POA: Diagnosis not present

## 2015-07-20 DIAGNOSIS — Z08 Encounter for follow-up examination after completed treatment for malignant neoplasm: Secondary | ICD-10-CM | POA: Diagnosis not present

## 2015-07-20 DIAGNOSIS — C801 Malignant (primary) neoplasm, unspecified: Principal | ICD-10-CM

## 2015-07-20 MED ORDER — IOHEXOL 300 MG/ML  SOLN
100.0000 mL | Freq: Once | INTRAMUSCULAR | Status: AC | PRN
  Administered 2015-07-20: 100 mL via INTRAVENOUS

## 2015-07-20 NOTE — Telephone Encounter (Signed)
Per Dr. Serita Grit office patient is to have surgery on 07/28/15.  Requests that chemo be held this week and that we see her in follow-up 3 - 4 weeks after surgery.

## 2015-07-20 NOTE — Patient Instructions (Signed)
Preparing for your Surgery  Plan for surgery on November 8 with Dr. Denman George.  You will be scheduled for an exploratory laparotomy, total abdominal hysterectomy, bilateral salpingo-oophorectomy, omentectomy, debulking.  You will not have your chemotherapy treatment this Wed.  We will contact Forestine Na.  +++++NO MORE ASPIRIN AS OF TODAY++++  Pre-operative Testing -You will receive a phone call from presurgical testing at [facility] to arrange for a pre-operative testing appointment before your surgery.  This appointment normally occurs one to two weeks before your scheduled surgery.   -Bring your insurance card, copy of an advanced directive if applicable, medication list  -At that visit, you will be asked to sign a consent for a possible blood transfusion in case a transfusion becomes necessary during surgery.  The need for a blood transfusion is rare but having consent is a necessary part of your care.     -You should not be taking blood thinners or aspirin at least ten days prior to surgery unless instructed by your surgeon.  Day Before Surgery at [place] will be asked to take in only clear liquids the day before surgery.  Examples of clear liquids include broths, jello, and clear juices.  Avoid carbonated beverages.  You will be advised to have nothing to eat or drink after midnight the evening before.    Your role in recovery Your role is to become active as soon as directed by your doctor, while still giving yourself time to heal.  Rest when you feel tired. You will be asked to do the following in order to speed your recovery:  - Cough and breathe deeply. This helps toclear and expand your lungs and can prevent pneumonia. You may be given a spirometer to practice deep breathing. A staff member will show you how to use the spirometer. - Do mild physical activity. Walking or moving your legs help your circulation and body functions return to normal. A staff member will  help you when you try to walk and will provide you with simple exercises. Do not try to get up or walk alone the first time. - Actively manage your pain. Managing your pain lets you move in comfort. We will ask you to rate your pain on a scale of zero to 10. It is your responsibility to tell your doctor or nurse where and how much you hurt so your pain can be treated.  Special Considerations -If you are diabetic, you may be placed on insulin after surgery to have closer control over your blood sugars to promote healing and recovery.  This does not mean that you will be discharged on insulin.  If applicable, your oral antidiabetics will be resumed when you are tolerating a solid diet.  -Your final pathology results from surgery should be available by the Friday after surgery and the results will be relayed to you when available.  Blood Transfusion Information WHAT IS A BLOOD TRANSFUSION? A transfusion is the replacement of blood or some of its parts. Blood is made up of multiple cells which provide different functions.  Red blood cells carry oxygen and are used for blood loss replacement.  White blood cells fight against infection.  Platelets control bleeding.  Plasma helps clot blood.  Other blood products are available for specialized needs, such as hemophilia or other clotting disorders. BEFORE THE TRANSFUSION  Who gives blood for transfusions?   You may be able to donate blood to be used at a later date on yourself (autologous donation).  Relatives can be asked to donate blood. This is generally not any safer than if you have received blood from a stranger. The same precautions are taken to ensure safety when a relative's blood is donated.  Healthy volunteers who are fully evaluated to make sure their blood is safe. This is blood bank blood. Transfusion therapy is the safest it has ever been in the practice of medicine. Before blood is taken from a donor, a complete history is taken  to make sure that person has no history of diseases nor engages in risky social behavior (examples are intravenous drug use or sexual activity with multiple partners). The donor's travel history is screened to minimize risk of transmitting infections, such as malaria. The donated blood is tested for signs of infectious diseases, such as HIV and hepatitis. The blood is then tested to be sure it is compatible with you in order to minimize the chance of a transfusion reaction. If you or a relative donates blood, this is often done in anticipation of surgery and is not appropriate for emergency situations. It takes many days to process the donated blood. RISKS AND COMPLICATIONS Although transfusion therapy is very safe and saves many lives, the main dangers of transfusion include:   Getting an infectious disease.  Developing a transfusion reaction. This is an allergic reaction to something in the blood you were given. Every precaution is taken to prevent this. The decision to have a blood transfusion has been considered carefully by your caregiver before blood is given. Blood is not given unless the benefits outweigh the risks.

## 2015-07-20 NOTE — Progress Notes (Signed)
Followup Note: Gyn-Onc  Latasha [name] 74 y.o. 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 peritoneal carcinomatosis    follow up    Assessment/Plan:  Latasha [name]  is a 74 y.o.  year old with stage IVB ovarian cancer, with peritoneal carcinomatosis and pulmonary nodal metastases (clinical) on chest CT, confirmed on cytology of the peritoneal ascites.  She has had an excellent response (radiographically and serologically) to her first 3 cycles of chemotherapy.  She has persistent peritoneal implants including in the left upper quadrant omentum.  On pelvic exam she has no persistent nodularity in the RV cul de sac. She no longer has chest disease measurable on CT imaging.  I am recommending interval debulking on 07/28/15. This will be an ex lap, TAH, BSO, omentectomy, debulking to <1cm3 of residual disease.  I discussed surgical complications including  bleeding, infection, damage to internal organs (such as bladder,ureters, bowels), blood clot, reoperation and rehospitalization.  I discussed anticipated length of stay postop (3-5 days).  She will need additional chemotherapy (3 cycles minimum) after debulking.   HPI: Latasha [name] is a very pleasant 74 year old G1 who is seen in consultation at the request of Dr Whitney Muse for (clinical) stage IVB ovarian cancer. The patient developed symptoms of progressive abdominal fullness and discomfort over the course of several months. In August, 2016 she presented to the [place] where imaging of the abdomen and pelvis was obtained for diarrhea, nausea and emesis. Imaging on 04/25/15 revealed large volume ascites, carcinomatosis and peritoneal masses measuring up to 5.1cm. The patient underwent paracentesis with cytology on 04/27/15 which revealed metastatic adenocarcinoma consistent on immunostains with gyn primary.  CT of the chest on 04/27/15 revealed mild left  supraclavicular lymphadenopathy, and mediastinal lymphadenopathy consistent with metastatic disease. There were <16m pulmonary nodules also seen which were indeterminant. There were trace pleural effusions seen.   CA 125 on 04/25/15: 3902.  She then went on to receive 3 cycles of paclitaxel and carboplatin chemotherapy with Dr PWhitney Muse Cycle 3 was on 07/01/15.  CA 125 on 07/01/15 was 249.  CT scan on 07/20/15 showed: Previously noted ascites has resolved. No pneumoperitoneum. Many of the previously noted peritoneal implants are no longer confidently identified on today's examination. There are few smaller implants noted, the largest of which is in th  left side of the abdomen anteriorly measuring 9 x 11 mm. There is also a 10 x 7 mm lesion superficial to the splenic flexure of the colon which is substantially smaller than the prior study (previously 2.8 x 1.7 cm).  There was resolution of the chest adenopathy.  She has tolerated chemotherapy well with a good appetites and good general function.   Current Meds:  Outpatient Encounter Prescriptions as of 07/20/2015  Medication Sig  . amLODipine (NORVASC) 10 MG tablet Take 10 mg by mouth daily.  .Marland Kitchenaspirin EC 81 MG tablet Take 81 mg by mouth daily.  .Marland KitchenCARBOPLATIN IV Inject into the vein every 21 ( twenty-one) days. To start 05/13/15  . Cholecalciferol (VITAMIN D-3) 1000 UNITS CAPS Take 1,000 Units by mouth daily.   .Marland Kitchendexamethasone (DECADRON) 4 MG tablet The day before chemo take 5 tablets (241m in the am and 5 tablets (2061min the pm. The morning of chemo take 5 tablets (66m21m . ferrous sulfate 325 (65 FE) MG EC tablet Take 325 mg by mouth 2 (two) times daily.  .Marland Kitchen  folic acid (FOLVITE) 1 MG tablet Take 1 mg by mouth daily.  Marland Kitchen lidocaine-prilocaine (EMLA) cream Apply a quarter size amount to port site 1 hour prior to chemo. Do not rub in. Cover with plastic wrap.  . lovastatin (MEVACOR) 10 MG tablet Take 10 mg by mouth at bedtime.  . metoprolol  tartrate (LOPRESSOR) 25 MG tablet Take 1 tablet (25 mg total) by mouth 2 (two) times daily.  . mirtazapine (REMERON) 15 MG tablet Take 7.5 mg by mouth at bedtime.  . Multiple Vitamin (MULTIVITAMIN WITH MINERALS) TABS tablet Take 1 tablet by mouth daily.  . ondansetron (ZOFRAN) 8 MG tablet Take 1 tablet (8 mg total) by mouth every 8 (eight) hours as needed for nausea or vomiting.  Marland Kitchen PACLitaxel (TAXOL IV) Inject into the vein every 21 ( twenty-one) days. To start 05/13/15  . Pegfilgrastim (NEULASTA ONPRO [place]) Inject into the skin every 21 ( twenty-one) days. To be applied to skin at the end of chemo.  . prochlorperazine (COMPAZINE) 10 MG tablet Take 1 tablet (10 mg total) by mouth every 6 (six) hours as needed (Nausea or vomiting).  . QUEtiapine (SEROQUEL XR) 300 MG 24 hr tablet Take 300 mg by mouth at bedtime.  . sulfaSALAzine (AZULFIDINE) 500 MG tablet Take 500 mg by mouth 3 (three) times daily.  . traMADol (ULTRAM) 50 MG tablet Take 1 tablet (50 mg total) by mouth every 6 (six) hours as needed.  . vitamin B-12 (CYANOCOBALAMIN) 1000 MCG tablet Take 1,000 mcg by mouth daily.   No facility-administered encounter medications on file as of 07/20/2015.    Allergy: No Known Allergies  Social Hx:   Social History   Social History  . Marital Status: Single    Spouse Name: N/A  . Number of Children: N/A  . Years of Education: N/A   Occupational History  . Not on file.   Social History Main Topics  . Smoking status: Never Smoker   . Smokeless tobacco: Never Used  . Alcohol Use: No  . Drug Use: No  . Sexual Activity: Not on file   Other Topics Concern  . Not on file   Social History Narrative    Past Surgical Hx:  Past Surgical History  Procedure Laterality Date  . Replacement total knee      right knee in 2003  . Paracentesis  04/27/15  . Portacath placement Right 04/2015    Past Medical Hx:  Past Medical History  Diagnosis Date  . Hypertension   . Schizophrenia ([place])   .  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 Ulcerative colitis ([place])   . Mixed hyperlipidemia   . Vitamin D deficiency   . B12 deficiency   . Iron deficiency anemia   . Ovarian cancer [facility])     Past Gynecological History:  SVD x 1  No LMP recorded. Patient is postmenopausal.  Family Hx:  Family History  Problem Relation Age of Onset  . Prostate cancer Brother   . Cancer Paternal Uncle   . Lung cancer Maternal Grandmother   . Hypertension Maternal Grandfather     Review of Systems:  Constitutional  Feels well,    ENT Normal appearing ears and nares bilaterally Skin/Breast  No rash, sores, jaundice, itching, dryness Cardiovascular  No chest pain, shortness of breath, or edema  Pulmonary  No cough or wheeze.  Gastro Intestinal  No nausea, vomitting, or diarrhoea. No bright red blood per rectum, no abdominal pain, change in bowel movement, or constipation.  Genito Urinary  No frequency, urgency, dysuria, no postmenopausal bleeding Musculo Skeletal  No myalgia, arthralgia, joint swelling or pain  Neurologic  No weakness, numbness, change in gait,  Psychology  No depression, anxiety, insomnia.   Vitals:  Blood pressure 134/86, pulse 82, temperature 97.8 F (36.6 C), temperature source Oral, resp. rate 18, weight 133 lb 9 oz (60.584 kg).  Physical Exam: WD in NAD Neck  Supple NROM, without any enlargements.  Lymph Node Survey No cervical supraclavicular or inguinal adenopathy Cardiovascular  Pulse normal rate, regularity and rhythm. S1 and S2 normal.  Lungs  Clear to auscultation bilateraly, without wheezes/crackles/rhonchi. Good air movement.  Skin  No rash/lesions/breakdown  Psychiatry  Alert and oriented to person, place, and time  Abdomen  Normoactive bowel sounds, abdomen soft, non distended, non-tender and thin without evidence of hernia.  Back No CVA tenderness Genito Urinary  Vulva/vagina: Normal external female genitalia.  No lesions. No discharge or  bleeding.  Bladder/urethra:  No lesions or masses, well supported bladder  Vagina: normal with some prolapse and distension from peritoneal ascites.  Cervix: Normal appearing, no lesions.  Uterus: Small, mobile, no parametrial involvement or nodularity.  Adnexa: right sided cystic mass is improved but still palpable. No RV septal nodularity appreciated. Extremities  No bilateral cyanosis, clubbing or edema.   [name], MD   07/20/2015, 2:21 PM

## 2015-07-22 ENCOUNTER — Inpatient Hospital Stay (HOSPITAL_COMMUNITY): Payer: Medicare Other

## 2015-07-22 ENCOUNTER — Ambulatory Visit (HOSPITAL_COMMUNITY): Payer: Medicare Other | Admitting: Oncology

## 2015-07-24 ENCOUNTER — Ambulatory Visit: Payer: Medicare Other | Admitting: Gynecologic Oncology

## 2015-07-24 ENCOUNTER — Encounter (HOSPITAL_COMMUNITY): Payer: Self-pay

## 2015-07-24 ENCOUNTER — Other Ambulatory Visit: Payer: Self-pay

## 2015-07-24 ENCOUNTER — Encounter (HOSPITAL_COMMUNITY)
Admission: RE | Admit: 2015-07-24 | Discharge: 2015-07-24 | Disposition: A | Discharge status: Home / Self Care | Payer: Medicare Other | Source: Ambulatory Visit | Attending: Gynecologic Oncology | Admitting: Gynecologic Oncology

## 2015-07-24 DIAGNOSIS — C569 Malignant neoplasm of unspecified ovary: Secondary | ICD-10-CM | POA: Insufficient documentation

## 2015-07-24 DIAGNOSIS — Z01818 Encounter for other preprocedural examination: Secondary | ICD-10-CM | POA: Diagnosis not present

## 2015-07-24 HISTORY — DX: Unspecified osteoarthritis, unspecified site: M19.90

## 2015-07-24 HISTORY — DX: Personal history of antineoplastic chemotherapy: Z92.21

## 2015-07-24 HISTORY — DX: Personal history of other medical treatment: Z92.89

## 2015-07-24 HISTORY — DX: Unspecified cataract: H26.9

## 2015-07-24 LAB — COMPREHENSIVE METABOLIC PANEL
ALBUMIN: 4.5 g/dL (ref 3.5–5.0)
ALT: 23 U/L (ref 14–54)
AST: 28 U/L (ref 15–41)
Alkaline Phosphatase: 75 U/L (ref 38–126)
Anion gap: 10 (ref 5–15)
BUN: 23 mg/dL — AB (ref 6–20)
CHLORIDE: 101 mmol/L (ref 101–111)
CO2: 28 mmol/L (ref 22–32)
CREATININE: 1.3 mg/dL — AB (ref 0.44–1.00)
Calcium: 9.7 mg/dL (ref 8.9–10.3)
GFR calc Af Amer: 47 mL/min — ABNORMAL LOW (ref >60)
GFR, EST NON AFRICAN AMERICAN: 40 mL/min — AB (ref >60)
GLUCOSE: 107 mg/dL — AB (ref 65–99)
POTASSIUM: 4.1 mmol/L (ref 3.5–5.1)
Sodium: 139 mmol/L (ref 135–145)
Total Bilirubin: 0.7 mg/dL (ref 0.3–1.2)
Total Protein: 7.4 g/dL (ref 6.5–8.1)

## 2015-07-24 LAB — URINALYSIS, ROUTINE W REFLEX MICROSCOPIC
Bilirubin Urine: NEGATIVE
GLUCOSE, UA: NEGATIVE mg/dL
HGB URINE DIPSTICK: NEGATIVE
Ketones, ur: NEGATIVE mg/dL
LEUKOCYTES UA: NEGATIVE
Nitrite: NEGATIVE
PH: 6 (ref 5.0–8.0)
PROTEIN: NEGATIVE mg/dL
SPECIFIC GRAVITY, URINE: 1.009 (ref 1.005–1.030)
Urobilinogen, UA: 0.2 mg/dL (ref 0.0–1.0)

## 2015-07-24 LAB — CBC WITH DIFFERENTIAL/PLATELET
BASOS ABS: 0 10*3/uL (ref 0.0–0.1)
BASOS PCT: 0 %
EOS PCT: 1 %
Eosinophils Absolute: 0.1 10*3/uL (ref 0.0–0.7)
HEMATOCRIT: 22.8 % — AB (ref 36.0–46.0)
Hemoglobin: 7.3 g/dL — ABNORMAL LOW (ref 12.0–15.0)
LYMPHS PCT: 24 %
Lymphs Abs: 2.2 10*3/uL (ref 0.7–4.0)
MCH: 32 pg (ref 26.0–34.0)
MCHC: 32 g/dL (ref 30.0–36.0)
MCV: 100 fL (ref 78.0–100.0)
Monocytes Absolute: 1 10*3/uL (ref 0.1–1.0)
Monocytes Relative: 11 %
NEUTROS ABS: 5.7 10*3/uL (ref 1.7–7.7)
Neutrophils Relative %: 64 %
PLATELETS: 240 10*3/uL (ref 150–400)
RBC: 2.28 MIL/uL — ABNORMAL LOW (ref 3.87–5.11)
RDW: 19.4 % — ABNORMAL HIGH (ref 11.5–15.5)
WBC: 8.9 10*3/uL (ref 4.0–10.5)

## 2015-07-24 NOTE — Patient Instructions (Addendum)
Latasha [name]  07/24/2015   Your procedure is scheduled on: Tuesday July 28, 2015   Report to [facility] Main  Entrance take [place]  elevators to 3rd floor to  [place] at 10:30 AM.  Call this number if you have problems the morning of surgery [phone]   Remember: ONLY 1 PERSON MAY GO WITH YOU TO SHORT STAY TO GET  READY MORNING OF [place].               CLEAR LIQUID DIET DAY PRIOR TO SURGERY. NO CARBONATED BEVERAGES. MAY TAKE CLEAR LIQUIDS TILL 7 am day of surgery then nothing by mouth.      Take these medicines the morning of surgery with A SIP OF WATER: Metoprolol                                You may not have any metal on your body including hair pins and              piercings  Do not wear jewelry, make-up, lotions, powders or perfumes, deodorant             Do not wear nail polish.  Do not shave  48 hours prior to surgery.               Do not bring valuables to the hospital. [place].  Contacts, dentures or bridgework may not be worn into surgery.  Leave suitcase in the car. After surgery it may be brought to your room.                Please read over the following fact sheets you were given:INCENTIVE SPIROMETER; BLOOD TRANSFUSION INFORMATION SHEET _____________________________________________________________________             [facility] - Preparing for Surgery Before surgery, you can play an important role.  Because skin is not sterile, your skin needs to be as free of germs as possible.  You can reduce the number of germs on your skin by washing with CHG (chlorahexidine gluconate) soap before surgery.  CHG is an antiseptic cleaner which kills germs and bonds with the skin to continue killing germs even after washing. Please DO NOT use if you have an allergy to CHG or antibacterial soaps.  If your skin becomes reddened/irritated stop using the CHG and inform your nurse when  you arrive at Short Stay. Do not shave (including legs and underarms) for at least 48 hours prior to the first CHG shower.  You may shave your face/neck. Please follow these instructions carefully:  1.  Shower with CHG Soap the night before surgery and the  morning of Surgery.  2.  If you choose to wash your hair, wash your hair first as usual with your  normal  shampoo.  3.  After you shampoo, rinse your hair and body thoroughly to remove the  shampoo.                           4.  Use CHG as you would any other liquid soap.  You can apply chg directly  to the skin and wash  Gently with a scrungie or clean washcloth.  5.  Apply the CHG Soap to your body ONLY FROM THE NECK DOWN.   Do not use on face/ open                           Wound or open sores. Avoid contact with eyes, ears mouth and genitals (private parts).                       Wash face,  Genitals (private parts) with your normal soap.             6.  Wash thoroughly, paying special attention to the area where your surgery  will be performed.  7.  Thoroughly rinse your body with warm water from the neck down.  8.  DO NOT shower/wash with your normal soap after using and rinsing off  the CHG Soap.                9.  Pat yourself dry with a clean towel.            10.  Wear clean pajamas.            11.  Place clean sheets on your bed the night of your first shower and do not  sleep with pets. Day of Surgery : Do not apply any lotions/deodorants the morning of surgery.  Please wear clean clothes to the hospital/surgery center.  FAILURE TO FOLLOW THESE INSTRUCTIONS MAY RESULT IN THE CANCELLATION OF YOUR SURGERY PATIENT SIGNATURE_________________________________  NURSE SIGNATURE__________________________________  ________________________________________________________________________    CLEAR LIQUID DIET   Foods Allowed                                                                     Foods  Excluded  Coffee and tea, regular and decaf                             liquids that you cannot  Plain Jell-O in any flavor                                             see through such as: Fruit ices (not with fruit pulp)                                     milk, soups, orange juice  Iced Popsicles                                    All solid food                               Cranberry, grape and apple juices Sports drinks like Gatorade Lightly seasoned clear broth or consume(fat free) Sugar, honey syrup  Sample Menu Breakfast  Lunch                                     Supper Cranberry juice                    Beef broth                            Chicken broth Jell-O                                     Grape juice                           Apple juice Coffee or tea                        Jell-O                                      Popsicle                                                Coffee or tea                        Coffee or tea  _____________________________________________________________________    Incentive Spirometer  An incentive spirometer is a tool that can help keep your lungs clear and active. This tool measures how well you are filling your lungs with each breath. Taking long deep breaths may help reverse or decrease the chance of developing breathing (pulmonary) problems (especially infection) following:  A long period of time when you are unable to move or be active. BEFORE THE PROCEDURE   If the spirometer includes an indicator to show your best effort, your nurse or respiratory therapist will set it to a desired goal.  If possible, sit up straight or lean slightly forward. Try not to slouch.  Hold the incentive spirometer in an upright position. INSTRUCTIONS FOR USE   Sit on the edge of your bed if possible, or sit up as far as you can in bed or on a chair.  Hold the incentive spirometer in an upright position.  Breathe  out normally.  Place the mouthpiece in your mouth and seal your lips tightly around it.  Breathe in slowly and as deeply as possible, raising the piston or the ball toward the top of the column.  Hold your breath for 3-5 seconds or for as long as possible. Allow the piston or ball to fall to the bottom of the column.  Remove the mouthpiece from your mouth and breathe out normally.  Rest for a few seconds and repeat Steps 1 through 7 at least 10 times every 1-2 hours when you are awake. Take your time and take a few normal breaths between deep breaths.  The spirometer may include an indicator to show your best effort. Use the indicator as a goal to work toward during each repetition.  After each set of 10 deep breaths,  practice coughing to be sure your lungs are clear. If you have an incision (the cut made at the time of surgery), support your incision when coughing by placing a pillow or rolled up towels firmly against it. Once you are able to get out of bed, walk around indoors and cough well. You may stop using the incentive spirometer when instructed by your caregiver.  RISKS AND COMPLICATIONS  Take your time so you do not get dizzy or light-headed.  If you are in pain, you may need to take or ask for pain medication before doing incentive spirometry. It is harder to take a deep breath if you are having pain. AFTER USE  Rest and breathe slowly and easily.  It can be helpful to keep track of a log of your progress. Your caregiver can provide you with a simple table to help with this. If you are using the spirometer at home, follow these instructions: [place] IF:   You are having difficultly using the spirometer.  You have trouble using the spirometer as often as instructed.  Your pain medication is not giving enough relief while using the spirometer.  You develop fever of 100.5 F (38.1 C) or higher. SEEK IMMEDIATE MEDICAL CARE IF:   You cough up bloody sputum that  had not been present before.  You develop fever of 102 F (38.9 C) or greater.  You develop worsening pain at or near the incision site. MAKE SURE YOU:   Understand these instructions.  Will watch your condition.  Will get help right away if you are not doing well or get worse. Document Released: 01/16/2007 Document Revised: 11/28/2011 Document Reviewed: 03/19/2007 ExitCare Patient Information 2014 [employer], [place].   ________________________________________________________________________  WHAT IS A BLOOD TRANSFUSION? Blood Transfusion Information  A transfusion is the replacement of blood or some of its parts. Blood is made up of multiple cells which provide different functions.  Red blood cells carry oxygen and are used for blood loss replacement.  White blood cells fight against infection.  Platelets control bleeding.  Plasma helps clot blood.  Other blood products are available for specialized needs, such as hemophilia or other clotting disorders. BEFORE THE TRANSFUSION  Who gives blood for transfusions?   Healthy volunteers who are fully evaluated to make sure their blood is safe. This is blood bank blood. Transfusion therapy is the safest it has ever been in the practice of medicine. Before blood is taken from a donor, a complete history is taken to make sure that person has no history of diseases nor engages in risky social behavior (examples are intravenous drug use or sexual activity with multiple partners). The donor's travel history is screened to minimize risk of transmitting infections, such as malaria. The donated blood is tested for signs of infectious diseases, such as HIV and hepatitis. The blood is then tested to be sure it is compatible with you in order to minimize the chance of a transfusion reaction. If you or a relative donates blood, this is often done in anticipation of surgery and is not appropriate for emergency situations. It takes many days to process  the donated blood. RISKS AND COMPLICATIONS Although transfusion therapy is very safe and saves many lives, the main dangers of transfusion include:   Getting an infectious disease.  Developing a transfusion reaction. This is an allergic reaction to something in the blood you were given. Every precaution is taken to prevent this. The decision to have a blood transfusion has been  considered carefully by your caregiver before blood is given. Blood is not given unless the benefits outweigh the risks. AFTER THE TRANSFUSION  Right after receiving a blood transfusion, you will usually feel much better and more energetic. This is especially true if your red blood cells have gotten low (anemic). The transfusion raises the level of the red blood cells which carry oxygen, and this usually causes an energy increase.  The nurse administering the transfusion will monitor you carefully for complications. HOME CARE INSTRUCTIONS  No special instructions are needed after a transfusion. You may find your energy is better. Speak with your caregiver about any limitations on activity for underlying diseases you may have. SEEK MEDICAL CARE IF:   Your condition is not improving after your transfusion.  You develop redness or irritation at the intravenous (IV) site. SEEK IMMEDIATE MEDICAL CARE IF:  Any of the following symptoms occur over the next 12 hours:  Shaking chills.  You have a temperature by mouth above 102 F (38.9 C), not controlled by medicine.  Chest, back, or muscle pain.  People around you feel you are not acting correctly or are confused.  Shortness of breath or difficulty breathing.  Dizziness and fainting.  You get a rash or develop hives.  You have a decrease in urine output.  Your urine turns a dark color or changes to pink, red, or brown. Any of the following symptoms occur over the next 10 days:  You have a temperature by mouth above 102 F (38.9 C), not controlled by  medicine.  Shortness of breath.  Weakness after normal activity.  The white part of the eye turns yellow (jaundice).  You have a decrease in the amount of urine or are urinating less often.  Your urine turns a dark color or changes to pink, red, or brown. Document Released: 09/02/2000 Document Revised: 11/28/2011 Document Reviewed: 04/21/2008 [facility] Patient Information 2014 [place], [place].  _______________________________________________________________________

## 2015-07-24 NOTE — Progress Notes (Addendum)
CBCD and CMP results per epic per PAT visit 07/24/2015 sent to Dr Denman George; also spoke with Joylene John NP in regards to CBCD results.

## 2015-07-24 NOTE — Progress Notes (Signed)
CT Chest epic 07/20/2015

## 2015-07-24 NOTE — Progress Notes (Signed)
Spoke with pts son Jeneen Rinks in regards to pt needing to be at Short Stay on 07/28/2015 at 7:00 am. Pts son verbalized understanding.

## 2015-07-27 ENCOUNTER — Telehealth: Payer: Self-pay | Admitting: Gynecologic Oncology

## 2015-07-27 NOTE — Telephone Encounter (Signed)
Left message asking the patient to please call the office to discuss pre-surgical plans including blood transfusion.  Advised to potential for blood transfusion and given information about pre-op appt with Dr. Denman George.

## 2015-07-28 ENCOUNTER — Inpatient Hospital Stay (HOSPITAL_COMMUNITY): Payer: Medicare Other | Admitting: Anesthesiology

## 2015-07-28 ENCOUNTER — Encounter (HOSPITAL_COMMUNITY)
Admission: RE | Disposition: A | Discharge status: Home / Self Care | Payer: Self-pay | Source: Ambulatory Visit | Attending: Gynecologic Oncology

## 2015-07-28 ENCOUNTER — Encounter (HOSPITAL_COMMUNITY): Payer: Self-pay | Admitting: Gynecologic Oncology

## 2015-07-28 ENCOUNTER — Inpatient Hospital Stay (HOSPITAL_COMMUNITY)
Admission: RE | Admit: 2015-07-28 | Discharge: 2015-08-02 | DRG: 737 | Disposition: A | Discharge status: Home / Self Care | Payer: Medicare Other | Source: Ambulatory Visit | Attending: Gynecologic Oncology | Admitting: Gynecologic Oncology

## 2015-07-28 DIAGNOSIS — C569 Malignant neoplasm of unspecified ovary: Secondary | ICD-10-CM

## 2015-07-28 DIAGNOSIS — Z01812 Encounter for preprocedural laboratory examination: Secondary | ICD-10-CM | POA: Diagnosis not present

## 2015-07-28 DIAGNOSIS — Z801 Family history of malignant neoplasm of trachea, bronchus and lung: Secondary | ICD-10-CM | POA: Diagnosis not present

## 2015-07-28 DIAGNOSIS — K9189 Other postprocedural complications and disorders of digestive system: Secondary | ICD-10-CM

## 2015-07-28 DIAGNOSIS — E861 Hypovolemia: Secondary | ICD-10-CM | POA: Diagnosis present

## 2015-07-28 DIAGNOSIS — R34 Anuria and oliguria: Secondary | ICD-10-CM | POA: Diagnosis not present

## 2015-07-28 DIAGNOSIS — D6481 Anemia due to antineoplastic chemotherapy: Secondary | ICD-10-CM | POA: Diagnosis present

## 2015-07-28 DIAGNOSIS — K567 Ileus, unspecified: Secondary | ICD-10-CM | POA: Diagnosis not present

## 2015-07-28 DIAGNOSIS — Z9221 Personal history of antineoplastic chemotherapy: Secondary | ICD-10-CM

## 2015-07-28 DIAGNOSIS — C786 Secondary malignant neoplasm of retroperitoneum and peritoneum: Secondary | ICD-10-CM | POA: Diagnosis present

## 2015-07-28 DIAGNOSIS — E782 Mixed hyperlipidemia: Secondary | ICD-10-CM | POA: Diagnosis present

## 2015-07-28 DIAGNOSIS — R188 Other ascites: Secondary | ICD-10-CM | POA: Diagnosis present

## 2015-07-28 DIAGNOSIS — I10 Essential (primary) hypertension: Secondary | ICD-10-CM | POA: Diagnosis present

## 2015-07-28 DIAGNOSIS — Z8249 Family history of ischemic heart disease and other diseases of the circulatory system: Secondary | ICD-10-CM

## 2015-07-28 DIAGNOSIS — C562 Malignant neoplasm of left ovary: Secondary | ICD-10-CM | POA: Diagnosis present

## 2015-07-28 HISTORY — PX: DEBULKING: SHX6277

## 2015-07-28 HISTORY — PX: LAPAROTOMY: SHX154

## 2015-07-28 HISTORY — PX: ABDOMINAL HYSTERECTOMY: SHX81

## 2015-07-28 HISTORY — PX: OMENTECTOMY: SHX5985

## 2015-07-28 LAB — PREPARE RBC (CROSSMATCH)

## 2015-07-28 LAB — ABO/RH: ABO/RH(D): A POS

## 2015-07-28 SURGERY — LAPAROTOMY, EXPLORATORY
Anesthesia: General

## 2015-07-28 MED ORDER — MIRTAZAPINE 7.5 MG PO TABS
7.5000 mg | ORAL_TABLET | Freq: Every day | ORAL | Status: DC
  Administered 2015-07-28 – 2015-08-01 (×5): 7.5 mg via ORAL
  Filled 2015-07-28 (×6): qty 1

## 2015-07-28 MED ORDER — SULFASALAZINE 500 MG PO TABS
500.0000 mg | ORAL_TABLET | Freq: Three times a day (TID) | ORAL | Status: DC
  Administered 2015-07-28 – 2015-08-02 (×13): 500 mg via ORAL
  Filled 2015-07-28 (×16): qty 1

## 2015-07-28 MED ORDER — PROPOFOL 10 MG/ML IV BOLUS
INTRAVENOUS | Status: AC
  Filled 2015-07-28: qty 20

## 2015-07-28 MED ORDER — ONDANSETRON HCL 4 MG/2ML IJ SOLN
INTRAMUSCULAR | Status: DC | PRN
  Administered 2015-07-28: 4 mg via INTRAVENOUS

## 2015-07-28 MED ORDER — AMLODIPINE BESYLATE 10 MG PO TABS
10.0000 mg | ORAL_TABLET | Freq: Every evening | ORAL | Status: DC
  Administered 2015-07-28 – 2015-08-01 (×5): 10 mg via ORAL
  Filled 2015-07-28 (×6): qty 1

## 2015-07-28 MED ORDER — ENOXAPARIN SODIUM 40 MG/0.4ML ~~LOC~~ SOLN
40.0000 mg | SUBCUTANEOUS | Status: DC
  Administered 2015-07-29 – 2015-08-02 (×5): 40 mg via SUBCUTANEOUS
  Filled 2015-07-28 (×6): qty 0.4

## 2015-07-28 MED ORDER — ACETAMINOPHEN 500 MG PO TABS
1000.0000 mg | ORAL_TABLET | Freq: Two times a day (BID) | ORAL | Status: DC
  Administered 2015-07-28 – 2015-08-02 (×9): 1000 mg via ORAL
  Filled 2015-07-28 (×14): qty 2

## 2015-07-28 MED ORDER — SUGAMMADEX SODIUM 200 MG/2ML IV SOLN
INTRAVENOUS | Status: AC
  Filled 2015-07-28: qty 2

## 2015-07-28 MED ORDER — HYDROMORPHONE HCL 1 MG/ML IJ SOLN
0.5000 mg | INTRAMUSCULAR | Status: DC | PRN
Start: 2015-07-28 — End: 2015-08-02

## 2015-07-28 MED ORDER — ROCURONIUM BROMIDE 100 MG/10ML IV SOLN
INTRAVENOUS | Status: DC | PRN
  Administered 2015-07-28: 30 mg via INTRAVENOUS
  Administered 2015-07-28 (×3): 10 mg via INTRAVENOUS

## 2015-07-28 MED ORDER — CEFAZOLIN SODIUM-DEXTROSE 2-3 GM-% IV SOLR
2.0000 g | INTRAVENOUS | Status: AC
  Administered 2015-07-28: 2 g via INTRAVENOUS

## 2015-07-28 MED ORDER — FENTANYL CITRATE (PF) 100 MCG/2ML IJ SOLN
INTRAMUSCULAR | Status: DC | PRN
  Administered 2015-07-28 (×5): 50 ug via INTRAVENOUS

## 2015-07-28 MED ORDER — ENSURE ENLIVE PO LIQD
237.0000 mL | Freq: Two times a day (BID) | ORAL | Status: DC
  Administered 2015-07-28 – 2015-08-02 (×6): 237 mL via ORAL

## 2015-07-28 MED ORDER — LIDOCAINE HCL (CARDIAC) 20 MG/ML IV SOLN
INTRAVENOUS | Status: AC
  Filled 2015-07-28: qty 5

## 2015-07-28 MED ORDER — PHENYLEPHRINE 40 MCG/ML (10ML) SYRINGE FOR IV PUSH (FOR BLOOD PRESSURE SUPPORT)
PREFILLED_SYRINGE | INTRAVENOUS | Status: AC
  Filled 2015-07-28: qty 10

## 2015-07-28 MED ORDER — KCL IN DEXTROSE-NACL 20-5-0.45 MEQ/L-%-% IV SOLN
INTRAVENOUS | Status: DC
  Administered 2015-07-28: 18:00:00 via INTRAVENOUS
  Filled 2015-07-28 (×2): qty 1000

## 2015-07-28 MED ORDER — ENOXAPARIN SODIUM 40 MG/0.4ML ~~LOC~~ SOLN
40.0000 mg | SUBCUTANEOUS | Status: AC
  Administered 2015-07-28: 40 mg via SUBCUTANEOUS
  Filled 2015-07-28: qty 0.4

## 2015-07-28 MED ORDER — PROPOFOL 10 MG/ML IV BOLUS
INTRAVENOUS | Status: DC | PRN
  Administered 2015-07-28: 150 mg via INTRAVENOUS

## 2015-07-28 MED ORDER — PRAVASTATIN SODIUM 10 MG PO TABS
10.0000 mg | ORAL_TABLET | Freq: Every day | ORAL | Status: DC
  Administered 2015-07-28 – 2015-08-01 (×5): 10 mg via ORAL
  Filled 2015-07-28 (×6): qty 1

## 2015-07-28 MED ORDER — ASPIRIN EC 81 MG PO TBEC
81.0000 mg | DELAYED_RELEASE_TABLET | Freq: Every day | ORAL | Status: DC
  Administered 2015-07-28 – 2015-08-02 (×5): 81 mg via ORAL
  Filled 2015-07-28 (×6): qty 1

## 2015-07-28 MED ORDER — PHENYLEPHRINE HCL 10 MG/ML IJ SOLN
INTRAMUSCULAR | Status: DC | PRN
  Administered 2015-07-28 (×4): 80 ug via INTRAVENOUS

## 2015-07-28 MED ORDER — HYDROMORPHONE HCL 1 MG/ML IJ SOLN
INTRAMUSCULAR | Status: AC
  Filled 2015-07-28: qty 1

## 2015-07-28 MED ORDER — FENTANYL CITRATE (PF) 250 MCG/5ML IJ SOLN
INTRAMUSCULAR | Status: AC
  Filled 2015-07-28: qty 25

## 2015-07-28 MED ORDER — ONDANSETRON HCL 4 MG/2ML IJ SOLN
INTRAMUSCULAR | Status: AC
  Filled 2015-07-28: qty 2

## 2015-07-28 MED ORDER — ONDANSETRON HCL 4 MG PO TABS: 4.0000 mg | ORAL_TABLET | Freq: Four times a day (QID) | ORAL | Status: DC | PRN

## 2015-07-28 MED ORDER — 0.9 % SODIUM CHLORIDE (POUR BTL) OPTIME
TOPICAL | Status: DC | PRN
  Administered 2015-07-28: 3000 mL

## 2015-07-28 MED ORDER — MAGNESIUM HYDROXIDE 400 MG/5ML PO SUSP
30.0000 mL | Freq: Three times a day (TID) | ORAL | Status: AC
  Administered 2015-07-28 – 2015-07-29 (×3): 30 mL via ORAL
  Filled 2015-07-28 (×3): qty 30

## 2015-07-28 MED ORDER — SODIUM CHLORIDE 0.9 % IV SOLN
INTRAVENOUS | Status: DC | PRN
  Administered 2015-07-28 (×2): via INTRAVENOUS

## 2015-07-28 MED ORDER — OXYCODONE HCL 5 MG PO TABS
5.0000 mg | ORAL_TABLET | ORAL | Status: DC | PRN
  Filled 2015-07-28: qty 1

## 2015-07-28 MED ORDER — DEXAMETHASONE SODIUM PHOSPHATE 10 MG/ML IJ SOLN
INTRAMUSCULAR | Status: AC
  Filled 2015-07-28: qty 1

## 2015-07-28 MED ORDER — LIDOCAINE HCL (CARDIAC) 20 MG/ML IV SOLN
INTRAVENOUS | Status: DC | PRN
  Administered 2015-07-28: 100 mg via INTRAVENOUS

## 2015-07-28 MED ORDER — ONDANSETRON HCL 4 MG/2ML IJ SOLN
4.0000 mg | Freq: Four times a day (QID) | INTRAMUSCULAR | Status: DC | PRN
  Administered 2015-07-30 – 2015-07-31 (×3): 4 mg via INTRAVENOUS
  Filled 2015-07-28 (×4): qty 2

## 2015-07-28 MED ORDER — QUETIAPINE FUMARATE ER 300 MG PO TB24
300.0000 mg | ORAL_TABLET | Freq: Every day | ORAL | Status: DC
  Administered 2015-07-28 – 2015-08-01 (×5): 300 mg via ORAL
  Filled 2015-07-28 (×6): qty 1

## 2015-07-28 MED ORDER — GABAPENTIN 300 MG PO CAPS
600.0000 mg | ORAL_CAPSULE | Freq: Every day | ORAL | Status: AC
  Administered 2015-07-28: 600 mg via ORAL
  Filled 2015-07-28: qty 2

## 2015-07-28 MED ORDER — HYDROMORPHONE HCL 1 MG/ML IJ SOLN
0.2500 mg | INTRAMUSCULAR | Status: DC | PRN
  Administered 2015-07-28 (×2): 0.5 mg via INTRAVENOUS

## 2015-07-28 MED ORDER — SODIUM CHLORIDE 0.9 % IJ SOLN
INTRAMUSCULAR | Status: AC
  Filled 2015-07-28: qty 20

## 2015-07-28 MED ORDER — ROCURONIUM BROMIDE 100 MG/10ML IV SOLN
INTRAVENOUS | Status: AC
  Filled 2015-07-28: qty 1

## 2015-07-28 MED ORDER — LACTATED RINGERS IV SOLN
INTRAVENOUS | Status: DC
  Administered 2015-07-28: 15:00:00 via INTRAVENOUS
  Administered 2015-07-28: 1000 mL via INTRAVENOUS
  Administered 2015-07-28: 16:00:00 via INTRAVENOUS

## 2015-07-28 MED ORDER — TRAMADOL HCL 50 MG PO TABS
100.0000 mg | ORAL_TABLET | Freq: Two times a day (BID) | ORAL | Status: DC
  Administered 2015-07-28 – 2015-08-02 (×9): 100 mg via ORAL
  Filled 2015-07-28 (×9): qty 2

## 2015-07-28 MED ORDER — CEFAZOLIN SODIUM-DEXTROSE 2-3 GM-% IV SOLR
INTRAVENOUS | Status: AC
  Filled 2015-07-28: qty 50

## 2015-07-28 MED ORDER — BUPIVACAINE LIPOSOME 1.3 % IJ SUSP
20.0000 mL | Freq: Once | INTRAMUSCULAR | Status: AC
  Administered 2015-07-28: 20 mL
  Filled 2015-07-28: qty 20

## 2015-07-28 MED ORDER — SUCCINYLCHOLINE CHLORIDE 20 MG/ML IJ SOLN
INTRAMUSCULAR | Status: DC | PRN
  Administered 2015-07-28: 100 mg via INTRAVENOUS

## 2015-07-28 MED ORDER — SUGAMMADEX SODIUM 200 MG/2ML IV SOLN
INTRAVENOUS | Status: DC | PRN
  Administered 2015-07-28: 125 mg via INTRAVENOUS

## 2015-07-28 MED ORDER — SODIUM CHLORIDE 0.9 % IV SOLN
Freq: Once | INTRAVENOUS | Status: AC
  Administered 2015-07-28: 250 mL via INTRAVENOUS

## 2015-07-28 MED ORDER — METOPROLOL TARTRATE 25 MG PO TABS
25.0000 mg | ORAL_TABLET | Freq: Two times a day (BID) | ORAL | Status: DC
  Administered 2015-07-28 – 2015-08-02 (×10): 25 mg via ORAL
  Filled 2015-07-28 (×11): qty 1

## 2015-07-28 MED ORDER — DEXAMETHASONE SODIUM PHOSPHATE 10 MG/ML IJ SOLN
INTRAMUSCULAR | Status: DC | PRN
  Administered 2015-07-28: 10 mg via INTRAVENOUS

## 2015-07-28 MED ORDER — VITAMIN B-12 1000 MCG PO TABS
1000.0000 ug | ORAL_TABLET | Freq: Every day | ORAL | Status: DC
  Administered 2015-07-28 – 2015-08-02 (×5): 1000 ug via ORAL
  Filled 2015-07-28 (×6): qty 1

## 2015-07-28 SURGICAL SUPPLY — 51 items
ATTRACTOMAT 16X20 MAGNETIC DRP (DRAPES) ×2 IMPLANT
BLADE EXTENDED COATED 6.5IN (ELECTRODE) ×2 IMPLANT
CHLORAPREP W/TINT 26ML (MISCELLANEOUS) ×2 IMPLANT
CLIP TI MEDIUM LARGE 6 (CLIP) ×2 IMPLANT
CONT SPEC 4OZ CLIKSEAL STRL BL (MISCELLANEOUS) IMPLANT
COVER SURGICAL LIGHT HANDLE (MISCELLANEOUS) IMPLANT
DERMABOND ADVANCED (GAUZE/BANDAGES/DRESSINGS) ×1
DERMABOND ADVANCED .7 DNX12 (GAUZE/BANDAGES/DRESSINGS) ×1 IMPLANT
DRAPE INCISE IOBAN 66X45 STRL (DRAPES) ×2 IMPLANT
DRAPE UTILITY 15X26 (DRAPE) ×2 IMPLANT
DRAPE WARM FLUID 44X44 (DRAPE) ×2 IMPLANT
DRESSING TELFA ISLAND 4X8 (GAUZE/BANDAGES/DRESSINGS) IMPLANT
DRSG COVADERM 4X8 (GAUZE/BANDAGES/DRESSINGS) ×2 IMPLANT
DRSG OPSITE POSTOP 4X8 (GAUZE/BANDAGES/DRESSINGS) ×2 IMPLANT
ELECT LIGASURE SHORT 9 REUSE (ELECTRODE) IMPLANT
ELECT REM PT RETURN 9FT ADLT (ELECTROSURGICAL) ×2
ELECTRODE REM PT RTRN 9FT ADLT (ELECTROSURGICAL) ×1 IMPLANT
GAUZE SPONGE 4X4 12PLY STRL (GAUZE/BANDAGES/DRESSINGS) IMPLANT
GAUZE SPONGE 4X4 16PLY XRAY LF (GAUZE/BANDAGES/DRESSINGS) ×2 IMPLANT
GLOVE BIO SURGEON STRL SZ 6 (GLOVE) ×4 IMPLANT
GLOVE BIO SURGEON STRL SZ 6.5 (GLOVE) ×4 IMPLANT
GOWN STRL REUS W/ TWL LRG LVL3 (GOWN DISPOSABLE) ×2 IMPLANT
GOWN STRL REUS W/TWL LRG LVL3 (GOWN DISPOSABLE) ×2
KIT BASIN OR (CUSTOM PROCEDURE TRAY) ×2 IMPLANT
LIGASURE IMPACT 36 18CM CVD LR (INSTRUMENTS) ×2 IMPLANT
LIQUID BAND (GAUZE/BANDAGES/DRESSINGS) IMPLANT
LOOP VESSEL MAXI BLUE (MISCELLANEOUS) IMPLANT
NEEDLE HYPO 22GX1.5 SAFETY (NEEDLE) ×4 IMPLANT
NS IRRIG 1000ML POUR BTL (IV SOLUTION) IMPLANT
PACK GENERAL/GYN (CUSTOM PROCEDURE TRAY) ×2 IMPLANT
SHEET LAVH (DRAPES) ×2 IMPLANT
SPONGE LAP 18X18 X RAY DECT (DISPOSABLE) ×2 IMPLANT
STAPLER VISISTAT 35W (STAPLE) ×2 IMPLANT
SURGIFLO W/THROMBIN 8M KIT (HEMOSTASIS) ×4 IMPLANT
SUT MNCRL AB 4-0 PS2 18 (SUTURE) ×4 IMPLANT
SUT PDS AB 1 TP1 96 (SUTURE) ×4 IMPLANT
SUT VIC AB 0 CT1 36 (SUTURE) ×6 IMPLANT
SUT VIC AB 2-0 CT1 36 (SUTURE) ×4 IMPLANT
SUT VIC AB 2-0 CT2 27 (SUTURE) ×12 IMPLANT
SUT VIC AB 2-0 SH 27 (SUTURE) ×2
SUT VIC AB 2-0 SH 27X BRD (SUTURE) ×2 IMPLANT
SUT VIC AB 3-0 CTX 36 (SUTURE) IMPLANT
SUT VICRYL 2 0 18  UND BR (SUTURE) ×1
SUT VICRYL 2 0 18 UND BR (SUTURE) ×1 IMPLANT
SYR 20CC LL (SYRINGE) ×4 IMPLANT
TOWEL OR 17X26 10 PK STRL BLUE (TOWEL DISPOSABLE) ×4 IMPLANT
TOWEL OR NON WOVEN STRL DISP B (DISPOSABLE) ×2 IMPLANT
TRAY FOLEY W/METER SILVER 14FR (SET/KITS/TRAYS/PACK) ×2 IMPLANT
TRAY FOLEY W/METER SILVER 16FR (SET/KITS/TRAYS/PACK) IMPLANT
WATER STERILE IRR 1500ML POUR (IV SOLUTION) IMPLANT
YANKAUER SUCT BULB TIP NO VENT (SUCTIONS) ×2 IMPLANT

## 2015-07-28 NOTE — Anesthesia Postprocedure Evaluation (Signed)
  Anesthesia Post-op Note  Patient: Larina Bras  Procedure(s) Performed: Procedure(s) (LRB): EXPLORATORY LAPAROTOMY (N/A) TOTAL HYSTERECTOMY ABDOMINAL BILATERAL SALPINGO OOPHRORECTOMY (N/A) OMENTECTOMY  (N/A) DEBULKING (N/A)  Patient Location: PACU  Anesthesia Type: General  Level of Consciousness: awake and alert   Airway and Oxygen Therapy: Patient Spontanous Breathing  Post-op Pain: mild  Post-op Assessment: Post-op Vital signs reviewed, Patient's Cardiovascular Status Stable, Respiratory Function Stable, Patent Airway and No signs of Nausea or vomiting  Last Vitals:  Filed Vitals:   07/28/15 1700  BP: 144/69  Pulse: 81  Temp:   Resp: 15    Post-op Vital Signs: stable   Complications: No apparent anesthesia complications

## 2015-07-28 NOTE — Anesthesia Procedure Notes (Signed)
Procedure Name: Intubation Date/Time: 07/28/2015 1:42 PM Performed by: Lind Covert Pre-anesthesia Checklist: Patient identified, Emergency Drugs available, Suction available, Patient being monitored and Timeout performed Patient Re-evaluated:Patient Re-evaluated prior to inductionOxygen Delivery Method: Circle system utilized Preoxygenation: Pre-oxygenation with 100% oxygen Intubation Type: IV induction Ventilation: Mask ventilation without difficulty Laryngoscope Size: Mac and 3 Grade View: Grade I Tube type: Oral Tube size: 7.0 mm Number of attempts: 1 Airway Equipment and Method: Stylet Placement Confirmation: ETT inserted through vocal cords under direct vision,  positive ETCO2 and breath sounds checked- equal and bilateral Secured at: 21 cm Tube secured with: Tape Dental Injury: Teeth and Oropharynx as per pre-operative assessment

## 2015-07-28 NOTE — Interval H&P Note (Signed)
History and Physical Interval Note:  07/28/2015 7:14 AM  Latasha [name]  has presented today for surgery, with the diagnosis of OVARIAN CANCER  The various methods of treatment have been discussed with the patient and family. After consideration of risks, benefits and other options for treatment, the patient has consented to  Procedure(s): EXPLORATORY LAPAROTOMY (N/A) TOTAL HYSTERECTOMY ABDOMINAL BILATERAL SALPINGO OOPHRORECTOMY (N/A) OMENTECTOMY  (N/A) DEBULKING (N/A) as a surgical intervention .  The patient's history has been reviewed, patient examined, no change in status, stable for surgery.  I have reviewed the patient's chart and labs.  Questions were answered to the patient's satisfaction.  Patient has preop anemia- plan to give 2 units PRBC preop to optimize Hb and perfusion preoperatively.     Latasha [name]

## 2015-07-28 NOTE — Transfer of Care (Signed)
Immediate Anesthesia Transfer of Care Note  Patient: Latasha [name]  Procedure(s) Performed: Procedure(s): EXPLORATORY LAPAROTOMY (N/A) TOTAL HYSTERECTOMY ABDOMINAL BILATERAL SALPINGO OOPHRORECTOMY (N/A) OMENTECTOMY  (N/A) DEBULKING (N/A)  Patient Location: PACU  Anesthesia Type:General  Level of Consciousness: awake, alert  and oriented  Airway & Oxygen Therapy: Patient Spontanous Breathing and Patient connected to face mask oxygen  Post-op Assessment: Report given to RN and Post -op Vital signs reviewed and stable  Post vital signs: Reviewed and stable  Last Vitals:  Filed Vitals:   07/28/15 1625  BP:   Pulse: 77  Temp:   Resp: 14    Complications: No apparent anesthesia complications

## 2015-07-28 NOTE — Anesthesia Preprocedure Evaluation (Addendum)
Anesthesia Evaluation  Patient identified by MRN, date of birth, ID band Patient awake    Reviewed: Allergy & Precautions, NPO status , Patient's Chart, lab work & pertinent test results  Airway Mallampati: II  TM Distance: >3 FB Neck ROM: Full    Dental  (+) Edentulous Upper, Dental Advisory Given   Pulmonary neg pulmonary ROS,    Pulmonary exam normal breath sounds clear to auscultation       Cardiovascular hypertension, Pt. on medications and Pt. on home beta blockers Normal cardiovascular exam Rhythm:Regular Rate:Normal     Neuro/Psych PSYCHIATRIC DISORDERS Schizophrenia negative neurological ROS     GI/Hepatic Neg liver ROS, PUD,   Endo/Other  negative endocrine ROS  Renal/GU Renal InsufficiencyRenal diseaseCr 1.3 K 4.1  negative genitourinary   Musculoskeletal  (+) Arthritis ,   Abdominal   Peds negative pediatric ROS (+)  Hematology  (+) anemia , Last Hgb 7.3   Anesthesia Other Findings   Reproductive/Obstetrics negative OB ROS                           Anesthesia Physical Anesthesia Plan  ASA: III  Anesthesia Plan: General   Post-op Pain Management:    Induction: Intravenous  Airway Management Planned: Oral ETT  Additional Equipment:   Intra-op Plan:   Post-operative Plan: Extubation in OR  Informed Consent: I have reviewed the patients History and Physical, chart, labs and discussed the procedure including the risks, benefits and alternatives for the proposed anesthesia with the patient or authorized representative who has indicated his/her understanding and acceptance.   Dental advisory given  Plan Discussed with: CRNA  Anesthesia Plan Comments:         Anesthesia Quick Evaluation

## 2015-07-28 NOTE — Op Note (Signed)
OPERATIVE NOTE 07/28/15  Preoperative Diagnosis: 1. 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 Exploratory laparotomy with total abdominal hysterectomy, bilateral salpingo-oophorectomy, omentectomy radical tumor debulking for ovarian cancer .  Surgeon: Thereasa Solo, MD.  Assistant Surgeon: Dr Lahoma Crocker, M.D. Assistant: (an MD assistant was necessary for tissue manipulation, retraction and positioning due to the complexity of the case and hospital policies).   Specimens: Uterus, bilateral tubes / ovaries, omentum. Cul de sac tumor   Estimated Blood Loss: 300 mL.    Urine [id number]:160FU  Complications: None.   Operative Findings: Excellent response to neoadjuvant chemotherapy. Small volume plaques/nodules in left and right posterior cul de sac behind uterus, tumor on left ovary (3cm), multiple 2cm nodules in omentum. No other peritoneal disease. Diaphragm normal.   This represented an optimal cytoreduction (R0) with no gross visible disease remaining.   Procedure:   The patient was seen in the Holding Room. The risks, benefits, complications, treatment options, and expected outcomes were discussed with the patient.  The patient concurred with the proposed plan, giving informed consent.   The patient was  identified as Latasha [name]  and the procedure verified as BSO, omentectomy, tumor debulking. A Time Out was held and the above information confirmed upon entry to the operating room..  After induction of anesthesia, the patient was draped and prepped in the usual sterile manner.  She was prepped and draped in the normal sterile fashion in the dorsal lithotomy position in padded Allen stirrups with good attention paid to support of the lower back and lower extremities. Position was adjusted for appropriate support. A Foley catheter was placed to gravity.   A midline vertical incision was made and carried through  the subcutaneous tissue to the fascia. The fascial incision was made and extended superiorally. The rectus muscles were separated. The peritoneum was identified and entered. Peritoneal incision was extended longitudinally.  The abdominal cavity was entered sharply and without incident. A Bookwalter retractor was then placed. A survey of the abdomen and pelvis revealed the above findings, which were significant for tumor nodules on the left ovary, and pelvic cul de sac and omentum.  After packing the small bowel into the upper abdomen, we performed the hysterectomy and bilateral salpingo-oophorectomy by entering the  Right pelvic sidewall just posterior to the right round ligament. The pararectal space was developed and the retroperitoneum developed up to the level of the common iliac artery.  The course of the ureter was identified with ease. The right IP was then skeletonized, and sealed with bipolar ligasure. The ovary was separated from its peritoneal attachments with the bovie with visualization of the ureter at all times. The anterior peritoneal incision was made with the bovie and the bladder flap was created with delicate dissection to free the bladder from the cervix and upper vagina.   The sigmoid colon was dissected from its dense tumor attachments to the left ovary using sharp dissection. The left retroperitoneal peritoneum was entered parallel to the sigmoid colon attachments and the left ureter was identified in the left retroperitoneal space. Using sharp and monopolar dissection, the left tube and ovary were freed from their peritoneal adhesions to the pelvis and sigmoid colon. The IP ligament was sealed with ligasure bipolar and transected. The anterior peritoneal attachments/bladder flap was completed on the left. The skeletonized uterine vessels bilaterally at the uterine isthmus were clamped and transected and suture ligated. Straight haeney clamps were used down  the cardinal ligaments to  clamp, transect and suture ligature these pedicles. Curved clamps were passed across the upper vagina after adequate mobililzation of the bladder inferiorally. The uterus was transected sharply from the vagina. The vaginal cuff was closed with figure of 8 0-vicryl suture. Irrigation in the pelvic cavity took place.  Tumor plaques and nodules on the left and right pelvic cul de sac were sharply dissected using metzenbaum scissors to free them from the rectal mesentery. Hemostasis and an intact rectal wall was observed. Surgiflow was applied to this area to reinforce hemostasis.  The omentum was dissected free from the transverse colon from the hepatic flexure to the splenic flexure using sharp metzenbaum scissor dissection. The lesser sac was entered. The tumor nodules were separated from the mesentery of the transverse colon. The short gastric vessels were sealed with ligasure and the infragastric omentum was separated from the greater curvature of the stomach removing all bulky tumor. Hemostasis was confirmed. The colon was closely inspected and was noted to be intact and hemostatic.  The peritoneal cavity was irrigated and hemostasis was confirmed at all surgical sites.  No visible residual tumor was present at the completion of the procedure.   The fascia was reapproximated with 0 looped PDS using a total of two sutures. The subcutaneous layer was then irrigated copiously.  Exparel long acting local anesthetic was infiltrated into the subcutaneous tissues. The skin was closed with staples. The patient tolerated the procedure well.   Sponge, lap and needle counts were correct x 2.   [name], MD

## 2015-07-28 NOTE — H&P (View-Only) (Signed)
Followup Note: Gyn-Onc  Latasha [name] 74 y.o. 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 peritoneal carcinomatosis    follow up    Assessment/Plan:  [name]  is a 74 y.o.  year old with stage IVB ovarian cancer, with peritoneal carcinomatosis and pulmonary nodal metastases (clinical) on chest CT, confirmed on cytology of the peritoneal ascites.  She has had an excellent response (radiographically and serologically) to her first 3 cycles of chemotherapy.  She has persistent peritoneal implants including in the left upper quadrant omentum.  On pelvic exam she has no persistent nodularity in the RV cul de sac. She no longer has chest disease measurable on CT imaging.  I am recommending interval debulking on 07/28/15. This will be an ex lap, TAH, BSO, omentectomy, debulking to <1cm3 of residual disease.  I discussed surgical complications including  bleeding, infection, damage to internal organs (such as bladder,ureters, bowels), blood clot, reoperation and rehospitalization.  I discussed anticipated length of stay postop (3-5 days).  She will need additional chemotherapy (3 cycles minimum) after debulking.   HPI: Latasha [name] is a very pleasant 74 year old G1 who is seen in consultation at the request of Dr Latasha [name] for (clinical) stage IVB ovarian cancer. The patient developed symptoms of progressive abdominal fullness and discomfort over the course of several months. In August, 2016 she presented to the [facility] where imaging of the abdomen and pelvis was obtained for diarrhea, nausea and emesis. Imaging on 04/25/15 revealed large volume ascites, carcinomatosis and peritoneal masses measuring up to 5.1cm. The patient underwent paracentesis with cytology on 04/27/15 which revealed metastatic adenocarcinoma consistent on immunostains with gyn primary.  CT of the chest on 04/27/15 revealed mild left  supraclavicular lymphadenopathy, and mediastinal lymphadenopathy consistent with metastatic disease. There were <72m pulmonary nodules also seen which were indeterminant. There were trace pleural effusions seen.   CA 125 on 04/25/15: 3902.  She then went on to receive 3 cycles of paclitaxel and carboplatin chemotherapy with Dr PWhitney [name] Cycle 3 was on 07/01/15.  CA 125 on 07/01/15 was 249.  CT scan on 07/20/15 showed: Previously noted ascites has resolved. No pneumoperitoneum. Many of the previously noted peritoneal implants are no longer confidently identified on today's examination. There are few smaller implants noted, the largest of which is in th  left side of the abdomen anteriorly measuring 9 x 11 mm. There is also a 10 x 7 mm lesion superficial to the splenic flexure of the colon which is substantially smaller than the prior study (previously 2.8 x 1.7 cm).  There was resolution of the chest adenopathy.  She has tolerated chemotherapy well with a good appetites and good general function.   Current Meds:  Outpatient Encounter Prescriptions as of 07/20/2015  Medication Sig  . amLODipine (NORVASC) 10 MG tablet Take 10 mg by mouth daily.  .Marland Kitchenaspirin EC 81 MG tablet Take 81 mg by mouth daily.  .Marland KitchenCARBOPLATIN IV Inject into the vein every 21 ( twenty-one) days. To start 05/13/15  . Cholecalciferol (VITAMIN D-3) 1000 UNITS CAPS Take 1,000 Units by mouth daily.   .Marland Kitchendexamethasone (DECADRON) 4 MG tablet The day before chemo take 5 tablets (290m in the am and 5 tablets (2064min the pm. The morning of chemo take 5 tablets (75m104m . ferrous sulfate 325 (65 FE) MG EC tablet Take 325 mg by mouth 2 (two) times daily.  .Marland Kitchen  folic acid (FOLVITE) 1 MG tablet Take 1 mg by mouth daily.  Marland Kitchen lidocaine-prilocaine (EMLA) cream Apply a quarter size amount to port site 1 hour prior to chemo. Do not rub in. Cover with plastic wrap.  . lovastatin (MEVACOR) 10 MG tablet Take 10 mg by mouth at bedtime.  . metoprolol  tartrate (LOPRESSOR) 25 MG tablet Take 1 tablet (25 mg total) by mouth 2 (two) times daily.  . mirtazapine (REMERON) 15 MG tablet Take 7.5 mg by mouth at bedtime.  . Multiple Vitamin (MULTIVITAMIN WITH MINERALS) TABS tablet Take 1 tablet by mouth daily.  . ondansetron (ZOFRAN) 8 MG tablet Take 1 tablet (8 mg total) by mouth every 8 (eight) hours as needed for nausea or vomiting.  Marland Kitchen PACLitaxel (TAXOL IV) Inject into the vein every 21 ( twenty-one) days. To start 05/13/15  . Pegfilgrastim (NEULASTA ONPRO [place]) Inject into the skin every 21 ( twenty-one) days. To be applied to skin at the end of chemo.  . prochlorperazine (COMPAZINE) 10 MG tablet Take 1 tablet (10 mg total) by mouth every 6 (six) hours as needed (Nausea or vomiting).  . QUEtiapine (SEROQUEL XR) 300 MG 24 hr tablet Take 300 mg by mouth at bedtime.  . sulfaSALAzine (AZULFIDINE) 500 MG tablet Take 500 mg by mouth 3 (three) times daily.  . traMADol (ULTRAM) 50 MG tablet Take 1 tablet (50 mg total) by mouth every 6 (six) hours as needed.  . vitamin B-12 (CYANOCOBALAMIN) 1000 MCG tablet Take 1,000 mcg by mouth daily.   No facility-administered encounter medications on file as of 07/20/2015.    Allergy: No Known Allergies  Social Hx:   Social History   Social History  . Marital Status: Single    Spouse Name: N/A  . Number of Children: N/A  . Years of Education: N/A   Occupational History  . Not on file.   Social History Main Topics  . Smoking status: Never Smoker   . Smokeless tobacco: Never Used  . Alcohol Use: No  . Drug Use: No  . Sexual Activity: Not on file   Other Topics Concern  . Not on file   Social History Narrative    Past Surgical Hx:  Past Surgical History  Procedure Laterality Date  . Replacement total knee      right knee in 2003  . Paracentesis  04/27/15  . Portacath placement Right 04/2015    Past Medical Hx:  Past Medical History  Diagnosis Date  . Hypertension   . Schizophrenia ([place])   .  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 Ulcerative colitis ([place])   . Mixed hyperlipidemia   . Vitamin D deficiency   . B12 deficiency   . Iron deficiency anemia   . Ovarian cancer [facility])     Past Gynecological History:  SVD x 1  No LMP recorded. Patient is postmenopausal.  Family Hx:  Family History  Problem Relation Age of Onset  . Prostate cancer Brother   . Cancer Paternal Uncle   . Lung cancer Maternal Grandmother   . Hypertension Maternal Grandfather     Review of Systems:  Constitutional  Feels well,    ENT Normal appearing ears and nares bilaterally Skin/Breast  No rash, sores, jaundice, itching, dryness Cardiovascular  No chest pain, shortness of breath, or edema  Pulmonary  No cough or wheeze.  Gastro Intestinal  No nausea, vomitting, or diarrhoea. No bright red blood per rectum, no abdominal pain, change in bowel movement, or constipation.  Genito Urinary  No frequency, urgency, dysuria, no postmenopausal bleeding Musculo Skeletal  No myalgia, arthralgia, joint swelling or pain  Neurologic  No weakness, numbness, change in gait,  Psychology  No depression, anxiety, insomnia.   Vitals:  Blood pressure 134/86, pulse 82, temperature 97.8 F (36.6 C), temperature source Oral, resp. rate 18, weight 133 lb 9 oz (60.584 kg).  Physical Exam: WD in NAD Neck  Supple NROM, without any enlargements.  Lymph Node Survey No cervical supraclavicular or inguinal adenopathy Cardiovascular  Pulse normal rate, regularity and rhythm. S1 and S2 normal.  Lungs  Clear to auscultation bilateraly, without wheezes/crackles/rhonchi. Good air movement.  Skin  No rash/lesions/breakdown  Psychiatry  Alert and oriented to person, place, and time  Abdomen  Normoactive bowel sounds, abdomen soft, non distended, non-tender and thin without evidence of hernia.  Back No CVA tenderness Genito Urinary  Vulva/vagina: Normal external female genitalia.  No lesions. No discharge or  bleeding.  Bladder/urethra:  No lesions or masses, well supported bladder  Vagina: normal with some prolapse and distension from peritoneal ascites.  Cervix: Normal appearing, no lesions.  Uterus: Small, mobile, no parametrial involvement or nodularity.  Adnexa: right sided cystic mass is improved but still palpable. No RV septal nodularity appreciated. Extremities  No bilateral cyanosis, clubbing or edema.   [name], MD   07/20/2015, 2:21 PM

## 2015-07-29 LAB — BASIC METABOLIC PANEL
Anion gap: 5 (ref 5–15)
BUN: 13 mg/dL (ref 6–20)
CALCIUM: 9.2 mg/dL (ref 8.9–10.3)
CHLORIDE: 102 mmol/L (ref 101–111)
CO2: 31 mmol/L (ref 22–32)
CREATININE: 1.08 mg/dL — AB (ref 0.44–1.00)
GFR calc Af Amer: 58 mL/min — ABNORMAL LOW (ref >60)
GFR calc non Af Amer: 50 mL/min — ABNORMAL LOW (ref >60)
GLUCOSE: 143 mg/dL — AB (ref 65–99)
Potassium: 5.5 mmol/L — ABNORMAL HIGH (ref 3.5–5.1)
Sodium: 138 mmol/L (ref 135–145)

## 2015-07-29 LAB — TYPE AND SCREEN
ABO/RH(D): A POS
Antibody Screen: NEGATIVE
UNIT DIVISION: 0
Unit division: 0

## 2015-07-29 LAB — CBC
HEMATOCRIT: 31.1 % — AB (ref 36.0–46.0)
HEMOGLOBIN: 10.3 g/dL — AB (ref 12.0–15.0)
MCH: 31.4 pg (ref 26.0–34.0)
MCHC: 33.1 g/dL (ref 30.0–36.0)
MCV: 94.8 fL (ref 78.0–100.0)
Platelets: 283 10*3/uL (ref 150–400)
RBC: 3.28 MIL/uL — ABNORMAL LOW (ref 3.87–5.11)
RDW: 22.4 % — AB (ref 11.5–15.5)
WBC: 20.9 10*3/uL — ABNORMAL HIGH (ref 4.0–10.5)

## 2015-07-29 MED ORDER — ENOXAPARIN (LOVENOX) PATIENT EDUCATION KIT
PACK | Freq: Once | Status: AC
  Administered 2015-07-29: 11:00:00
  Filled 2015-07-29: qty 1

## 2015-07-29 NOTE — Progress Notes (Signed)
1 Day Post-Op Procedure(s) (LRB): EXPLORATORY LAPAROTOMY (N/A) TOTAL HYSTERECTOMY ABDOMINAL BILATERAL SALPINGO OOPHRORECTOMY (N/A) OMENTECTOMY  (N/A) DEBULKING (N/A)  Subjective: Patient reports feeling well this am.  Reporting no pain at this time.  Tolerated breakfast this am with no nausea or emesis reported.  Up with assistance.  Foley removed this am.  Denies chest pain, dyspnea, passing flatus, or having a bowel movement.  No concerns voiced.    Objective: Vital signs in last 24 hours: Temp:  [97.5 F (36.4 C)-98.6 F (37 C)] 98.3 F (36.8 C) (11/09 0953) Pulse Rate:  [76-91] 86 (11/09 0953) Resp:  [11-18] 18 (11/09 0953) BP: (109-158)/(45-77) 109/54 mmHg (11/09 0953) SpO2:  [97 %-100 %] 97 % (11/09 0953) Last BM Date: 07/28/15  Intake/Output from previous day: 11/08 0701 - 11/09 0700 In: 4498.3 [P.O.:480; I.V.:3288.3; Blood:730] Out: 1550 [Urine:1250; Blood:300]  Physical Examination: General: alert, cooperative and no distress Resp: clear to auscultation bilaterally Cardio: regular rate and rhythm, S1, S2 normal, no murmur, click, rub or gallop GI: soft, non-tender; bowel sounds normal; no masses,  no organomegaly and incision: midline incision with honeycomb dressing in place with no drainage or erythema noted Extremities: extremities normal, atraumatic, no cyanosis or edema  Labs: WBC/Hgb/Hct/Plts:  20.9/10.3/31.1/283 (11/09 5400) BUN/Cr/glu/ALT/AST/amyl/lip:  13/1.08/--/--/--/--/-- (11/09 8676)  Assessment: 74 y.o. s/p Procedure(s): EXPLORATORY LAPAROTOMY TOTAL HYSTERECTOMY ABDOMINAL BILATERAL SALPINGO OOPHRORECTOMY OMENTECTOMY  DEBULKING: stable Pain:  Pain is well-controlled on PRN medications.  Heme: Hgb 10.3 and Hct 31.1 this am.  S/P transfusion of 2 units pre-operatively due to pre-op anemia to optimize Hgb and perfusion preoperatively.  CV: BP and HR stable.  Continue to monitor with ordered vital sign assessments.  Norvasc and lopressor ordered.     GI:  Tolerating po: Yes     GU: Due to void since foley removal.  Adequate output reported.    FEN: Stable post-operatively.  K+ 5.5 this am- plan to saline lock IV  Prophylaxis: pharmacologic prophylaxis (with any of the following: enoxaparin (Lovenox) 30m SQ 2 hours prior to surgery then every day) and intermittent pneumatic compression boots.  Plan: Saline lock IV Lovenox teaching kit Encourage ambulation, IS use, deep breathing, and coughing Continue post-operative plan of care per Dr. JDelsa Sale  LOS: 1 day    CROSS, MELISSA DEAL 07/29/2015, 10:52 AM

## 2015-07-29 NOTE — Progress Notes (Signed)
Utilization review completed.  

## 2015-07-30 LAB — CBC
HEMATOCRIT: 27.1 % — AB (ref 36.0–46.0)
HEMOGLOBIN: 8.8 g/dL — AB (ref 12.0–15.0)
MCH: 31.8 pg (ref 26.0–34.0)
MCHC: 32.5 g/dL (ref 30.0–36.0)
MCV: 97.8 fL (ref 78.0–100.0)
Platelets: 260 10*3/uL (ref 150–400)
RBC: 2.77 MIL/uL — AB (ref 3.87–5.11)
RDW: 21.4 % — ABNORMAL HIGH (ref 11.5–15.5)
WBC: 12 10*3/uL — AB (ref 4.0–10.5)

## 2015-07-30 MED ORDER — BISACODYL 10 MG RE SUPP
10.0000 mg | Freq: Once | RECTAL | Status: AC
  Administered 2015-07-30: 10 mg via RECTAL
  Filled 2015-07-30: qty 1

## 2015-07-30 NOTE — Progress Notes (Signed)
2 Days Post-Op Procedure(s) (LRB): EXPLORATORY LAPAROTOMY (N/A) TOTAL HYSTERECTOMY ABDOMINAL BILATERAL SALPINGO OOPHRORECTOMY (N/A) OMENTECTOMY  (N/A) DEBULKING (N/A)  Subjective: Patient reports feeling better after her episode of emesis this am.  She also had an episode last pm.  Reporting minimal abdominal pain at this time.  Up with assistance.  Voiding without difficult.  Had BM this am with flatus.  Has not eaten food this am.  Denies chest pain, dyspnea.  No concerns voiced.    Objective: Vital signs in last 24 hours: Temp:  [98 F (36.7 C)-98.4 F (36.9 C)] 98.2 F (36.8 C) (11/10 0942) Pulse Rate:  [80-93] 93 (11/10 0942) Resp:  [18] 18 (11/10 0942) BP: (112-136)/(54-71) 133/63 mmHg (11/10 0942) SpO2:  [94 %-100 %] 94 % (11/10 0942) Last BM Date: 07/30/15  Intake/Output from previous day: 11/09 0701 - 11/10 0700 In: 1200 [P.O.:1200] Out: 1450 [Urine:1300; Emesis/NG output:150]  Physical Examination: General: alert, cooperative and no distress Resp: clear to auscultation bilaterally Cardio: regular rate and rhythm, S1, S2 normal, no murmur, click, rub or gallop GI: incision: midline incision with honeycomb dressing in place and intact, no drianage or erythema noted and abdomen slightly distended and tympanic, hypoactive bowel sounds Extremities: extremities normal, atraumatic, no cyanosis or edema  Assessment: 74 y.o. s/p Procedure(s): EXPLORATORY LAPAROTOMY TOTAL HYSTERECTOMY ABDOMINAL BILATERAL SALPINGO OOPHRORECTOMY OMENTECTOMY  DEBULKING: stable Pain:  Pain is well-controlled on PRN medications.  Heme: Hgb 10.3 and Hct 31.1 07/29/15 am.  S/P transfusion of 2 units pre-operatively due to pre-op anemia to optimize Hgb and perfusion preoperatively.  CV: BP and HR stable.  Continue to monitor with ordered vital sign assessments.  Norvasc and lopressor ordered.    GI:  Tolerating po: Nothing this am due to episode of emesis.      GU: Adequate output reported.     FEN: Stable post-operatively.    Prophylaxis: pharmacologic prophylaxis (with any of the following: enoxaparin (Lovenox) 58m SQ 2 hours prior to surgery then every day) and intermittent pneumatic compression boots.  Plan: Diet as tolerated slowly if no nausea/emesis Dulcolax suppository per Dr. JDelsa SaleLovenox teaching kit for home use Encourage ambulation, IS use, deep breathing, and coughing Continue post-operative plan of care per Dr. JDelsa Sale  LOS: 2 days    Latasha [name] 07/30/2015, 10:07 AM

## 2015-07-31 LAB — BASIC METABOLIC PANEL
Anion gap: 8 (ref 5–15)
BUN: 16 mg/dL (ref 6–20)
CO2: 32 mmol/L (ref 22–32)
Calcium: 9.5 mg/dL (ref 8.9–10.3)
Chloride: 95 mmol/L — ABNORMAL LOW (ref 101–111)
Creatinine, Ser: 1.03 mg/dL — ABNORMAL HIGH (ref 0.44–1.00)
GFR calc Af Amer: >60 mL/min (ref >60)
GFR calc non Af Amer: 53 mL/min — ABNORMAL LOW (ref >60)
Glucose, Bld: 133 mg/dL — ABNORMAL HIGH (ref 65–99)
Potassium: 4.6 mmol/L (ref 3.5–5.1)
Sodium: 135 mmol/L (ref 135–145)

## 2015-07-31 LAB — HEMOGLOBIN AND HEMATOCRIT, BLOOD
HCT: 32.4 % — ABNORMAL LOW (ref 36.0–46.0)
Hemoglobin: 10.6 g/dL — ABNORMAL LOW (ref 12.0–15.0)

## 2015-07-31 MED ORDER — LACTATED RINGERS IV BOLUS (SEPSIS)
500.0000 mL | Freq: Once | INTRAVENOUS | Status: AC
  Administered 2015-07-31: 500 mL via INTRAVENOUS

## 2015-07-31 MED ORDER — DEXTROSE-NACL 5-0.45 % IV SOLN
INTRAVENOUS | Status: DC
  Administered 2015-07-31 – 2015-08-02 (×4): via INTRAVENOUS

## 2015-07-31 MED ORDER — OXYCODONE HCL 5 MG PO TABS: 5.0000 mg | ORAL_TABLET | ORAL | Status: DC | PRN

## 2015-07-31 MED ORDER — ONDANSETRON HCL 4 MG PO TABS: 4.0000 mg | ORAL_TABLET | Freq: Four times a day (QID) | ORAL | Status: DC | PRN

## 2015-07-31 MED ORDER — ACETAMINOPHEN 500 MG PO TABS: 1000.0000 mg | ORAL_TABLET | Freq: Two times a day (BID) | ORAL | Status: AC

## 2015-07-31 MED ORDER — ENOXAPARIN SODIUM 40 MG/0.4ML ~~LOC~~ SOLN: 40.0000 mg | SUBCUTANEOUS | Status: DC

## 2015-07-31 NOTE — Care Management Important Message (Signed)
Important Message  Patient Details  Name: Latasha [name] MRN: [id number] Date of Birth: June 01, 1941   Medicare Important Message Given:  Yes    Camillo Flaming 07/31/2015, 2:01 [place] Message  Patient Details  Name: Latasha [name] MRN: [id number] Date of Birth: 06-Dec-1940   Medicare Important Message Given:  Yes    Camillo Flaming 07/31/2015, 2:01 PM

## 2015-07-31 NOTE — Progress Notes (Signed)
3 Days Post-Op Procedure(s) (LRB): EXPLORATORY LAPAROTOMY (N/A) TOTAL HYSTERECTOMY ABDOMINAL BILATERAL SALPINGO OOPHRORECTOMY (N/A) OMENTECTOMY  (N/A) DEBULKING (N/A)  Subjective: Patient had emesis this morning (small amount of ensure). Did not eat yesterday due to feeling bloated and nauseased and one episode of emesis. No flatus. No appetite. No pain.   Denies chest pain, dyspnea.  No concerns voiced.    Objective: Vital signs in last 24 hours: Temp:  [97.4 F (36.3 C)-98.8 F (37.1 C)] 97.9 F (36.6 C) (11/11 0500) Pulse Rate:  [89-98] 98 (11/11 0500) Resp:  [14-20] 14 (11/11 0500) BP: (130-153)/(63-74) 153/74 mmHg (11/11 0500) SpO2:  [94 %-100 %] 99 % (11/11 0500) Last BM Date: 07/30/15  Intake/Output from previous day: 11/10 0701 - 11/11 0700 In: 600 [P.O.:600] Out: 525 [Urine:525]   Oliguria overnight - 75cc overnight. (saline locked)  Physical Examination: General: alert, cooperative and no distress Resp: clear to auscultation bilaterally Cardio: regular rate and rhythm, S1, S2 normal, no murmur, click, rub or gallop GI: incision: midline incision with honeycomb dressing in place and intact, no drianage or erythema noted and abdomen slightly distended and tympanic, hypoactive bowel sounds Extremities: extremities normal, atraumatic, no cyanosis or edema  Assessment: 74 y.o. s/p Procedure(s): EXPLORATORY LAPAROTOMY TOTAL HYSTERECTOMY ABDOMINAL BILATERAL SALPINGO OOPHRORECTOMY OMENTECTOMY  DEBULKING: stable Pain:  Pain is well-controlled on PRN medications.  Heme: Hgb 8.8 postop (s/p preop transfusion). No clinical suspicion for ongoing losses. Will send home on FeSO4.  CV: BP and HR stable.  Continue to monitor with ordered vital sign assessments.  Norvasc and lopressor ordered.    GI:  Not tolerating po: Nothing this am due to episode of emesis.      GU: Oliguria secondary to hypovolemia. Will bolus.  FEN: Give 500cc bolus of LR, then hep lock. Prn  fluids while not tolerating PO. Recheck electrolytes today.  Prophylaxis: pharmacologic prophylaxis (with any of the following: enoxaparin (Lovenox) 40m SQ 2 hours prior to surgery then every day) and intermittent pneumatic compression boots. Home on Lovenox for 28 days postop.  Plan: Diet as tolerated slowly if no nausea/emesis Dulcolax suppository Lovenox teaching kit for home use LR bolus then hep lock Check labs for repletion needs and to reassess Hb. Encourage ambulation, IS use, deep breathing, and coughing   LOS: 3 days    RDonaciano Eva11/07/2015, 9:23 AM

## 2015-08-01 ENCOUNTER — Inpatient Hospital Stay (HOSPITAL_COMMUNITY): Payer: Medicare Other

## 2015-08-01 DIAGNOSIS — K567 Ileus, unspecified: Secondary | ICD-10-CM | POA: Diagnosis not present

## 2015-08-01 DIAGNOSIS — K9189 Other postprocedural complications and disorders of digestive system: Secondary | ICD-10-CM

## 2015-08-01 LAB — CBC WITH DIFFERENTIAL/PLATELET
BASOS ABS: 0 10*3/uL (ref 0.0–0.1)
BASOS PCT: 0 %
EOS ABS: 0.2 10*3/uL (ref 0.0–0.7)
EOS PCT: 2 %
HCT: 25.4 % — ABNORMAL LOW (ref 36.0–46.0)
Hemoglobin: 8.2 g/dL — ABNORMAL LOW (ref 12.0–15.0)
LYMPHS ABS: 1 10*3/uL (ref 0.7–4.0)
Lymphocytes Relative: 12 %
MCH: 31.1 pg (ref 26.0–34.0)
MCHC: 32.3 g/dL (ref 30.0–36.0)
MCV: 96.2 fL (ref 78.0–100.0)
Monocytes Absolute: 1 10*3/uL (ref 0.1–1.0)
Monocytes Relative: 12 %
NEUTROS PCT: 74 %
Neutro Abs: 6.1 10*3/uL (ref 1.7–7.7)
PLATELETS: 295 10*3/uL (ref 150–400)
RBC: 2.64 MIL/uL — AB (ref 3.87–5.11)
RDW: 19.5 % — ABNORMAL HIGH (ref 11.5–15.5)
WBC: 8.2 10*3/uL (ref 4.0–10.5)

## 2015-08-01 LAB — BASIC METABOLIC PANEL
Anion gap: 6 (ref 5–15)
BUN: 16 mg/dL (ref 6–20)
CO2: 30 mmol/L (ref 22–32)
Calcium: 8.6 mg/dL — ABNORMAL LOW (ref 8.9–10.3)
Chloride: 95 mmol/L — ABNORMAL LOW (ref 101–111)
Creatinine, Ser: 0.99 mg/dL (ref 0.44–1.00)
GFR, EST NON AFRICAN AMERICAN: 56 mL/min — AB (ref >60)
Glucose, Bld: 113 mg/dL — ABNORMAL HIGH (ref 65–99)
POTASSIUM: 4 mmol/L (ref 3.5–5.1)
SODIUM: 131 mmol/L — AB (ref 135–145)

## 2015-08-01 LAB — URINALYSIS, ROUTINE W REFLEX MICROSCOPIC
Glucose, UA: NEGATIVE mg/dL
Ketones, ur: NEGATIVE mg/dL
NITRITE: NEGATIVE
PH: 6 (ref 5.0–8.0)
Protein, ur: 30 mg/dL — AB
SPECIFIC GRAVITY, URINE: 1.033 — AB (ref 1.005–1.030)
Urobilinogen, UA: 1 mg/dL (ref 0.0–1.0)

## 2015-08-01 LAB — URINE MICROSCOPIC-ADD ON

## 2015-08-01 MED ORDER — BISACODYL 10 MG RE SUPP
10.0000 mg | Freq: Once | RECTAL | Status: AC
  Administered 2015-08-01: 10 mg via RECTAL
  Filled 2015-08-01: qty 1

## 2015-08-01 NOTE — Progress Notes (Signed)
Patient ID: Latasha [name], female   DOB: [birth year], 74 y.o.   MRN: [id number] 4 Days Post-Op Procedure(s) (LRB): EXPLORATORY LAPAROTOMY (N/A) TOTAL HYSTERECTOMY ABDOMINAL BILATERAL SALPINGO OOPHRORECTOMY (N/A) OMENTECTOMY  (N/A) DEBULKING (N/A)  Subjective: Small po intake today.  Denies N/V. +BM/ ?flatus. No appetite--poor appetite preop No pain.   Denies chest pain, dyspnea.      Objective: Vital signs in last 24 hours: Temp:  [97.8 F (36.6 C)-98.2 F (36.8 C)] 97.8 F (36.6 C) (11/12 1400) Pulse Rate:  [84-102] 86 (11/12 1400) Resp:  [18] 18 (11/12 1400) BP: (124-133)/(61-67) 124/65 mmHg (11/12 1400) SpO2:  [96 %-100 %] 100 % (11/12 1400) Last BM Date: 08/01/15 (pt states, " this morning".)  Intake/Output from previous day: 11/11 0701 - 11/12 0700 In: 1605 [P.O.:120; I.V.:985; IV Piggyback:500] Out: 500 [Urine:500]   CBC    Component Value Date/Time   WBC 8.2 08/01/2015 0520   RBC 2.64* 08/01/2015 0520   RBC 2.55* 06/10/2015 1015   HGB 8.2* 08/01/2015 0520   HCT 25.4* 08/01/2015 0520   PLT 295 08/01/2015 0520   MCV 96.2 08/01/2015 0520   MCH 31.1 08/01/2015 0520   MCHC 32.3 08/01/2015 0520   RDW 19.5* 08/01/2015 0520   LYMPHSABS 1.0 08/01/2015 0520   MONOABS 1.0 08/01/2015 0520   EOSABS 0.2 08/01/2015 0520   BASOSABS 0.0 08/01/2015 0520   BMET    Component Value Date/Time   NA 131* 08/01/2015 0520   K 4.0 08/01/2015 0520   CL 95* 08/01/2015 0520   CO2 30 08/01/2015 0520   GLUCOSE 113* 08/01/2015 0520   BUN 16 08/01/2015 0520   CREATININE 0.99 08/01/2015 0520   CALCIUM 8.6* 08/01/2015 0520   GFRNONAA 56* 08/01/2015 0520   GFRAA >60 08/01/2015 0520      Physical Examination: General: alert, cooperative and no distress Resp: clear to auscultation bilaterally Cardio: regular rate and rhythm, S1, S2 normal, no murmur, click, rub or gallop GI: incision: midline incision with honeycomb dressing in place and intact, no drianage or erythema noted and  abdomen slightly distended and tympanic, hypoactive bowel sounds Extremities: extremities normal, atraumatic, no cyanosis or edema  Assessment: 74 y.o. s/p Procedure(s): EXPLORATORY LAPAROTOMY TOTAL HYSTERECTOMY ABDOMINAL BILATERAL SALPINGO OOPHRORECTOMY OMENTECTOMY  DEBULKING: stable Pain:  Pain is well-controlled on PRN medications.  Heme: Hgb 8.2 postop (s/p preop transfusion). Stable.  Asymptomatic  CV: BP and HR stable.    GI:  Poor po intake--decreased appetite preop.  ?Component of a mild, postop ileus     GU: Urine out improved  FEN: Electrolytes in range.  Continue IVF.  Ensure as tolerated  Prophylaxis: pharmacologic prophylaxis (with any of the following: enoxaparin (Lovenox) 35m SQ 2 hours prior to surgery then every day) and intermittent pneumatic compression boots. Home on Lovenox for 28 days postop.  Plan: Diet as tolerated slowly if no nausea/emesis Dulcolax suppository KUB/upright U/A Repeat labs in am Daily weights Encourage ambulation, IS use, deep breathing, and coughing   LOS: 4 days    JACKSON-MOORE,Danylle Ouk A 08/01/2015, 4:38 PM

## 2015-08-01 NOTE — Progress Notes (Signed)
Pt voided only 300cc of dark amber urine this shift.  Pt up in halls x 2 w/ standby asst

## 2015-08-02 LAB — COMPREHENSIVE METABOLIC PANEL
ALK PHOS: 48 U/L (ref 38–126)
ALT: 22 U/L (ref 14–54)
ANION GAP: 5 (ref 5–15)
AST: 30 U/L (ref 15–41)
Albumin: 3.5 g/dL (ref 3.5–5.0)
BILIRUBIN TOTAL: 0.7 mg/dL (ref 0.3–1.2)
BUN: 9 mg/dL (ref 6–20)
CALCIUM: 9.3 mg/dL (ref 8.9–10.3)
CO2: 30 mmol/L (ref 22–32)
Chloride: 99 mmol/L — ABNORMAL LOW (ref 101–111)
Creatinine, Ser: 0.82 mg/dL (ref 0.44–1.00)
GLUCOSE: 110 mg/dL — AB (ref 65–99)
POTASSIUM: 3.9 mmol/L (ref 3.5–5.1)
Sodium: 134 mmol/L — ABNORMAL LOW (ref 135–145)
TOTAL PROTEIN: 6.4 g/dL — AB (ref 6.5–8.1)

## 2015-08-02 LAB — CBC
HEMATOCRIT: 28.3 % — AB (ref 36.0–46.0)
HEMOGLOBIN: 9.1 g/dL — AB (ref 12.0–15.0)
MCH: 31.6 pg (ref 26.0–34.0)
MCHC: 32.2 g/dL (ref 30.0–36.0)
MCV: 98.3 fL (ref 78.0–100.0)
Platelets: 368 10*3/uL (ref 150–400)
RBC: 2.88 MIL/uL — ABNORMAL LOW (ref 3.87–5.11)
RDW: 18.5 % — AB (ref 11.5–15.5)
WBC: 8.5 10*3/uL (ref 4.0–10.5)

## 2015-08-02 MED ORDER — MEGESTROL ACETATE 400 MG/10ML PO SUSP: 400.0000 mg | Freq: Every day | ORAL | Status: DC

## 2015-08-02 NOTE — Discharge Instructions (Signed)
07/31/2015  Return to work: 6 weeks  Activity: 1. Be up and out of the bed during the day.  Take a nap if needed.  You may walk up steps but be careful and use the hand rail.  Stair climbing will tire you more than you think, you may need to stop part way and rest.   2. No lifting or straining for 6 weeks.  3. No driving for 1 weeks.  Do Not drive if you are taking narcotic pain medicine.  4. Shower daily.  Use soap and water on your incision and pat dry; don't rub.   5. No sexual activity and nothing in the vagina for 6 weeks.  6. You will go home with lovenox injections for 28 days to reduce risk for blood clots. Diet: 1. Low sodium Heart Healthy Diet is recommended.  2. It is safe to use a laxative if you have difficulty moving your bowels.   Wound Care: 1. Keep clean and dry.  Shower daily.  Reasons to call the Doctor:   Fever - Oral temperature greater than 100.4 degrees Fahrenheit  Foul-smelling vaginal discharge  Difficulty urinating  Nausea and vomiting  Increased pain at the site of the incision that is unrelieved with pain medicine.  Difficulty breathing with or without chest pain  New calf pain especially if only on one side  Sudden, continuing increased vaginal bleeding with or without clots.   Follow-up: 1. See Everitt Amber in 2 weeks.  Contacts: For questions or concerns you should contact:  Dr. Everitt Amber at [phone]  or at [facility]    Abdominal Hysterectomy, Care After Refer to this sheet in the next few weeks. These instructions provide you with information on caring for yourself after your procedure. Your health care provider may also give you more specific instructions. Your treatment has been planned according to current medical practices, but problems sometimes occur. Call your health care provider if you have any problems or questions after your procedure.  WHAT TO EXPECT AFTER THE PROCEDURE After your procedure, it is  typical to have the following:  Pain.  Feeling tired.  Poor appetite.  Less interest in sex. It takes 4-6 weeks to recover from this surgery.  HOME CARE INSTRUCTIONS   Take pain medicines only as directed by your health care provider. Do not take over-the-counter pain medicines without checking with your health care provider first.  Change your bandage as directed by your health care provider.  Return to your health care provider to have your sutures taken out.  Take showers instead of baths for 2-3 weeks. Ask your health care provider when it is safe to start showering.  Do not douche, use tampons, or have sexual intercourse for at least 6 weeks or until your health care provider says you can.   Follow your health care provider's advice about exercise, lifting, driving, and general activities.  Get plenty of rest and sleep.   Do not lift anything heavier than a gallon of milk (about 10 lb [4.5 kg]) for the first month after surgery.  You can resume your normal diet if your health care provider says it is okay.   Do not drink alcohol until your health care provider says you can.   If you are constipated, ask your health care provider if you can take a mild laxative.  Eating foods high in fiber may also help with constipation. Eat plenty of raw fruits and vegetables, whole grains, and beans.  Drink  enough fluids to keep your urine clear or pale yellow.   Try to have someone at home with you for the first 1-2 weeks to help around the house.  Keep all follow-up appointments. SEEK MEDICAL CARE IF:   You have chills or fever.  You have swelling, redness, or pain in the area of your incision that is getting worse.   You have pus coming from the incision.   You notice a bad smell coming from the incision or bandage.   Your incision breaks open.   You feel dizzy or light-headed.   You have pain or bleeding when you urinate.   You have persistent diarrhea.    You have persistent nausea and vomiting.   You have abnormal vaginal discharge.   You have a rash.   You have any type of abnormal reaction or develop an allergy to your medicine.   Your pain medicine is not helping.  SEEK IMMEDIATE MEDICAL CARE IF:   You have a fever and your symptoms suddenly get worse.  You have severe abdominal pain.  You have chest pain.  You have shortness of breath.  You faint.  You have pain, swelling, or redness of your leg.  You have heavy vaginal bleeding with blood clots. MAKE SURE YOU:  Understand these instructions.  Will watch your condition.  Will get help right away if you are not doing well or get worse.   This information is not intended to replace advice given to you by your health care provider. Make sure you discuss any questions you have with your health care provider.   Document Released: 03/25/2005 Document Revised: 09/26/2014 Document Reviewed: 06/28/2013 Elsevier Interactive Patient Education [employer].

## 2015-08-02 NOTE — Discharge Summary (Signed)
Physician Discharge Summary  Patient ID: Quiara Killian MRN: [id number] DOB/AGE: Feb 03, 1941 74 y.o.  Admit date: 07/28/2015 Discharge date: 08/02/2015  Admission Diagnoses: Ovarian cancer Discharge Diagnoses:  Active Problems:   Ovarian cancer ([place])   Ileus, postoperative   Discharged Condition: stable  Hospital Course: On 07/28/2015, the patient underwent the following: Procedure(s): EXPLORATORY LAPAROTOMY TOTAL HYSTERECTOMY ABDOMINAL BILATERAL SALPINGO OOPHRORECTOMY OMENTECTOMY  DEBULKING.   The postoperative course was remarkable for slow return of bowel function.  She also was anemic after chemotherapy and was transfused 2 units of PRBC preoperatively on the day of surgery.  Serial lab documented a stable postoperative hgb/hct.  She was discharged to home on postoperative day 5 tolerating a regular diet.  Consults: None  Significant Diagnostic Studies: AXR  Treatments: surgery: see above  Discharge Exam: Blood pressure 126/59, pulse 90, temperature 98.2 F (36.8 C), temperature source Oral, resp. rate 16, height 5' 6"  (1.676 m), weight 141 lb 5 oz (64.1 kg), SpO2 100 %. General appearance: alert Cardio: regular rate and rhythm, S1, S2 normal, no murmur, click, rub or gallop GI: soft, non-tender; bowel sounds normal; no masses,  no organomegaly Extremities: extremities normal, atraumatic, no cyanosis or edema and Homans sign is negative, no sign of DVT Incision/Wound: C/D/I  Disposition: 01-Home or Self Care  Discharge Instructions    Activity as tolerated - No restrictions    Complete by:  As directed      Call MD for:  extreme fatigue    Complete by:  As directed      Call MD for:  persistant dizziness or light-headedness    Complete by:  As directed      Call MD for:  persistant nausea and vomiting    Complete by:  As directed      Call MD for:  redness, tenderness, or signs of infection (pain, swelling, redness, odor or green/yellow discharge around incision  site)    Complete by:  As directed      Call MD for:  severe uncontrolled pain    Complete by:  As directed      Call MD for:  temperature >100.4    Complete by:  As directed      Diet - low sodium heart healthy    Complete by:  As directed      Diet Carb Modified    Complete by:  As directed      Diet general    Complete by:  As directed      Discharge instructions    Complete by:  As directed   Activity: 1. Be up and out of the bed during the day.  Take a nap if needed.  You may walk up steps but be careful and use the hand rail.  Stair climbing will tire you more than you think, you may need to stop part way and rest.   2. No lifting or straining for 6 weeks.  3. No driving for 1-2 weeks.  Do Not drive if you are taking narcotic pain medicine.  4. Shower daily.  Use soap and water on your incision and pat dry; don't rub.   5. No sexual activity and nothing in the vagina for 4 weeks.  Diet: 1. Low sodium Heart Healthy Diet is recommended.  2. It is safe to use a laxative if you have difficulty moving your bowels.   Wound Care: 1. Keep clean and dry.  Shower daily.  Reasons to call the Doctor:  Fever -  Oral temperature greater than 100.4 degrees Fahrenheit Foul-smelling vaginal discharge Difficulty urinating Nausea and vomiting Increased pain at the site of the incision that is unrelieved with pain medicine. Difficulty breathing with or without chest pain New calf pain especially if only on one side Sudden, continuing increased vaginal bleeding with or without clots.   Follow-up: 1. See Everitt Amber in 4 weeks.  Contacts: For questions or concerns you should contact:  Dr. Everitt Amber at [phone]  or at [facility] [phone]/11/2014     Discharge wound care:    Complete by:  As directed   Keep clean and dry     Driving Restrictions    Complete by:  As directed   No driving for 1- 2 weeks     Increase activity slowly    Complete by:  As directed      Lifting  restrictions    Complete by:  As directed   No lifting > 5 lbs for 6 weeks     May shower / Bathe    Complete by:  As directed   No tub baths for 6 weeks     May walk up steps    Complete by:  As directed      Sexual Activity Restrictions    Complete by:  As directed   No intercourse for 6 - 8 weeks            Medication List    TAKE these medications        acetaminophen 500 MG tablet  Commonly known as:  TYLENOL  Take 2 tablets (1,000 mg total) by mouth every 12 (twelve) hours.     amLODipine 10 MG tablet  Commonly known as:  NORVASC  Take 10 mg by mouth every evening.     aspirin EC 81 MG tablet  Take 81 mg by mouth daily.     CARBOPLATIN IV  Inject into the vein every 21 ( twenty-one) days. To start 05/13/15     dexamethasone 4 MG tablet  Commonly known as:  DECADRON  The day before chemo take 5 tablets (22m) in the am and 5 tablets (265m in the pm. The morning of chemo take 5 tablets (2074m     enoxaparin 40 MG/0.4ML injection  Commonly known as:  LOVENOX  Inject 0.4 mLs (40 mg total) into the skin daily.     ferrous sulfate 325 (65 FE) MG EC tablet  Take 325 mg by mouth 2 (two) times daily.     folic acid 1 MG tablet  Commonly known as:  FOLVITE  Take 1 mg by mouth daily.     lidocaine-prilocaine cream  Commonly known as:  EMLA  Apply a quarter size amount to port site 1 hour prior to chemo. Do not rub in. Cover with plastic wrap.     lovastatin 10 MG tablet  Commonly known as:  MEVACOR  Take 10 mg by mouth at bedtime.     megestrol 400 MG/10ML suspension  Commonly known as:  MEGACE  Take 10 mLs (400 mg total) by mouth daily.     metoprolol tartrate 25 MG tablet  Commonly known as:  LOPRESSOR  Take 1 tablet (25 mg total) by mouth 2 (two) times daily.     mirtazapine 15 MG tablet  Commonly known as:  REMERON  Take 7.5 mg by mouth at bedtime.     multivitamin with minerals Tabs tablet  Take 1 tablet by mouth daily.     NEULASTA  ONPRO [place]   Inject into the skin every 21 ( twenty-one) days. To be applied to skin at the end of chemo.     ondansetron 8 MG tablet  Commonly known as:  ZOFRAN  Take 1 tablet (8 mg total) by mouth every 8 (eight) hours as needed for nausea or vomiting.     ondansetron 4 MG tablet  Commonly known as:  ZOFRAN  Take 1 tablet (4 mg total) by mouth every 6 (six) hours as needed for nausea.     oxyCODONE 5 MG immediate release tablet  Commonly known as:  Oxy IR/ROXICODONE  Take 1 tablet (5 mg total) by mouth every 4 (four) hours as needed for severe pain or breakthrough pain.     prochlorperazine 10 MG tablet  Commonly known as:  COMPAZINE  Take 1 tablet (10 mg total) by mouth every 6 (six) hours as needed (Nausea or vomiting).     QUEtiapine 300 MG 24 hr tablet  Commonly known as:  SEROQUEL XR  Take 300 mg by mouth at bedtime.     sulfaSALAzine 500 MG tablet  Commonly known as:  AZULFIDINE  Take 500 mg by mouth 3 (three) times daily.     TAXOL IV  Inject into the vein every 21 ( twenty-one) days. To start 05/13/15     traMADol 50 MG tablet  Commonly known as:  ULTRAM  Take 1 tablet (50 mg total) by mouth every 6 (six) hours as needed.     vitamin B-12 1000 MCG tablet  Commonly known as:  CYANOCOBALAMIN  Take 1,000 mcg by mouth daily.     Vitamin D-3 1000 UNITS Caps  Take 1,000 Units by mouth daily.           Follow-up Information    Follow up with [name], MD. Schedule an appointment as soon as possible for a visit in 1 month.   Specialty:  Obstetrics and Gynecology   Why:  postop f/u   Contact information:   [place] [phone]       Signed: Agnes Lawrence 08/02/2015, 12:06 PM

## 2015-08-02 NOTE — Progress Notes (Signed)
Pt's vitals WNL, tolerating diet and pain is under control. Discussed discharge instructions and did lovenox teaching at bedside. Answered all questions and concerns. Discharged to home.

## 2015-08-03 ENCOUNTER — Telehealth: Payer: Self-pay | Admitting: Gynecologic Oncology

## 2015-08-03 NOTE — Telephone Encounter (Signed)
Patient called requesting post-op appointment.  Stating she is doing well post-operatively.  Advised to call for any questions or concerns.

## 2015-08-05 ENCOUNTER — Ambulatory Visit (HOSPITAL_COMMUNITY): Payer: Medicare Other | Admitting: Hematology & Oncology

## 2015-08-05 ENCOUNTER — Inpatient Hospital Stay (HOSPITAL_COMMUNITY): Payer: Medicare Other

## 2015-08-12 ENCOUNTER — Inpatient Hospital Stay (HOSPITAL_COMMUNITY): Payer: Medicare Other

## 2015-08-24 ENCOUNTER — Ambulatory Visit: Payer: Medicare Other | Attending: Gynecologic Oncology | Admitting: Gynecologic Oncology

## 2015-08-24 ENCOUNTER — Encounter: Payer: Self-pay | Admitting: Gynecologic Oncology

## 2015-08-24 VITALS — BP 131/60 | HR 78 | Temp 98.4°F | Resp 18 | Ht 66.0 in | Wt 131.3 lb

## 2015-08-24 DIAGNOSIS — C569 Malignant neoplasm of unspecified ovary: Secondary | ICD-10-CM | POA: Insufficient documentation

## 2015-08-24 DIAGNOSIS — C562 Malignant neoplasm of left ovary: Secondary | ICD-10-CM | POA: Diagnosis not present

## 2015-08-24 DIAGNOSIS — Z7189 Other specified counseling: Secondary | ICD-10-CM | POA: Diagnosis not present

## 2015-08-24 NOTE — Progress Notes (Signed)
No PCP Per Patient No address on file    Ovarian cancer [facility])   04/25/2015 - 04/28/2015 Hospital Admission Nausea/diarrhea.  Oncology consult completed on 04/27/2015.   04/25/2015 Tumor Marker CA 125- 3902.      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   04/27/2015 Procedure US Paracentesis- A total of approximately 3700 mL of amber colored fluid was removed. A fluid sample was sent for laboratory analysis.   04/27/2015 Imaging CT Chest- Mild left supraclavicular lymphadenopathy and moderate mediastinal lymphadenopathy involving the prevascular, subcarinal and bilateral pericardiophrenic nodal chains, likely metastatic.   04/27/2015 Pathology Results Diagnosis PERITONEAL/ASCITIC FLUID(SPECIMEN 1 OF 1 COLLECTED 04/27/15): MALIGNANT CELLS CONSISTENT WITH METASTATIC ADENOCARCINOMA.  The immunophenotype is most consistent with a gynecologic primary, most likely ovary.   05/08/2015 Miscellaneous Seen by Dr. Denman George- recommending Carboplatin/Paclitaxel x 3 cycles and return visit to see her (within 1 week of administration of third cycle) to evaluate for optimal sequencing of treatment modalities.   05/13/2015 - 05/15/2015 Hospital Admission Hospitalized for AKI   05/19/2015 -  Chemotherapy Carboplatin/Paclitaxel    CURRENT THERAPY: Carboplatin/Paclitaxel every 21 days beginning on 05/19/2015.  INTERVAL HISTORY: Latasha [name] 74 y.o. 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  She completed 3 cycles of neoadjuvant Carboplatin/Taxol with an excellent response and tolerance. She underwent an exploratory laparotomy with TAH/BSO, omentectomy and radical tumor debulking with Dr. Denman George on 07/28/2015.  She has done extremely  well. Per reports Dr. Denman George was able to do an optimal cytoreduction (R0) with no gross visible disease remaining.   Latasha [name] looks great. She notes she feels well. She has minor abdominal pain on occasion. She notes however that she is up and about every day.  She is here to restart her final 3 cycles of Carboplatin/Taxol.  She has an anemia that has been problematic. On chart review she did have a transfusion during recent her hospital stay.  Past Medical History  Diagnosis Date  . Hypertension   . Schizophrenia ([place])   . 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 Ulcerative colitis ([place])   . Mixed hyperlipidemia   . Vitamin D deficiency   . B12 deficiency   . Iron deficiency anemia   . Ovarian cancer ([place])   . Cataract   . Arthritis   . History of blood transfusion   . History of chemotherapy     has Renal failure; Nausea without vomiting; Ascites, malignant; Ovarian cancer ([place]); Omental mass; Acute kidney injury ([place]); UTI (lower urinary tract infection); Normocytic anemia; IBD (inflammatory bowel disease); Schizophrenia, unspecified type ([place]); Diarrhea; Malnutrition of moderate degree ([place]); Acute renal failure ([place]); and Ileus, postoperative on her problem list.     has No Known Allergies.  Current Outpatient Prescriptions on File Prior to Visit  Medication Sig Dispense Refill  . acetaminophen (TYLENOL) 500 MG tablet Take 2 tablets (1,000 mg total) by mouth every 12 (twelve) hours. 30 tablet 0  . amLODipine (NORVASC) 10 MG tablet Take 10 mg by mouth every evening.     Marland Kitchen aspirin EC 81 MG tablet Take 81 mg by mouth daily.    . Cholecalciferol (VITAMIN D-3) 1000 UNITS CAPS Take 1,000 Units by mouth daily.     Marland Kitchen enoxaparin (LOVENOX) 40 MG/0.4ML injection  Inject 0.4 mLs (40 mg total) into the skin daily. 24 Syringe 0  . ferrous sulfate 325 (65 FE) MG EC tablet Take 325 mg by mouth 2 (two) times daily.    . folic acid (FOLVITE) 1 MG tablet Take 1 mg by mouth daily.    Marland Kitchen lidocaine-prilocaine (EMLA)  cream Apply a quarter size amount to port site 1 hour prior to chemo. Do not rub in. Cover with plastic wrap. 30 g 3  . lovastatin (MEVACOR) 10 MG tablet Take 10 mg by mouth at bedtime.    . megestrol (MEGACE) 400 MG/10ML suspension Take 10 mLs (400 mg total) by mouth daily. 240 mL 0  . metoprolol tartrate (LOPRESSOR) 25 MG tablet Take 1 tablet (25 mg total) by mouth 2 (two) times daily. 60 tablet 1  . mirtazapine (REMERON) 15 MG tablet Take 7.5 mg by mouth at bedtime.    . Multiple Vitamin (MULTIVITAMIN WITH MINERALS) TABS tablet Take 1 tablet by mouth daily.    . QUEtiapine (SEROQUEL XR) 300 MG 24 hr tablet Take 300 mg by mouth at bedtime.    . sulfaSALAzine (AZULFIDINE) 500 MG tablet Take 500 mg by mouth 3 (three) times daily.    . vitamin B-12 (CYANOCOBALAMIN) 1000 MCG tablet Take 1,000 mcg by mouth daily.    Marland Kitchen CARBOPLATIN IV Inject into the vein every 21 ( twenty-one) days. To start 05/13/15    . ondansetron (ZOFRAN) 4 MG tablet Take 1 tablet (4 mg total) by mouth every 6 (six) hours as needed for nausea. (Patient not taking: Reported on 08/25/2015) 20 tablet 0  . ondansetron (ZOFRAN) 8 MG tablet Take 1 tablet (8 mg total) by mouth every 8 (eight) hours as needed for nausea or vomiting. (Patient not taking: Reported on 08/25/2015) 30 tablet 2  . PACLitaxel (TAXOL IV) Inject into the vein every 21 ( twenty-one) days. To start 05/13/15    . Pegfilgrastim (NEULASTA ONPRO [place]) Inject into the skin every 21 ( twenty-one) days. To be applied to skin at the end of chemo.    . prochlorperazine (COMPAZINE) 10 MG tablet Take 1 tablet (10 mg total) by mouth every 6 (six) hours as needed (Nausea or vomiting). (Patient not taking: Reported on 08/25/2015) 30 tablet 1  . traMADol (ULTRAM) 50 MG tablet Take 1 tablet (50 mg total) by mouth every 6 (six) hours as needed. (Patient not taking: Reported on 08/25/2015) 30 tablet 1   No current facility-administered medications on file prior to visit.    Past Surgical  History  Procedure Laterality Date  . Replacement total knee      right knee in 2003  . Paracentesis  04/27/15  . Portacath placement Right 04/2015  . Laparotomy N/A 07/28/2015    Procedure: EXPLORATORY LAPAROTOMY;  Surgeon: Everitt Amber, MD;  Location: [facility];  Service: Gynecology;  Laterality: N/A;  . Abdominal hysterectomy N/A 07/28/2015    Procedure: TOTAL HYSTERECTOMY ABDOMINAL BILATERAL SALPINGO OOPHRORECTOMY;  Surgeon: Everitt Amber, MD;  Location: [facility];  Service: Gynecology;  Laterality: N/A;  . Omentectomy N/A 07/28/2015    Procedure: OMENTECTOMY ;  Surgeon: Everitt Amber, MD;  [facility];  Service: Gynecology;  Laterality: N/A;  . Debulking N/A 07/28/2015    Procedure: DEBULKING;  Surgeon: Everitt Amber, MD;  [facility];  Service: Gynecology;  Laterality: N/A;    Denies any headaches, dizziness, double vision, fevers, chills, night sweats, nausea, vomiting, diarrhea, constipation, chest pain, heart palpitations, shortness of breath, blood in stool, black  tarry stool, urinary pain, urinary burning, urinary frequency, hematuria.  14 point review of systems was performed and is negative except as detailed under history of present illness and above  PHYSICAL EXAMINATION  ECOG PERFORMANCE STATUS: 1 - Symptomatic but completely ambulatory  Filed Vitals:   08/25/15 1008  BP: 114/55  Pulse: 83  Temp: 97 F (36.1 C)  Resp: 16    GENERAL:alert, no distress, well nourished, well developed, comfortable, cooperative, smiling and accompanied by her son, Jeneen Rinks. SKIN: skin color, texture, turgor are normal, no rashes or significant lesions HEAD: Normocephalic, No masses, lesions, tenderness or abnormalities EYES: normal, PERRLA, EOMI, Conjunctiva are pink and non-injected EARS: External ears normal OROPHARYNX:lips, buccal mucosa, and tongue normal and mucous membranes are moist  NECK: supple, trachea midline LYMPH:  NO palpable adenopathy in the neck, axillae  BREAST:not  examined LUNGS: CTA B/L HEART: RRR without murmur, rub or gallop. ABDOMEN:abdomen soft and non-tender, no rebound or guarding. Well healed surgical incision site.  BACK: Back symmetric, no curvature. EXTREMITIES:less then 2 second capillary refill, no joint deformities, effusion, or inflammation, no skin discoloration, positive findings:   NEURO: alert & oriented x 3 with fluent speech, no focal motor/sensory deficits, gait normal   LABORATORY DATA: I have reviewed the data as listed  CBC    Component Value Date/Time   WBC 4.3 08/25/2015 1210   RBC 2.49* 08/25/2015 1210   RBC 2.55* 06/10/2015 1015   HGB 8.4* 08/25/2015 1210   HCT 25.1* 08/25/2015 1210   PLT 365 08/25/2015 1210   MCV 100.8* 08/25/2015 1210   MCH 33.7 08/25/2015 1210   MCHC 33.5 08/25/2015 1210   RDW 16.2* 08/25/2015 1210   LYMPHSABS 1.2 08/25/2015 1210   MONOABS 0.5 08/25/2015 1210   EOSABS 0.2 08/25/2015 1210   BASOSABS 0.0 08/25/2015 1210      Chemistry      Component Value Date/Time   NA 139 08/25/2015 1210   K 4.0 08/25/2015 1210   CL 107 08/25/2015 1210   CO2 25 08/25/2015 1210   BUN 21* 08/25/2015 1210   CREATININE 1.08* 08/25/2015 1210      Component Value Date/Time   CALCIUM 9.6 08/25/2015 1210   ALKPHOS 43 08/25/2015 1210   AST 19 08/25/2015 1210   ALT 14 08/25/2015 1210   BILITOT 0.4 08/25/2015 1210     Lab Results  Component Value Date   CA125 249.0* 07/01/2015    Lab Results  Component Value Date   CEA 0.5 04/26/2015   PATHOLOGY:     ASSESSMENT AND PLAN:  Stage IVB Ovarian Carcinoma Malignant Ascites Anemia Leukocytosis  She has done very well with therapy. She achieved an R0 resection at the time of surgery. She is here today to restart her final 3 cycles of carboplatin and Taxol. We reviewed the reasons behind completing her chemotherapy. She is agreeable and wishes to proceed.  Her anemia is stable today. The cause of her anemia is not completely clear. Ferritin is  elevated, B12 and folate are normal. At this point we will need to proceed with a more thorough investigation. I will recommend stool cards as well. Unfortunately she may need another transfusion prior to completion of treatment. She has evidence of chronic kidney disease but certainly not to the point that I feel could explain her degree of anemia.   Continue with treatment as planned, carboplatin/taxol. Plan is for additional treatment with 3 cycles of carboplatin/taxol. Ca-125 will be monitored. .  All questions were answered.  The patient knows to call the clinic with any problems, questions or concerns. We can certainly see the patient much sooner if necessary.  This note is electronically signed by: Molli Hazard, MD  5:51 PM

## 2015-08-24 NOTE — Progress Notes (Signed)
Followup Note: Gyn-Onc  Latasha [name] 74 y.o. 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 Ovarian Cancer    Post - Op follow up    Assessment/Plan:  Latasha [name]  is a 74 y.o.  year old with stage IVB ovarian cancer, with peritoneal carcinomatosis and pulmonary nodal metastases (clinical) on chest CT, confirmed on cytology of the peritoneal ascites.  She is 3 weeks s/p ex lap, TAH, BSO, omentectomy, interval debulking surgery. She had an excellent response (radiographically and serologically) to her first 3 cycles of chemotherapy.  I discussed that all macroscopic disease was resected, though she will need additional chemotherapy (3 cycles minimum) of chemotherapy to resolve microscopic disease.  We will return her to Dr Whitney Muse for these cycles. I look forward to seeing her back after she has completed these with a baseline scan and, if no measurable disease, she will transition to surveillance.  HPI: Latasha [name] is a very pleasant 74 year old G1 who is seen in consultation at the request of Dr Whitney Muse for (clinical) stage IVB ovarian cancer. The patient developed symptoms of progressive abdominal fullness and discomfort over the course of several months. In August, 2016 she presented to the [facility] where imaging of the abdomen and pelvis was obtained for diarrhea, nausea and emesis. Imaging on 04/25/15 revealed large volume ascites, carcinomatosis and peritoneal masses measuring up to 5.1cm. The patient underwent paracentesis with cytology on 04/27/15 which revealed metastatic adenocarcinoma consistent on immunostains with gyn primary.  CT of the chest on 04/27/15 revealed mild left supraclavicular lymphadenopathy, and mediastinal lymphadenopathy consistent with metastatic disease. There were <8m pulmonary nodules also seen which were indeterminant. There were trace pleural effusions seen.   CA 125 on  04/25/15: 3902.  She then went on to receive 3 cycles of paclitaxel and carboplatin chemotherapy with Dr PWhitney Muse Cycle 3 was on 07/01/15.  CA 125 on 07/01/15 was 249.  CT scan on 07/20/15 showed: Previously noted ascites has resolved. No pneumoperitoneum. Many of the previously noted peritoneal implants are no longer confidently identified on today's examination. There are few smaller implants noted, the largest of which is in th  left side of the abdomen anteriorly measuring 9 x 11 mm. There is also a 10 x 7 mm lesion superficial to the splenic flexure of the colon which is substantially smaller than the prior study (previously 2.8 x 1.7 cm).  There was resolution of the chest adenopathy.  She has tolerated chemotherapy well with a good appetites and good general function.   Interval Hx:  On 07/31/15 she was taken to the OR for an ex lap, TAH, BSO, omentectomy, radical tumor debulking. Intraoperative findings included: Excellent response to neoadjuvant chemotherapy. Small volume plaques/nodules in left and right posterior cul de sac behind uterus, tumor on left ovary (3cm), multiple 2cm nodules in omentum. No other peritoneal disease. Diaphragm normal.  She had an optimal cytoreduction (R0) with no gross visible disease remaining.  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  Postoperatively she did very well with no postoperative complications.  She is eating well, has no pain, no fevers and a normal bowel habit.   Current Meds:  Outpatient Encounter Prescriptions as of 08/24/2015  Medication Sig  . acetaminophen (TYLENOL) 500 MG tablet Take 2 tablets (1,000 mg total) by mouth every 12 (twelve) hours.  .Marland KitchenamLODipine (NORVASC) 10 MG tablet Take 10  mg by mouth every evening.   Marland Kitchen aspirin EC 81 MG tablet Take 81 mg by mouth daily.  Marland Kitchen CARBOPLATIN IV Inject into the vein every 21 ( twenty-one) days. To start 05/13/15  . Cholecalciferol (VITAMIN D-3) 1000  UNITS CAPS Take 1,000 Units by mouth daily.   Marland Kitchen dexamethasone (DECADRON) 4 MG tablet The day before chemo take 5 tablets (48m) in the am and 5 tablets (219m in the pm. The morning of chemo take 5 tablets (2013m  . enoxaparin (LOVENOX) 40 MG/0.4ML injection Inject 0.4 mLs (40 mg total) into the skin daily.  . ferrous sulfate 325 (65 FE) MG EC tablet Take 325 mg by mouth 2 (two) times daily.  . folic acid (FOLVITE) 1 MG tablet Take 1 mg by mouth daily.  . lMarland Kitchendocaine-prilocaine (EMLA) cream Apply a quarter size amount to port site 1 hour prior to chemo. Do not rub in. Cover with plastic wrap.  . lovastatin (MEVACOR) 10 MG tablet Take 10 mg by mouth at bedtime.  . megestrol (MEGACE) 400 MG/10ML suspension Take 10 mLs (400 mg total) by mouth daily.  . metoprolol tartrate (LOPRESSOR) 25 MG tablet Take 1 tablet (25 mg total) by mouth 2 (two) times daily.  . mirtazapine (REMERON) 15 MG tablet Take 7.5 mg by mouth at bedtime.  . Multiple Vitamin (MULTIVITAMIN WITH MINERALS) TABS tablet Take 1 tablet by mouth daily.  . ondansetron (ZOFRAN) 4 MG tablet Take 1 tablet (4 mg total) by mouth every 6 (six) hours as needed for nausea.  . ondansetron (ZOFRAN) 8 MG tablet Take 1 tablet (8 mg total) by mouth every 8 (eight) hours as needed for nausea or vomiting.  . PMarland KitchenCLitaxel (TAXOL IV) Inject into the vein every 21 ( twenty-one) days. To start 05/13/15  . Pegfilgrastim (NEULASTA ONPRO [place]) Inject into the skin every 21 ( twenty-one) days. To be applied to skin at the end of chemo.  . prochlorperazine (COMPAZINE) 10 MG tablet Take 1 tablet (10 mg total) by mouth every 6 (six) hours as needed (Nausea or vomiting).  . QUEtiapine (SEROQUEL XR) 300 MG 24 hr tablet Take 300 mg by mouth at bedtime.  . sulfaSALAzine (AZULFIDINE) 500 MG tablet Take 500 mg by mouth 3 (three) times daily.  . traMADol (ULTRAM) 50 MG tablet Take 1 tablet (50 mg total) by mouth every 6 (six) hours as needed.  . vitamin B-12 (CYANOCOBALAMIN) 1000  MCG tablet Take 1,000 mcg by mouth daily.  . [DISCONTINUED] oxyCODONE (OXY IR/ROXICODONE) 5 MG immediate release tablet Take 1 tablet (5 mg total) by mouth every 4 (four) hours as needed for severe pain or breakthrough pain.   No facility-administered encounter medications on file as of 08/24/2015.    Allergy: No Known Allergies  Social Hx:   Social History   Social History  . Marital Status: Single    Spouse Name: N/A  . Number of Children: N/A  . Years of Education: N/A   Occupational History  . Not on file.   Social History Main Topics  . Smoking status: Never Smoker   . Smokeless tobacco: Never Used  . Alcohol Use: No  . Drug Use: No  . Sexual Activity: Not on file   Other Topics Concern  . Not on file   Social History Narrative    Past Surgical Hx:  Past Surgical History  Procedure Laterality Date  . Replacement total knee      right knee in 2003  . Paracentesis  04/27/15  .  Portacath placement Right 04/2015  . Laparotomy N/A 07/28/2015    Procedure: EXPLORATORY LAPAROTOMY;  Surgeon: Everitt Amber, MD;  Location: [facility];  Service: Gynecology;  Laterality: N/A;  . Abdominal hysterectomy N/A 07/28/2015    Procedure: TOTAL HYSTERECTOMY ABDOMINAL BILATERAL SALPINGO OOPHRORECTOMY;  Surgeon: Everitt Amber, MD;  [facility];  Service: Gynecology;  Laterality: N/A;  . Omentectomy N/A 07/28/2015    Procedure: OMENTECTOMY ;  Surgeon: Everitt Amber, MD;  [facility];  Service: Gynecology;  Laterality: N/A;  . Debulking N/A 07/28/2015    Procedure: DEBULKING;  Surgeon: Everitt Amber, MD;  [facility];  Service: Gynecology;  Laterality: N/A;    Past Medical Hx:  Past Medical History  Diagnosis Date  . Hypertension   . Schizophrenia ([place])   . 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 Ulcerative colitis ([place])   . Mixed hyperlipidemia   . Vitamin D deficiency   . B12 deficiency   . Iron deficiency anemia   . Ovarian cancer ([place])   . Cataract   . Arthritis   . History of blood  transfusion   . History of chemotherapy     Past Gynecological History:  SVD x 1  No LMP recorded. Patient is postmenopausal.  Family Hx:  Family History  Problem Relation Age of Onset  . Prostate cancer Brother   . Cancer Paternal Uncle   . Lung cancer Maternal Grandmother   . Hypertension Maternal Grandfather     Review of Systems:  Constitutional  Feels well,    ENT Normal appearing ears and nares bilaterally Skin/Breast  No rash, sores, jaundice, itching, dryness Cardiovascular  No chest pain, shortness of breath, or edema  Pulmonary  No cough or wheeze.  Gastro Intestinal  No nausea, vomitting, or diarrhoea. No bright red blood per rectum, no abdominal pain, change in bowel movement, or constipation.  Genito Urinary  No frequency, urgency, dysuria, no postmenopausal bleeding Musculo Skeletal  No myalgia, arthralgia, joint swelling or pain  Neurologic  No weakness, numbness, change in gait,  Psychology  No depression, anxiety, insomnia.   Vitals:  Blood pressure 131/60, pulse 78, temperature 98.4 F (36.9 C), temperature source Oral, resp. rate 18, height 5' 6"  (1.676 m), weight 131 lb 4.8 oz (59.557 kg), SpO2 100 %.  Physical Exam: WD in NAD Neck  Supple NROM, without any enlargements.  Lymph Node Survey No cervical supraclavicular or inguinal adenopathy Cardiovascular  Pulse normal rate, regularity and rhythm. S1 and S2 normal.  Lungs  Clear to auscultation bilateraly, without wheezes/crackles/rhonchi. Good air movement.  Skin  No rash/lesions/breakdown  Psychiatry  Alert and oriented to person, place, and time  Abdomen  Normoactive bowel sounds, abdomen soft, non distended, non-tender and thin without evidence of hernia.  Incision well healed. Back No CVA tenderness Genito Urinary  Vaginal cuff normal with no lesions. In tact vagina.No bleeding. Surgically absent uterus and cervix. Extremities  No bilateral cyanosis, clubbing or edema.   [name], MD   08/24/2015, 1:47 PM

## 2015-08-24 NOTE — Patient Instructions (Signed)
Plan to see Dr. Denman George after you complete chemotherapy.  Plan for three more cycles.

## 2015-08-25 ENCOUNTER — Encounter (HOSPITAL_COMMUNITY): Payer: Self-pay | Admitting: Hematology & Oncology

## 2015-08-25 ENCOUNTER — Encounter (HOSPITAL_COMMUNITY): Payer: Medicare Other | Attending: Oncology | Admitting: Hematology & Oncology

## 2015-08-25 VITALS — BP 114/55 | HR 83 | Temp 97.0°F | Resp 16 | Wt 132.6 lb

## 2015-08-25 DIAGNOSIS — C786 Secondary malignant neoplasm of retroperitoneum and peritoneum: Secondary | ICD-10-CM | POA: Diagnosis not present

## 2015-08-25 DIAGNOSIS — E538 Deficiency of other specified B group vitamins: Secondary | ICD-10-CM | POA: Diagnosis not present

## 2015-08-25 DIAGNOSIS — C562 Malignant neoplasm of left ovary: Secondary | ICD-10-CM | POA: Diagnosis present

## 2015-08-25 DIAGNOSIS — C771 Secondary and unspecified malignant neoplasm of intrathoracic lymph nodes: Secondary | ICD-10-CM | POA: Insufficient documentation

## 2015-08-25 DIAGNOSIS — I10 Essential (primary) hypertension: Secondary | ICD-10-CM | POA: Insufficient documentation

## 2015-08-25 DIAGNOSIS — E559 Vitamin D deficiency, unspecified: Secondary | ICD-10-CM | POA: Insufficient documentation

## 2015-08-25 DIAGNOSIS — E782 Mixed hyperlipidemia: Secondary | ICD-10-CM | POA: Insufficient documentation

## 2015-08-25 DIAGNOSIS — J910 Malignant pleural effusion: Secondary | ICD-10-CM

## 2015-08-25 DIAGNOSIS — D72829 Elevated white blood cell count, unspecified: Secondary | ICD-10-CM

## 2015-08-25 DIAGNOSIS — Z79899 Other long term (current) drug therapy: Secondary | ICD-10-CM | POA: Diagnosis not present

## 2015-08-25 DIAGNOSIS — D649 Anemia, unspecified: Secondary | ICD-10-CM | POA: Diagnosis not present

## 2015-08-25 DIAGNOSIS — Z7982 Long term (current) use of aspirin: Secondary | ICD-10-CM | POA: Insufficient documentation

## 2015-08-25 DIAGNOSIS — C569 Malignant neoplasm of unspecified ovary: Secondary | ICD-10-CM

## 2015-08-25 LAB — CBC WITH DIFFERENTIAL/PLATELET
BASOS PCT: 1 %
Basophils Absolute: 0 10*3/uL (ref 0.0–0.1)
Eosinophils Absolute: 0.2 10*3/uL (ref 0.0–0.7)
Eosinophils Relative: 5 %
HEMATOCRIT: 25.1 % — AB (ref 36.0–46.0)
HEMOGLOBIN: 8.4 g/dL — AB (ref 12.0–15.0)
LYMPHS ABS: 1.2 10*3/uL (ref 0.7–4.0)
Lymphocytes Relative: 27 %
MCH: 33.7 pg (ref 26.0–34.0)
MCHC: 33.5 g/dL (ref 30.0–36.0)
MCV: 100.8 fL — ABNORMAL HIGH (ref 78.0–100.0)
MONOS PCT: 12 %
Monocytes Absolute: 0.5 10*3/uL (ref 0.1–1.0)
NEUTROS ABS: 2.4 10*3/uL (ref 1.7–7.7)
NEUTROS PCT: 55 %
Platelets: 365 10*3/uL (ref 150–400)
RBC: 2.49 MIL/uL — AB (ref 3.87–5.11)
RDW: 16.2 % — ABNORMAL HIGH (ref 11.5–15.5)
WBC: 4.3 10*3/uL (ref 4.0–10.5)

## 2015-08-25 LAB — COMPREHENSIVE METABOLIC PANEL
ALBUMIN: 4.1 g/dL (ref 3.5–5.0)
ALK PHOS: 43 U/L (ref 38–126)
ALT: 14 U/L (ref 14–54)
ANION GAP: 7 (ref 5–15)
AST: 19 U/L (ref 15–41)
BILIRUBIN TOTAL: 0.4 mg/dL (ref 0.3–1.2)
BUN: 21 mg/dL — AB (ref 6–20)
CALCIUM: 9.6 mg/dL (ref 8.9–10.3)
CO2: 25 mmol/L (ref 22–32)
CREATININE: 1.08 mg/dL — AB (ref 0.44–1.00)
Chloride: 107 mmol/L (ref 101–111)
GFR calc Af Amer: 58 mL/min — ABNORMAL LOW (ref >60)
GFR calc non Af Amer: 50 mL/min — ABNORMAL LOW (ref >60)
GLUCOSE: 92 mg/dL (ref 65–99)
Potassium: 4 mmol/L (ref 3.5–5.1)
SODIUM: 139 mmol/L (ref 135–145)
TOTAL PROTEIN: 7.1 g/dL (ref 6.5–8.1)

## 2015-08-25 MED ORDER — DEXAMETHASONE 4 MG PO TABS: ORAL_TABLET | ORAL | Status: DC

## 2015-08-25 MED ORDER — OXYCODONE-ACETAMINOPHEN 5-325 MG PO TABS: 1.0000 | ORAL_TABLET | ORAL | Status: DC | PRN

## 2015-08-25 NOTE — Patient Instructions (Addendum)
[place] at [facility] Discharge Instructions  RECOMMENDATIONS MADE BY THE CONSULTANT AND ANY TEST RESULTS WILL BE SENT TO YOUR REFERRING PHYSICIAN.   Exam completed by Dr Whitney Muse today Restart chemotherapy tomorrow, every 21 days with neulasta OBI  3 more cycles Pain medication prescription given Steroid prescription given Follow with the doctor in 3 weeks Please call the clinic if you have any questions or concerns   Thank you for choosing [place] at [facility] to provide your oncology and hematology care.  To afford each patient quality time with our provider, please arrive at least 15 minutes before your scheduled appointment time.    You need to re-schedule your appointment should you arrive 10 or more minutes late.  We strive to give you quality time with our providers, and arriving late affects you and other patients whose appointments are after yours.  Also, if you no show three or more times for appointments you may be dismissed from the clinic at the providers discretion.     Again, thank you for choosing [facility].  Our hope is that these requests will decrease the amount of time that you wait before being seen by our physicians.       _____________________________________________________________  Should you have questions after your visit to [facility], please contact our office at [phone] between the hours of 8:30 a.m. and 4:30 p.m.  Voicemails left after 4:30 p.m. will not be returned until the following business day.  For prescription refill requests, have your pharmacy contact our office.

## 2015-08-25 NOTE — Progress Notes (Signed)
Latasha [name] presented for labwork. Labs per MD order drawn via Peripheral Line 23 gauge needle inserted in left AC  Good blood return present. Procedure without incident.  Needle removed intact. Patient tolerated procedure well.

## 2015-08-26 ENCOUNTER — Encounter (HOSPITAL_BASED_OUTPATIENT_CLINIC_OR_DEPARTMENT_OTHER): Payer: Medicare Other

## 2015-08-26 VITALS — BP 143/63 | HR 92 | Temp 98.2°F | Resp 16 | Wt 132.4 lb

## 2015-08-26 DIAGNOSIS — C569 Malignant neoplasm of unspecified ovary: Secondary | ICD-10-CM | POA: Diagnosis not present

## 2015-08-26 DIAGNOSIS — Z5189 Encounter for other specified aftercare: Secondary | ICD-10-CM

## 2015-08-26 DIAGNOSIS — Z5111 Encounter for antineoplastic chemotherapy: Secondary | ICD-10-CM

## 2015-08-26 DIAGNOSIS — D630 Anemia in neoplastic disease: Secondary | ICD-10-CM

## 2015-08-26 LAB — CBC WITH DIFFERENTIAL/PLATELET
BASOS ABS: 0 10*3/uL (ref 0.0–0.1)
Basophils Relative: 0 %
EOS ABS: 0 10*3/uL (ref 0.0–0.7)
EOS PCT: 0 %
HCT: 25.5 % — ABNORMAL LOW (ref 36.0–46.0)
Hemoglobin: 8.5 g/dL — ABNORMAL LOW (ref 12.0–15.0)
LYMPHS PCT: 7 %
Lymphs Abs: 0.7 10*3/uL (ref 0.7–4.0)
MCH: 33.6 pg (ref 26.0–34.0)
MCHC: 33.3 g/dL (ref 30.0–36.0)
MCV: 100.8 fL — AB (ref 78.0–100.0)
MONO ABS: 0.5 10*3/uL (ref 0.1–1.0)
Monocytes Relative: 5 %
Neutro Abs: 8.9 10*3/uL — ABNORMAL HIGH (ref 1.7–7.7)
Neutrophils Relative %: 88 %
PLATELETS: 381 10*3/uL (ref 150–400)
RBC: 2.53 MIL/uL — ABNORMAL LOW (ref 3.87–5.11)
RDW: 16.2 % — AB (ref 11.5–15.5)
WBC: 10.1 10*3/uL (ref 4.0–10.5)

## 2015-08-26 LAB — RETICULOCYTES
RBC.: 2.53 MIL/uL — AB (ref 3.87–5.11)
Retic Count, Absolute: 63.3 10*3/uL (ref 19.0–186.0)
Retic Ct Pct: 2.5 % (ref 0.4–3.1)

## 2015-08-26 LAB — TSH: TSH: 0.887 u[IU]/mL (ref 0.350–4.500)

## 2015-08-26 LAB — COMPREHENSIVE METABOLIC PANEL
ALT: 16 U/L (ref 14–54)
ANION GAP: 5 (ref 5–15)
AST: 21 U/L (ref 15–41)
Albumin: 4.2 g/dL (ref 3.5–5.0)
Alkaline Phosphatase: 49 U/L (ref 38–126)
BUN: 23 mg/dL — ABNORMAL HIGH (ref 6–20)
CALCIUM: 9.7 mg/dL (ref 8.9–10.3)
CHLORIDE: 109 mmol/L (ref 101–111)
CO2: 24 mmol/L (ref 22–32)
Creatinine, Ser: 1.07 mg/dL — ABNORMAL HIGH (ref 0.44–1.00)
GFR calc non Af Amer: 51 mL/min — ABNORMAL LOW (ref >60)
GFR, EST AFRICAN AMERICAN: 59 mL/min — AB (ref >60)
Glucose, Bld: 131 mg/dL — ABNORMAL HIGH (ref 65–99)
Potassium: 4.2 mmol/L (ref 3.5–5.1)
SODIUM: 138 mmol/L (ref 135–145)
TOTAL PROTEIN: 7.3 g/dL (ref 6.5–8.1)
Total Bilirubin: 0.5 mg/dL (ref 0.3–1.2)

## 2015-08-26 LAB — LACTATE DEHYDROGENASE: LDH: 150 U/L (ref 98–192)

## 2015-08-26 MED ORDER — PEGFILGRASTIM 6 MG/0.6ML ~~LOC~~ PSKT
6.0000 mg | PREFILLED_SYRINGE | Freq: Once | SUBCUTANEOUS | Status: AC
  Administered 2015-08-26: 6 mg via SUBCUTANEOUS

## 2015-08-26 MED ORDER — SODIUM CHLORIDE 0.9 % IJ SOLN
10.0000 mL | INTRAMUSCULAR | Status: DC | PRN
  Administered 2015-08-26: 10 mL
  Filled 2015-08-26: qty 10

## 2015-08-26 MED ORDER — SODIUM CHLORIDE 0.9 % IV SOLN
Freq: Once | INTRAVENOUS | Status: AC
  Administered 2015-08-26: 11:00:00 via INTRAVENOUS
  Filled 2015-08-26: qty 8

## 2015-08-26 MED ORDER — SODIUM CHLORIDE 0.9 % IV SOLN
385.5000 mg | Freq: Once | INTRAVENOUS | Status: AC
  Administered 2015-08-26: 390 mg via INTRAVENOUS
  Filled 2015-08-26: qty 39

## 2015-08-26 MED ORDER — HEPARIN SOD (PORK) LOCK FLUSH 100 UNIT/ML IV SOLN
500.0000 [IU] | Freq: Once | INTRAVENOUS | Status: AC | PRN
  Administered 2015-08-26: 500 [IU]
  Filled 2015-08-26: qty 5

## 2015-08-26 MED ORDER — FAMOTIDINE IN NACL 20-0.9 MG/50ML-% IV SOLN
INTRAVENOUS | Status: AC
  Filled 2015-08-26: qty 50

## 2015-08-26 MED ORDER — PEGFILGRASTIM 6 MG/0.6ML ~~LOC~~ PSKT
PREFILLED_SYRINGE | SUBCUTANEOUS | Status: AC
  Filled 2015-08-26: qty 0.6

## 2015-08-26 MED ORDER — PACLITAXEL CHEMO INJECTION 300 MG/50ML
175.0000 mg/m2 | Freq: Once | INTRAVENOUS | Status: AC
  Administered 2015-08-26: 300 mg via INTRAVENOUS
  Filled 2015-08-26: qty 50

## 2015-08-26 MED ORDER — FAMOTIDINE IN NACL 20-0.9 MG/50ML-% IV SOLN
20.0000 mg | Freq: Once | INTRAVENOUS | Status: AC
  Administered 2015-08-26: 20 mg via INTRAVENOUS

## 2015-08-26 MED ORDER — DIPHENHYDRAMINE HCL 50 MG/ML IJ SOLN
INTRAMUSCULAR | Status: AC
  Filled 2015-08-26: qty 1

## 2015-08-26 MED ORDER — SODIUM CHLORIDE 0.9 % IV SOLN
Freq: Once | INTRAVENOUS | Status: AC
  Administered 2015-08-26: 10:00:00 via INTRAVENOUS

## 2015-08-26 MED ORDER — DIPHENHYDRAMINE HCL 50 MG/ML IJ SOLN
50.0000 mg | Freq: Once | INTRAMUSCULAR | Status: AC
  Administered 2015-08-26: 50 mg via INTRAVENOUS

## 2015-08-26 NOTE — Progress Notes (Signed)
1510:  Tolerated tx w/o adverse reaction.  VSS.  A&Ox4, in no distress.  Neulasta OBI placement per MD orders.  OBI device filled per protocol and placed on left Upper Arm.  Needle/catheter placement noted prior to patient leaving. Tolerated without incident and aware of injection to be delivered in 27 hours.

## 2015-08-26 NOTE — Patient Instructions (Signed)
[facility] Discharge Instructions for Patients Receiving Chemotherapy  Today you received the following chemotherapy agents:  Taxol and carboplatin. Neulasta on-body injector placed.   If you develop nausea and vomiting, or diarrhea that is not controlled by your medication, call the clinic.  The clinic phone number is [phone]. Office hours are Monday-Friday 8:30am-5:00pm.  BELOW ARE SYMPTOMS THAT SHOULD BE REPORTED IMMEDIATELY:  *FEVER GREATER THAN 101.0 F  *CHILLS WITH OR WITHOUT FEVER  NAUSEA AND VOMITING THAT IS NOT CONTROLLED WITH YOUR NAUSEA MEDICATION  *UNUSUAL SHORTNESS OF BREATH  *UNUSUAL BRUISING OR BLEEDING  TENDERNESS IN MOUTH AND THROAT WITH OR WITHOUT PRESENCE OF ULCERS  *URINARY PROBLEMS  *BOWEL PROBLEMS  UNUSUAL RASH Items with * indicate a potential emergency and should be followed up as soon as possible. If you have an emergency after office hours please contact your primary care physician or go to the nearest emergency department.  Please call the clinic during office hours if you have any questions or concerns.   You may also contact the Patient Navigator at [phone] should you have any questions or need assistance in obtaining follow up care.

## 2015-08-27 LAB — PROTEIN ELECTROPHORESIS, SERUM
A/G RATIO SPE: 1.3 (ref 0.7–1.7)
ALBUMIN ELP: 4 g/dL (ref 2.9–4.4)
ALPHA-1-GLOBULIN: 0.2 g/dL (ref 0.0–0.4)
Alpha-2-Globulin: 0.7 g/dL (ref 0.4–1.0)
Beta Globulin: 1 g/dL (ref 0.7–1.3)
GLOBULIN, TOTAL: 3.1 g/dL (ref 2.2–3.9)
Gamma Globulin: 1.2 g/dL (ref 0.4–1.8)
TOTAL PROTEIN ELP: 7.1 g/dL (ref 6.0–8.5)

## 2015-08-27 LAB — HAPTOGLOBIN: HAPTOGLOBIN: 158 mg/dL (ref 34–200)

## 2015-08-27 LAB — CA 125: CA 125: 17.8 U/mL (ref 0.0–38.1)

## 2015-08-27 LAB — ERYTHROPOIETIN: ERYTHROPOIETIN: 10 m[IU]/mL (ref 2.6–18.5)

## 2015-08-28 LAB — IMMUNOFIXATION ELECTROPHORESIS
IGG (IMMUNOGLOBIN G), SERUM: 1174 mg/dL (ref 700–1600)
IGM, SERUM: 91 mg/dL (ref 26–217)
IgA: 229 mg/dL (ref 64–422)
TOTAL PROTEIN ELP: 7.1 g/dL (ref 6.0–8.5)

## 2015-09-02 ENCOUNTER — Inpatient Hospital Stay (HOSPITAL_COMMUNITY): Payer: Medicare Other

## 2015-09-16 ENCOUNTER — Encounter: Payer: Self-pay | Admitting: *Deleted

## 2015-09-16 ENCOUNTER — Encounter (HOSPITAL_COMMUNITY): Payer: Self-pay | Admitting: Oncology

## 2015-09-16 ENCOUNTER — Encounter (HOSPITAL_BASED_OUTPATIENT_CLINIC_OR_DEPARTMENT_OTHER): Payer: Medicare Other | Admitting: Oncology

## 2015-09-16 ENCOUNTER — Encounter (HOSPITAL_BASED_OUTPATIENT_CLINIC_OR_DEPARTMENT_OTHER): Payer: Medicare Other

## 2015-09-16 VITALS — BP 132/59 | HR 88 | Temp 98.0°F | Resp 16 | Wt 135.6 lb

## 2015-09-16 DIAGNOSIS — C569 Malignant neoplasm of unspecified ovary: Secondary | ICD-10-CM

## 2015-09-16 DIAGNOSIS — C562 Malignant neoplasm of left ovary: Secondary | ICD-10-CM | POA: Diagnosis present

## 2015-09-16 DIAGNOSIS — C786 Secondary malignant neoplasm of retroperitoneum and peritoneum: Secondary | ICD-10-CM

## 2015-09-16 DIAGNOSIS — D649 Anemia, unspecified: Secondary | ICD-10-CM | POA: Diagnosis not present

## 2015-09-16 DIAGNOSIS — C771 Secondary and unspecified malignant neoplasm of intrathoracic lymph nodes: Secondary | ICD-10-CM | POA: Diagnosis not present

## 2015-09-16 DIAGNOSIS — Z5111 Encounter for antineoplastic chemotherapy: Secondary | ICD-10-CM

## 2015-09-16 LAB — COMPREHENSIVE METABOLIC PANEL
ALBUMIN: 4.2 g/dL (ref 3.5–5.0)
ALK PHOS: 50 U/L (ref 38–126)
ALT: 16 U/L (ref 14–54)
AST: 20 U/L (ref 15–41)
Anion gap: 8 (ref 5–15)
BILIRUBIN TOTAL: 0.5 mg/dL (ref 0.3–1.2)
BUN: 30 mg/dL — AB (ref 6–20)
CALCIUM: 9.4 mg/dL (ref 8.9–10.3)
CO2: 23 mmol/L (ref 22–32)
CREATININE: 1.19 mg/dL — AB (ref 0.44–1.00)
Chloride: 107 mmol/L (ref 101–111)
GFR calc Af Amer: 52 mL/min — ABNORMAL LOW (ref >60)
GFR calc non Af Amer: 44 mL/min — ABNORMAL LOW (ref >60)
GLUCOSE: 147 mg/dL — AB (ref 65–99)
Potassium: 4.1 mmol/L (ref 3.5–5.1)
Sodium: 138 mmol/L (ref 135–145)
TOTAL PROTEIN: 7 g/dL (ref 6.5–8.1)

## 2015-09-16 LAB — CBC WITH DIFFERENTIAL/PLATELET
BASOS ABS: 0 10*3/uL (ref 0.0–0.1)
BASOS PCT: 0 %
Eosinophils Absolute: 0 10*3/uL (ref 0.0–0.7)
Eosinophils Relative: 0 %
HEMATOCRIT: 21.2 % — AB (ref 36.0–46.0)
HEMOGLOBIN: 7.2 g/dL — AB (ref 12.0–15.0)
LYMPHS PCT: 4 %
Lymphs Abs: 0.7 10*3/uL (ref 0.7–4.0)
MCH: 35 pg — ABNORMAL HIGH (ref 26.0–34.0)
MCHC: 34 g/dL (ref 30.0–36.0)
MCV: 102.9 fL — ABNORMAL HIGH (ref 78.0–100.0)
MONOS PCT: 5 %
Monocytes Absolute: 0.7 10*3/uL (ref 0.1–1.0)
NEUTROS ABS: 14.8 10*3/uL — AB (ref 1.7–7.7)
NEUTROS PCT: 91 %
Platelets: 332 10*3/uL (ref 150–400)
RBC: 2.06 MIL/uL — ABNORMAL LOW (ref 3.87–5.11)
RDW: 16.5 % — ABNORMAL HIGH (ref 11.5–15.5)
WBC: 16.2 10*3/uL — ABNORMAL HIGH (ref 4.0–10.5)

## 2015-09-16 MED ORDER — SODIUM CHLORIDE 0.9 % IV SOLN
Freq: Once | INTRAVENOUS | Status: AC
  Administered 2015-09-16: 10:00:00 via INTRAVENOUS
  Filled 2015-09-16: qty 8

## 2015-09-16 MED ORDER — SODIUM CHLORIDE 0.9 % IJ SOLN
10.0000 mL | INTRAMUSCULAR | Status: DC | PRN
  Administered 2015-09-16: 10 mL
  Filled 2015-09-16: qty 10

## 2015-09-16 MED ORDER — CARBOPLATIN CHEMO INJECTION 450 MG/45ML
359.0000 mg | Freq: Once | INTRAVENOUS | Status: AC
  Administered 2015-09-16: 360 mg via INTRAVENOUS
  Filled 2015-09-16: qty 36

## 2015-09-16 MED ORDER — DIPHENHYDRAMINE HCL 50 MG/ML IJ SOLN
50.0000 mg | Freq: Once | INTRAMUSCULAR | Status: AC
  Administered 2015-09-16: 50 mg via INTRAVENOUS
  Filled 2015-09-16: qty 1

## 2015-09-16 MED ORDER — FAMOTIDINE IN NACL 20-0.9 MG/50ML-% IV SOLN
20.0000 mg | Freq: Once | INTRAVENOUS | Status: AC
  Administered 2015-09-16: 20 mg via INTRAVENOUS
  Filled 2015-09-16: qty 50

## 2015-09-16 MED ORDER — PEGFILGRASTIM 6 MG/0.6ML ~~LOC~~ PSKT
6.0000 mg | PREFILLED_SYRINGE | Freq: Once | SUBCUTANEOUS | Status: AC
  Administered 2015-09-16: 6 mg via SUBCUTANEOUS
  Filled 2015-09-16: qty 0.6

## 2015-09-16 MED ORDER — HEPARIN SOD (PORK) LOCK FLUSH 100 UNIT/ML IV SOLN
500.0000 [IU] | Freq: Once | INTRAVENOUS | Status: AC | PRN
  Administered 2015-09-16: 500 [IU]
  Filled 2015-09-16: qty 5

## 2015-09-16 MED ORDER — SODIUM CHLORIDE 0.9 % IV SOLN
Freq: Once | INTRAVENOUS | Status: AC
  Administered 2015-09-16: 10:00:00 via INTRAVENOUS

## 2015-09-16 MED ORDER — PACLITAXEL CHEMO INJECTION 300 MG/50ML
175.0000 mg/m2 | Freq: Once | INTRAVENOUS | Status: AC
  Administered 2015-09-16: 312 mg via INTRAVENOUS
  Filled 2015-09-16: qty 52

## 2015-09-16 NOTE — Patient Instructions (Addendum)
[facility] Discharge Instructions for Patients Receiving Chemotherapy  Today you received the following chemotherapy agents Taxol and Carbo. Neulasta injector on left upper arm. Device will dispense medication between 6pm and 7pm tomorrow Thursday Dec 29th. You may safely remove device after 7pm tomorrow.  To help prevent nausea and vomiting after your treatment, we encourage you to take your nausea medication as instructed. If you develop nausea and vomiting that is not controlled by your nausea medication, call the clinic. If it is after clinic hours your family physician or the after hours number for the clinic or go to the Emergency Department. BELOW ARE SYMPTOMS THAT SHOULD BE REPORTED IMMEDIATELY:  *FEVER GREATER THAN 101.0 F  *CHILLS WITH OR WITHOUT FEVER  NAUSEA AND VOMITING THAT IS NOT CONTROLLED WITH YOUR NAUSEA MEDICATION  *UNUSUAL SHORTNESS OF BREATH  *UNUSUAL BRUISING OR BLEEDING  TENDERNESS IN MOUTH AND THROAT WITH OR WITHOUT PRESENCE OF ULCERS  *URINARY PROBLEMS  *BOWEL PROBLEMS  UNUSUAL RASH Items with * indicate a potential emergency and should be followed up as soon as possible.  Stool for Occult Blood Test WHY AM I HAVING THIS TEST? Stool for occult blood, or fecal occult blood test (FOBT), is a test that is used to screen for gastrointestinal (GI) bleeding, which may be an indicator of colon cancer. This test can also detect small amounts of blood in your stool (feces) from other causes, such as ulcers or hemorrhoids. This test is usually done as part of an annual routine examination after age 71. WHAT KIND OF SAMPLE IS TAKEN? A sample of your stool is required for this test. Your health care provider may collect the sample with a swab of the rectum. Or, you may be instructed to collect the sample in a container at home. If you are instructed to collect the sample, your health care provider will provide you with the instructions and the supplies  that you will need to do that. WILL I NEED TO COLLECT SAMPLES AT HOME?  A stool sample may need to be collected at home. When collecting a sample at home, make sure that you:  Use the sterile containers and other supplies that were given to you from the lab.  Do not mix urine, toilet paper, or water with your sample.  Label all slides and containers with your name and the date when you collected the sample. Your health care provider or lab staff will give you one or more test "cards." You will collect a separate sample from three different stools, usually on different days that follow each other. Follow these steps for each sample: 2. Collect a stool sample into a clean container. 3. With an applicator stick, apply a thin smear of stool sample onto each filter paper square or window that is on the card. 4. Allow the filter paper to dry. After it is dry, the sample will be stable. Usually, you will return all of the samples to your health care provider or lab at the same time.  HOW DO I PREPARE FOR THE TEST?  Do not eat any red meat within three days before testing.  Follow your health care provider's instructions about eating and drinking prior to the test. Your health care provider may instruct you to avoid other foods or substances.  Ask your health care provider about taking or not taking your medicines prior to the test. You may be instructed to avoid certain medicines that are known to interfere with this test. HOW  ARE THE TEST RESULTS REPORTED? Your test results will be reported as either positive or negative. It is your responsibility to obtain your test results. Ask the lab or department performing the test when and how you will get your results. WHAT DO THE RESULTS MEAN? A negative test result means that there is no occult blood within the stool. A negative result is normal. A positive test result may mean that there is blood in the stool. Causes of blood in the stool include:  GI  tumors.  Certain GI diseases.  GI trauma or recent surgery.  Hemorrhoids. Talk with your health care provider to discuss your results, treatment options, and if necessary, the need for more tests. Talk with your health care provider if you have any questions about your results.   This information is not intended to replace advice given to you by your health care provider. Make sure you discuss any questions you have with your health care provider.   Return tomorrow as scheduled for blood transfusion.  I have been informed and understand all the instructions given to me. I know to contact the clinic, my physician, or go to the Emergency Department if any problems should occur. I do not have any questions at this time, but understand that I may call the clinic during office hours or the Patient Navigator at [phone] should I have any questions or need assistance in obtaining follow up care.    __________________________________________  _____________  __________ Signature of Patient or Authorized Representative            Date                   Time    __________________________________________ Nurse's Signature

## 2015-09-16 NOTE — Patient Instructions (Signed)
[place] at [facility] Discharge Instructions  RECOMMENDATIONS MADE BY THE CONSULTANT AND ANY TEST RESULTS WILL BE SENT TO YOUR REFERRING PHYSICIAN.  Exam and discussion by Robynn Pane, PA-C Will treat today and will give you some blood tomorrow.  Bring in the stool cards once all 3 have been collected Report fevers, uncontrolled nausea, vomiting or other concerns.  Follow-up: See Schedule.  Thank you for choosing [place] at [facility] to provide your oncology and hematology care.  To afford each patient quality time with our provider, please arrive at least 15 minutes before your scheduled appointment time.    You need to re-schedule your appointment should you arrive 10 or more minutes late.  We strive to give you quality time with our providers, and arriving late affects you and other patients whose appointments are after yours.  Also, if you no show three or more times for appointments you may be dismissed from the clinic at the providers discretion.     Again, thank you for choosing [facility].  Our hope is that these requests will decrease the amount of time that you wait before being seen by our physicians.       _____________________________________________________________  Should you have questions after your visit to [facility], please contact our office at [phone] between the hours of 8:30 a.m. and 4:30 p.m.  Voicemails left after 4:30 p.m. will not be returned until the following business day.  For prescription refill requests, have your pharmacy contact our office.

## 2015-09-16 NOTE — Progress Notes (Signed)
No PCP Per Patient No address on file  Ovarian cancer, left ([place]) - Plan: CBC with Differential, Comprehensive metabolic panel, CA 277  Normocytic anemia - Plan: Occult blood card to lab, stool, Occult blood card to lab, stool, Occult blood card to lab, stool, Vitamin B12, Folate, Iron and TIBC, Ferritin, Erythropoietin, Reticulocytes  CURRENT THERAPY: Carboplatin/Paclitaxel every 21 days  INTERVAL HISTORY: Latasha [name] 74 y.o. 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    Ovarian cancer ([place])   04/25/2015 - 04/28/2015 Hospital Admission Nausea/diarrhea.  Oncology consult completed on 04/27/2015.   04/25/2015 Tumor Marker CA 125- 3902.      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   04/27/2015 Procedure US Paracentesis- A total of approximately 3700 mL of amber colored fluid was removed. A fluid sample was sent for laboratory analysis.   04/27/2015 Imaging CT Chest- Mild left supraclavicular lymphadenopathy and moderate mediastinal lymphadenopathy involving the prevascular, subcarinal and bilateral pericardiophrenic nodal chains, likely metastatic.   04/27/2015 Pathology Results Diagnosis PERITONEAL/ASCITIC FLUID(SPECIMEN 1 OF 1 COLLECTED 04/27/15): MALIGNANT CELLS CONSISTENT WITH METASTATIC ADENOCARCINOMA.  The immunophenotype is most consistent with a gynecologic primary, most likely ovary.   05/08/2015 Miscellaneous Seen by Dr. Denman George- recommending Carboplatin/Paclitaxel x 3 cycles and return visit to see her (within 1 week of administration of third cycle) to evaluate for optimal sequencing of treatment modalities.   05/13/2015 - 05/15/2015 Hospital Admission Hospitalized for AKI   05/19/2015 -  07/01/2015 Chemotherapy Carboplatin/Paclitaxel x 3 cycles   07/28/2015 Surgery Exploratory laparotomy with total abdominal hysterectomy, bilateral salpingo-oophorectomy, omentectomy radical tumor debulking for ovarian cancer; by Dr. Everitt Amber.   07/28/2015 Pathology Results Uterus +/- tubes/ovaries, neoplastic, cervix - HIGH GRADE SEROUS CARCINOMA INVOLVING LEFT OVARY, LEFT FALLOPIAN TUBE AND RIGHT FALLOPIAN TUBE. - CERVIX, ENDOMETRIUM AND MYOMETRIUM ARE FREE OF TUMOR. 2. Cul-de-sac biop...   08/26/2015 -  Chemotherapy Carboplatin/Paclitaxel x 3 cycles    I personally reviewed and went over laboratory results with the patient.  The results are noted within this dictation.  I reviewed her Hgb with the patient and her downward trend.  Previously, she underwent a peripheral work-up for anemia in September that was unimpressive.  We'll repeat this today or tomorrow to update results. I discussed blood transfusion with the patient.  We reviewed the risks, benefits, alternatives, and side effects of PRBC transfusion.  She is asymptomatic at this time.  She is agreeable to a transfusion and we will pursue this tomorrow.  She denies any blood in her stool and black tarry stool.  She otherwise is doing well.  She denies any complaints and ROS questioning is negative.  Past Medical History  Diagnosis Date  . Hypertension   . Schizophrenia ([place])   . 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 Ulcerative colitis ([place])   . Mixed hyperlipidemia   . Vitamin D deficiency   . B12 deficiency   . Iron deficiency anemia   . Ovarian cancer ([place])   . Cataract   . Arthritis   . History of blood transfusion   . History of chemotherapy     has Renal failure; Nausea without vomiting; Ascites, malignant; Ovarian cancer ([place]); Omental mass; Acute kidney injury ([place]); UTI (lower urinary tract infection); Normocytic anemia;  IBD (inflammatory bowel disease); Schizophrenia, unspecified type ([place]); Diarrhea; Malnutrition of moderate  degree ([place]); Acute renal failure ([place]); and Ileus, postoperative on her problem list.     has No Known Allergies.  Current Outpatient Prescriptions on File Prior to Visit  Medication Sig Dispense Refill  . acetaminophen (TYLENOL) 500 MG tablet Take 2 tablets (1,000 mg total) by mouth every 12 (twelve) hours. 30 tablet 0  . amLODipine (NORVASC) 10 MG tablet Take 10 mg by mouth every evening.     Marland Kitchen aspirin EC 81 MG tablet Take 81 mg by mouth daily.    Marland Kitchen CARBOPLATIN IV Inject into the vein every 21 ( twenty-one) days. To start 05/13/15    . Cholecalciferol (VITAMIN D-3) 1000 UNITS CAPS Take 1,000 Units by mouth daily.     Marland Kitchen dexamethasone (DECADRON) 4 MG tablet The day before chemo take 5 tablets (46m) in the am and 5 tablets (251m in the pm. The morning of chemo take 5 tablets (2091m 30 tablet 1  . enoxaparin (LOVENOX) 40 MG/0.4ML injection Inject 0.4 mLs (40 mg total) into the skin daily. 24 Syringe 0  . ferrous sulfate 325 (65 FE) MG EC tablet Take 325 mg by mouth 2 (two) times daily.    . folic acid (FOLVITE) 1 MG tablet Take 1 mg by mouth daily.    . lMarland Kitchendocaine-prilocaine (EMLA) cream Apply a quarter size amount to port site 1 hour prior to chemo. Do not rub in. Cover with plastic wrap. 30 g 3  . lovastatin (MEVACOR) 10 MG tablet Take 10 mg by mouth at bedtime.    . megestrol (MEGACE) 400 MG/10ML suspension Take 10 mLs (400 mg total) by mouth daily. 240 mL 0  . metoprolol tartrate (LOPRESSOR) 25 MG tablet Take 1 tablet (25 mg total) by mouth 2 (two) times daily. 60 tablet 1  . mirtazapine (REMERON) 15 MG tablet Take 7.5 mg by mouth at bedtime.    . Multiple Vitamin (MULTIVITAMIN WITH MINERALS) TABS tablet Take 1 tablet by mouth daily.    . ondansetron (ZOFRAN) 4 MG tablet Take 1 tablet (4 mg total) by mouth every 6 (six) hours as needed for nausea. 20 tablet 0  . ondansetron (ZOFRAN) 8 MG tablet Take 1 tablet (8 mg total) by mouth every 8 (eight) hours as needed for nausea or vomiting. 30  tablet 2  . oxyCODONE-acetaminophen (PERCOCET/ROXICET) 5-325 MG tablet Take 1 tablet by mouth every 4 (four) hours as needed for severe pain. 60 tablet 0  . PACLitaxel (TAXOL IV) Inject into the vein every 21 ( twenty-one) days. To start 05/13/15    . Pegfilgrastim (NEULASTA ONPRO [place]) Inject into the skin every 21 ( twenty-one) days. To be applied to skin at the end of chemo.    . prochlorperazine (COMPAZINE) 10 MG tablet Take 1 tablet (10 mg total) by mouth every 6 (six) hours as needed (Nausea or vomiting). 30 tablet 1  . QUEtiapine (SEROQUEL XR) 300 MG 24 hr tablet Take 300 mg by mouth at bedtime.    . sulfaSALAzine (AZULFIDINE) 500 MG tablet Take 500 mg by mouth 3 (three) times daily.    . vitamin B-12 (CYANOCOBALAMIN) 1000 MCG tablet Take 1,000 mcg by mouth daily.    . traMADol (ULTRAM) 50 MG tablet Take 1 tablet (50 mg total) by mouth every 6 (six) hours as needed. (Patient not taking: Reported on 08/25/2015) 30 tablet 1   Current Facility-Administered Medications on File Prior to Visit  Medication Dose Route Frequency  Provider Last Rate Last Dose  . CARBOplatin (PARAPLATIN) 360 mg in sodium chloride 0.9 % 100 mL chemo infusion  360 mg Intravenous Once Patrici Ranks, MD      . heparin lock flush 100 unit/mL  500 Units Intracatheter Once PRN Patrici Ranks, MD      . PACLitaxel (TAXOL) 312 mg in dextrose 5 % 500 mL chemo infusion (> 43m/m2)  175 mg/m2 (Treatment Plan Actual) Intravenous Once SPatrici Ranks MD 184 mL/hr at 09/16/15 1045 312 mg at 09/16/15 1045  . pegfilgrastim (NEULASTA ONPRO KIT) injection 6 mg  6 mg Subcutaneous Once SPatrici Ranks MD      . sodium chloride 0.9 % injection 10 mL  10 mL Intracatheter PRN SPatrici Ranks MD   10 mL at 09/16/15 1010    Past Surgical History  Procedure Laterality Date  . Replacement total knee      right knee in 2003  . Paracentesis  04/27/15  . Portacath placement Right 04/2015  . Laparotomy N/A 07/28/2015    Procedure:  EXPLORATORY LAPAROTOMY;  Surgeon: EEveritt Amber MD;  Location: [facility];  Service: Gynecology;  Laterality: N/A;  . Abdominal hysterectomy N/A 07/28/2015    Procedure: TOTAL HYSTERECTOMY ABDOMINAL BILATERAL SALPINGO OOPHRORECTOMY;  Surgeon: EEveritt Amber MD;  Location: [facility];  Service: Gynecology;  Laterality: N/A;  . Omentectomy N/A 07/28/2015    Procedure: OMENTECTOMY ;  Surgeon: EEveritt Amber MD;  Location: [facility];  Service: Gynecology;  Laterality: N/A;  . Debulking N/A 07/28/2015    Procedure: DEBULKING;  Surgeon: EEveritt Amber MD;  Location: [facility];  Service: Gynecology;  Laterality: N/A;    Denies any headaches, dizziness, double vision, fevers, chills, night sweats, nausea, vomiting, diarrhea, constipation, chest pain, heart palpitations, shortness of breath, blood in stool, black tarry stool, urinary pain, urinary burning, urinary frequency, hematuria.   PHYSICAL EXAMINATION  ECOG PERFORMANCE STATUS: 1 - Symptomatic but completely ambulatory  There were no vitals filed for this visit.  GENERAL:alert, no distress, well nourished, well developed, comfortable, cooperative, smiling and unaccompanied today in a chemo-recliner receiving chemotherapy. SKIN: skin color, texture, turgor are normal, no rashes or significant lesions HEAD: Normocephalic, No masses, lesions, tenderness or abnormalities EYES: normal, EOMI, Conjunctiva are pink and non-injected EARS: External ears normal OROPHARYNX:lips, buccal mucosa, and tongue normal and mucous membranes are moist  NECK: supple, no adenopathy, trachea midline LYMPH:  no palpable lymphadenopathy BREAST:not examined LUNGS: clear to auscultation and percussion HEART: regular rate & rhythm, no murmurs, no gallops, S1 normal and S2 normal ABDOMEN:abdomen soft, non-tender and normal bowel sounds BACK: Back symmetric, no curvature., No CVA tenderness EXTREMITIES:less then 2 second capillary refill, no joint deformities, effusion, or inflammation, no edema,  no skin discoloration, no clubbing, no cyanosis  NEURO: alert & oriented x 3 with fluent speech, no focal motor/sensory deficits   LABORATORY DATA: CBC    Component Value Date/Time   WBC 16.2* 09/16/2015 0832   RBC 2.06* 09/16/2015 0832   RBC 2.53* 08/26/2015 0905   HGB 7.2* 09/16/2015 0832   HCT 21.2* 09/16/2015 0832   PLT 332 09/16/2015 0832   MCV 102.9* 09/16/2015 0832   MCH 35.0* 09/16/2015 0832   MCHC 34.0 09/16/2015 0832   RDW 16.5* 09/16/2015 0832   LYMPHSABS 0.7 09/16/2015 0832   MONOABS 0.7 09/16/2015 0832   EOSABS 0.0 09/16/2015 0832   BASOSABS 0.0 09/16/2015 0832      Chemistry      Component Value Date/Time  NA 138 09/16/2015 0832   K 4.1 09/16/2015 0832   CL 107 09/16/2015 0832   CO2 23 09/16/2015 0832   BUN 30* 09/16/2015 0832   CREATININE 1.19* 09/16/2015 0832      Component Value Date/Time   CALCIUM 9.4 09/16/2015 0832   ALKPHOS 50 09/16/2015 0832   AST 20 09/16/2015 0832   ALT 16 09/16/2015 0832   BILITOT 0.5 09/16/2015 0832        PENDING LABS:   RADIOGRAPHIC STUDIES:  No results found.   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  She is S/P 3 cycles of Carboplatin/Paclitaxel with an excellent clinical response to systemic therapy.  She then underwent radical debulking surgery with Dr. Everitt Amber on 07/28/2015.  Currently, she is undergoing systemic chemotherapy with Carboplatin/Paclitaxel with the goal of finishing 6 cycles in total of systemic chemotherapy.  Oncology history is updated.  Staging in [facility] problem list is updated with most current information.  Pre-treatment labs today as ordered: CBC diff, CMET, CA 125.  She meets treatment parameters today.  Anemia progresses.  I have added labs: anemia panel, retic, and EPO.  This can be done today or tomorrow  prior to transfusion.  I have ordered stool cards x 3 as well.  She denies any blood in her stool.  With her HGB being 7.2 g/dL today, I have offered the patient a 2 unit PRBC.  I reviewed the risks, benefits, alternatives, and side effects including anaphylaxis and death.  She has no religious objection to this intervention.  She is agreeable to have a 2 unit PRBC transfusion and this is scheduled for tomorrow.  Orders are placed and she will be pre-medicated with Tylenol and Benadryl.  Return in 21 days for follow-up and next cycle of chemotherapy.     THERAPY PLAN:  Continue with treatment as planned to complete 3 cycles following radical debulking surgery.  All questions were answered. The patient knows to call the clinic with any problems, questions or concerns. We can certainly see the patient much sooner if necessary.  Patient and plan discussed with Dr. Ancil Linsey and she is in agreement with the aforementioned.   This note is electronically signed by: Latasha Pane, PA-C 09/16/2015 12:00 PM

## 2015-09-16 NOTE — Assessment & Plan Note (Addendum)
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  She is S/P 3 cycles of Carboplatin/Paclitaxel with an excellent clinical response to systemic therapy.  She then underwent radical debulking surgery with Dr. Everitt Amber on 07/28/2015.  Currently, she is undergoing systemic chemotherapy with Carboplatin/Paclitaxel with the goal of finishing 6 cycles in total of systemic chemotherapy.  Oncology history is updated.  Staging in [facility] problem list is updated with most current information.  Pre-treatment labs today as ordered: CBC diff, CMET, CA 125.  She meets treatment parameters today.  Anemia progresses.  I have added labs: anemia panel, retic, and EPO.  This can be done today or tomorrow prior to transfusion.  I have ordered stool cards x 3 as well.  She denies any blood in her stool.  With her HGB being 7.2 g/dL today, I have offered the patient a 2 unit PRBC.  I reviewed the risks, benefits, alternatives, and side effects including anaphylaxis and death.  She has no religious objection to this intervention.  She is agreeable to have a 2 unit PRBC transfusion and this is scheduled for tomorrow.  Orders are placed and she will be pre-medicated with Tylenol and Benadryl.  Return in 21 days for follow-up and next cycle of chemotherapy.

## 2015-09-16 NOTE — Progress Notes (Signed)
[place] Clinical Social Work  Clinical Social Work was referred by [place] rounding for re-assessment of psychosocial needs due to possible financial/resource needs. Clinical Social Worker met with patient at [facility] during chemo to offer support and assess for needs.  CSW reviewed additional resources that could assist such as [place], [place] [place] and support group. CSW provided her with handouts and contact information. Pt denied issues or current needs with transportation. Pt reports she will consider support group. Pt aware to reach out as needed.   Clinical Social Work interventions: Resource education Supportive listening  [place], [place] Tuesdays   [phone]

## 2015-09-16 NOTE — Progress Notes (Signed)
Tolerated chemo well. Latasha KitchenLarina [name] arrived today for ONPRO neulasta on body injector. See MAR for administration details. Injector in place and engaged with green light indicator on flashing. Tolerated application with out problems. Ambulatory on discharge home with son. Patient to return to clinic tomorrow for blood transfusion.

## 2015-09-17 ENCOUNTER — Encounter (HOSPITAL_BASED_OUTPATIENT_CLINIC_OR_DEPARTMENT_OTHER): Payer: Medicare Other

## 2015-09-17 ENCOUNTER — Encounter (HOSPITAL_COMMUNITY): Payer: Self-pay

## 2015-09-17 VITALS — BP 125/59 | HR 78 | Temp 97.9°F | Resp 18

## 2015-09-17 DIAGNOSIS — D649 Anemia, unspecified: Secondary | ICD-10-CM

## 2015-09-17 DIAGNOSIS — C562 Malignant neoplasm of left ovary: Secondary | ICD-10-CM

## 2015-09-17 DIAGNOSIS — C569 Malignant neoplasm of unspecified ovary: Secondary | ICD-10-CM | POA: Diagnosis not present

## 2015-09-17 LAB — CA 125: CA 125: 11 U/mL (ref 0.0–38.1)

## 2015-09-17 LAB — PREPARE RBC (CROSSMATCH)

## 2015-09-17 MED ORDER — SODIUM CHLORIDE 0.9 % IV SOLN
250.0000 mL | Freq: Once | INTRAVENOUS | Status: AC
  Administered 2015-09-17: 250 mL via INTRAVENOUS

## 2015-09-17 MED ORDER — ACETAMINOPHEN 325 MG PO TABS
650.0000 mg | ORAL_TABLET | Freq: Once | ORAL | Status: AC
  Administered 2015-09-17: 650 mg via ORAL

## 2015-09-17 MED ORDER — HEPARIN SOD (PORK) LOCK FLUSH 100 UNIT/ML IV SOLN
500.0000 [IU] | Freq: Every day | INTRAVENOUS | Status: AC | PRN
  Administered 2015-09-17: 500 [IU]
  Filled 2015-09-17: qty 5

## 2015-09-17 MED ORDER — DIPHENHYDRAMINE HCL 25 MG PO CAPS
25.0000 mg | ORAL_CAPSULE | Freq: Once | ORAL | Status: AC
  Administered 2015-09-17: 25 mg via ORAL

## 2015-09-17 MED ORDER — ACETAMINOPHEN 325 MG PO TABS
ORAL_TABLET | ORAL | Status: AC
Start: 2015-09-17 — End: 2015-09-17
  Filled 2015-09-17: qty 2

## 2015-09-17 MED ORDER — DIPHENHYDRAMINE HCL 25 MG PO CAPS
ORAL_CAPSULE | ORAL | Status: AC
  Filled 2015-09-17: qty 1

## 2015-09-17 MED ORDER — SODIUM CHLORIDE 0.9 % IJ SOLN
10.0000 mL | INTRAMUSCULAR | Status: AC | PRN
  Administered 2015-09-17: 10 mL

## 2015-09-17 NOTE — Patient Instructions (Signed)
[place] at [facility] Discharge Instructions  RECOMMENDATIONS MADE BY THE CONSULTANT AND ANY TEST RESULTS WILL BE SENT TO YOUR REFERRING PHYSICIAN.  Today you received two units of blood. Return as scheduled for office visit and chemotherapy. Call the clinic should you have any questions or concerns prior to your next appointment.   Thank you for choosing [place] at [facility] to provide your oncology and hematology care.  To afford each patient quality time with our provider, please arrive at least 15 minutes before your scheduled appointment time.    You need to re-schedule your appointment should you arrive 10 or more minutes late.  We strive to give you quality time with our providers, and arriving late affects you and other patients whose appointments are after yours.  Also, if you no show three or more times for appointments you may be dismissed from the clinic at the providers discretion.     Again, thank you for choosing [facility].  Our hope is that these requests will decrease the amount of time that you wait before being seen by our physicians.       _____________________________________________________________  Should you have questions after your visit to [facility], please contact our office at [phone] between the hours of 8:30 a.m. and 4:30 p.m.  Voicemails left after 4:30 p.m. will not be returned until the following business day.  For prescription refill requests, have your pharmacy contact our office.

## 2015-09-17 NOTE — Progress Notes (Signed)
Tolerated transfusion w/o adverse reaction.  VSS.  A&Ox4, in no distress.  Discharged ambulatory in c/o family.

## 2015-09-18 LAB — TYPE AND SCREEN
ABO/RH(D): A POS
Antibody Screen: NEGATIVE
UNIT DIVISION: 0
Unit division: 0

## 2015-10-07 ENCOUNTER — Encounter (HOSPITAL_COMMUNITY): Payer: Medicare Other | Attending: Oncology

## 2015-10-07 ENCOUNTER — Ambulatory Visit (HOSPITAL_COMMUNITY): Payer: Medicare Other | Admitting: Oncology

## 2015-10-07 ENCOUNTER — Encounter (HOSPITAL_COMMUNITY): Payer: Self-pay | Admitting: Hematology & Oncology

## 2015-10-07 ENCOUNTER — Encounter (HOSPITAL_BASED_OUTPATIENT_CLINIC_OR_DEPARTMENT_OTHER): Payer: Medicare Other | Admitting: Hematology & Oncology

## 2015-10-07 VITALS — BP 139/74 | HR 97 | Temp 98.1°F | Resp 20

## 2015-10-07 VITALS — BP 145/70 | HR 94 | Temp 98.1°F | Resp 18 | Wt 134.6 lb

## 2015-10-07 DIAGNOSIS — Z79899 Other long term (current) drug therapy: Secondary | ICD-10-CM | POA: Insufficient documentation

## 2015-10-07 DIAGNOSIS — E559 Vitamin D deficiency, unspecified: Secondary | ICD-10-CM | POA: Diagnosis not present

## 2015-10-07 DIAGNOSIS — C771 Secondary and unspecified malignant neoplasm of intrathoracic lymph nodes: Secondary | ICD-10-CM | POA: Diagnosis not present

## 2015-10-07 DIAGNOSIS — I10 Essential (primary) hypertension: Secondary | ICD-10-CM | POA: Insufficient documentation

## 2015-10-07 DIAGNOSIS — Z7982 Long term (current) use of aspirin: Secondary | ICD-10-CM | POA: Insufficient documentation

## 2015-10-07 DIAGNOSIS — C562 Malignant neoplasm of left ovary: Secondary | ICD-10-CM | POA: Diagnosis present

## 2015-10-07 DIAGNOSIS — C786 Secondary malignant neoplasm of retroperitoneum and peritoneum: Secondary | ICD-10-CM

## 2015-10-07 DIAGNOSIS — Z5111 Encounter for antineoplastic chemotherapy: Secondary | ICD-10-CM

## 2015-10-07 DIAGNOSIS — D72829 Elevated white blood cell count, unspecified: Secondary | ICD-10-CM | POA: Diagnosis not present

## 2015-10-07 DIAGNOSIS — E538 Deficiency of other specified B group vitamins: Secondary | ICD-10-CM | POA: Diagnosis not present

## 2015-10-07 DIAGNOSIS — R180 Malignant ascites: Secondary | ICD-10-CM | POA: Diagnosis not present

## 2015-10-07 DIAGNOSIS — D649 Anemia, unspecified: Secondary | ICD-10-CM

## 2015-10-07 DIAGNOSIS — E782 Mixed hyperlipidemia: Secondary | ICD-10-CM | POA: Diagnosis not present

## 2015-10-07 DIAGNOSIS — C569 Malignant neoplasm of unspecified ovary: Secondary | ICD-10-CM | POA: Insufficient documentation

## 2015-10-07 DIAGNOSIS — C7989 Secondary malignant neoplasm of other specified sites: Secondary | ICD-10-CM

## 2015-10-07 LAB — CBC WITH DIFFERENTIAL/PLATELET
BASOS ABS: 0 10*3/uL (ref 0.0–0.1)
BASOS PCT: 0 %
EOS ABS: 0 10*3/uL (ref 0.0–0.7)
Eosinophils Relative: 0 %
HEMATOCRIT: 24 % — AB (ref 36.0–46.0)
Hemoglobin: 8 g/dL — ABNORMAL LOW (ref 12.0–15.0)
Lymphocytes Relative: 4 %
Lymphs Abs: 0.7 10*3/uL (ref 0.7–4.0)
MCH: 33.5 pg (ref 26.0–34.0)
MCHC: 33.3 g/dL (ref 30.0–36.0)
MCV: 100.4 fL — ABNORMAL HIGH (ref 78.0–100.0)
MONO ABS: 1.1 10*3/uL — AB (ref 0.1–1.0)
Monocytes Relative: 6 %
NEUTROS ABS: 16 10*3/uL — AB (ref 1.7–7.7)
Neutrophils Relative %: 90 %
PLATELETS: 262 10*3/uL (ref 150–400)
RBC: 2.39 MIL/uL — ABNORMAL LOW (ref 3.87–5.11)
RDW: 19.8 % — AB (ref 11.5–15.5)
WBC: 17.9 10*3/uL — ABNORMAL HIGH (ref 4.0–10.5)

## 2015-10-07 LAB — COMPREHENSIVE METABOLIC PANEL
ALBUMIN: 4.2 g/dL (ref 3.5–5.0)
ALT: 20 U/L (ref 14–54)
AST: 23 U/L (ref 15–41)
Alkaline Phosphatase: 65 U/L (ref 38–126)
Anion gap: 8 (ref 5–15)
BILIRUBIN TOTAL: 0.6 mg/dL (ref 0.3–1.2)
BUN: 23 mg/dL — AB (ref 6–20)
CO2: 24 mmol/L (ref 22–32)
Calcium: 9.4 mg/dL (ref 8.9–10.3)
Chloride: 109 mmol/L (ref 101–111)
Creatinine, Ser: 1.04 mg/dL — ABNORMAL HIGH (ref 0.44–1.00)
GFR calc Af Amer: >60 mL/min (ref >60)
GFR calc non Af Amer: 52 mL/min — ABNORMAL LOW (ref >60)
GLUCOSE: 149 mg/dL — AB (ref 65–99)
POTASSIUM: 4 mmol/L (ref 3.5–5.1)
Sodium: 141 mmol/L (ref 135–145)
TOTAL PROTEIN: 6.6 g/dL (ref 6.5–8.1)

## 2015-10-07 LAB — OCCULT BLOOD X 1 CARD TO LAB, STOOL
Fecal Occult Bld: NEGATIVE
Fecal Occult Bld: NEGATIVE
Fecal Occult Bld: NEGATIVE

## 2015-10-07 MED ORDER — SODIUM CHLORIDE 0.9 % IV SOLN
Freq: Once | INTRAVENOUS | Status: AC
  Administered 2015-10-07: 10:00:00 via INTRAVENOUS

## 2015-10-07 MED ORDER — SODIUM CHLORIDE 0.9 % IV SOLN
393.0000 mg | Freq: Once | INTRAVENOUS | Status: AC
  Administered 2015-10-07: 390 mg via INTRAVENOUS
  Filled 2015-10-07: qty 39

## 2015-10-07 MED ORDER — PEGFILGRASTIM 6 MG/0.6ML ~~LOC~~ PSKT
6.0000 mg | PREFILLED_SYRINGE | Freq: Once | SUBCUTANEOUS | Status: AC
Start: 2015-10-07 — End: 2015-10-07
  Administered 2015-10-07: 6 mg via SUBCUTANEOUS
  Filled 2015-10-07: qty 0.6

## 2015-10-07 MED ORDER — FAMOTIDINE IN NACL 20-0.9 MG/50ML-% IV SOLN
20.0000 mg | Freq: Once | INTRAVENOUS | Status: AC
  Administered 2015-10-07: 20 mg via INTRAVENOUS
  Filled 2015-10-07: qty 50

## 2015-10-07 MED ORDER — HEPARIN SOD (PORK) LOCK FLUSH 100 UNIT/ML IV SOLN
500.0000 [IU] | Freq: Once | INTRAVENOUS | Status: AC | PRN
  Administered 2015-10-07: 500 [IU]
  Filled 2015-10-07: qty 5

## 2015-10-07 MED ORDER — DIPHENHYDRAMINE HCL 50 MG/ML IJ SOLN
50.0000 mg | Freq: Once | INTRAMUSCULAR | Status: AC
  Administered 2015-10-07: 50 mg via INTRAVENOUS
  Filled 2015-10-07: qty 1

## 2015-10-07 MED ORDER — SODIUM CHLORIDE 0.9 % IJ SOLN
10.0000 mL | INTRAMUSCULAR | Status: DC | PRN
  Administered 2015-10-07: 10 mL
  Filled 2015-10-07: qty 10

## 2015-10-07 MED ORDER — METOPROLOL TARTRATE 25 MG PO TABS: 25.0000 mg | ORAL_TABLET | Freq: Two times a day (BID) | ORAL | Status: DC

## 2015-10-07 MED ORDER — PACLITAXEL CHEMO INJECTION 300 MG/50ML
175.0000 mg/m2 | Freq: Once | INTRAVENOUS | Status: AC
  Administered 2015-10-07: 312 mg via INTRAVENOUS
  Filled 2015-10-07: qty 52

## 2015-10-07 MED ORDER — DEXAMETHASONE SODIUM PHOSPHATE 100 MG/10ML IJ SOLN
Freq: Once | INTRAMUSCULAR | Status: AC
  Administered 2015-10-07: 10:00:00 via INTRAVENOUS
  Filled 2015-10-07: qty 8

## 2015-10-07 NOTE — Progress Notes (Signed)
Tolerated chemo well. Neulasta OnPro body injector placed on left upper arm. Patient ambulatory on discharge home with self.

## 2015-10-07 NOTE — Progress Notes (Signed)
No PCP Per Patient No address on file    Ovarian cancer [facility])   04/25/2015 - 04/28/2015 Hospital Admission Nausea/diarrhea.  Oncology consult completed on 04/27/2015.   04/25/2015 Tumor Marker CA 125- 3902.      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   04/27/2015 Procedure US Paracentesis- A total of approximately 3700 mL of amber colored fluid was removed. A fluid sample was sent for laboratory analysis.   04/27/2015 Imaging CT Chest- Mild left supraclavicular lymphadenopathy and moderate mediastinal lymphadenopathy involving the prevascular, subcarinal and bilateral pericardiophrenic nodal chains, likely metastatic.   04/27/2015 Pathology Results Diagnosis PERITONEAL/ASCITIC FLUID(SPECIMEN 1 OF 1 COLLECTED 04/27/15): MALIGNANT CELLS CONSISTENT WITH METASTATIC ADENOCARCINOMA.  The immunophenotype is most consistent with a gynecologic primary, most likely ovary.   05/08/2015 Miscellaneous Seen by Dr. Denman George- recommending Carboplatin/Paclitaxel x 3 cycles and return visit to see her (within 1 week of administration of third cycle) to evaluate for optimal sequencing of treatment modalities.   05/13/2015 - 05/15/2015 Hospital Admission Hospitalized for AKI   05/19/2015 - 07/01/2015 Chemotherapy Carboplatin/Paclitaxel x 3 cycles   07/28/2015 Surgery Exploratory laparotomy with total abdominal hysterectomy, bilateral salpingo-oophorectomy, omentectomy radical tumor debulking for ovarian cancer; by Dr. Everitt Amber.   07/28/2015 Pathology Results Uterus +/- tubes/ovaries, neoplastic, cervix - HIGH GRADE SEROUS CARCINOMA INVOLVING LEFT OVARY, LEFT FALLOPIAN TUBE AND RIGHT FALLOPIAN TUBE. - CERVIX, ENDOMETRIUM AND MYOMETRIUM ARE FREE OF TUMOR. 2. Cul-de-sac biop...   08/26/2015 -  Chemotherapy Carboplatin/Paclitaxel x 3 cycles    CURRENT THERAPY:  Carboplatin/Paclitaxel every 21 days beginning on 05/19/2015.  INTERVAL HISTORY: Kersti Scavone 75 y.o. 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  She completed 3 cycles of neoadjuvant Carboplatin/Taxol with an excellent response and tolerance. She underwent an exploratory laparotomy with TAH/BSO, omentectomy and radical tumor debulking with Dr. Denman George on 07/28/2015.  She has done extremely well. Per reports Dr. Denman George was able to do an optimal cytoreduction (R0) with no gross visible disease remaining.   Ms. Robbs returns to the [facility] alone today for her final treatment. She is doing very well.  She says that her hands and feet are okay and demonstrates that she can button her shirts, saying "I can do it all by myself." When asked about symptoms of neuropathy, she says that her hands and feet were feeling tingly before she came up here, but not anymore. In general she says she feels good at home, commenting "I don't feel bad."  She denies any nausea or vomiting and says that her bowel movements are moving good, commenting that she had one this morning. When asked if she's eating well, she says "I'm eating better." She says that it used to be much worse, in terms of her taste and everything. She also confirms that the area of her surgery is healed "good."  She doesn't know if she has another appointment with Dr. Denman George at this time.  She says that her son is mostly concerned about why her blood stays low. She is also concerned about this.  In general, Ms. Gambrill says that her Crohn's has been under control, and says she hasn't noticed any blood in her stool.  She denies any swelling in her legs.  Overall, she says "everything  be going good with me." She says she doesn't take pain medicine every day, only when she has pain, adding "If I was in pain,  I would get you to write up some more."   Past Medical History  Diagnosis Date  . Hypertension   . Schizophrenia ([place])   . 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 Ulcerative colitis ([place])   . Mixed hyperlipidemia   . Vitamin D deficiency   . B12 deficiency   . Iron deficiency anemia   . Ovarian cancer ([place])   . Cataract   . Arthritis   . History of blood transfusion   . History of chemotherapy     has Renal failure; Nausea without vomiting; Ascites, malignant; Ovarian cancer ([place]); Omental mass; Acute kidney injury ([place]); UTI (lower urinary tract infection); Normocytic anemia; IBD (inflammatory bowel disease); Schizophrenia, unspecified type ([place]); Diarrhea; Malnutrition of moderate degree ([place]); Acute renal failure ([place]); and Ileus, postoperative on her problem list.     has No Known Allergies.  Current Outpatient Prescriptions on File Prior to Visit  Medication Sig Dispense Refill  . acetaminophen (TYLENOL) 500 MG tablet Take 2 tablets (1,000 mg total) by mouth every 12 (twelve) hours. 30 tablet 0  . amLODipine (NORVASC) 10 MG tablet Take 10 mg by mouth every evening.     Marland Kitchen aspirin EC 81 MG tablet Take 81 mg by mouth daily.    Marland Kitchen CARBOPLATIN IV Inject into the vein every 21 ( twenty-one) days. To start 05/13/15    . Cholecalciferol (VITAMIN D-3) 1000 UNITS CAPS Take 1,000 Units by mouth daily.     Marland Kitchen dexamethasone (DECADRON) 4 MG tablet The day before chemo take 5 tablets (28m) in the am and 5 tablets (2105m in the pm. The morning of chemo take 5 tablets (2058m 30 tablet 1  . docusate sodium (COLACE) 100 MG capsule Take 100 mg by mouth 2 (two) times daily.    . ferrous sulfate 325 (65 FE) MG EC tablet Take 325 mg by mouth 2 (two) times daily.    . folic acid (FOLVITE) 1 MG tablet Take 1 mg by mouth daily.    . lMarland Kitchendocaine-prilocaine (EMLA) cream Apply a quarter size amount to port site 1 hour prior to chemo. Do not rub in. Cover with plastic wrap. 30 g 3  . lovastatin (MEVACOR) 10 MG tablet  Take 10 mg by mouth at bedtime.    . megestrol (MEGACE) 400 MG/10ML suspension Take 10 mLs (400 mg total) by mouth daily. 240 mL 0  . mirtazapine (REMERON) 15 MG tablet Take 7.5 mg by mouth at bedtime.    . Multiple Vitamin (MULTIVITAMIN WITH MINERALS) TABS tablet Take 1 tablet by mouth daily.    . ondansetron (ZOFRAN) 8 MG tablet Take 1 tablet (8 mg total) by mouth every 8 (eight) hours as needed for nausea or vomiting. 30 tablet 2  . oxyCODONE-acetaminophen (PERCOCET/ROXICET) 5-325 MG tablet Take 1 tablet by mouth every 4 (four) hours as needed for severe pain. 60 tablet 0  . PACLitaxel (TAXOL IV) Inject into the vein every 21 ( twenty-one) days. To start 05/13/15    . Pegfilgrastim (NEULASTA ONPRO [place]) Inject into the skin every 21 ( twenty-one) days. To be applied to skin at the end of chemo.    . QMarland KitchenEtiapine (SEROQUEL XR) 300 MG 24 hr tablet Take 300 mg by mouth at bedtime.    . sulfaSALAzine (AZULFIDINE) 500 MG tablet Take 500 mg by mouth 3 (three) times daily.    .Marland Kitchen  vitamin B-12 (CYANOCOBALAMIN) 1000 MCG tablet Take 1,000 mcg by mouth daily.    Marland Kitchen enoxaparin (LOVENOX) 40 MG/0.4ML injection Inject 0.4 mLs (40 mg total) into the skin daily. (Patient not taking: Reported on 10/07/2015) 24 Syringe 0  . ondansetron (ZOFRAN) 4 MG tablet Take 1 tablet (4 mg total) by mouth every 6 (six) hours as needed for nausea. (Patient not taking: Reported on 10/07/2015) 20 tablet 0  . prochlorperazine (COMPAZINE) 10 MG tablet Take 1 tablet (10 mg total) by mouth every 6 (six) hours as needed (Nausea or vomiting). (Patient not taking: Reported on 10/07/2015) 30 tablet 1  . traMADol (ULTRAM) 50 MG tablet Take 1 tablet (50 mg total) by mouth every 6 (six) hours as needed. (Patient not taking: Reported on 08/25/2015) 30 tablet 1   Current Facility-Administered Medications on File Prior to Visit  Medication Dose Route Frequency Provider Last Rate Last Dose  . 0.9 %  sodium chloride infusion   Intravenous Once Patrici Ranks, MD      . CARBOplatin (PARAPLATIN) 390 mg in sodium chloride 0.9 % 100 mL chemo infusion  390 mg Intravenous Once Patrici Ranks, MD      . diphenhydrAMINE (BENADRYL) injection 50 mg  50 mg Intravenous Once Patrici Ranks, MD      . famotidine (PEPCID) IVPB 20 mg premix  20 mg Intravenous Once Patrici Ranks, MD      . heparin lock flush 100 unit/mL  500 Units Intracatheter Once PRN Patrici Ranks, MD      . ondansetron (ZOFRAN) 16 mg, dexamethasone (DECADRON) 20 mg in sodium chloride 0.9 % 50 mL IVPB   Intravenous Once Patrici Ranks, MD      . PACLitaxel (TAXOL) 312 mg in dextrose 5 % 500 mL chemo infusion (> 49m/m2)  175 mg/m2 (Treatment Plan Actual) Intravenous Once SPatrici Ranks MD      . pegfilgrastim (NEULASTA ONPRO [place]) injection 6 mg  6 mg Subcutaneous Once SPatrici Ranks MD      . sodium chloride 0.9 % injection 10 mL  10 mL Intracatheter PRN SPatrici Ranks MD        Past Surgical History  Procedure Laterality Date  . Replacement total knee      right knee in 2003  . Paracentesis  04/27/15  . Portacath placement Right 04/2015  . Laparotomy N/A 07/28/2015    Procedure: EXPLORATORY LAPAROTOMY;  Surgeon: EEveritt Amber MD;  Location: [facility];  Service: Gynecology;  Laterality: N/A;  . Abdominal hysterectomy N/A 07/28/2015    Procedure: TOTAL HYSTERECTOMY ABDOMINAL BILATERAL SALPINGO OOPHRORECTOMY;  Surgeon: EEveritt Amber MD;  Location: [facility];  Service: Gynecology;  Laterality: N/A;  . Omentectomy N/A 07/28/2015    Procedure: OMENTECTOMY ;  Surgeon: EEveritt Amber MD;  Location: [facility];  Service: Gynecology;  Laterality: N/A;  . Debulking N/A 07/28/2015    Procedure: DEBULKING;  Surgeon: EEveritt Amber MD;  Location: [facility];  Service: Gynecology;  Laterality: N/A;    Denies any headaches, dizziness, double vision, fevers, chills, night sweats, nausea, vomiting, diarrhea, constipation, chest pain, heart palpitations, shortness of breath, blood in stool, black tarry  stool, urinary pain, urinary burning, urinary frequency, hematuria.  14 point review of systems was performed and is negative except as detailed under history of present illness and above    PHYSICAL EXAMINATION  ECOG PERFORMANCE STATUS: 1 - Symptomatic but completely ambulatory  Filed Vitals:   10/07/15 0848  BP: 145/70  Pulse: 94  Temp: 98.1 F (36.7 C)  Resp: 18    GENERAL:alert, no distress, well nourished, well developed, comfortable, cooperative, smiling and accompanied by her son, Jeneen Rinks. SKIN: skin color, texture, turgor are normal, no rashes or significant lesions HEAD: Normocephalic, No masses, lesions, tenderness or abnormalities EYES: normal, PERRLA, EOMI, Conjunctiva are pink and non-injected EARS: External ears normal OROPHARYNX:lips, buccal mucosa, and tongue normal and mucous membranes are moist  NECK: supple, trachea midline LYMPH:  NO palpable adenopathy in the neck, axillae  BREAST:not examined LUNGS: CTA B/L HEART: RRR without murmur, rub or gallop. ABDOMEN:abdomen soft and non-tender, no rebound or guarding. Well healed surgical incision site.  BACK: Back symmetric, no curvature. EXTREMITIES:less then 2 second capillary refill, no joint deformities, effusion, or inflammation, no skin discoloration, positive findings:   NEURO: alert & oriented x 3 with fluent speech, no focal motor/sensory deficits, gait normal   LABORATORY DATA: I have reviewed the data as listed  CBC    Component Value Date/Time   WBC 17.9* 10/07/2015 0845   RBC 2.39* 10/07/2015 0845   RBC 2.53* 08/26/2015 0905   HGB 8.0* 10/07/2015 0845   HCT 24.0* 10/07/2015 0845   PLT 262 10/07/2015 0845   MCV 100.4* 10/07/2015 0845   MCH 33.5 10/07/2015 0845   MCHC 33.3 10/07/2015 0845   RDW 19.8* 10/07/2015 0845   LYMPHSABS 0.7 10/07/2015 0845   MONOABS 1.1* 10/07/2015 0845   EOSABS 0.0 10/07/2015 0845   BASOSABS 0.0 10/07/2015 0845      Chemistry      Component Value Date/Time    NA 141 10/07/2015 0845   K 4.0 10/07/2015 0845   CL 109 10/07/2015 0845   CO2 24 10/07/2015 0845   BUN 23* 10/07/2015 0845   CREATININE 1.04* 10/07/2015 0845      Component Value Date/Time   CALCIUM 9.4 10/07/2015 0845   ALKPHOS 65 10/07/2015 0845   AST 23 10/07/2015 0845   ALT 20 10/07/2015 0845   BILITOT 0.6 10/07/2015 0845     Lab Results  Component Value Date   CA125 11.0 09/16/2015    Lab Results  Component Value Date   CEA 0.5 04/26/2015   PATHOLOGY:     ASSESSMENT AND PLAN:  Stage IVB Ovarian Carcinoma Malignant Ascites Anemia Leukocytosis  She has done very well with therapy. She achieved an R0 resection at the time of surgery.   Her tumor marker is 11 and looks wonderful.  Again, she has had significant anemia prior to therapy.  I do not feel it is all related to her malignancy or therapy.  Her stool cards came back negative. When we see her back in 3-4 weeks, we will discuss her anemia, and most likely do a bone marrow biopsy.  I want her to come back next week for a blood count to see whether she needs blood again or not.. She has a history of Crohns' but it does not seem to be active.   In terms of refills, she needs metoprolol 25 mg; she takes it twice daily. She fills her medications at [facility] in [place].  Orders Placed This Encounter  Procedures  . CBC with Differential    Standing Status: Future     Number of Occurrences:      Standing Expiration Date: 10/06/2016  . Ferritin    Standing Status: Future     Number of Occurrences:      Standing Expiration Date: 10/06/2016  . C-reactive protein  Standing Status: Future     Number of Occurrences:      Standing Expiration Date: 10/06/2016  . Sedimentation rate    Standing Status: Future     Number of Occurrences:      Standing Expiration Date: 10/06/2016   All questions were answered. The patient knows to call the clinic with any problems, questions or concerns. We can  certainly see the patient much sooner if necessary.  This document serves as a record of services personally performed by Ancil Linsey, MD. It was created on her behalf by Toni Amend, a trained medical scribe. The creation of this record is based on the scribe's personal observations and the provider's statements to them. This document has been checked and approved by the attending provider.  I have reviewed the above documentation for accuracy and completeness, and I agree with the above.  This note is electronically signed by: Molli Hazard, MD  9:59 AM

## 2015-10-07 NOTE — Patient Instructions (Signed)
[facility] Discharge Instructions for Patients Receiving Chemotherapy  Today you received the following chemotherapy agents Taxol and Carbo. Neulasta OnPro body injector placed on left upper arm. Device will deliver medication between 630 pm and 730 pm tomorrow. You may remove device after 8 pm tomorrow.  To help prevent nausea and vomiting after your treatment, we encourage you to take your nausea medication as instructed. If you develop nausea and vomiting that is not controlled by your nausea medication, call the clinic. If it is after clinic hours your family physician or the after hours number for the clinic or go to the Emergency Department. BELOW ARE SYMPTOMS THAT SHOULD BE REPORTED IMMEDIATELY:  *FEVER GREATER THAN 101.0 F  *CHILLS WITH OR WITHOUT FEVER  NAUSEA AND VOMITING THAT IS NOT CONTROLLED WITH YOUR NAUSEA MEDICATION  *UNUSUAL SHORTNESS OF BREATH  *UNUSUAL BRUISING OR BLEEDING  TENDERNESS IN MOUTH AND THROAT WITH OR WITHOUT PRESENCE OF ULCERS  *URINARY PROBLEMS  *BOWEL PROBLEMS  UNUSUAL RASH Items with * indicate a potential emergency and should be followed up as soon as possible.  Return as scheduled.  I have been informed and understand all the instructions given to me. I know to contact the clinic, my physician, or go to the Emergency Department if any problems should occur. I do not have any questions at this time, but understand that I may call the clinic during office hours or the Patient Navigator at [phone] should I have any questions or need assistance in obtaining follow up care.    __________________________________________  _____________  __________ Signature of Patient or Authorized Representative            Date                   Time    __________________________________________ Nurse's Signature

## 2015-10-07 NOTE — Patient Instructions (Addendum)
[place] at [facility] Discharge Instructions   RECOMMENDATIONS MADE BY THE CONSULTANT AND ANY TEST RESULTS WILL BE SENT TO YOUR REFERRING PHYSICIAN.   Exam and discussion completed by Dr Whitney Muse today. In about 4 weeks we need to get you set up for bone marrow biopsy. We will address this at the next visit. This will help [place] further investigate your low hemoglobin (anemia) We want you to come in next week for blood check. Return to see the doctor in 4 weeks with labs. Port flushes every 8 weeks. Today is your last treatment. Please call the clinic if you have any questions or concerns.   Thank you for choosing [place] at [facility] to provide your oncology and hematology care.  To afford each patient quality time with our provider, please arrive at least 15 minutes before your scheduled appointment time.    You need to re-schedule your appointment should you arrive 10 or more minutes late.  We strive to give you quality time with our providers, and arriving late affects you and other patients whose appointments are after yours.  Also, if you no show three or more times for appointments you may be dismissed from the clinic at the providers discretion.     Again, thank you for choosing [facility].  Our hope is that these requests will decrease the amount of time that you wait before being seen by our physicians.       _____________________________________________________________  Should you have questions after your visit to [facility], please contact our office at [phone] between the hours of 8:30 a.m. and 4:30 p.m.  Voicemails left after 4:30 p.m. will not be returned until the following business day.  For prescription refill requests, have your pharmacy contact our office.       Bone Marrow Aspiration and Bone Marrow Biopsy Bone marrow aspiration and bone marrow biopsy are procedures that are done  to diagnose blood disorders. You may also have one of these procedures to help diagnose infections or some types of cancer. Bone marrow is the soft tissue that is inside your bones. Blood cells are produced in bone marrow. For bone marrow aspiration, a sample of tissue in liquid form is removed from inside your bone. For a bone marrow biopsy, a small core of bone marrow tissue is removed. Then these samples are examined under a microscope or tested in a lab. You may need these procedures if you have an abnormal complete blood count (CBC). The aspiration or biopsy sample is usually taken from the top of your hip bone. Sometimes, an aspiration sample is taken from your chest bone (sternum). LET [facility] CARE PROVIDER KNOW ABOUT:  Any allergies you have.  All medicines you are taking, including vitamins, herbs, eye drops, creams, and over-the-counter medicines.  Previous problems you or members of your family have had with the use of anesthetics.  Any blood disorders you have.  Previous surgeries you have had.  Any medical conditions you may have.  Whether you are pregnant or you think that you may be pregnant. RISKS AND COMPLICATIONS Generally, this is a safe procedure. However, problems may occur, including:  Infection.  Bleeding. BEFORE THE PROCEDURE  Ask your health care provider about:  Changing or stopping your regular medicines. This is especially important if you are taking diabetes medicines or blood thinners.  Taking medicines such as aspirin and ibuprofen. These medicines can thin  your blood. Do not take these medicines before your procedure if your health care provider instructs you not to.  Plan to have someone take you home after the procedure.  If you go home right after the procedure, plan to have someone with you for 24 hours. PROCEDURE   An IV tube may be inserted into one of your veins.  The injection site will be cleaned with a germ-killing solution  (antiseptic).  You will be given one or more of the following:  A medicine that helps you relax (sedative).  A medicine that numbs the area (local anesthetic).  The bone marrow sample will be removed as follows:  For an aspiration, a hollow needle will be inserted through your skin and into your bone. Bone marrow fluid will be drawn up into a syringe.  For a biopsy, your health care provider will use a hollow needle to remove a core of tissue from your bone marrow.  The needle will be removed.  A bandage (dressing) will be placed over the insertion site and taped in place. The procedure may vary among health care providers and hospitals. AFTER THE PROCEDURE  Your blood pressure, heart rate, breathing rate, and blood oxygen level will be monitored often until the medicines you were given have worn off.  Return to your normal activities as directed by your health care provider.   This information is not intended to replace advice given to you by your health care provider. Make sure you discuss any questions you have with your health care provider.   Document Released: 09/08/2004 Document Revised: 01/20/2015 Document Reviewed: 08/27/2014 Elsevier Interactive Patient Education [employer].

## 2015-10-08 LAB — CA 125: CA 125: 9.2 U/mL (ref 0.0–38.1)

## 2015-10-14 ENCOUNTER — Encounter (HOSPITAL_COMMUNITY): Payer: Medicare Other

## 2015-10-14 ENCOUNTER — Other Ambulatory Visit (HOSPITAL_COMMUNITY): Payer: Self-pay | Admitting: Oncology

## 2015-10-14 DIAGNOSIS — D649 Anemia, unspecified: Secondary | ICD-10-CM

## 2015-10-14 DIAGNOSIS — C569 Malignant neoplasm of unspecified ovary: Secondary | ICD-10-CM | POA: Diagnosis not present

## 2015-10-14 LAB — CBC WITH DIFFERENTIAL/PLATELET
Basophils Absolute: 0 10*3/uL (ref 0.0–0.1)
Basophils Relative: 0 %
EOS ABS: 0.1 10*3/uL (ref 0.0–0.7)
Eosinophils Relative: 1 %
HEMATOCRIT: 20.3 % — AB (ref 36.0–46.0)
HEMOGLOBIN: 6.7 g/dL — AB (ref 12.0–15.0)
LYMPHS ABS: 1.1 10*3/uL (ref 0.7–4.0)
LYMPHS PCT: 12 %
MCH: 34 pg (ref 26.0–34.0)
MCHC: 33 g/dL (ref 30.0–36.0)
MCV: 103 fL — AB (ref 78.0–100.0)
MONOS PCT: 12 %
Monocytes Absolute: 1.1 10*3/uL — ABNORMAL HIGH (ref 0.1–1.0)
NEUTROS ABS: 6.4 10*3/uL (ref 1.7–7.7)
NEUTROS PCT: 74 %
PLATELETS: 110 10*3/uL — AB (ref 150–400)
RBC: 1.97 MIL/uL — AB (ref 3.87–5.11)
RDW: 19.9 % — ABNORMAL HIGH (ref 11.5–15.5)
SMEAR REVIEW: DECREASED
WBC: 8.7 10*3/uL (ref 4.0–10.5)

## 2015-10-14 LAB — C-REACTIVE PROTEIN: CRP: <0.5 mg/dL (ref <1.0)

## 2015-10-14 LAB — FERRITIN: Ferritin: 2509 ng/mL — ABNORMAL HIGH (ref 11–307)

## 2015-10-14 LAB — SEDIMENTATION RATE: SED RATE: 25 mm/h — AB (ref 0–22)

## 2015-10-15 ENCOUNTER — Encounter (HOSPITAL_COMMUNITY): Payer: Medicare Other

## 2015-10-15 DIAGNOSIS — C569 Malignant neoplasm of unspecified ovary: Secondary | ICD-10-CM | POA: Diagnosis not present

## 2015-10-15 LAB — PREPARE RBC (CROSSMATCH)

## 2015-10-16 ENCOUNTER — Encounter (HOSPITAL_BASED_OUTPATIENT_CLINIC_OR_DEPARTMENT_OTHER): Payer: Medicare Other

## 2015-10-16 VITALS — BP 128/57 | HR 79 | Temp 97.9°F | Resp 16

## 2015-10-16 DIAGNOSIS — D649 Anemia, unspecified: Secondary | ICD-10-CM

## 2015-10-16 DIAGNOSIS — C569 Malignant neoplasm of unspecified ovary: Secondary | ICD-10-CM

## 2015-10-16 MED ORDER — HEPARIN SOD (PORK) LOCK FLUSH 100 UNIT/ML IV SOLN
500.0000 [IU] | Freq: Every day | INTRAVENOUS | Status: AC | PRN
  Administered 2015-10-16: 500 [IU]

## 2015-10-16 MED ORDER — HEPARIN SOD (PORK) LOCK FLUSH 100 UNIT/ML IV SOLN
INTRAVENOUS | Status: AC
  Filled 2015-10-16: qty 5

## 2015-10-16 MED ORDER — SODIUM CHLORIDE 0.9 % IV SOLN
250.0000 mL | Freq: Once | INTRAVENOUS | Status: AC
  Administered 2015-10-16: 250 mL via INTRAVENOUS

## 2015-10-16 MED ORDER — ACETAMINOPHEN 325 MG PO TABS
ORAL_TABLET | ORAL | Status: AC
  Filled 2015-10-16: qty 2

## 2015-10-16 MED ORDER — DIPHENHYDRAMINE HCL 25 MG PO CAPS
ORAL_CAPSULE | ORAL | Status: AC
  Filled 2015-10-16: qty 1

## 2015-10-16 MED ORDER — ACETAMINOPHEN 325 MG PO TABS
650.0000 mg | ORAL_TABLET | Freq: Once | ORAL | Status: AC
  Administered 2015-10-16: 650 mg via ORAL

## 2015-10-16 MED ORDER — SODIUM CHLORIDE 0.9% FLUSH
10.0000 mL | INTRAVENOUS | Status: AC | PRN
  Administered 2015-10-16: 10 mL

## 2015-10-16 MED ORDER — DIPHENHYDRAMINE HCL 25 MG PO CAPS
25.0000 mg | ORAL_CAPSULE | Freq: Once | ORAL | Status: AC
  Administered 2015-10-16: 25 mg via ORAL

## 2015-10-16 NOTE — Patient Instructions (Signed)
[place] at [facility] Discharge Instructions  RECOMMENDATIONS MADE BY THE CONSULTANT AND ANY TEST RESULTS WILL BE SENT TO YOUR REFERRING PHYSICIAN.  Today you received 2 units blood transfusion.  Return as scheduled.  Thank you for choosing [place] at [facility] to provide your oncology and hematology care.  To afford each patient quality time with our provider, please arrive at least 15 minutes before your scheduled appointment time.   Beginning January 23rd 2017 lab work for the [facility] will be done in the  Main lab at [facility] on 1st floor. If you have a lab appointment with the [facility] please come in thru the  Main Entrance and check in at the main information desk  You need to re-schedule your appointment should you arrive 10 or more minutes late.  We strive to give you quality time with our providers, and arriving late affects you and other patients whose appointments are after yours.  Also, if you no show three or more times for appointments you may be dismissed from the clinic at the providers discretion.     Again, thank you for choosing [facility].  Our hope is that these requests will decrease the amount of time that you wait before being seen by our physicians.       _____________________________________________________________  Should you have questions after your visit to [facility], please contact our office at [phone] between the hours of 8:30 a.m. and 4:30 p.m.  Voicemails left after 4:30 p.m. will not be returned until the following business day.  For prescription refill requests, have your pharmacy contact our office.

## 2015-10-16 NOTE — Progress Notes (Signed)
1100 Tolerated first unit blood transfusion well.  1300 Tolerated second unit blood transfusion well.  Ambulatory on discharge home with son.

## 2015-10-17 LAB — TYPE AND SCREEN
ABO/RH(D): A POS
Antibody Screen: NEGATIVE
Unit division: 0
Unit division: 0

## 2015-11-04 ENCOUNTER — Encounter (HOSPITAL_COMMUNITY): Payer: Self-pay | Admitting: Hematology & Oncology

## 2015-11-04 ENCOUNTER — Encounter (HOSPITAL_BASED_OUTPATIENT_CLINIC_OR_DEPARTMENT_OTHER): Payer: Medicare Other | Admitting: Hematology & Oncology

## 2015-11-04 ENCOUNTER — Encounter (HOSPITAL_COMMUNITY): Payer: Medicare Other | Attending: Oncology

## 2015-11-04 VITALS — BP 139/67 | HR 72 | Temp 98.4°F | Resp 16 | Wt 133.0 lb

## 2015-11-04 DIAGNOSIS — C7982 Secondary malignant neoplasm of genital organs: Secondary | ICD-10-CM

## 2015-11-04 DIAGNOSIS — Z7982 Long term (current) use of aspirin: Secondary | ICD-10-CM | POA: Diagnosis not present

## 2015-11-04 DIAGNOSIS — C786 Secondary malignant neoplasm of retroperitoneum and peritoneum: Secondary | ICD-10-CM | POA: Insufficient documentation

## 2015-11-04 DIAGNOSIS — E782 Mixed hyperlipidemia: Secondary | ICD-10-CM | POA: Diagnosis not present

## 2015-11-04 DIAGNOSIS — I10 Essential (primary) hypertension: Secondary | ICD-10-CM | POA: Diagnosis not present

## 2015-11-04 DIAGNOSIS — R180 Malignant ascites: Secondary | ICD-10-CM | POA: Diagnosis not present

## 2015-11-04 DIAGNOSIS — D649 Anemia, unspecified: Secondary | ICD-10-CM | POA: Diagnosis not present

## 2015-11-04 DIAGNOSIS — Z79899 Other long term (current) drug therapy: Secondary | ICD-10-CM | POA: Diagnosis not present

## 2015-11-04 DIAGNOSIS — D72829 Elevated white blood cell count, unspecified: Secondary | ICD-10-CM | POA: Diagnosis not present

## 2015-11-04 DIAGNOSIS — C562 Malignant neoplasm of left ovary: Secondary | ICD-10-CM

## 2015-11-04 DIAGNOSIS — E559 Vitamin D deficiency, unspecified: Secondary | ICD-10-CM | POA: Diagnosis not present

## 2015-11-04 DIAGNOSIS — C569 Malignant neoplasm of unspecified ovary: Secondary | ICD-10-CM | POA: Insufficient documentation

## 2015-11-04 DIAGNOSIS — C771 Secondary and unspecified malignant neoplasm of intrathoracic lymph nodes: Secondary | ICD-10-CM | POA: Diagnosis not present

## 2015-11-04 DIAGNOSIS — E538 Deficiency of other specified B group vitamins: Secondary | ICD-10-CM | POA: Insufficient documentation

## 2015-11-04 LAB — CBC WITH DIFFERENTIAL/PLATELET
BASOS ABS: 0 10*3/uL (ref 0.0–0.1)
BASOS PCT: 0 %
EOS ABS: 0.2 10*3/uL (ref 0.0–0.7)
EOS PCT: 3 %
HCT: 28.4 % — ABNORMAL LOW (ref 36.0–46.0)
Hemoglobin: 9.6 g/dL — ABNORMAL LOW (ref 12.0–15.0)
Lymphocytes Relative: 19 %
Lymphs Abs: 1.2 10*3/uL (ref 0.7–4.0)
MCH: 35.3 pg — ABNORMAL HIGH (ref 26.0–34.0)
MCHC: 33.8 g/dL (ref 30.0–36.0)
MCV: 104.4 fL — AB (ref 78.0–100.0)
Monocytes Absolute: 0.9 10*3/uL (ref 0.1–1.0)
Monocytes Relative: 14 %
NEUTROS PCT: 64 %
Neutro Abs: 4 10*3/uL (ref 1.7–7.7)
PLATELETS: 231 10*3/uL (ref 150–400)
RBC: 2.72 MIL/uL — AB (ref 3.87–5.11)
RDW: 17.4 % — AB (ref 11.5–15.5)
WBC: 6.2 10*3/uL (ref 4.0–10.5)

## 2015-11-04 LAB — COMPREHENSIVE METABOLIC PANEL
ALBUMIN: 4.2 g/dL (ref 3.5–5.0)
ALT: 14 U/L (ref 14–54)
AST: 19 U/L (ref 15–41)
Alkaline Phosphatase: 65 U/L (ref 38–126)
Anion gap: 7 (ref 5–15)
BUN: 13 mg/dL (ref 6–20)
CHLORIDE: 105 mmol/L (ref 101–111)
CO2: 29 mmol/L (ref 22–32)
CREATININE: 0.83 mg/dL (ref 0.44–1.00)
Calcium: 9 mg/dL (ref 8.9–10.3)
GFR calc Af Amer: >60 mL/min (ref >60)
GFR calc non Af Amer: >60 mL/min (ref >60)
Glucose, Bld: 96 mg/dL (ref 65–99)
POTASSIUM: 3.7 mmol/L (ref 3.5–5.1)
SODIUM: 141 mmol/L (ref 135–145)
Total Bilirubin: 0.5 mg/dL (ref 0.3–1.2)
Total Protein: 6.8 g/dL (ref 6.5–8.1)

## 2015-11-04 LAB — SAMPLE TO BLOOD BANK

## 2015-11-04 MED ORDER — OXYCODONE-ACETAMINOPHEN 5-325 MG PO TABS
1.0000 | ORAL_TABLET | ORAL | Status: DC | PRN
Start: 2015-11-04 — End: 2016-07-05

## 2015-11-04 MED ORDER — MEGESTROL ACETATE 400 MG/10ML PO SUSP: 400.0000 mg | Freq: Every day | ORAL | Status: DC

## 2015-11-04 MED ORDER — TRAMADOL HCL 50 MG PO TABS: 50.0000 mg | ORAL_TABLET | Freq: Four times a day (QID) | ORAL | Status: DC | PRN

## 2015-11-04 NOTE — Patient Instructions (Addendum)
[place] at [facility] Discharge Instructions  RECOMMENDATIONS MADE BY THE CONSULTANT AND ANY TEST RESULTS WILL BE SENT TO YOUR REFERRING PHYSICIAN.    Exam and discussion by Dr Whitney Muse today  Hemoglobin 9.6 We are going to re-check your blood counts in 2 weeks, if your blood counts are still low we will need to do a bone marrow biopsy.  This will look at where the blood is made. This will be done at [facility].  They can give you something to make you a little sleepy and you wont remember anything and it takes about 10 minutes.  We will call you with your tumor marker.    Refill on your tramadol, oxycodone, and megace (sent to your [place])  Return to see the doctor in 2 months with labs. Please call the clinic if you have any questions or concerns      Thank you for choosing [place] at [facility] to provide your oncology and hematology care.  To afford each patient quality time with our provider, please arrive at least 15 minutes before your scheduled appointment time.   Beginning January 23rd 2017 lab work for the [facility] will be done in the  Main lab at [facility] on 1st floor. If you have a lab appointment with the [facility] please come in thru the  Main Entrance and check in at the main information desk  You need to re-schedule your appointment should you arrive 10 or more minutes late.  We strive to give you quality time with our providers, and arriving late affects you and other patients whose appointments are after yours.  Also, if you no show three or more times for appointments you may be dismissed from the clinic at the providers discretion.     Again, thank you for choosing [facility].  Our hope is that these requests will decrease the amount of time that you wait before being seen by our physicians.       _____________________________________________________________  Should you  have questions after your visit to [facility], please contact our office at [phone] between the hours of 8:30 a.m. and 4:30 p.m.  Voicemails left after 4:30 p.m. will not be returned until the following business day.  For prescription refill requests, have your pharmacy contact our office.      Bone Marrow Aspiration and Bone Marrow Biopsy Bone marrow aspiration and bone marrow biopsy are procedures that are done to diagnose blood disorders. You may also have one of these procedures to help diagnose infections or some types of cancer. Bone marrow is the soft tissue that is inside your bones. Blood cells are produced in bone marrow. For bone marrow aspiration, a sample of tissue in liquid form is removed from inside your bone. For a bone marrow biopsy, a small core of bone marrow tissue is removed. Then these samples are examined under a microscope or tested in a lab. You may need these procedures if you have an abnormal complete blood count (CBC). The aspiration or biopsy sample is usually taken from the top of your hip bone. Sometimes, an aspiration sample is taken from your chest bone (sternum). LET [facility] CARE PROVIDER KNOW ABOUT:  Any allergies you have.  All medicines you are taking, including vitamins, herbs, eye drops, creams, and over-the-counter medicines.  Previous problems you or members of your family have had with the use of  anesthetics.  Any blood disorders you have.  Previous surgeries you have had.  Any medical conditions you may have.  Whether you are pregnant or you think that you may be pregnant. RISKS AND COMPLICATIONS Generally, this is a safe procedure. However, problems may occur, including:  Infection.  Bleeding. BEFORE THE PROCEDURE  Ask your health care provider about:  Changing or stopping your regular medicines. This is especially important if you are taking diabetes medicines or blood thinners.  Taking medicines such as aspirin  and ibuprofen. These medicines can thin your blood. Do not take these medicines before your procedure if your health care provider instructs you not to.  Plan to have someone take you home after the procedure.  If you go home right after the procedure, plan to have someone with you for 24 hours. PROCEDURE   An IV tube may be inserted into one of your veins.  The injection site will be cleaned with a germ-killing solution (antiseptic).  You will be given one or more of the following:  A medicine that helps you relax (sedative).  A medicine that numbs the area (local anesthetic).  The bone marrow sample will be removed as follows:  For an aspiration, a hollow needle will be inserted through your skin and into your bone. Bone marrow fluid will be drawn up into a syringe.  For a biopsy, your health care provider will use a hollow needle to remove a core of tissue from your bone marrow.  The needle will be removed.  A bandage (dressing) will be placed over the insertion site and taped in place. The procedure may vary among health care providers and hospitals. AFTER THE PROCEDURE  Your blood pressure, heart rate, breathing rate, and blood oxygen level will be monitored often until the medicines you were given have worn off.  Return to your normal activities as directed by your health care provider.   This information is not intended to replace advice given to you by your health care provider. Make sure you discuss any questions you have with your health care provider.   Document Released: 09/08/2004 Document Revised: 01/20/2015 Document Reviewed: 08/27/2014 Elsevier Interactive Patient Education [employer].

## 2015-11-04 NOTE — Progress Notes (Signed)
No PCP Per Patient No address on file    Ovarian cancer [facility])   04/25/2015 - 04/28/2015 Hospital Admission Nausea/diarrhea.  Oncology consult completed on 04/27/2015.   04/25/2015 Tumor Marker CA [id number].      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   04/27/2015 Procedure US Paracentesis- A total of approximately 3700 mL of amber colored fluid was removed. A fluid sample was sent for laboratory analysis.   04/27/2015 Imaging CT Chest- Mild left supraclavicular lymphadenopathy and moderate mediastinal lymphadenopathy involving the prevascular, subcarinal and bilateral pericardiophrenic nodal chains, likely metastatic.   04/27/2015 Pathology Results Diagnosis PERITONEAL/ASCITIC FLUID(SPECIMEN 1 OF 1 COLLECTED 04/27/15): MALIGNANT CELLS CONSISTENT WITH METASTATIC ADENOCARCINOMA.  The immunophenotype is most consistent with a gynecologic primary, most likely ovary.   05/08/2015 Miscellaneous Seen by Dr. Denman George- recommending Carboplatin/Paclitaxel x 3 cycles and return visit to see her (within 1 week of administration of third cycle) to evaluate for optimal sequencing of treatment modalities.   05/13/2015 - 05/15/2015 Hospital Admission Hospitalized for AKI   05/19/2015 - 07/01/2015 Chemotherapy Carboplatin/Paclitaxel x 3 cycles   07/28/2015 Surgery Exploratory laparotomy with total abdominal hysterectomy, bilateral salpingo-oophorectomy, omentectomy radical tumor debulking for ovarian cancer; by Dr. Everitt Amber.   07/28/2015 Pathology Results Uterus +/- tubes/ovaries, neoplastic, cervix - HIGH GRADE SEROUS CARCINOMA INVOLVING LEFT OVARY, LEFT FALLOPIAN TUBE AND RIGHT FALLOPIAN TUBE. - CERVIX, ENDOMETRIUM AND MYOMETRIUM ARE FREE OF TUMOR. 2. Cul-de-sac biop...   08/26/2015 -  Chemotherapy Carboplatin/Paclitaxel x 3 cycles    CURRENT THERAPY:  Carboplatin/Paclitaxel every 21 days beginning on 05/19/2015.  INTERVAL HISTORY: Latasha [name] 75 y.o. 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  Latasha [name] is accompanied by her son. I personally reviewed and went over laboratory results and future blood checks at length with the patient.  She states that she feels okay, though this time of the year she does not do a whole lot. She is able to clean her house. Denies any stomach pain, nausea, or bowel issues. Reports her left knee hurts but is managed with pain medication. She is able to button her shirt okay. She tries not to worry herself or "dwell on it". Her appetite "is not good some days and better on other days". When she is not very hungry, she drinks an Ensure.  Her son does not believe his mother is back to her normal self but is much better than 6 months ago. Her son agrees that she does not eat as much as she should. Overall they both note she is markedly improved.   Past Medical History  Diagnosis Date  . Hypertension   . Schizophrenia ([place])   . 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 Ulcerative colitis ([place])   . Mixed hyperlipidemia   . Vitamin D deficiency   . B12 deficiency   . Iron deficiency anemia   . Ovarian cancer ([place])   . Cataract   . Arthritis   . History of blood transfusion   . History of chemotherapy     has Renal failure; Nausea without vomiting; Ascites, malignant; Ovarian cancer ([place]); Omental mass; Acute kidney injury ([place]); UTI (lower urinary tract infection); Normocytic anemia; IBD (inflammatory bowel disease); Schizophrenia, unspecified type ([place]); Diarrhea; Malnutrition of moderate degree ([place]);  Acute renal failure ([place]); and Ileus, postoperative on her problem list.     has No Known Allergies.  Current Outpatient Prescriptions on File Prior to Visit  Medication Sig  Dispense Refill  . acetaminophen (TYLENOL) 500 MG tablet Take 2 tablets (1,000 mg total) by mouth every 12 (twelve) hours. 30 tablet 0  . amLODipine (NORVASC) 10 MG tablet Take 10 mg by mouth every evening.     Marland Kitchen aspirin EC 81 MG tablet Take 81 mg by mouth daily.    . Cholecalciferol (VITAMIN D-3) 1000 UNITS CAPS Take 1,000 Units by mouth daily.     Marland Kitchen docusate sodium (COLACE) 100 MG capsule Take 100 mg by mouth 2 (two) times daily.    . ferrous sulfate 325 (65 FE) MG EC tablet Take 325 mg by mouth 2 (two) times daily.    . folic acid (FOLVITE) 1 MG tablet Take 1 mg by mouth daily.    Marland Kitchen lidocaine-prilocaine (EMLA) cream Apply a quarter size amount to port site 1 hour prior to chemo. Do not rub in. Cover with plastic wrap. 30 g 3  . lovastatin (MEVACOR) 10 MG tablet Take 10 mg by mouth at bedtime.    . metoprolol tartrate (LOPRESSOR) 25 MG tablet Take 1 tablet (25 mg total) by mouth 2 (two) times daily. 60 tablet 6  . mirtazapine (REMERON) 15 MG tablet Take 7.5 mg by mouth at bedtime.    . Multiple Vitamin (MULTIVITAMIN WITH MINERALS) TABS tablet Take 1 tablet by mouth daily.    . QUEtiapine (SEROQUEL XR) 300 MG 24 hr tablet Take 300 mg by mouth at bedtime.    . sulfaSALAzine (AZULFIDINE) 500 MG tablet Take 500 mg by mouth 3 (three) times daily.    . vitamin B-12 (CYANOCOBALAMIN) 1000 MCG tablet Take 1,000 mcg by mouth daily.    . ondansetron (ZOFRAN) 4 MG tablet Take 1 tablet (4 mg total) by mouth every 6 (six) hours as needed for nausea. (Patient not taking: Reported on 10/07/2015) 20 tablet 0  . ondansetron (ZOFRAN) 8 MG tablet Take 1 tablet (8 mg total) by mouth every 8 (eight) hours as needed for nausea or vomiting. (Patient not taking: Reported on 11/04/2015) 30 tablet 2  . prochlorperazine (COMPAZINE) 10 MG tablet Take 1 tablet (10 mg total) by mouth every 6 (six) hours as needed (Nausea or vomiting). (Patient not taking: Reported on 10/07/2015) 30 tablet 1   No current facility-administered  medications on file prior to visit.    Past Surgical History  Procedure Laterality Date  . Replacement total knee      right knee in 2003  . Paracentesis  04/27/15  . Portacath placement Right 04/2015  . Laparotomy N/A 07/28/2015    Procedure: EXPLORATORY LAPAROTOMY;  Surgeon: Everitt Amber, MD;  Location: [facility];  Service: Gynecology;  Laterality: N/A;  . Abdominal hysterectomy N/A 07/28/2015    Procedure: TOTAL HYSTERECTOMY ABDOMINAL BILATERAL SALPINGO OOPHRORECTOMY;  Surgeon: Everitt Amber, MD;  Location: [facility];  Service: Gynecology;  Laterality: N/A;  . Omentectomy N/A 07/28/2015    Procedure: OMENTECTOMY ;  Surgeon: Everitt Amber, MD;  Location: [facility];  Service: Gynecology;  Laterality: N/A;  . Debulking N/A 07/28/2015    Procedure: DEBULKING;  Surgeon: Everitt Amber, MD;  Location: [facility];  Service: Gynecology;  Laterality: N/A;    Denies any headaches, dizziness, double vision, fevers, chills, night sweats, nausea, vomiting, diarrhea, constipation, chest pain, heart palpitations, shortness of breath, blood in stool, black tarry stool, urinary  pain, urinary burning, urinary frequency, hematuria. Positive for Left knee pain.  14 point review of systems was performed and is negative except as detailed under history of present illness and above  PHYSICAL EXAMINATION  ECOG PERFORMANCE STATUS: 1 - Symptomatic but completely ambulatory  Filed Vitals:   11/04/15 1035  BP: 139/67  Pulse: 72  Temp: 98.4 F (36.9 C)  Resp: 16    GENERAL:alert, no distress, well nourished, well developed, comfortable, cooperative, smiling and accompanied by her son, Latasha [name]. SKIN: skin color, texture, turgor are normal, no rashes or significant lesions HEAD: Normocephalic, No masses, lesions, tenderness or abnormalities EYES: normal, PERRLA, EOMI, Conjunctiva are pink and non-injected EARS: External ears normal OROPHARYNX:lips, buccal mucosa, and tongue normal and mucous membranes are moist  NECK: supple, trachea  midline LYMPH:  NO palpable adenopathy in the neck, axillae  BREAST:not examined LUNGS: CTA B/L HEART: RRR without murmur, rub or gallop. ABDOMEN:abdomen soft and non-tender, no rebound or guarding. Well healed surgical incision site.  BACK: Back symmetric, no curvature. EXTREMITIES:less then 2 second capillary refill, no joint deformities, effusion, or inflammation, no skin discoloration, positive findings:   NEURO: alert & oriented x 3 with fluent speech, no focal motor/sensory deficits, gait normal   LABORATORY DATA: I have reviewed the data as listed CBC    Component Value Date/Time   WBC 6.2 11/04/2015 1001   RBC 2.72* 11/04/2015 1001   RBC 2.53* 08/26/2015 0905   HGB 9.6* 11/04/2015 1001   HCT 28.4* 11/04/2015 1001   PLT 231 11/04/2015 1001   MCV 104.4* 11/04/2015 1001   MCH 35.3* 11/04/2015 1001   MCHC 33.8 11/04/2015 1001   RDW 17.4* 11/04/2015 1001   LYMPHSABS 1.2 11/04/2015 1001   MONOABS 0.9 11/04/2015 1001   EOSABS 0.2 11/04/2015 1001   BASOSABS 0.0 11/04/2015 1001      Chemistry      Component Value Date/Time   NA 141 11/04/2015 1001   K 3.7 11/04/2015 1001   CL 105 11/04/2015 1001   CO2 29 11/04/2015 1001   BUN 13 11/04/2015 1001   CREATININE 0.83 11/04/2015 1001      Component Value Date/Time   CALCIUM 9.0 11/04/2015 1001   ALKPHOS 65 11/04/2015 1001   AST 19 11/04/2015 1001   ALT 14 11/04/2015 1001   BILITOT 0.5 11/04/2015 1001     Lab Results  Component Value Date   CA125 9.2 10/07/2015    Lab Results  Component Value Date   CEA 0.5 04/26/2015    Results for Latasha [name], Latasha [name] (MRN [id number]) as of 11/04/2015 11:18  Ref. Range 11/04/2015 10:01  Sodium Latest Ref Range: 135-145 mmol/L 141  Potassium Latest Ref Range: 3.5-5.1 mmol/L 3.7  Chloride Latest Ref Range: 101-111 mmol/L 105  CO2 Latest Ref Range: 22-32 mmol/L 29  BUN Latest Ref Range: 6-20 mg/dL 13  Creatinine Latest Ref Range: 0.44-1.00 mg/dL 0.83  Calcium Latest Ref Range:  8.9-10.3 mg/dL 9.0  EGFR (Non-African Amer.) Latest Ref Range: >60 mL/min >60  EGFR (African American) Latest Ref Range: >60 mL/min >60  Glucose Latest Ref Range: 65-99 mg/dL 96  Anion gap Latest Ref Range: 5-15  7  Alkaline Phosphatase Latest Ref Range: 38-126 U/L 65  Albumin Latest Ref Range: 3.5-5.0 g/dL 4.2  AST Latest Ref Range: 15-41 U/L 19  ALT Latest Ref Range: 14-54 U/L 14  Total Protein Latest Ref Range: 6.5-8.1 g/dL 6.8  Total Bilirubin Latest Ref Range: 0.3-1.2 mg/dL 0.5  WBC Latest Ref Range: 4.0-10.5 K/uL  6.2  RBC Latest Ref Range: 3.87-5.11 MIL/uL 2.72 (L)  Hemoglobin Latest Ref Range: 12.0-15.0 g/dL 9.6 (L)  HCT Latest Ref Range: 36.0-46.0 % 28.4 (L)  MCV Latest Ref Range: 78.0-100.0 fL 104.4 (H)  MCH Latest Ref Range: 26.0-34.0 pg 35.3 (H)  MCHC Latest Ref Range: 30.0-36.0 g/dL 33.8  RDW Latest Ref Range: 11.5-15.5 % 17.4 (H)  Platelets Latest Ref Range: 150-400 K/uL 231  Neutrophils Latest Units: % 64  Lymphocytes Latest Units: % 19  Monocytes Relative Latest Units: % 14  Eosinophil Latest Units: % 3  Basophil Latest Units: % 0  NEUT# Latest Ref Range: 1.7-7.7 K/uL 4.0  Lymphocyte # Latest Ref Range: 0.7-4.0 K/uL 1.2  Monocyte # Latest Ref Range: 0.1-1.0 K/uL 0.9  Eosinophils Absolute Latest Ref Range: 0.0-0.7 K/uL 0.2  Basophils Absolute Latest Ref Range: 0.0-0.1 K/uL 0.0    PATHOLOGY:     ASSESSMENT AND PLAN:  Stage IVB Ovarian Carcinoma Malignant Ascites Anemia Leukocytosis Stool cards negative X 3   She has done very well with therapy. She achieved an R0 resection at the time of surgery. Note that CT imaging of the chest at diagnosis in August suggested metastatic disease in the chest. She has completed all recommended therapy.  She has a significant anemia. Peripheral evaluation has been unrevealing. She has evidence of chronic kidney disease but certainly not to the point that I feel could explain her degree of anemia.   Her last blood  transfusion was on 10/16/15.  I have refilled her medications today.  She will return for CBC in 2 weeks. If her labs are not improved, she will need a bone marrow biopsy for further evaluation.  She will return for follow up in 2 months with labs. .  All questions were answered. The patient knows to call the clinic with any problems, questions or concerns. We can certainly see the patient much sooner if necessary.  This document serves as a record of services personally performed by Ancil Linsey, MD. It was created on her behalf by Arlyce Harman, a trained medical scribe. The creation of this record is based on the scribe's personal observations and the provider's statements to them. This document has been checked and approved by the attending provider.  I have reviewed the above documentation for accuracy and completeness, and I agree with the above.  This note is electronically signed [id number] Cyril Mourning, MD  11:45 AM

## 2015-11-05 LAB — CA 125: CA 125: 8 U/mL (ref 0.0–38.1)

## 2015-11-18 ENCOUNTER — Encounter (HOSPITAL_COMMUNITY): Payer: Medicare Other | Attending: Oncology

## 2015-11-18 DIAGNOSIS — I10 Essential (primary) hypertension: Secondary | ICD-10-CM | POA: Insufficient documentation

## 2015-11-18 DIAGNOSIS — D649 Anemia, unspecified: Secondary | ICD-10-CM

## 2015-11-18 DIAGNOSIS — E538 Deficiency of other specified B group vitamins: Secondary | ICD-10-CM | POA: Insufficient documentation

## 2015-11-18 DIAGNOSIS — Z7982 Long term (current) use of aspirin: Secondary | ICD-10-CM | POA: Insufficient documentation

## 2015-11-18 DIAGNOSIS — Z79899 Other long term (current) drug therapy: Secondary | ICD-10-CM | POA: Insufficient documentation

## 2015-11-18 DIAGNOSIS — C786 Secondary malignant neoplasm of retroperitoneum and peritoneum: Secondary | ICD-10-CM | POA: Insufficient documentation

## 2015-11-18 DIAGNOSIS — E782 Mixed hyperlipidemia: Secondary | ICD-10-CM | POA: Diagnosis not present

## 2015-11-18 DIAGNOSIS — C771 Secondary and unspecified malignant neoplasm of intrathoracic lymph nodes: Secondary | ICD-10-CM | POA: Insufficient documentation

## 2015-11-18 DIAGNOSIS — E559 Vitamin D deficiency, unspecified: Secondary | ICD-10-CM | POA: Insufficient documentation

## 2015-11-18 DIAGNOSIS — C569 Malignant neoplasm of unspecified ovary: Secondary | ICD-10-CM | POA: Insufficient documentation

## 2015-11-18 LAB — CBC WITH DIFFERENTIAL/PLATELET
BASOS ABS: 0 10*3/uL (ref 0.0–0.1)
BASOS PCT: 0 %
Eosinophils Absolute: 0.2 10*3/uL (ref 0.0–0.7)
Eosinophils Relative: 3 %
HEMATOCRIT: 29.2 % — AB (ref 36.0–46.0)
HEMOGLOBIN: 9.8 g/dL — AB (ref 12.0–15.0)
LYMPHS PCT: 16 %
Lymphs Abs: 1.1 10*3/uL (ref 0.7–4.0)
MCH: 35.8 pg — ABNORMAL HIGH (ref 26.0–34.0)
MCHC: 33.6 g/dL (ref 30.0–36.0)
MCV: 106.6 fL — AB (ref 78.0–100.0)
MONO ABS: 0.6 10*3/uL (ref 0.1–1.0)
Monocytes Relative: 8 %
NEUTROS ABS: 5.1 10*3/uL (ref 1.7–7.7)
NEUTROS PCT: 73 %
PLATELETS: 242 10*3/uL (ref 150–400)
RBC: 2.74 MIL/uL — AB (ref 3.87–5.11)
RDW: 15.4 % (ref 11.5–15.5)
WBC: 7 10*3/uL (ref 4.0–10.5)

## 2015-11-26 ENCOUNTER — Encounter (HOSPITAL_COMMUNITY): Payer: Medicare Other

## 2015-11-26 ENCOUNTER — Encounter (HOSPITAL_COMMUNITY): Payer: Self-pay | Admitting: Genetic Counselor

## 2015-11-26 ENCOUNTER — Encounter (HOSPITAL_BASED_OUTPATIENT_CLINIC_OR_DEPARTMENT_OTHER): Payer: Medicare Other | Admitting: Genetic Counselor

## 2015-11-26 DIAGNOSIS — Z315 Encounter for genetic counseling: Secondary | ICD-10-CM

## 2015-11-26 DIAGNOSIS — C562 Malignant neoplasm of left ovary: Secondary | ICD-10-CM | POA: Diagnosis not present

## 2015-11-26 DIAGNOSIS — C569 Malignant neoplasm of unspecified ovary: Secondary | ICD-10-CM

## 2015-11-26 NOTE — Progress Notes (Signed)
REFERRING PROVIDER: Ancil Linsey, MD  PRIMARY PROVIDER:  No PCP Per Patient  PRIMARY REASON FOR VISIT:  1. Ovarian cancer, unspecified laterality ([place])      HISTORY OF PRESENT ILLNESS:   Latasha [name], a 75 y.o. female, was seen for a [facility] cancer genetics consultation at the request of Dr. Whitney Muse due to a personal history of cancer.  Latasha [name] presents to clinic today to discuss the possibility of a hereditary predisposition to cancer, genetic testing, and to further clarify her future cancer risks, as well as potential cancer risks for family members.   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 This was treated with surgery and chemotherapy.     CANCER HISTORY:    Ovarian cancer ([place])   04/25/2015 - 04/28/2015 Hospital Admission Nausea/diarrhea.  Oncology consult completed on 04/27/2015.   04/25/2015 Tumor Marker CA 125- 3902.      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   04/27/2015 Procedure US Paracentesis- A total of approximately 3700 mL of amber colored fluid was removed. A fluid sample was sent for laboratory analysis.   04/27/2015 Imaging CT Chest- Mild left supraclavicular lymphadenopathy and moderate mediastinal lymphadenopathy involving the prevascular, subcarinal and bilateral pericardiophrenic nodal chains, likely metastatic.   04/27/2015 Pathology Results Diagnosis PERITONEAL/ASCITIC FLUID(SPECIMEN 1 OF 1 COLLECTED 04/27/15): MALIGNANT CELLS CONSISTENT WITH METASTATIC ADENOCARCINOMA.  The immunophenotype is most consistent with a gynecologic primary, most likely ovary.   05/08/2015 Miscellaneous Seen by Dr. Denman George- recommending Carboplatin/Paclitaxel x 3 cycles and return visit to see her (within 1 week of administration of third cycle) to evaluate for optimal sequencing of  treatment modalities.   05/13/2015 - 05/15/2015 Hospital Admission Hospitalized for AKI   05/19/2015 - 07/01/2015 Chemotherapy Carboplatin/Paclitaxel x 3 cycles   07/28/2015 Surgery Exploratory laparotomy with total abdominal hysterectomy, bilateral salpingo-oophorectomy, omentectomy radical tumor debulking for ovarian cancer; by Dr. Everitt Amber.   07/28/2015 Pathology Results Uterus +/- tubes/ovaries, neoplastic, cervix - HIGH GRADE SEROUS CARCINOMA INVOLVING LEFT OVARY, LEFT FALLOPIAN TUBE AND RIGHT FALLOPIAN TUBE. - CERVIX, ENDOMETRIUM AND MYOMETRIUM ARE FREE OF TUMOR. 2. Cul-de-sac biop...   08/26/2015 -  Chemotherapy Carboplatin/Paclitaxel x 3 cycles     HORMONAL RISK FACTORS:  Menarche was at age 66.  First live birth at age 25.  OCP use for approximately ~5 years.  Ovaries intact: no.  Hysterectomy: yes.  Menopausal status: postmenopausal.  HRT use: 0 years. Colonoscopy: yes; some colon polyps. Mammogram within the last year: yes. Number of breast biopsies: 0. Up to date with pelvic exams:  no. Any excessive radiation exposure in the past:  no  Past Medical History  Diagnosis Date  . Hypertension   . Schizophrenia ([place])   . 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 Ulcerative colitis ([place])   . Mixed hyperlipidemia   . Vitamin D deficiency   . B12 deficiency   . Iron deficiency anemia   . Ovarian cancer ([place])   . Cataract   . Arthritis   . History of blood transfusion   . History of chemotherapy     Past Surgical History  Procedure Laterality Date  . Replacement total knee      right knee in 2003  . Paracentesis  04/27/15  . Portacath placement Right 04/2015  . Laparotomy N/A 07/28/2015    Procedure: EXPLORATORY LAPAROTOMY;  Surgeon:  Everitt Amber, MD;  Location: [facility];  Service: Gynecology;  Laterality: N/A;  . Abdominal hysterectomy N/A 07/28/2015    Procedure: TOTAL HYSTERECTOMY ABDOMINAL BILATERAL SALPINGO OOPHRORECTOMY;  Surgeon: Everitt Amber, MD;  Location: [facility];  Service:  Gynecology;  Laterality: N/A;  . Omentectomy N/A 07/28/2015    Procedure: OMENTECTOMY ;  Surgeon: Everitt Amber, MD;  Location: [facility];  Service: Gynecology;  Laterality: N/A;  . Debulking N/A 07/28/2015    Procedure: DEBULKING;  Surgeon: Everitt Amber, MD;  Location: [facility];  Service: Gynecology;  Laterality: N/A;    Social History   Social History  . Marital Status: Single    Spouse Name: N/A  . Number of Children: N/A  . Years of Education: N/A   Social History Main Topics  . Smoking status: Never Smoker   . Smokeless tobacco: Never Used  . Alcohol Use: No  . Drug Use: No  . Sexual Activity: Not Currently   Other Topics Concern  . None   Social History Narrative     FAMILY HISTORY:  We obtained a detailed, 4-generation family history.  Significant diagnoses are listed below: Family History  Problem Relation Age of Onset  . Prostate cancer Brother   . Cancer Paternal Uncle     1 uncle with cancer NOS  . Lung cancer Maternal Grandmother   . Hypertension Maternal Grandfather   . Congestive Heart Failure Mother   . Seizures Sister     The patient has one son who is healthy.  She has six siblings, two of whom have died of non cancer related issues.  Her parents are both deceased.  Her mother died of CHF and her farther died of a heart attack.  Her mother had three siblings and her father had three siblings.  Her father had a brother with a cancer NOS.  There is no other reported family history of cancer.  Patient's maternal ancestors are of African American descent, and paternal ancestors are of African American descent. There is no reported Ashkenazi Jewish ancestry. There is no known consanguinity.  GENETIC COUNSELING ASSESSMENT: Latasha [name] is a 75 y.o. female with a personal history of ovarian cancer which is somewhat suggestive of a hereditary cancer syndrome and predisposition to cancer. We, therefore, discussed and recommended the following at today's visit.    DISCUSSION: We discussed that about 15-20% of ovarian cancer is due to a hereditary cancer syndrome, most commonly either BRCA mutations or Lynch syndrome.  We discussed that there are other genes that can increase the risk for ovarian cancer.  We reviewed the characteristics, features and inheritance patterns of hereditary cancer syndromes. We also discussed genetic testing, including the appropriate family members to test, the process of testing, insurance coverage and turn-around-time for results. We discussed the implications of a negative, positive and/or variant of uncertain significant result. We recommended Latasha [name] pursue genetic testing for the Breast/Ovarian cancer gene panel. The Breast/Ovarian gene panel offered by [facility] includes sequencing and rearrangement analysis for the following 20 genes:  ATM, BARD1, BRCA1, BRCA2, BRIP1, CDH1, CHEK2, EPCAM, FANCC, MLH1, MSH2, MSH6, NBN, PALB2, PMS2, PTEN, RAD51C, RAD51D, TP53, and XRCC2.     Based on Latasha [name]'s personal history of cancer, she meets medical criteria for genetic testing. Despite that she meets criteria, she may still have an out of pocket cost. We discussed that if her out of pocket cost for testing is over $100, the laboratory will call and confirm whether she wants to proceed with  testing.  If the out of pocket cost of testing is less than $100 she will be billed by the genetic testing laboratory.   PLAN: After considering the risks, benefits, and limitations, Latasha [name]  provided informed consent to pursue genetic testing and the blood sample was sent to [facility] for analysis of the Breast/Ovarian cancer panel. Results should be available within approximately 2-3 weeks' time, at which point they will be disclosed by telephone to Latasha [name], as will any additional recommendations warranted by these results. Latasha [name] will receive a summary of her genetic counseling visit and a copy of her results once  available. This information will also be available in Epic. We encouraged Latasha [name] to remain in contact with cancer genetics annually so that we can continuously update the family history and inform her of any changes in cancer genetics and testing that may be of benefit for her family. Latasha [name] questions were answered to her satisfaction today. Our contact information was provided should additional questions or concerns arise.  Lastly, we encouraged Latasha [name] to remain in contact with cancer genetics annually so that we can continuously update the family history and inform her of any changes in cancer genetics and testing that may be of benefit for this family.   [name] questions were answered to her satisfaction today. Our contact information was provided should additional questions or concerns arise. Thank you for the referral and allowing [place] to share in the care of your patient.   Erven Ramson P. Florene Glen, [place], [facility] Certified Genetic Counselor [email or web address] phone: [phone]  The patient was seen for a total of 45 minutes in face-to-face genetic counseling.  This patient was discussed with Drs. Magrinat, Lindi Adie and/or Burr Medico who agrees with the above.    _______________________________________________________________________ For Office Staff:  Number of people involved in session: 1 Was an Intern/ student involved with case: no

## 2015-12-02 ENCOUNTER — Encounter (HOSPITAL_BASED_OUTPATIENT_CLINIC_OR_DEPARTMENT_OTHER): Payer: Medicare Other

## 2015-12-02 VITALS — BP 143/70 | HR 68 | Temp 98.2°F | Resp 16

## 2015-12-02 DIAGNOSIS — R110 Nausea: Secondary | ICD-10-CM

## 2015-12-02 DIAGNOSIS — C562 Malignant neoplasm of left ovary: Secondary | ICD-10-CM | POA: Diagnosis not present

## 2015-12-02 DIAGNOSIS — Z452 Encounter for adjustment and management of vascular access device: Secondary | ICD-10-CM | POA: Diagnosis not present

## 2015-12-02 MED ORDER — SODIUM CHLORIDE 0.9% FLUSH
10.0000 mL | INTRAVENOUS | Status: DC | PRN
  Administered 2015-12-02: 10 mL via INTRAVENOUS
  Filled 2015-12-02: qty 10

## 2015-12-02 MED ORDER — HEPARIN SOD (PORK) LOCK FLUSH 100 UNIT/ML IV SOLN
500.0000 [IU] | Freq: Once | INTRAVENOUS | Status: AC
  Administered 2015-12-02: 500 [IU] via INTRAVENOUS

## 2015-12-02 NOTE — Progress Notes (Signed)
Latasha [name] presented for Portacath access and flush. Proper placement of portacath confirmed by CXR. Portacath located right chest wall accessed with  H 20 needle. Good blood return present. Portacath flushed with 38m NS and 500U/591mHeparin and needle removed intact. Procedure without incident. Patient tolerated procedure well.

## 2015-12-02 NOTE — Patient Instructions (Signed)
[place] at [facility] Discharge Instructions  RECOMMENDATIONS MADE BY THE CONSULTANT AND ANY TEST RESULTS WILL BE SENT TO YOUR REFERRING PHYSICIAN.  Port flush today.    Thank you for choosing [place] at [facility] to provide your oncology and hematology care.  To afford each patient quality time with our provider, please arrive at least 15 minutes before your scheduled appointment time.   Beginning January 23rd 2017 lab work for the [facility] will be done in the  Main lab at [facility] on 1st floor. If you have a lab appointment with the [facility] please come in thru the  Main Entrance and check in at the main information desk  You need to re-schedule your appointment should you arrive 10 or more minutes late.  We strive to give you quality time with our providers, and arriving late affects you and other patients whose appointments are after yours.  Also, if you no show three or more times for appointments you may be dismissed from the clinic at the providers discretion.     Again, thank you for choosing [facility].  Our hope is that these requests will decrease the amount of time that you wait before being seen by our physicians.       _____________________________________________________________  Should you have questions after your visit to [facility], please contact our office at [phone] between the hours of 8:30 a.m. and 4:30 p.m.  Voicemails left after 4:30 p.m. will not be returned until the following business day.  For prescription refill requests, have your pharmacy contact our office.         Resources For Cancer Patients and their Caregivers ? American Cancer Society: Can assist with transportation, wigs, general needs, runs Look Good Feel Better.        340-280-7387 ? Cancer Care: Provides financial assistance, online support groups, medication/co-pay assistance.   1-800-813-HOPE 917 699 0431) ? [facility] Assists [place] cancer patients and their families through emotional , educational and financial support.  2138604999 ? [place] DSS Where to apply for food stamps, Medicaid and utility assistance. 223-367-3791 ? RCATS: Transportation to medical appointments. 343-401-1950 ? Social Security Administration: May apply for disability if have a Stage IV cancer. [phone] ? [employer], Disability and Transit Services: Assists with nutrition, care and transit needs. [phone]

## 2015-12-15 ENCOUNTER — Telehealth: Payer: Self-pay | Admitting: Genetic Counselor

## 2015-12-15 ENCOUNTER — Encounter: Payer: Self-pay | Admitting: Genetic Counselor

## 2015-12-15 DIAGNOSIS — Z1379 Encounter for other screening for genetic and chromosomal anomalies: Secondary | ICD-10-CM | POA: Insufficient documentation

## 2015-12-15 NOTE — Telephone Encounter (Signed)
No answer.  The phone just rang.

## 2015-12-17 ENCOUNTER — Telehealth: Payer: Self-pay | Admitting: Genetic Counselor

## 2015-12-17 NOTE — Telephone Encounter (Signed)
Revealed negative genetic testing.  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  Will call back when this is reclassified.

## 2015-12-23 ENCOUNTER — Ambulatory Visit: Payer: Self-pay | Admitting: Genetic Counselor

## 2015-12-23 DIAGNOSIS — C569 Malignant neoplasm of unspecified ovary: Secondary | ICD-10-CM

## 2015-12-23 DIAGNOSIS — Z1379 Encounter for other screening for genetic and chromosomal anomalies: Secondary | ICD-10-CM

## 2015-12-23 NOTE — Progress Notes (Addendum)
HPI: Ms. Frary was previously seen in the [place] clinic due to a personal history of cancer and concerns regarding a hereditary predisposition to cancer. Please refer to our prior cancer genetics clinic note for more information regarding Ms. Caudle's medical, social and family histories, and our assessment and recommendations, at the time. Ms. Primeau recent genetic test results were disclosed to her, as were recommendations warranted by these results. These results and recommendations are discussed in more detail below.  FAMILY HISTORY:  We obtained a detailed, 4-generation family history.  Significant diagnoses are listed below: Family History  Problem Relation Age of Onset  . Prostate cancer Brother   . Cancer Paternal Uncle     1 uncle with cancer NOS  . Lung cancer Maternal Grandmother   . Hypertension Maternal Grandfather   . Congestive Heart Failure Mother   . Seizures Sister     The patient has one son who is healthy. She has six siblings, two of whom have died of non cancer related issues. Her parents are both deceased. Her mother died of CHF and her farther died of a heart attack. Her mother had three siblings and her father had three siblings. Her father had a brother with a cancer NOS. There is no other reported family history of cancer. Patient's maternal ancestors are of African American descent, and paternal ancestors are of African American descent. There is no reported Ashkenazi Jewish ancestry. There is no known consanguinity.  GENETIC TEST RESULTS: At the time of Ms. Felix's visit, we recommended she pursue genetic testing of the Breast/Ovarian cancer gene panel. The Breast/Ovarian gene panel offered by [facility] includes sequencing and rearrangement analysis for the following 20 genes:  ATM, BARD1, BRCA1, BRCA2, BRIP1, CDH1, CHEK2, EPCAM, FANCC, MLH1, MSH2, MSH6, NBN, PALB2, PMS2, PTEN, RAD51C, RAD51D, TP53, and XRCC2.   The report  date is January 14, 2016.  Genetic testing was normal, and did not reveal a deleterious mutation in these genes. The test report has been scanned into EPIC and is located under the Molecular Pathology section of the Results Review tab.   We discussed with Ms. Hofferber that since the current genetic testing is not perfect, it is possible there may be a gene mutation in one of these genes that current testing cannot detect, but that chance is small. We also discussed, that it is possible that another gene that has not yet been discovered, or that we have not yet tested, is responsible for the cancer diagnoses in the family, and it is, therefore, important to remain in touch with cancer genetics in the future so that we can continue to offer Ms. Hilscher the most up to date genetic testing.   Genetic testing did detect a Variant of Unknown Significance in the MSH6 gene called c.2667G>T. At this time, it is unknown if this variant is associated with increased cancer risk or if this is a normal finding, but most variants such as this get reclassified to being inconsequential. It should not be used to make medical management decisions. With time, we suspect the lab will determine the significance of this variant, if any. If we do learn more about it, we will try to contact Ms. Chilcote to discuss it further. However, it is important to stay in touch with [place] periodically and keep the address and phone number up to date.   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  The updated report date is October 29, 2019.  CANCER SCREENING RECOMMENDATIONS: This result is reassuring and indicates that Ms. Suchy likely does not have an increased risk for a future cancer due to a mutation in one of these genes. This normal test  also suggests that Ms. Enge's cancer was most likely not due to an inherited predisposition associated with one of these genes.  Most cancers happen by chance and this negative test suggests that her cancer falls into this category.  We, therefore, recommended she continue to follow the cancer management and screening guidelines provided by her oncology and primary healthcare provider.   RECOMMENDATIONS FOR FAMILY MEMBERS: Women in this family might be at some increased risk of developing cancer, over the general population risk, simply due to the family history of cancer. We recommended women in this family have a yearly mammogram beginning at age 67, or 10 years younger than the earliest onset of cancer, an an annual clinical breast exam, and perform monthly breast self-exams. Women in this family should also have a gynecological exam as recommended by their primary provider. All family members should have a colonoscopy by age 63.  FOLLOW-UP: Lastly, we discussed with Ms. Edelen that cancer genetics is a rapidly advancing field and it is possible that new genetic tests will be appropriate for her and/or her family members in the future. We encouraged her to remain in contact with cancer genetics on an annual basis so we can update her personal and family histories and let her know of advances in cancer genetics that may benefit this family.   Our contact number was provided. Ms. Tuley questions were answered to her satisfaction, and she knows she is welcome to call us at anytime with additional questions or concerns.   Roma Kayser, MS, [facility] Certified Genetic Counselor [email or web address]

## 2016-01-05 ENCOUNTER — Encounter (HOSPITAL_COMMUNITY): Payer: Medicare Other | Attending: Hematology & Oncology | Admitting: Hematology & Oncology

## 2016-01-05 ENCOUNTER — Encounter (HOSPITAL_COMMUNITY): Payer: Medicare Other | Attending: Hematology & Oncology

## 2016-01-05 ENCOUNTER — Encounter (HOSPITAL_COMMUNITY): Payer: Self-pay | Admitting: Hematology & Oncology

## 2016-01-05 VITALS — BP 154/81 | HR 66 | Temp 97.8°F | Resp 18 | Wt 133.0 lb

## 2016-01-05 DIAGNOSIS — D72829 Elevated white blood cell count, unspecified: Secondary | ICD-10-CM | POA: Diagnosis not present

## 2016-01-05 DIAGNOSIS — D7589 Other specified diseases of blood and blood-forming organs: Secondary | ICD-10-CM | POA: Insufficient documentation

## 2016-01-05 DIAGNOSIS — C562 Malignant neoplasm of left ovary: Secondary | ICD-10-CM | POA: Diagnosis present

## 2016-01-05 DIAGNOSIS — D649 Anemia, unspecified: Secondary | ICD-10-CM

## 2016-01-05 DIAGNOSIS — D630 Anemia in neoplastic disease: Secondary | ICD-10-CM

## 2016-01-05 DIAGNOSIS — C569 Malignant neoplasm of unspecified ovary: Secondary | ICD-10-CM | POA: Insufficient documentation

## 2016-01-05 DIAGNOSIS — C786 Secondary malignant neoplasm of retroperitoneum and peritoneum: Secondary | ICD-10-CM | POA: Diagnosis not present

## 2016-01-05 DIAGNOSIS — C7989 Secondary malignant neoplasm of other specified sites: Secondary | ICD-10-CM

## 2016-01-05 DIAGNOSIS — R180 Malignant ascites: Secondary | ICD-10-CM | POA: Diagnosis not present

## 2016-01-05 LAB — COMPREHENSIVE METABOLIC PANEL
ALBUMIN: 4.3 g/dL (ref 3.5–5.0)
ALT: 22 U/L (ref 14–54)
AST: 21 U/L (ref 15–41)
Alkaline Phosphatase: 62 U/L (ref 38–126)
Anion gap: 8 (ref 5–15)
BILIRUBIN TOTAL: 0.5 mg/dL (ref 0.3–1.2)
BUN: 13 mg/dL (ref 6–20)
CO2: 31 mmol/L (ref 22–32)
Calcium: 9.1 mg/dL (ref 8.9–10.3)
Chloride: 105 mmol/L (ref 101–111)
Creatinine, Ser: 1.18 mg/dL — ABNORMAL HIGH (ref 0.44–1.00)
GFR calc Af Amer: 52 mL/min — ABNORMAL LOW (ref >60)
GFR calc non Af Amer: 45 mL/min — ABNORMAL LOW (ref >60)
GLUCOSE: 97 mg/dL (ref 65–99)
POTASSIUM: 3.6 mmol/L (ref 3.5–5.1)
Sodium: 144 mmol/L (ref 135–145)
TOTAL PROTEIN: 7.2 g/dL (ref 6.5–8.1)

## 2016-01-05 LAB — CBC WITH DIFFERENTIAL/PLATELET
Basophils Absolute: 0 10*3/uL (ref 0.0–0.1)
Basophils Relative: 1 %
EOS PCT: 5 %
Eosinophils Absolute: 0.2 10*3/uL (ref 0.0–0.7)
HEMATOCRIT: 29.8 % — AB (ref 36.0–46.0)
Hemoglobin: 10.2 g/dL — ABNORMAL LOW (ref 12.0–15.0)
LYMPHS PCT: 24 %
Lymphs Abs: 1 10*3/uL (ref 0.7–4.0)
MCH: 36.3 pg — AB (ref 26.0–34.0)
MCHC: 34.2 g/dL (ref 30.0–36.0)
MCV: 106 fL — AB (ref 78.0–100.0)
MONO ABS: 0.5 10*3/uL (ref 0.1–1.0)
MONOS PCT: 11 %
NEUTROS ABS: 2.5 10*3/uL (ref 1.7–7.7)
Neutrophils Relative %: 59 %
PLATELETS: 230 10*3/uL (ref 150–400)
RBC: 2.81 MIL/uL — ABNORMAL LOW (ref 3.87–5.11)
RDW: 12.4 % (ref 11.5–15.5)
WBC: 4.2 10*3/uL (ref 4.0–10.5)

## 2016-01-05 NOTE — Patient Instructions (Addendum)
[place] at [facility] Discharge Instructions  RECOMMENDATIONS MADE BY THE CONSULTANT AND ANY TEST RESULTS WILL BE SENT TO YOUR REFERRING PHYSICIAN.   Exam and discussion by Dr Whitney Muse today Port flush every 8 weeks Labs are stable Labs in 3 months Return to see the doctor in 3 months  Please call the clinic if you have any questions or concerns     Thank you for choosing [place] at [facility] to provide your oncology and hematology care.  To afford each patient quality time with our provider, please arrive at least 15 minutes before your scheduled appointment time.   Beginning January 23rd 2017 lab work for the [facility] will be done in the  Main lab at [facility] on 1st floor. If you have a lab appointment with the [facility] please come in thru the  Main Entrance and check in at the main information desk  You need to re-schedule your appointment should you arrive 10 or more minutes late.  We strive to give you quality time with our providers, and arriving late affects you and other patients whose appointments are after yours.  Also, if you no show three or more times for appointments you may be dismissed from the clinic at the providers discretion.     Again, thank you for choosing [facility].  Our hope is that these requests will decrease the amount of time that you wait before being seen by our physicians.       _____________________________________________________________  Should you have questions after your visit to [facility], please contact our office at [phone] between the hours of 8:30 a.m. and 4:30 p.m.  Voicemails left after 4:30 p.m. will not be returned until the following business day.  For prescription refill requests, have your pharmacy contact our office.         Resources For Cancer Patients and their Caregivers ? American Cancer Society: Can assist with  transportation, wigs, general needs, runs Look Good Feel Better.        352-571-0078 ? Cancer Care: Provides financial assistance, online support groups, medication/co-pay assistance.  1-800-813-HOPE 581-248-2206) ? [facility] Assists [place] cancer patients and their families through emotional , educational and financial support.  850-074-7969 ? [place] DSS Where to apply for food stamps, Medicaid and utility assistance. (810)018-6354 ? RCATS: Transportation to medical appointments. ([phone] ? Social Security Administration: May apply for disability if have a Stage IV cancer. 515-306-0703 332-329-9173 ? [employer], Disability and Transit Services: Assists with nutrition, care and transit needs. [phone]

## 2016-01-05 NOTE — Progress Notes (Signed)
[place] at [place] NOTE  No PCP Per Patient No address on file    Ovarian cancer [facility])   04/25/2015 - 04/28/2015 Hospital Admission Nausea/diarrhea.  Oncology consult completed on 04/27/2015.   04/25/2015 Tumor Marker CA 125- 3902.      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   04/27/2015 Procedure US Paracentesis- A total of approximately 3700 mL of amber colored fluid was removed. A fluid sample was sent for laboratory analysis.   04/27/2015 Imaging CT Chest- Mild left supraclavicular lymphadenopathy and moderate mediastinal lymphadenopathy involving the prevascular, subcarinal and bilateral pericardiophrenic nodal chains, likely metastatic.   04/27/2015 Pathology Results Diagnosis PERITONEAL/ASCITIC FLUID(SPECIMEN 1 OF 1 COLLECTED 04/27/15): MALIGNANT CELLS CONSISTENT WITH METASTATIC ADENOCARCINOMA.  The immunophenotype is most consistent with a gynecologic primary, most likely ovary.   05/08/2015 Miscellaneous Seen by Dr. Denman George- recommending Carboplatin/Paclitaxel x 3 cycles and return visit to see her (within 1 week of administration of third cycle) to evaluate for optimal sequencing of treatment modalities.   05/13/2015 - 05/15/2015 Hospital Admission Hospitalized for AKI   05/19/2015 - 07/01/2015 Chemotherapy Carboplatin/Paclitaxel x 3 cycles   07/28/2015 Surgery Exploratory laparotomy with total abdominal hysterectomy, bilateral salpingo-oophorectomy, omentectomy radical tumor debulking for ovarian cancer; by Dr. Everitt Amber.   07/28/2015 Pathology Results Uterus +/- tubes/ovaries, neoplastic, cervix - HIGH GRADE SEROUS CARCINOMA INVOLVING LEFT OVARY, LEFT FALLOPIAN TUBE AND RIGHT FALLOPIAN TUBE. - CERVIX, ENDOMETRIUM AND MYOMETRIUM ARE FREE OF TUMOR. 2. Cul-de-sac biop...   08/26/2015 -  Chemotherapy  Carboplatin/Paclitaxel x 3 cycles   12/23/2015 Miscellaneous Genetic counseling.  Genetic testing was normal, and did not reveal a deleterious mutation in these genes    CURRENT THERAPY: Carboplatin/Paclitaxel every 21 days beginning on 05/19/2015.  INTERVAL HISTORY: Latasha [name] 75 y.o. 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  Latasha [name] returns to the [facility] accompanied by her son. Her blood count today looks good. Her CA-125 is not back yet.  She says she feels okay. As usual, she is laughing and in very cheerful spirits. She says some days she feels really good, with no real bad days. She still has days where she doesn't feel quite as good. She notes "every day ain't the same," but she really doesn't feel bad.  She denies any new pain, and says she's been eating better. She says with a hint of disappointment, "I thought I gained more weight," and that her son tells her she needs to eat more than what she's eating. Her son confirms that she eats like a bird. Latasha [name] says "I'll be honest with you, I ain't never been a real big eater." Luckily, things do still taste good to her, but she repeats that she never really eats a lot.  Her bowels are okay and she notes "I use the bathroom every day." She confirms having no trouble with this.  In terms of activity, she says that lately she's been in her flower garden, especially as the weather's gotten better. She says she washes dishes, and washes and folds her son's clothes as well. She says she is going to try mowing the grass with the push mower when that needs to happen, and was advised that she can certainly do  it if she feels physically able, as well as to be mindful of the heat and do it early in the morning or later in the evening. She was advised that she may do anything she enjoys doing, as  long as she's sensible about it.  During the physical exam, she confirms that her fingers and toes feel okay, and that she can button her shirt okay.  She says "really, sometimes I just feel good, like my normal self." She feels she has more days like this lately than before. She knows that if she ever starts feeling bad again, especially compared to how good she feels now, she should come in earlier for check up.   Past Medical History  Diagnosis Date  . Hypertension   . Schizophrenia ([place])   . 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 Ulcerative colitis ([place])   . Mixed hyperlipidemia   . Vitamin D deficiency   . B12 deficiency   . Iron deficiency anemia   . Ovarian cancer ([place])   . Cataract   . Arthritis   . History of blood transfusion   . History of chemotherapy     has Renal failure; Nausea without vomiting; Ascites, malignant; Ovarian cancer ([place]); Omental mass; Acute kidney injury ([place]); UTI (lower urinary tract infection); Normocytic anemia; IBD (inflammatory bowel disease); Schizophrenia, unspecified type ([place]); Diarrhea; Malnutrition of moderate degree ([place]); Acute renal failure ([place]); Ileus, postoperative; and Genetic testing on her problem list.     has No Known Allergies.  Current Outpatient Prescriptions on File Prior to Visit  Medication Sig Dispense Refill  . acetaminophen (TYLENOL) 500 MG tablet Take 2 tablets (1,000 mg total) by mouth every 12 (twelve) hours. 30 tablet 0  . amLODipine (NORVASC) 10 MG tablet Take 10 mg by mouth every evening.     [place] Kitchen aspirin EC 81 MG tablet Take 81 mg by mouth daily.    . Cholecalciferol (VITAMIN D-3) 1000 UNITS CAPS Take 1,000 Units by mouth daily.     [place] Kitchen docusate sodium (COLACE) 100 MG capsule Take 100 mg by mouth 2 (two) times daily.    . ferrous sulfate 325 (65 FE) MG EC tablet Take 325 mg by mouth 2 (two) times daily.    . folic acid (FOLVITE) 1 MG tablet Take 1 mg by mouth daily.    Mar[place] Kitchen lidocaine-prilocaine (EMLA) cream Apply a quarter size  amount to port site 1 hour prior to chemo. Do not rub in. Cover with plastic wrap. 30 g 3  . lovastatin (MEVACOR) 10 MG tablet Take 10 mg by mouth at bedtime.    . megestrol (MEGACE) 400 MG/10ML suspension Take 10 mLs (400 mg total) by mouth daily. 240 mL 2  . metoprolol tartrate (LOPRESSOR) 25 MG tablet Take 1 tablet (25 mg total) by mouth 2 (two) times daily. 60 tablet 6  . mirtazapine (REMERON) 15 MG tablet Take 7.5 mg by mouth at bedtime.    . Multiple Vitamin (MULTIVITAMIN WITH MINERALS) TABS tablet Take 1 tablet by mouth daily.    . ondansetron (ZOFRAN) 4 MG tablet Take 1 tablet (4 mg total) by mouth every 6 (six) hours as needed for nausea. (Patient not taking: Reported on 10/07/2015) 20 tablet 0  . ondansetron (ZOFRAN) 8 MG tablet Take 1 tablet (8 mg total) by mouth every 8 (eight) hours as needed for nausea or vomiting. (Patient not taking: Reported on 11/04/2015) 30 tablet 2  . oxyCODONE-acetaminophen (PERCOCET/ROXICET) 5-325 MG tablet Take 1 tablet by mouth every 4 (  four) hours as needed for severe pain. 60 tablet 0  . prochlorperazine (COMPAZINE) 10 MG tablet Take 1 tablet (10 mg total) by mouth every 6 (six) hours as needed (Nausea or vomiting). (Patient not taking: Reported on 10/07/2015) 30 tablet 1  . QUEtiapine (SEROQUEL XR) 300 MG 24 hr tablet Take 300 mg by mouth at bedtime.    . sulfaSALAzine (AZULFIDINE) 500 MG tablet Take 500 mg by mouth 3 (three) times daily.    . traMADol (ULTRAM) 50 MG tablet Take 1 tablet (50 mg total) by mouth every 6 (six) hours as needed. 30 tablet 1  . vitamin B-12 (CYANOCOBALAMIN) 1000 MCG tablet Take 1,000 mcg by mouth daily.     No current facility-administered medications on file prior to visit.    Past Surgical History  Procedure Laterality Date  . Replacement total knee      right knee in 2003  . Paracentesis  04/27/15  . Portacath placement Right 04/2015  . Laparotomy N/A 07/28/2015    Procedure: EXPLORATORY LAPAROTOMY;  Surgeon: Everitt Amber, MD;   Location: [facility];  Service: Gynecology;  Laterality: N/A;  . Abdominal hysterectomy N/A 07/28/2015    Procedure: TOTAL HYSTERECTOMY ABDOMINAL BILATERAL SALPINGO OOPHRORECTOMY;  Surgeon: Everitt Amber, MD;  Location: [facility];  Service: Gynecology;  Laterality: N/A;  . Omentectomy N/A 07/28/2015    Procedure: OMENTECTOMY ;  Surgeon: Everitt Amber, MD;  Location: [facility];  Service: Gynecology;  Laterality: N/A;  . Debulking N/A 07/28/2015    Procedure: DEBULKING;  Surgeon: Everitt Amber, MD;  Location: [facility];  Service: Gynecology;  Laterality: N/A;    Denies any headaches, dizziness, double vision, fevers, chills, night sweats, nausea, vomiting, diarrhea, constipation, chest pain, heart palpitations, shortness of breath, blood in stool, black tarry stool, urinary pain, urinary burning, urinary frequency, hematuria. Positive for Left knee pain, intermittent.  14 point review of systems was performed and is negative except as detailed under history of present illness and above   PHYSICAL EXAMINATION  ECOG PERFORMANCE STATUS: 1 - Symptomatic but completely ambulatory  Filed Vitals:   01/05/16 1113  BP: 154/81  Pulse: 66  Temp: 97.8 F (36.6 C)  Resp: 18    GENERAL:alert, no distress, well nourished, well developed, comfortable, cooperative, smiling and accompanied by her son, Jeneen Rinks. SKIN: skin color, texture, turgor are normal, no rashes or significant lesions HEAD: Normocephalic, No masses, lesions, tenderness or abnormalities EYES: normal, PERRLA, EOMI, Conjunctiva are pink and non-injected EARS: External ears normal OROPHARYNX:lips, buccal mucosa, and tongue normal and mucous membranes are moist  NECK: supple, trachea midline LYMPH:  NO palpable adenopathy in the neck, axillae  BREAST:not examined LUNGS: CTA B/L HEART: RRR without murmur, rub or gallop. ABDOMEN:abdomen soft and non-tender, no rebound or guarding. Well healed surgical incision site.  BACK: Back symmetric, no  curvature. EXTREMITIES:less then 2 second capillary refill, no joint deformities, effusion, or inflammation, no skin discoloration, positive findings:   NEURO: alert & oriented x 3 with fluent speech, no focal motor/sensory deficits, gait normal   LABORATORY DATA: I have reviewed the data as listed CBC    Component Value Date/Time   WBC 4.2 01/05/2016 1023   RBC 2.81* 01/05/2016 1023   RBC 2.53* 08/26/2015 0905   HGB 10.2* 01/05/2016 1023   HCT 29.8* 01/05/2016 1023   PLT 230 01/05/2016 1023   MCV 106.0* 01/05/2016 1023   MCH 36.3* 01/05/2016 1023   MCHC 34.2 01/05/2016 1023   RDW 12.4 01/05/2016 1023   LYMPHSABS  1.0 01/05/2016 1023   MONOABS 0.5 01/05/2016 1023   EOSABS 0.2 01/05/2016 1023   BASOSABS 0.0 01/05/2016 1023      Chemistry      Component Value Date/Time   NA 144 01/05/2016 1023   K 3.6 01/05/2016 1023   CL 105 01/05/2016 1023   CO2 31 01/05/2016 1023   BUN 13 01/05/2016 1023   CREATININE 1.18* 01/05/2016 1023      Component Value Date/Time   CALCIUM 9.1 01/05/2016 1023   ALKPHOS 62 01/05/2016 1023   AST 21 01/05/2016 1023   ALT 22 01/05/2016 1023   BILITOT 0.5 01/05/2016 1023     Lab Results  Component Value Date   CA125 8.0 11/04/2015    Lab Results  Component Value Date   CEA 0.5 04/26/2015     PATHOLOGY:     ASSESSMENT AND PLAN:  Stage IVB Ovarian Carcinoma Malignant Ascites Anemia Leukocytosis Stool cards negative X 3   She has done very well with therapy. She achieved an R0 resection at the time of surgery. Note that CT imaging of the chest at diagnosis in August suggested metastatic disease in the chest. She has completed all recommended therapy. Currently she is without obvious recurrence, we will call her with her CA-125.  Anemia is better. She is macrocytic with last B12 and folate (05/2015 WNL) will recheck these. We did not pursue a BMBX.   Her last blood transfusion was on 10/16/15.  I have tentatively scheduled her for  a follow-up in 3 months, sooner if needed.   Orders Placed This Encounter  Procedures  . CBC with Differential    Standing Status: Future     Number of Occurrences:      Standing Expiration Date: 01/04/2017  . Comprehensive metabolic panel    Standing Status: Future     Number of Occurrences:      Standing Expiration Date: 01/04/2017  . CA 125    Standing Status: Future     Number of Occurrences:      Standing Expiration Date: 01/04/2017   All questions were answered. The patient knows to call the clinic with any problems, questions or concerns. We can certainly see the patient much sooner if necessary.  This document serves as a record of services personally performed by Ancil Linsey, MD. It was created on her behalf by Toni Amend, a trained medical scribe. The creation of this record is based on the scribe's personal observations and the provider's statements to them. This document has been checked and approved by the attending provider.  I have reviewed the above documentation for accuracy and completeness, and I agree with the above.  This note is electronically signed [id number] Cyril Mourning, MD  4:26 PM

## 2016-01-06 LAB — CA 125: CA 125: 6.5 U/mL (ref 0.0–38.1)

## 2016-01-12 ENCOUNTER — Encounter (HOSPITAL_COMMUNITY): Payer: Medicare Other

## 2016-01-12 DIAGNOSIS — D630 Anemia in neoplastic disease: Secondary | ICD-10-CM | POA: Diagnosis not present

## 2016-01-12 DIAGNOSIS — C569 Malignant neoplasm of unspecified ovary: Secondary | ICD-10-CM

## 2016-01-12 DIAGNOSIS — D7589 Other specified diseases of blood and blood-forming organs: Secondary | ICD-10-CM

## 2016-01-12 LAB — FOLATE: FOLATE: 48.9 ng/mL (ref >5.9)

## 2016-01-12 LAB — VITAMIN B12: VITAMIN B 12: 827 pg/mL (ref 180–914)

## 2016-01-26 ENCOUNTER — Encounter (HOSPITAL_COMMUNITY): Payer: Medicare Other | Attending: Hematology & Oncology

## 2016-01-26 VITALS — BP 142/80 | HR 78 | Temp 98.0°F | Resp 18

## 2016-01-26 DIAGNOSIS — C562 Malignant neoplasm of left ovary: Secondary | ICD-10-CM | POA: Diagnosis not present

## 2016-01-26 DIAGNOSIS — Z452 Encounter for adjustment and management of vascular access device: Secondary | ICD-10-CM | POA: Diagnosis not present

## 2016-01-26 DIAGNOSIS — C569 Malignant neoplasm of unspecified ovary: Secondary | ICD-10-CM

## 2016-01-26 MED ORDER — HEPARIN SOD (PORK) LOCK FLUSH 100 UNIT/ML IV SOLN
500.0000 [IU] | Freq: Once | INTRAVENOUS | Status: AC
  Administered 2016-01-26: 500 [IU] via INTRAVENOUS
  Filled 2016-01-26: qty 5

## 2016-01-26 MED ORDER — SODIUM CHLORIDE 0.9% FLUSH
20.0000 mL | INTRAVENOUS | Status: DC | PRN
  Administered 2016-01-26: 20 mL via INTRAVENOUS
  Filled 2016-01-26: qty 20

## 2016-01-26 NOTE — Patient Instructions (Signed)
[place] at [facility] Discharge Instructions  RECOMMENDATIONS MADE BY THE CONSULTANT AND ANY TEST RESULTS WILL BE SENT TO YOUR REFERRING PHYSICIAN.  Port flush today.    Thank you for choosing [place] at [facility] to provide your oncology and hematology care.  To afford each patient quality time with our provider, please arrive at least 15 minutes before your scheduled appointment time.   Beginning January 23rd 2017 lab work for the [facility] will be done in the  Main lab at [facility] on 1st floor. If you have a lab appointment with the [facility] please come in thru the  Main Entrance and check in at the main information desk  You need to re-schedule your appointment should you arrive 10 or more minutes late.  We strive to give you quality time with our providers, and arriving late affects you and other patients whose appointments are after yours.  Also, if you no show three or more times for appointments you may be dismissed from the clinic at the providers discretion.     Again, thank you for choosing [facility].  Our hope is that these requests will decrease the amount of time that you wait before being seen by our physicians.       _____________________________________________________________  Should you have questions after your visit to [facility], please contact our office at [phone] between the hours of 8:30 a.m. and 4:30 p.m.  Voicemails left after 4:30 p.m. will not be returned until the following business day.  For prescription refill requests, have your pharmacy contact our office.         Resources For Cancer Patients and their Caregivers ? American Cancer Society: Can assist with transportation, wigs, general needs, runs Look Good Feel Better.        (212)434-3770 ? Cancer Care: Provides financial assistance, online support groups, medication/co-pay assistance.   1-800-813-HOPE (254) 170-9621) ? [facility] Assists [place] cancer patients and their families through emotional , educational and financial support.  707-417-2382 ? [place] DSS Where to apply for food stamps, Medicaid and utility assistance. 914-211-3690 ? RCATS: Transportation to medical appointments. (605) 100-3473 ? Social Security Administration: May apply for disability if have a Stage IV cancer. 423 053 1409 (289)764-3709 ? [employer], Disability and Transit Services: Assists with nutrition, care and transit needs. [place] Support Programs: @10RELATIVEDAYS @ > Cancer Support Group  2nd Tuesday of the month 1pm-2pm, Journey Room  > Creative Journey  3rd Tuesday of the month 1130am-1pm, Journey Room  > Look Good Feel Better  1st Wednesday of the month 10am-12 noon, Journey Room (Call [facility] to register [phone])

## 2016-01-26 NOTE — Progress Notes (Signed)
Latasha [name] presented for Portacath access and flush. Proper placement of portacath confirmed by CXR. Portacath located right chest wall accessed with  H 20 needle. Good blood return present. Portacath flushed with 32m NS and 500U/546mHeparin and needle removed intact. Procedure without incident. Patient tolerated procedure well.

## 2016-03-24 ENCOUNTER — Encounter (HOSPITAL_COMMUNITY): Payer: Medicare Other | Attending: Hematology & Oncology

## 2016-03-24 DIAGNOSIS — D7589 Other specified diseases of blood and blood-forming organs: Secondary | ICD-10-CM | POA: Insufficient documentation

## 2016-03-24 DIAGNOSIS — Z452 Encounter for adjustment and management of vascular access device: Secondary | ICD-10-CM

## 2016-03-24 DIAGNOSIS — C562 Malignant neoplasm of left ovary: Secondary | ICD-10-CM | POA: Diagnosis not present

## 2016-03-24 DIAGNOSIS — D630 Anemia in neoplastic disease: Secondary | ICD-10-CM | POA: Insufficient documentation

## 2016-03-24 DIAGNOSIS — C569 Malignant neoplasm of unspecified ovary: Secondary | ICD-10-CM | POA: Insufficient documentation

## 2016-03-24 MED ORDER — HEPARIN SOD (PORK) LOCK FLUSH 100 UNIT/ML IV SOLN
500.0000 [IU] | Freq: Once | INTRAVENOUS | Status: AC
  Administered 2016-03-24: 500 [IU] via INTRAVENOUS
  Filled 2016-03-24: qty 5

## 2016-03-24 MED ORDER — SODIUM CHLORIDE 0.9% FLUSH
10.0000 mL | Freq: Once | INTRAVENOUS | Status: AC
  Administered 2016-03-24: 10 mL via INTRAVENOUS

## 2016-03-24 NOTE — Progress Notes (Signed)
Latasha [name] presented for Portacath access and flush. Proper placement of portacath confirmed by CXR. Portacath located right chest wall accessed with  H 20 needle. Good blood return present. Portacath flushed with 47m NS and 500U/55mHeparin and needle removed intact. Procedure without incident. Patient tolerated procedure well.

## 2016-04-04 ENCOUNTER — Encounter (HOSPITAL_COMMUNITY): Payer: Medicare Other

## 2016-04-04 ENCOUNTER — Encounter (HOSPITAL_COMMUNITY): Payer: Medicare Other | Attending: Hematology & Oncology | Admitting: Hematology & Oncology

## 2016-04-04 ENCOUNTER — Encounter (HOSPITAL_COMMUNITY): Payer: Self-pay | Admitting: Hematology & Oncology

## 2016-04-04 VITALS — BP 141/65 | HR 62 | Temp 98.0°F | Resp 18 | Wt 135.8 lb

## 2016-04-04 DIAGNOSIS — D649 Anemia, unspecified: Secondary | ICD-10-CM | POA: Diagnosis not present

## 2016-04-04 DIAGNOSIS — Z8541 Personal history of malignant neoplasm of cervix uteri: Secondary | ICD-10-CM | POA: Diagnosis present

## 2016-04-04 DIAGNOSIS — D630 Anemia in neoplastic disease: Secondary | ICD-10-CM | POA: Diagnosis present

## 2016-04-04 DIAGNOSIS — D7589 Other specified diseases of blood and blood-forming organs: Secondary | ICD-10-CM

## 2016-04-04 DIAGNOSIS — C569 Malignant neoplasm of unspecified ovary: Secondary | ICD-10-CM

## 2016-04-04 DIAGNOSIS — R180 Malignant ascites: Secondary | ICD-10-CM | POA: Diagnosis not present

## 2016-04-04 DIAGNOSIS — D72829 Elevated white blood cell count, unspecified: Secondary | ICD-10-CM

## 2016-04-04 LAB — COMPREHENSIVE METABOLIC PANEL
ALBUMIN: 4.4 g/dL (ref 3.5–5.0)
ALK PHOS: 80 U/L (ref 38–126)
ALT: 29 U/L (ref 14–54)
ANION GAP: 4 — AB (ref 5–15)
AST: 27 U/L (ref 15–41)
BUN: 16 mg/dL (ref 6–20)
CALCIUM: 9 mg/dL (ref 8.9–10.3)
CHLORIDE: 104 mmol/L (ref 101–111)
CO2: 30 mmol/L (ref 22–32)
Creatinine, Ser: 0.97 mg/dL (ref 0.44–1.00)
GFR calc non Af Amer: 57 mL/min — ABNORMAL LOW (ref >60)
GLUCOSE: 97 mg/dL (ref 65–99)
POTASSIUM: 4 mmol/L (ref 3.5–5.1)
SODIUM: 138 mmol/L (ref 135–145)
Total Bilirubin: 0.5 mg/dL (ref 0.3–1.2)
Total Protein: 7 g/dL (ref 6.5–8.1)

## 2016-04-04 LAB — CBC WITH DIFFERENTIAL/PLATELET
BASOS PCT: 1 %
Basophils Absolute: 0.1 10*3/uL (ref 0.0–0.1)
EOS ABS: 0.3 10*3/uL (ref 0.0–0.7)
EOS PCT: 8 %
HCT: 31.8 % — ABNORMAL LOW (ref 36.0–46.0)
Hemoglobin: 10.6 g/dL — ABNORMAL LOW (ref 12.0–15.0)
LYMPHS ABS: 1.2 10*3/uL (ref 0.7–4.0)
Lymphocytes Relative: 30 %
MCH: 35.6 pg — AB (ref 26.0–34.0)
MCHC: 33.3 g/dL (ref 30.0–36.0)
MCV: 106.7 fL — ABNORMAL HIGH (ref 78.0–100.0)
MONO ABS: 0.6 10*3/uL (ref 0.1–1.0)
MONOS PCT: 14 %
NEUTROS PCT: 47 %
Neutro Abs: 1.9 10*3/uL (ref 1.7–7.7)
PLATELETS: 210 10*3/uL (ref 150–400)
RBC: 2.98 MIL/uL — ABNORMAL LOW (ref 3.87–5.11)
RDW: 12.6 % (ref 11.5–15.5)
WBC: 4.1 10*3/uL (ref 4.0–10.5)

## 2016-04-04 MED ORDER — TRAMADOL HCL 50 MG PO TABS
50.0000 mg | ORAL_TABLET | Freq: Four times a day (QID) | ORAL | Status: DC | PRN
Start: 2016-04-04 — End: 2017-01-06

## 2016-04-04 MED ORDER — METOPROLOL TARTRATE 25 MG PO TABS: 25.0000 mg | ORAL_TABLET | Freq: Two times a day (BID) | ORAL | Status: DC

## 2016-04-04 NOTE — Patient Instructions (Signed)
[place] at [facility] Discharge Instructions  RECOMMENDATIONS MADE BY THE CONSULTANT AND ANY TEST RESULTS WILL BE SENT TO YOUR REFERRING PHYSICIAN.  Return to clinic in 3 months with labs. Please call the clinic with any concerns.  Thank you for choosing [place] at [facility] to provide your oncology and hematology care.  To afford each patient quality time with our provider, please arrive at least 15 minutes before your scheduled appointment time.   Beginning January 23rd 2017 lab work for the [facility] will be done in the  Main lab at [facility] on 1st floor. If you have a lab appointment with the [facility] please come in thru the  Main Entrance and check in at the main information desk  You need to re-schedule your appointment should you arrive 10 or more minutes late.  We strive to give you quality time with our providers, and arriving late affects you and other patients whose appointments are after yours.  Also, if you no show three or more times for appointments you may be dismissed from the clinic at the providers discretion.     Again, thank you for choosing [facility].  Our hope is that these requests will decrease the amount of time that you wait before being seen by our physicians.       _____________________________________________________________  Should you have questions after your visit to [facility], please contact our office at [phone] between the hours of 8:30 a.m. and 4:30 p.m.  Voicemails left after 4:30 p.m. will not be returned until the following business day.  For prescription refill requests, have your pharmacy contact our office.         Resources For Cancer Patients and their Caregivers ? American Cancer Society: Can assist with transportation, wigs, general needs, runs Look Good Feel Better.        641-675-0366 ? Cancer Care: Provides financial assistance,  online support groups, medication/co-pay assistance.  1-800-813-HOPE 928-088-2728) ? [facility] Assists [place] cancer patients and their families through emotional , educational and financial support.  626-252-0470 ? [place] DSS Where to apply for food stamps, Medicaid and utility assistance. 803-096-3050 ? RCATS: Transportation to medical appointments. 6065601104 ? Social Security Administration: May apply for disability if have a Stage IV cancer. (903)093-5538 585-062-2005 ? [employer], Disability and Transit Services: Assists with nutrition, care and transit needs. [place] Support Programs: @10RELATIVEDAYS @ > Cancer Support Group  2nd Tuesday of the month 1pm-2pm, Journey Room  > Creative Journey  3rd Tuesday of the month 1130am-1pm, Journey Room  > Look Good Feel Better  1st Wednesday of the month 10am-12 noon, Journey Room (Call [facility] to register [phone])

## 2016-04-04 NOTE — Progress Notes (Signed)
[place] at [place] NOTE  No PCP Per Patient No address on file    Ovarian cancer [facility])   04/25/2015 - 04/28/2015 Hospital Admission Nausea/diarrhea.  Oncology consult completed on 04/27/2015.   04/25/2015 Tumor Marker CA 125- 3902.      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   04/27/2015 Procedure US Paracentesis- A total of approximately 3700 mL of amber colored fluid was removed. A fluid sample was sent for laboratory analysis.   04/27/2015 Imaging CT Chest- Mild left supraclavicular lymphadenopathy and moderate mediastinal lymphadenopathy involving the prevascular, subcarinal and bilateral pericardiophrenic nodal chains, likely metastatic.   04/27/2015 Pathology Results Diagnosis PERITONEAL/ASCITIC FLUID(SPECIMEN 1 OF 1 COLLECTED 04/27/15): MALIGNANT CELLS CONSISTENT WITH METASTATIC ADENOCARCINOMA.  The immunophenotype is most consistent with a gynecologic primary, most likely ovary.   05/08/2015 Miscellaneous Seen by Dr. Denman George- recommending Carboplatin/Paclitaxel x 3 cycles and return visit to see her (within 1 week of administration of third cycle) to evaluate for optimal sequencing of treatment modalities.   05/13/2015 - 05/15/2015 Hospital Admission Hospitalized for AKI   05/19/2015 - 07/01/2015 Chemotherapy Carboplatin/Paclitaxel x 3 cycles   07/28/2015 Surgery Exploratory laparotomy with total abdominal hysterectomy, bilateral salpingo-oophorectomy, omentectomy radical tumor debulking for ovarian cancer; by Dr. Everitt Amber.   07/28/2015 Pathology Results Uterus +/- tubes/ovaries, neoplastic, cervix - HIGH GRADE SEROUS CARCINOMA INVOLVING LEFT OVARY, LEFT FALLOPIAN TUBE AND RIGHT FALLOPIAN TUBE. - CERVIX, ENDOMETRIUM AND MYOMETRIUM ARE FREE OF TUMOR. 2. Cul-de-sac biop...   08/26/2015 -  Chemotherapy  Carboplatin/Paclitaxel x 3 cycles   12/23/2015 Miscellaneous Genetic counseling.  Genetic testing was normal, and did not reveal a deleterious mutation in these genes    CURRENT THERAPY: Observation  INTERVAL HISTORY: Latasha [name] 75 y.o. 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  Latasha [name] is accompanied by her son. I personally reviewed and went over laboratory studies with the patient.   She is able to cook for herself and has been eating better. When asked if she is staying active, she mentions how hot the weather has been lately. She has regular bowel movements at least once daily. She denies any nausea or vomiting. She denies numbness or tingling in her hands and feet. States that she tries not to worry. Her hair has been coming back slowly. She wears a hat when she leaves the house.   She complains of intermittent left knee pain.  She was curious if she should go to her scheduled mammogram next month in [place]. I have encouraged her to go to this appointment.   No abdominal bloating, no nausea.    Past Medical History  Diagnosis Date  . Hypertension   . Schizophrenia ([place])   . 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 Ulcerative colitis ([place])   . Mixed hyperlipidemia   . Vitamin D deficiency   . B12 deficiency   . Iron deficiency anemia   . Ovarian cancer ([place])   . Cataract   . Arthritis   . History of blood transfusion   . History of chemotherapy     has Renal failure; Nausea without vomiting; Ascites, malignant; Ovarian cancer ([place]); Omental mass; Acute kidney injury ([place]); UTI (lower urinary tract infection); Normocytic anemia; IBD (inflammatory bowel disease);  Schizophrenia, unspecified type ([place]); Diarrhea; Malnutrition of moderate degree ([place]); Acute renal failure ([place]); Ileus, postoperative; and Genetic testing on her problem  list.     has No Known Allergies.  Current Outpatient Prescriptions on File Prior to Visit  Medication Sig Dispense Refill  . acetaminophen (TYLENOL) 500 MG tablet Take 2 tablets (1,000 mg total) by mouth every 12 (twelve) hours. 30 tablet 0  . amLODipine (NORVASC) 10 MG tablet Take 10 mg by mouth every evening.     Marland Kitchen aspirin EC 81 MG tablet Take 81 mg by mouth daily.    . Cholecalciferol (VITAMIN D-3) 1000 UNITS CAPS Take 1,000 Units by mouth daily.     Marland Kitchen docusate sodium (COLACE) 100 MG capsule Take 100 mg by mouth 2 (two) times daily.    . ferrous sulfate 325 (65 FE) MG EC tablet Take 325 mg by mouth 2 (two) times daily.    . folic acid (FOLVITE) 1 MG tablet Take 1 mg by mouth daily.    Marland Kitchen lidocaine-prilocaine (EMLA) cream Apply a quarter size amount to port site 1 hour prior to chemo. Do not rub in. Cover with plastic wrap. 30 g 3  . lovastatin (MEVACOR) 10 MG tablet Take 10 mg by mouth at bedtime.    . megestrol (MEGACE) 400 MG/10ML suspension Take 10 mLs (400 mg total) by mouth daily. 240 mL 2  . metoprolol tartrate (LOPRESSOR) 25 MG tablet Take 1 tablet (25 mg total) by mouth 2 (two) times daily. 60 tablet 6  . mirtazapine (REMERON) 15 MG tablet Take 7.5 mg by mouth at bedtime.    . Multiple Vitamin (MULTIVITAMIN WITH MINERALS) TABS tablet Take 1 tablet by mouth daily.    . ondansetron (ZOFRAN) 4 MG tablet Take 1 tablet (4 mg total) by mouth every 6 (six) hours as needed for nausea. 20 tablet 0  . ondansetron (ZOFRAN) 8 MG tablet Take 1 tablet (8 mg total) by mouth every 8 (eight) hours as needed for nausea or vomiting. 30 tablet 2  . oxyCODONE-acetaminophen (PERCOCET/ROXICET) 5-325 MG tablet Take 1 tablet by mouth every 4 (four) hours as needed for severe pain. 60 tablet 0  . prochlorperazine (COMPAZINE) 10 MG tablet Take 1 tablet (10 mg total) by mouth every 6 (six) hours as needed (Nausea or vomiting). 30 tablet 1  . QUEtiapine (SEROQUEL XR) 300 MG 24 hr tablet Take 300 mg by mouth at  bedtime.    . sulfaSALAzine (AZULFIDINE) 500 MG tablet Take 500 mg by mouth 3 (three) times daily.    . traMADol (ULTRAM) 50 MG tablet Take 1 tablet (50 mg total) by mouth every 6 (six) hours as needed. 30 tablet 1  . vitamin B-12 (CYANOCOBALAMIN) 1000 MCG tablet Take 1,000 mcg by mouth daily.     No current facility-administered medications on file prior to visit.    Past Surgical History  Procedure Laterality Date  . Replacement total knee      right knee in 2003  . Paracentesis  04/27/15  . Portacath placement Right 04/2015  . Laparotomy N/A 07/28/2015    Procedure: EXPLORATORY LAPAROTOMY;  Surgeon: Everitt Amber, MD;  Location: [facility];  Service: Gynecology;  Laterality: N/A;  . Abdominal hysterectomy N/A 07/28/2015    Procedure: TOTAL HYSTERECTOMY ABDOMINAL BILATERAL SALPINGO OOPHRORECTOMY;  Surgeon: Everitt Amber, MD;  Location: [facility];  Service: Gynecology;  Laterality: N/A;  . Omentectomy N/A 07/28/2015    Procedure: OMENTECTOMY ;  Surgeon: Everitt Amber, MD;  Location: [facility];  Service: Gynecology;  Laterality: N/A;  . Debulking N/A 07/28/2015    Procedure: DEBULKING;  Surgeon: Everitt Amber, MD;  Location: [facility];  Service: Gynecology;  Laterality: N/A;    Denies any headaches, dizziness, double vision, fevers, chills, night sweats, nausea, vomiting, diarrhea, constipation, chest pain, heart palpitations, shortness of breath, blood in stool, black tarry stool, urinary pain, urinary burning, urinary frequency, hematuria. Positive for joint pain. Intermittent left knee pain 14 point review of systems was performed and is negative except as detailed under history of present illness and above   PHYSICAL EXAMINATION  ECOG PERFORMANCE STATUS: 1 - Symptomatic but completely ambulatory  Filed Vitals:   04/04/16 1200  BP: 141/65  Pulse: 62  Temp: 98 F (36.7 C)  Resp: 18    GENERAL:alert, no distress, well nourished, well developed, comfortable, cooperative, smiling and accompanied by her  son, Jeneen Rinks. SKIN: skin color, texture, turgor are normal, no rashes or significant lesions HEAD: Normocephalic, No masses, lesions, tenderness or abnormalities EYES: normal, PERRLA, EOMI, Conjunctiva are pink and non-injected EARS: External ears normal OROPHARYNX:lips, buccal mucosa, and tongue normal and mucous membranes are moist  NECK: supple, trachea midline LYMPH:  NO palpable adenopathy in the neck, axillae  BREAST:not examined LUNGS: CTA B/L HEART: RRR without murmur, rub or gallop. ABDOMEN:abdomen soft and non-tender, no rebound or guarding. Well healed surgical incision site.  BACK: Back symmetric, no curvature. EXTREMITIES:less then 2 second capillary refill, no joint deformities, effusion, or inflammation, no skin discoloration, positive findings:   NEURO: alert & oriented x 3 with fluent speech, no focal motor/sensory deficits, gait normal   LABORATORY DATA: I have reviewed the data as listed CBC    Component Value Date/Time   WBC 4.1 04/04/2016 1038   RBC 2.98* 04/04/2016 1038   RBC 2.53* 08/26/2015 0905   HGB 10.6* 04/04/2016 1038   HCT 31.8* 04/04/2016 1038   PLT 210 04/04/2016 1038   MCV 106.7* 04/04/2016 1038   MCH 35.6* 04/04/2016 1038   MCHC 33.3 04/04/2016 1038   RDW 12.6 04/04/2016 1038   LYMPHSABS 1.2 04/04/2016 1038   MONOABS 0.6 04/04/2016 1038   EOSABS 0.3 04/04/2016 1038   BASOSABS 0.1 04/04/2016 1038      Chemistry      Component Value Date/Time   NA 138 04/04/2016 1038   K 4.0 04/04/2016 1038   CL 104 04/04/2016 1038   CO2 30 04/04/2016 1038   BUN 16 04/04/2016 1038   CREATININE 0.97 04/04/2016 1038      Component Value Date/Time   CALCIUM 9.0 04/04/2016 1038   ALKPHOS 80 04/04/2016 1038   AST 27 04/04/2016 1038   ALT 29 04/04/2016 1038   BILITOT 0.5 04/04/2016 1038     Lab Results  Component Value Date   CA125 6.5 01/05/2016    Lab Results  Component Value Date   CEA 0.5 04/26/2015     PATHOLOGY:     ASSESSMENT AND  PLAN:  Stage IVB Ovarian Carcinoma Malignant Ascites Anemia Leukocytosis Stool cards negative X 3  She has done very well with therapy. She achieved an R0 resection at the time of surgery. Note that CT imaging of the chest at diagnosis in August 2016 suggested metastatic disease in the chest. She has completed all recommended therapy. Currently she is without obvious recurrence, we will call her with her CA-125.  Anemia is better. B12 and folate in April were excellent. We did not pursue a BMBX. ? Low grade MDS.  Her last  blood transfusion was on 10/16/15.  I have tentatively scheduled her for a follow-up in 3 months, sooner if needed.   I have refilled her metoprolol. I have refilled her tramadol, she uses this principally for her left knee pain which keeps her active.   Orders Placed This Encounter  Procedures  . CBC with Differential    Standing Status: Future     Number of Occurrences:      Standing Expiration Date: 07/05/2017  . Comprehensive metabolic panel    Standing Status: Future     Number of Occurrences:      Standing Expiration Date: 07/05/2017  . CA 125    Standing Status: Future     Number of Occurrences:      Standing Expiration Date: 07/05/2017   All questions were answered. The patient knows to call the clinic with any problems, questions or concerns. We can certainly see the patient much sooner if necessary.  This document serves as a record of services personally performed by Ancil Linsey, MD. It was created on her behalf by Arlyce Harman, a trained medical scribe. The creation of this record is based on the scribe's personal observations and the provider's statements to them. This document has been checked and approved by the attending provider.  I have reviewed the above documentation for accuracy and completeness, and I agree with the above.  This note is electronically signed by: Molli Hazard, MD  12:16 PM

## 2016-04-05 ENCOUNTER — Encounter (HOSPITAL_COMMUNITY): Payer: Self-pay | Admitting: Hematology & Oncology

## 2016-04-05 ENCOUNTER — Other Ambulatory Visit (HOSPITAL_COMMUNITY): Payer: Medicare Other

## 2016-04-05 ENCOUNTER — Ambulatory Visit (HOSPITAL_COMMUNITY): Payer: Medicare Other | Admitting: Hematology & Oncology

## 2016-04-05 LAB — CA 125: CA 125: 10.5 U/mL (ref 0.0–38.1)

## 2016-05-19 ENCOUNTER — Encounter (HOSPITAL_COMMUNITY): Payer: Medicare Other | Attending: Hematology & Oncology

## 2016-05-19 VITALS — BP 145/64 | HR 66 | Temp 98.2°F | Resp 18

## 2016-05-19 DIAGNOSIS — D7589 Other specified diseases of blood and blood-forming organs: Secondary | ICD-10-CM | POA: Insufficient documentation

## 2016-05-19 DIAGNOSIS — Z95828 Presence of other vascular implants and grafts: Secondary | ICD-10-CM

## 2016-05-19 DIAGNOSIS — Z8541 Personal history of malignant neoplasm of cervix uteri: Secondary | ICD-10-CM

## 2016-05-19 DIAGNOSIS — Z452 Encounter for adjustment and management of vascular access device: Secondary | ICD-10-CM | POA: Diagnosis not present

## 2016-05-19 DIAGNOSIS — D630 Anemia in neoplastic disease: Secondary | ICD-10-CM | POA: Insufficient documentation

## 2016-05-19 DIAGNOSIS — C562 Malignant neoplasm of left ovary: Secondary | ICD-10-CM

## 2016-05-19 DIAGNOSIS — C569 Malignant neoplasm of unspecified ovary: Secondary | ICD-10-CM | POA: Insufficient documentation

## 2016-05-19 MED ORDER — HEPARIN SOD (PORK) LOCK FLUSH 100 UNIT/ML IV SOLN
500.0000 [IU] | Freq: Once | INTRAVENOUS | Status: AC
  Administered 2016-05-19: 500 [IU] via INTRAVENOUS
  Filled 2016-05-19: qty 5

## 2016-05-19 MED ORDER — SODIUM CHLORIDE 0.9% FLUSH
10.0000 mL | INTRAVENOUS | Status: DC | PRN
  Administered 2016-05-19: 10 mL via INTRAVENOUS
  Filled 2016-05-19: qty 10

## 2016-05-19 NOTE — Patient Instructions (Signed)
[place] at [facility] Discharge Instructions  RECOMMENDATIONS MADE BY THE CONSULTANT AND ANY TEST RESULTS WILL BE SENT TO YOUR REFERRING PHYSICIAN.  Port flushed per protocol today. Follow-up as scheduled. Call clinic for any questions or concerns  Thank you for choosing [place] at [facility] to provide your oncology and hematology care.  To afford each patient quality time with our provider, please arrive at least 15 minutes before your scheduled appointment time.   Beginning January 23rd 2017 lab work for the [facility] will be done in the  Main lab at [facility] on 1st floor. If you have a lab appointment with the [facility] please come in thru the  Main Entrance and check in at the main information desk  You need to re-schedule your appointment should you arrive 10 or more minutes late.  We strive to give you quality time with our providers, and arriving late affects you and other patients whose appointments are after yours.  Also, if you no show three or more times for appointments you may be dismissed from the clinic at the providers discretion.     Again, thank you for choosing [facility].  Our hope is that these requests will decrease the amount of time that you wait before being seen by our physicians.       _____________________________________________________________  Should you have questions after your visit to [facility], please contact our office at [phone] between the hours of 8:30 a.m. and 4:30 p.m.  Voicemails left after 4:30 p.m. will not be returned until the following business day.  For prescription refill requests, have your pharmacy contact our office.         Resources For Cancer Patients and their Caregivers ? American Cancer Society: Can assist with transportation, wigs, general needs, runs Look Good Feel Better.        726-637-4978 ? Cancer Care: Provides financial  assistance, online support groups, medication/co-pay assistance.  1-800-813-HOPE (817)356-2344) ? [place] Assists [place] cancer patients and their families through emotional , educational and financial support.  318-463-6474 ? [place] DSS Where to apply for food stamps, Medicaid and utility assistance. 225-037-2051 ? [facility]: Transportation to medical appointments. (207)796-8105 ? Social Security Administration: May apply for disability if have a Stage IV cancer. 512-095-5695 925-036-6476 ? [employer], Disability and Transit Services: Assists with nutrition, care and transit needs. [place] Support Programs: @10RELATIVEDAYS @ > Cancer Support Group  2nd Tuesday of the month 1pm-2pm, Journey Room  > Creative Journey  3rd Tuesday of the month 1130am-1pm, Journey Room  > Look Good Feel Better  1st Wednesday of the month 10am-12 noon, Journey Room (Call [place] to register [phone])

## 2016-05-19 NOTE — Progress Notes (Signed)
Larina Bras presented for Portacath access and flush.  Proper placement of portacath confirmed by CXR.  Portacath located right chest wall accessed with  H 20 needle.  Good blood return present. Portacath flushed with 12m NS and 500U/575mHeparin and needle removed intact.  Procedure tolerated well and without incident.  Pt discharged self ambulatory in satisfactory condition

## 2016-07-05 ENCOUNTER — Encounter (HOSPITAL_COMMUNITY): Payer: Medicare Other | Attending: Hematology & Oncology | Admitting: Hematology & Oncology

## 2016-07-05 ENCOUNTER — Encounter (HOSPITAL_COMMUNITY): Payer: Self-pay | Admitting: Hematology & Oncology

## 2016-07-05 ENCOUNTER — Encounter (HOSPITAL_COMMUNITY): Payer: Medicare Other

## 2016-07-05 ENCOUNTER — Other Ambulatory Visit (HOSPITAL_COMMUNITY): Payer: Medicare Other

## 2016-07-05 VITALS — BP 128/71 | HR 71 | Temp 97.7°F | Resp 16 | Wt 144.1 lb

## 2016-07-05 DIAGNOSIS — C562 Malignant neoplasm of left ovary: Secondary | ICD-10-CM

## 2016-07-05 DIAGNOSIS — Z95828 Presence of other vascular implants and grafts: Secondary | ICD-10-CM

## 2016-07-05 DIAGNOSIS — C569 Malignant neoplasm of unspecified ovary: Secondary | ICD-10-CM | POA: Diagnosis not present

## 2016-07-05 DIAGNOSIS — Z23 Encounter for immunization: Secondary | ICD-10-CM | POA: Diagnosis not present

## 2016-07-05 DIAGNOSIS — D630 Anemia in neoplastic disease: Secondary | ICD-10-CM | POA: Diagnosis present

## 2016-07-05 DIAGNOSIS — D7589 Other specified diseases of blood and blood-forming organs: Secondary | ICD-10-CM

## 2016-07-05 DIAGNOSIS — Z0000 Encounter for general adult medical examination without abnormal findings: Secondary | ICD-10-CM

## 2016-07-05 LAB — COMPREHENSIVE METABOLIC PANEL
ALT: 25 U/L (ref 14–54)
ANION GAP: 7 (ref 5–15)
AST: 28 U/L (ref 15–41)
Albumin: 4.3 g/dL (ref 3.5–5.0)
Alkaline Phosphatase: 73 U/L (ref 38–126)
BUN: 13 mg/dL (ref 6–20)
CHLORIDE: 103 mmol/L (ref 101–111)
CO2: 28 mmol/L (ref 22–32)
Calcium: 9 mg/dL (ref 8.9–10.3)
Creatinine, Ser: 0.89 mg/dL (ref 0.44–1.00)
GFR calc non Af Amer: >60 mL/min (ref >60)
Glucose, Bld: 89 mg/dL (ref 65–99)
POTASSIUM: 3.9 mmol/L (ref 3.5–5.1)
SODIUM: 138 mmol/L (ref 135–145)
Total Bilirubin: 0.5 mg/dL (ref 0.3–1.2)
Total Protein: 7.2 g/dL (ref 6.5–8.1)

## 2016-07-05 LAB — CBC WITH DIFFERENTIAL/PLATELET
Basophils Absolute: 0 10*3/uL (ref 0.0–0.1)
Basophils Relative: 1 %
EOS ABS: 0.3 10*3/uL (ref 0.0–0.7)
EOS PCT: 6 %
HCT: 32.5 % — ABNORMAL LOW (ref 36.0–46.0)
Hemoglobin: 10.7 g/dL — ABNORMAL LOW (ref 12.0–15.0)
LYMPHS ABS: 1.2 10*3/uL (ref 0.7–4.0)
Lymphocytes Relative: 28 %
MCH: 34.5 pg — AB (ref 26.0–34.0)
MCHC: 32.9 g/dL (ref 30.0–36.0)
MCV: 104.8 fL — ABNORMAL HIGH (ref 78.0–100.0)
Monocytes Absolute: 0.4 10*3/uL (ref 0.1–1.0)
Monocytes Relative: 10 %
Neutro Abs: 2.4 10*3/uL (ref 1.7–7.7)
Neutrophils Relative %: 55 %
PLATELETS: 274 10*3/uL (ref 150–400)
RBC: 3.1 MIL/uL — AB (ref 3.87–5.11)
RDW: 12.8 % (ref 11.5–15.5)
WBC: 4.3 10*3/uL (ref 4.0–10.5)

## 2016-07-05 MED ORDER — INFLUENZA VAC SPLIT QUAD 0.5 ML IM SUSY
0.5000 mL | PREFILLED_SYRINGE | Freq: Once | INTRAMUSCULAR | Status: AC
  Administered 2016-07-05: 0.5 mL via INTRAMUSCULAR
  Filled 2016-07-05: qty 0.5

## 2016-07-05 MED ORDER — HEPARIN SOD (PORK) LOCK FLUSH 100 UNIT/ML IV SOLN
INTRAVENOUS | Status: AC
  Filled 2016-07-05: qty 5

## 2016-07-05 MED ORDER — HEPARIN SOD (PORK) LOCK FLUSH 100 UNIT/ML IV SOLN
500.0000 [IU] | Freq: Once | INTRAVENOUS | Status: AC
  Administered 2016-07-05: 500 [IU] via INTRAVENOUS

## 2016-07-05 MED ORDER — SODIUM CHLORIDE 0.9% FLUSH
10.0000 mL | INTRAVENOUS | Status: DC | PRN
  Administered 2016-07-05: 10 mL via INTRAVENOUS
  Filled 2016-07-05: qty 10

## 2016-07-05 MED ORDER — OXYCODONE-ACETAMINOPHEN 5-325 MG PO TABS: 1.0000 | ORAL_TABLET | ORAL | 0 refills | Status: DC | PRN

## 2016-07-05 NOTE — Progress Notes (Signed)
Latasha [name] presented for Portacath access and flush. Portacath located right chest wall accessed with  H 20 needle. Good blood return present. Portacath flushed with 61m NS and 500U/530mHeparin and needle removed intact. Procedure without incident. Patient tolerated procedure well.

## 2016-07-05 NOTE — Progress Notes (Signed)
[name] at [place] NOTE  No PCP Per Patient No address on file    Ovarian cancer [facility])   04/25/2015 - 04/28/2015 Hospital Admission    Nausea/diarrhea.  Oncology consult completed on 04/27/2015.      04/25/2015 Tumor Marker    CA 125- 3902.      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      04/27/2015 Procedure    US Paracentesis- A total of approximately 3700 mL of amber colored fluid was removed. A fluid sample was sent for laboratory analysis.      04/27/2015 Imaging    CT Chest- Mild left supraclavicular lymphadenopathy and moderate mediastinal lymphadenopathy involving the prevascular, subcarinal and bilateral pericardiophrenic nodal chains, likely metastatic.      04/27/2015 Pathology Results    Diagnosis PERITONEAL/ASCITIC FLUID(SPECIMEN 1 OF 1 COLLECTED 04/27/15): MALIGNANT CELLS CONSISTENT WITH METASTATIC ADENOCARCINOMA.  The immunophenotype is most consistent with a gynecologic primary, most likely ovary.      05/08/2015 Miscellaneous    Seen by Dr. Denman George- recommending Carboplatin/Paclitaxel x 3 cycles and return visit to see her (within 1 week of administration of third cycle) to evaluate for optimal sequencing of treatment modalities.      05/13/2015 - 05/15/2015 Hospital Admission    Hospitalized for AKI      05/19/2015 - 07/01/2015 Chemotherapy    Carboplatin/Paclitaxel x 3 cycles      07/28/2015 Surgery    Exploratory laparotomy with total abdominal hysterectomy, bilateral salpingo-oophorectomy, omentectomy radical tumor debulking for ovarian cancer; by Dr. Everitt Amber.      07/28/2015 Pathology Results    Uterus +/- tubes/ovaries, neoplastic, cervix - HIGH GRADE SEROUS CARCINOMA INVOLVING LEFT OVARY, LEFT FALLOPIAN TUBE AND RIGHT FALLOPIAN TUBE. - CERVIX, ENDOMETRIUM AND  MYOMETRIUM ARE FREE OF TUMOR. 2. Cul-de-sac biop...      08/26/2015 -  Chemotherapy    Carboplatin/Paclitaxel x 3 cycles      12/23/2015 Miscellaneous    Genetic counseling.  Genetic testing was normal, and did not reveal a deleterious mutation in these genes       CURRENT THERAPY: Observation  INTERVAL HISTORY: Latasha [name] 75 y.o. 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 She completed chemotherapy and surgical therapy.   Patient is accompanied by her son. 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  Latasha [name] denies poor appetite, abdominal pain, abdominal swelling, nausea, vomiting, bowel issues, difficulty sleeping, or neuropathy.   She has not had the flu shot this year and would like to receive it today.   Patient needs a refill for Percocet.   She notes that she is doing well. She does all of her ADL's without difficulty.  Her son also confirms this.    Past Medical History:  Diagnosis Date  . Arthritis   . B12 deficiency   . Cataract   . History of blood transfusion   . History of chemotherapy   . Hypertension   . Iron deficiency anemia   . Mixed hyperlipidemia   . Ovarian cancer ([place])   . 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 Schizophrenia ([place])   . Ulcerative colitis (  [place])   . Vitamin D deficiency     has Renal failure; Nausea without vomiting; Ascites, malignant; Ovarian cancer ([place]); Omental mass; Acute kidney injury ([place]); UTI (lower urinary tract infection); Normocytic anemia; IBD (inflammatory bowel disease); Schizophrenia, unspecified type ([place]); Diarrhea; Malnutrition of moderate degree ([place]); Acute renal failure ([place]); Ileus, postoperative ([place]); and Genetic testing on her problem list.     has No Known Allergies.  Current Outpatient Prescriptions on File  Prior to Visit  Medication Sig Dispense Refill  . acetaminophen (TYLENOL) 500 MG tablet Take 2 tablets (1,000 mg total) by mouth every 12 (twelve) hours. 30 tablet 0  . amLODipine (NORVASC) 10 MG tablet Take 10 mg by mouth every evening.     Marland Kitchen aspirin EC 81 MG tablet Take 81 mg by mouth daily.    . Cholecalciferol (VITAMIN D-3) 1000 UNITS CAPS Take 1,000 Units by mouth daily.     Marland Kitchen docusate sodium (COLACE) 100 MG capsule Take 100 mg by mouth 2 (two) times daily.    . ferrous sulfate 325 (65 FE) MG EC tablet Take 325 mg by mouth 2 (two) times daily.    . folic acid (FOLVITE) 1 MG tablet Take 1 mg by mouth daily.    Marland Kitchen lidocaine-prilocaine (EMLA) cream Apply a quarter size amount to port site 1 hour prior to chemo. Do not rub in. Cover with plastic wrap. 30 g 3  . lovastatin (MEVACOR) 10 MG tablet Take 10 mg by mouth at bedtime.    . megestrol (MEGACE) 400 MG/10ML suspension Take 10 mLs (400 mg total) by mouth daily. 240 mL 2  . metoprolol tartrate (LOPRESSOR) 25 MG tablet Take 1 tablet (25 mg total) by mouth 2 (two) times daily. 60 tablet 6  . mirtazapine (REMERON) 15 MG tablet Take 7.5 mg by mouth at bedtime.    . Multiple Vitamin (MULTIVITAMIN WITH MINERALS) TABS tablet Take 1 tablet by mouth daily.    . ondansetron (ZOFRAN) 4 MG tablet Take 1 tablet (4 mg total) by mouth every 6 (six) hours as needed for nausea. 20 tablet 0  . ondansetron (ZOFRAN) 8 MG tablet Take 1 tablet (8 mg total) by mouth every 8 (eight) hours as needed for nausea or vomiting. 30 tablet 2  . prochlorperazine (COMPAZINE) 10 MG tablet Take 1 tablet (10 mg total) by mouth every 6 (six) hours as needed (Nausea or vomiting). 30 tablet 1  . QUEtiapine (SEROQUEL XR) 300 MG 24 hr tablet Take 300 mg by mouth at bedtime.    . sulfaSALAzine (AZULFIDINE) 500 MG tablet Take 500 mg by mouth 3 (three) times daily.    . traMADol (ULTRAM) 50 MG tablet Take 1 tablet (50 mg total) by mouth every 6 (six) hours as needed. 90 tablet 1  .  vitamin B-12 (CYANOCOBALAMIN) 1000 MCG tablet Take 1,000 mcg by mouth daily.     No current facility-administered medications on file prior to visit.     Past Surgical History:  Procedure Laterality Date  . ABDOMINAL HYSTERECTOMY N/A 07/28/2015   Procedure: TOTAL HYSTERECTOMY ABDOMINAL BILATERAL SALPINGO OOPHRORECTOMY;  Surgeon: Everitt Amber, MD;  Location: [facility];  Service: Gynecology;  Laterality: N/A;  . DEBULKING N/A 07/28/2015   Procedure: DEBULKING;  Surgeon: Everitt Amber, MD;  Location: [facility];  Service: Gynecology;  Laterality: N/A;  . LAPAROTOMY N/A 07/28/2015   Procedure: EXPLORATORY LAPAROTOMY;  Surgeon: Everitt Amber, MD;  Location: [facility];  Service: Gynecology;  Laterality: N/A;  . OMENTECTOMY N/A 07/28/2015  Procedure: OMENTECTOMY ;  Surgeon: Everitt Amber, MD;  Location: [facility];  Service: Gynecology;  Laterality: N/A;  . PARACENTESIS  04/27/15  . PORTACATH PLACEMENT Right 04/2015  . REPLACEMENT TOTAL KNEE     right knee in 2003    Denies any headaches, dizziness, double vision, fevers, chills, night sweats, nausea, vomiting, diarrhea, constipation, chest pain, heart palpitations, shortness of breath, blood in stool, black tarry stool, urinary pain, urinary burning, urinary frequency, hematuria. Positive for joint pain. Intermittent left knee pain 14 point review of systems was performed and is negative except as detailed under history of present illness and above  PHYSICAL EXAMINATION  ECOG PERFORMANCE STATUS: 1 - Symptomatic but completely ambulatory  Vitals:   07/05/16 1000  BP: 128/71  Pulse: 71  Resp: 16  Temp: 97.7 F (36.5 C)    GENERAL:alert, no distress, well nourished, well developed, comfortable, cooperative, smiling and accompanied by her son, Latasha [name]. SKIN: skin color, texture, turgor are normal, no rashes or significant lesions HEAD: Normocephalic, No masses, lesions, tenderness or abnormalities EYES: normal, PERRLA, EOMI, Conjunctiva are pink and  non-injected EARS: External ears normal OROPHARYNX:lips, buccal mucosa, and tongue normal and mucous membranes are moist  NECK: supple, trachea midline LYMPH:  NO palpable adenopathy in the neck, axillae  BREAST:not examined LUNGS: CTA B/L HEART: RRR without murmur, rub or gallop. ABDOMEN:abdomen soft and non-tender, no rebound or guarding. Well healed surgical incision site.  BACK: Back symmetric, no curvature. EXTREMITIES:less then 2 second capillary refill, no joint deformities, effusion, or inflammation, no skin discoloration, positive findings:   NEURO: alert & oriented x 3 with fluent speech, no focal motor/sensory deficits, gait normal   LABORATORY DATA: I have reviewed the data as listed CBC    Component Value Date/Time   WBC 4.3 07/05/2016 1020   RBC 3.10 (L) 07/05/2016 1020   HGB 10.7 (L) 07/05/2016 1020   HCT 32.5 (L) 07/05/2016 1020   PLT 274 07/05/2016 1020   MCV 104.8 (H) 07/05/2016 1020   MCH 34.5 (H) 07/05/2016 1020   MCHC 32.9 07/05/2016 1020   RDW 12.8 07/05/2016 1020   LYMPHSABS 1.2 07/05/2016 1020   MONOABS 0.4 07/05/2016 1020   EOSABS 0.3 07/05/2016 1020   BASOSABS 0.0 07/05/2016 1020      Chemistry      Component Value Date/Time   NA 138 07/05/2016 1020   K 3.9 07/05/2016 1020   CL 103 07/05/2016 1020   CO2 28 07/05/2016 1020   BUN 13 07/05/2016 1020   CREATININE 0.89 07/05/2016 1020      Component Value Date/Time   CALCIUM 9.0 07/05/2016 1020   ALKPHOS 73 07/05/2016 1020   AST 28 07/05/2016 1020   ALT 25 07/05/2016 1020   BILITOT 0.5 07/05/2016 1020     Lab Results  Component Value Date   CA125 9.6 07/05/2016    Lab Results  Component Value Date   CEA 0.5 04/26/2015     PATHOLOGY:     ASSESSMENT AND PLAN:  Stage IVB Ovarian Carcinoma Malignant Ascites Anemia Leukocytosis Stool cards negative X 3  She has done very well with therapy. She achieved an R0 resection at the time of surgery. Note that CT imaging of the chest  at diagnosis in August 2016 suggested metastatic disease in the chest. She has completed all recommended therapy. Currently she is without obvious recurrence, we will call her with her CA-125.  Anemia is better. B12 and folate in April were excellent. We did not pursue  a BMBX. ? Low grade MDS. Counts are stable.   Her last blood transfusion was on 10/16/15.  I have tentatively scheduled her for a follow-up in 3 months, sooner if needed.   She received her flu vaccination today.   I have refilled Percocet and given her 90 tablets instead of 60, so she will not need to worry about needing a refill as often. Patient relies on her son for transportation to and from pharmacy/doctor's office.    Orders Placed This Encounter  Procedures  . CBC with Differential    Standing Status:   Future    Standing Expiration Date:   07/05/2017  . CA 125    Standing Status:   Future    Standing Expiration Date:   07/05/2017  . Comprehensive metabolic panel    Standing Status:   Future    Standing Expiration Date:   07/05/2017    Meds ordered this encounter  Medications  . Influenza vac split quadrivalent PF (FLUARIX) injection 0.5 mL  . oxyCODONE-acetaminophen (PERCOCET/ROXICET) 5-325 MG tablet    Sig: Take 1 tablet by mouth every 4 (four) hours as needed for severe pain.    Dispense:  90 tablet    Refill:  0    All questions were answered. The patient knows to call the clinic with any problems, questions or concerns. We can certainly see the patient much sooner if necessary.  This document serves as a record of services personally performed by Ancil Linsey, MD. It was created on her behalf by Elmyra Ricks, a trained medical scribe. The creation of this record is based on the scribe's personal observations and the provider's statements to them. This document has been checked and approved by the attending provider.  I have reviewed the above documentation for accuracy and completeness, and I agree  with the above.  This note is electronically signed by: Molli Hazard, MD  5:18 PM

## 2016-07-05 NOTE — Patient Instructions (Signed)
[place] at [facility] Discharge Instructions  RECOMMENDATIONS MADE BY THE CONSULTANT AND ANY TEST RESULTS WILL BE SENT TO YOUR REFERRING PHYSICIAN.   Flu vaccine given today  RTC 3 months with labs  Thank you for choosing [place] at [facility] to provide your oncology and hematology care.  To afford each patient quality time with our provider, please arrive at least 15 minutes before your scheduled appointment time.   Beginning January 23rd 2017 lab work for the [facility] will be done in the  Main lab at [facility] on 1st floor. If you have a lab appointment with the [place] please come in thru the  Main Entrance and check in at the main information desk  You need to re-schedule your appointment should you arrive 10 or more minutes late.  We strive to give you quality time with our providers, and arriving late affects you and other patients whose appointments are after yours.  Also, if you no show three or more times for appointments you may be dismissed from the clinic at the providers discretion.     Again, thank you for choosing [facility].  Our hope is that these requests will decrease the amount of time that you wait before being seen by our physicians.       _____________________________________________________________  Should you have questions after your visit to [facility], please contact our office at [phone] between the hours of 8:30 a.m. and 4:30 p.m.  Voicemails left after 4:30 p.m. will not be returned until the following business day.  For prescription refill requests, have your pharmacy contact our office.         Resources For Cancer Patients and their Caregivers ? American Cancer Society: Can assist with transportation, wigs, general needs, runs Look Good Feel Better.        817-095-5391 ? Cancer Care: Provides financial assistance, online support groups,  medication/co-pay assistance.  1-800-813-HOPE 347 531 8622) ? [place] Assists [place] cancer patients and their families through emotional , educational and financial support.  905-240-8622 ? [place] DSS Where to apply for food stamps, Medicaid and utility assistance. 714-841-0912 ? RCATS: Transportation to medical appointments. 646-281-2560 ? Social Security Administration: May apply for disability if have a Stage IV cancer. [phone] ? [employer], Disability and Transit Services: Assists with nutrition, care and transit needs. [place] Support Programs: @10RELATIVEDAYS @ > Cancer Support Group  2nd Tuesday of the month 1pm-2pm, Journey Room  > Creative Journey  3rd Tuesday of the month 1130am-1pm, Journey Room  > Look Good Feel Better  1st Wednesday of the month 10am-12 noon, Journey Room (Call [place] to register [phone])

## 2016-07-05 NOTE — Patient Instructions (Signed)
[place] at [facility] Discharge Instructions  RECOMMENDATIONS MADE BY THE CONSULTANT AND ANY TEST RESULTS WILL BE SENT TO YOUR REFERRING PHYSICIAN.  Port a cath flushed with labs done Follow up as scheduled  Thank you for choosing [place] at [facility] to provide your oncology and hematology care.  To afford each patient quality time with our provider, please arrive at least 15 minutes before your scheduled appointment time.   Beginning January 23rd 2017 lab work for the [facility] will be done in the  Main lab at [facility] on 1st floor. If you have a lab appointment with the [facility] please come in thru the  Main Entrance and check in at the main information desk  You need to re-schedule your appointment should you arrive 10 or more minutes late.  We strive to give you quality time with our providers, and arriving late affects you and other patients whose appointments are after yours.  Also, if you no show three or more times for appointments you may be dismissed from the clinic at the providers discretion.     Again, thank you for choosing [facility].  Our hope is that these requests will decrease the amount of time that you wait before being seen by our physicians.       _____________________________________________________________  Should you have questions after your visit to [facility], please contact our office at [phone] between the hours of 8:30 a.m. and 4:30 p.m.  Voicemails left after 4:30 p.m. will not be returned until the following business day.  For prescription refill requests, have your pharmacy contact our office.         Resources For Cancer Patients and their Caregivers ? American Cancer Society: Can assist with transportation, wigs, general needs, runs Look Good Feel Better.        737-671-4012 ? Cancer Care: Provides financial assistance, online support groups,  medication/co-pay assistance.  1-800-813-HOPE (240) 637-2897) ? [place] Assists [place] cancer patients and their families through emotional , educational and financial support.  714-647-8408 ? [place] DSS Where to apply for food stamps, Medicaid and utility assistance. (484)240-1367 ? RCATS: Transportation to medical appointments. 863-196-3622 ? Social Security Administration: May apply for disability if have a Stage IV cancer. 601-618-2076 618-043-8700 ? [employer], Disability and Transit Services: Assists with nutrition, care and transit needs. [place] Support Programs: @10RELATIVEDAYS @ > Cancer Support Group  2nd Tuesday of the month 1pm-2pm, Journey Room  > Creative Journey  3rd Tuesday of the month 1130am-1pm, Journey Room  > Look Good Feel Better  1st Wednesday of the month 10am-12 noon, Journey Room (Call [place] to register [phone])

## 2016-07-06 LAB — CA 125: CA 125: 9.6 U/mL (ref 0.0–38.1)

## 2016-07-21 ENCOUNTER — Encounter (HOSPITAL_COMMUNITY): Payer: Self-pay | Admitting: Hematology & Oncology

## 2016-08-30 ENCOUNTER — Encounter (HOSPITAL_COMMUNITY): Payer: Medicare Other

## 2016-10-05 ENCOUNTER — Encounter (HOSPITAL_BASED_OUTPATIENT_CLINIC_OR_DEPARTMENT_OTHER): Payer: Medicare Other

## 2016-10-05 ENCOUNTER — Encounter (HOSPITAL_COMMUNITY): Payer: Self-pay | Admitting: Hematology & Oncology

## 2016-10-05 ENCOUNTER — Encounter (HOSPITAL_COMMUNITY): Payer: Medicare Other | Attending: Hematology & Oncology | Admitting: Hematology & Oncology

## 2016-10-05 VITALS — BP 152/65 | HR 78 | Temp 97.6°F | Resp 16 | Wt 148.7 lb

## 2016-10-05 DIAGNOSIS — Z95828 Presence of other vascular implants and grafts: Secondary | ICD-10-CM

## 2016-10-05 DIAGNOSIS — Z452 Encounter for adjustment and management of vascular access device: Secondary | ICD-10-CM | POA: Diagnosis present

## 2016-10-05 DIAGNOSIS — D649 Anemia, unspecified: Secondary | ICD-10-CM | POA: Diagnosis not present

## 2016-10-05 DIAGNOSIS — R180 Malignant ascites: Secondary | ICD-10-CM | POA: Diagnosis not present

## 2016-10-05 DIAGNOSIS — C562 Malignant neoplasm of left ovary: Secondary | ICD-10-CM | POA: Diagnosis present

## 2016-10-05 DIAGNOSIS — C569 Malignant neoplasm of unspecified ovary: Secondary | ICD-10-CM | POA: Diagnosis present

## 2016-10-05 DIAGNOSIS — D72829 Elevated white blood cell count, unspecified: Secondary | ICD-10-CM | POA: Diagnosis not present

## 2016-10-05 DIAGNOSIS — D7589 Other specified diseases of blood and blood-forming organs: Secondary | ICD-10-CM

## 2016-10-05 LAB — CBC WITH DIFFERENTIAL/PLATELET
Basophils Absolute: 0 10*3/uL (ref 0.0–0.1)
Basophils Relative: 1 %
EOS ABS: 0.2 10*3/uL (ref 0.0–0.7)
EOS PCT: 3 %
HCT: 33.1 % — ABNORMAL LOW (ref 36.0–46.0)
Hemoglobin: 11.2 g/dL — ABNORMAL LOW (ref 12.0–15.0)
LYMPHS ABS: 1.1 10*3/uL (ref 0.7–4.0)
LYMPHS PCT: 16 %
MCH: 35.3 pg — AB (ref 26.0–34.0)
MCHC: 33.8 g/dL (ref 30.0–36.0)
MCV: 104.4 fL — AB (ref 78.0–100.0)
Monocytes Absolute: 0.6 10*3/uL (ref 0.1–1.0)
Monocytes Relative: 9 %
Neutro Abs: 4.7 10*3/uL (ref 1.7–7.7)
Neutrophils Relative %: 71 %
PLATELETS: 275 10*3/uL (ref 150–400)
RBC: 3.17 MIL/uL — AB (ref 3.87–5.11)
RDW: 12.7 % (ref 11.5–15.5)
WBC: 6.5 10*3/uL (ref 4.0–10.5)

## 2016-10-05 MED ORDER — HEPARIN SOD (PORK) LOCK FLUSH 100 UNIT/ML IV SOLN
500.0000 [IU] | Freq: Once | INTRAVENOUS | Status: AC
  Administered 2016-10-05: 500 [IU] via INTRAVENOUS
  Filled 2016-10-05: qty 5

## 2016-10-05 MED ORDER — SODIUM CHLORIDE 0.9% FLUSH
10.0000 mL | INTRAVENOUS | Status: DC | PRN
  Administered 2016-10-05: 10 mL via INTRAVENOUS
  Filled 2016-10-05: qty 10

## 2016-10-05 NOTE — Patient Instructions (Signed)
[place] at [facility] Discharge Instructions  RECOMMENDATIONS MADE BY THE CONSULTANT AND ANY TEST RESULTS WILL BE SENT TO YOUR REFERRING PHYSICIAN.  You were seen today by Dr. Whitney Muse Follow up in 3 months with labs  Thank you for choosing [place] at [facility] to provide your oncology and hematology care.  To afford each patient quality time with our provider, please arrive at least 15 minutes before your scheduled appointment time.    If you have a lab appointment with the [place] please come in thru the  Main Entrance and check in at the main information desk  You need to re-schedule your appointment should you arrive 10 or more minutes late.  We strive to give you quality time with our providers, and arriving late affects you and other patients whose appointments are after yours.  Also, if you no show three or more times for appointments you may be dismissed from the clinic at the providers discretion.     Again, thank you for choosing [facility].  Our hope is that these requests will decrease the amount of time that you wait before being seen by our physicians.       _____________________________________________________________  Should you have questions after your visit to [facility], please contact our office at [phone] between the hours of 8:30 a.m. and 4:30 p.m.  Voicemails left after 4:30 p.m. will not be returned until the following business day.  For prescription refill requests, have your pharmacy contact our office.       Resources For Cancer Patients and their Caregivers ? American Cancer Society: Can assist with transportation, wigs, general needs, runs Look Good Feel Better.        ([phone] ? Cancer Care: Provides financial assistance, online support groups, medication/co-pay assistance.  1-800-813-HOPE 225-734-1291) ? [place] Assists [place]  cancer patients and their families through emotional , educational and financial support.  (202) 810-5946 ? [place] DSS Where to apply for food stamps, Medicaid and utility assistance. (838) 012-7625 ? RCATS: Transportation to medical appointments. (763)210-0125 ? Social Security Administration: May apply for disability if have a Stage IV cancer. (236)414-9089 9018705211 ? [employer], Disability and Transit Services: Assists with nutrition, care and transit needs. [place] Support Programs: @10RELATIVEDAYS @ > Cancer Support Group  2nd Tuesday of the month 1pm-2pm, Journey Room  > Creative Journey  3rd Tuesday of the month 1130am-1pm, Journey Room  > Look Good Feel Better  1st Wednesday of the month 10am-12 noon, Journey Room (Call [place] to register [phone])

## 2016-10-05 NOTE — Patient Instructions (Signed)
[place] at [facility] Discharge Instructions  RECOMMENDATIONS MADE BY THE CONSULTANT AND ANY TEST RESULTS WILL BE SENT TO YOUR REFERRING PHYSICIAN.  Port flush done with labs Follow up as scheduled.  Thank you for choosing [place] at [facility] to provide your oncology and hematology care.  To afford each patient quality time with our provider, please arrive at least 15 minutes before your scheduled appointment time.    If you have a lab appointment with the [place] please come in thru the  Main Entrance and check in at the main information desk  You need to re-schedule your appointment should you arrive 10 or more minutes late.  We strive to give you quality time with our providers, and arriving late affects you and other patients whose appointments are after yours.  Also, if you no show three or more times for appointments you may be dismissed from the clinic at the providers discretion.     Again, thank you for choosing [facility].  Our hope is that these requests will decrease the amount of time that you wait before being seen by our physicians.       _____________________________________________________________  Should you have questions after your visit to [facility], please contact our office at [phone] between the hours of 8:30 a.m. and 4:30 p.m.  Voicemails left after 4:30 p.m. will not be returned until the following business day.  For prescription refill requests, have your pharmacy contact our office.       Resources For Cancer Patients and their Caregivers ? American Cancer Society: Can assist with transportation, wigs, general needs, runs Look Good Feel Better.        202-113-2007 ? Cancer Care: Provides financial assistance, online support groups, medication/co-pay assistance.  1-800-813-HOPE 636-301-0922) ? [place] Assists [place] cancer patients  and their families through emotional , educational and financial support.  ([phone] ? [place] DSS Where to apply for food stamps, Medicaid and utility assistance. 618-345-5275 ? RCATS: Transportation to medical appointments. 308-717-3147 ? Social Security Administration: May apply for disability if have a Stage IV cancer. 856-028-7984 7827257699 ? [employer], Disability and Transit Services: Assists with nutrition, care and transit needs. [employer] Support Programs: @10RELATIVEDAYS @ > Cancer Support Group  2nd Tuesday of the month 1pm-2pm, Journey Room  > Creative Journey  3rd Tuesday of the month 1130am-1pm, Journey Room  > Look Good Feel Better  1st Wednesday of the month 10am-12 noon, Journey Room (Call [place] to register [phone])

## 2016-10-05 NOTE — Progress Notes (Signed)
Latasha [name] presented for Portacath access and flush. Portacath located right  chest wall accessed with  H 20 needle. Good blood return present. Portacath flushed with 34m NS and 500U/552mHeparin and needle removed intact. Procedure without incident. Patient tolerated procedure well.  Labs drawn per orders.

## 2016-10-07 ENCOUNTER — Other Ambulatory Visit: Payer: Self-pay | Admitting: Nurse Practitioner

## 2016-10-07 LAB — CA 125: CA 125: 16.9 U/mL (ref 0.0–38.1)

## 2016-10-20 ENCOUNTER — Encounter (HOSPITAL_COMMUNITY): Payer: Self-pay | Admitting: Hematology & Oncology

## 2016-10-20 NOTE — Progress Notes (Signed)
Latasha [name] at [place] NOTE  No PCP Per Patient No address on file    Ovarian cancer [facility])   04/25/2015 - 04/28/2015 Hospital Admission    Nausea/diarrhea.  Oncology consult completed on 04/27/2015.      04/25/2015 Tumor Marker    CA 125- 3902.      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      04/27/2015 Procedure    US Paracentesis- A total of approximately 3700 mL of amber colored fluid was removed. A fluid sample was sent for laboratory analysis.      04/27/2015 Imaging    CT Chest- Mild left supraclavicular lymphadenopathy and moderate mediastinal lymphadenopathy involving the prevascular, subcarinal and bilateral pericardiophrenic nodal chains, likely metastatic.      04/27/2015 Pathology Results    Diagnosis PERITONEAL/ASCITIC FLUID(SPECIMEN 1 OF 1 COLLECTED 04/27/15): MALIGNANT CELLS CONSISTENT WITH METASTATIC ADENOCARCINOMA.  The immunophenotype is most consistent with a gynecologic primary, most likely ovary.      05/08/2015 Miscellaneous    Seen by Dr. Denman George- recommending Carboplatin/Paclitaxel x 3 cycles and return visit to see her (within 1 week of administration of third cycle) to evaluate for optimal sequencing of treatment modalities.      05/13/2015 - 05/15/2015 Hospital Admission    Hospitalized for AKI      05/19/2015 - 07/01/2015 Chemotherapy    Carboplatin/Paclitaxel x 3 cycles      07/28/2015 Surgery    Exploratory laparotomy with total abdominal hysterectomy, bilateral salpingo-oophorectomy, omentectomy radical tumor debulking for ovarian cancer; by Dr. Everitt Amber.      07/28/2015 Pathology Results    Uterus +/- tubes/ovaries, neoplastic, cervix - HIGH GRADE SEROUS CARCINOMA INVOLVING LEFT OVARY, LEFT FALLOPIAN TUBE AND RIGHT FALLOPIAN TUBE. - CERVIX, ENDOMETRIUM AND  MYOMETRIUM ARE FREE OF TUMOR. 2. Cul-de-sac biop...      08/26/2015 -  Chemotherapy    Carboplatin/Paclitaxel x 3 cycles      12/23/2015 Miscellaneous    Genetic counseling.  Genetic testing was normal, and did not reveal a deleterious mutation in these genes       CURRENT THERAPY: Observation  INTERVAL HISTORY: Latasha [name] 76 y.o. 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 She completed chemotherapy and surgical therapy.   Patient is accompanied by her son. 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  Latasha [name] denies poor appetite, abdominal pain, abdominal swelling, nausea, vomiting, bowel issues, difficulty sleeping, or neuropathy.   She has not had the flu shot this year and would like to receive it today.   Patient needs a refill for her pain medication.   She notes that she is doing well. She does all of her ADL's without difficulty.  Her son also confirms this.    Past Medical History:  Diagnosis Date  . Arthritis   . B12 deficiency   . Cataract   . History of blood transfusion   . History of chemotherapy   . Hypertension   . Iron deficiency anemia   . Mixed hyperlipidemia   . Ovarian cancer ([place])   . 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 Schizophrenia ([place])   .  Ulcerative colitis ([place])   . Vitamin D deficiency     has Renal failure; Nausea without vomiting; Ascites, malignant; Ovarian cancer ([place]); Omental mass; Acute kidney injury ([place]); UTI (lower urinary tract infection); Normocytic anemia; IBD (inflammatory bowel disease); Schizophrenia, unspecified type ([place]); Diarrhea; Malnutrition of moderate degree ([place]); Acute renal failure ([place]); Ileus, postoperative ([place]); and Genetic testing on her problem list.     has No Known Allergies.  Current Outpatient Prescriptions  on File Prior to Visit  Medication Sig Dispense Refill  . acetaminophen (TYLENOL) 500 MG tablet Take 2 tablets (1,000 mg total) by mouth every 12 (twelve) hours. 30 tablet 0  . amLODipine (NORVASC) 10 MG tablet Take 10 mg by mouth every evening.     Marland Kitchen aspirin EC 81 MG tablet Take 81 mg by mouth daily.    . Cholecalciferol (VITAMIN D-3) 1000 UNITS CAPS Take 1,000 Units by mouth daily.     Marland Kitchen docusate sodium (COLACE) 100 MG capsule Take 100 mg by mouth 2 (two) times daily.    . ferrous sulfate 325 (65 FE) MG EC tablet Take 325 mg by mouth 2 (two) times daily.    . folic acid (FOLVITE) 1 MG tablet Take 1 mg by mouth daily.    Marland Kitchen lovastatin (MEVACOR) 10 MG tablet Take 10 mg by mouth at bedtime.    . megestrol (MEGACE) 400 MG/10ML suspension Take 10 mLs (400 mg total) by mouth daily. 240 mL 2  . metoprolol tartrate (LOPRESSOR) 25 MG tablet Take 1 tablet (25 mg total) by mouth 2 (two) times daily. 60 tablet 6  . mirtazapine (REMERON) 15 MG tablet Take 7.5 mg by mouth at bedtime.    . Multiple Vitamin (MULTIVITAMIN WITH MINERALS) TABS tablet Take 1 tablet by mouth daily.    . ondansetron (ZOFRAN) 4 MG tablet Take 1 tablet (4 mg total) by mouth every 6 (six) hours as needed for nausea. 20 tablet 0  . oxyCODONE-acetaminophen (PERCOCET/ROXICET) 5-325 MG tablet Take 1 tablet by mouth every 4 (four) hours as needed for severe pain. 90 tablet 0  . QUEtiapine (SEROQUEL XR) 300 MG 24 hr tablet Take 300 mg by mouth at bedtime.    . sulfaSALAzine (AZULFIDINE) 500 MG tablet Take 500 mg by mouth 3 (three) times daily.    . traMADol (ULTRAM) 50 MG tablet Take 1 tablet (50 mg total) by mouth every 6 (six) hours as needed. 90 tablet 1  . vitamin B-12 (CYANOCOBALAMIN) 1000 MCG tablet Take 1,000 mcg by mouth daily.     No current facility-administered medications on file prior to visit.     Past Surgical History:  Procedure Laterality Date  . ABDOMINAL HYSTERECTOMY N/A 07/28/2015   Procedure: TOTAL HYSTERECTOMY  ABDOMINAL BILATERAL SALPINGO OOPHRORECTOMY;  Surgeon: Everitt Amber, MD;  Location: [facility];  Service: Gynecology;  Laterality: N/A;  . DEBULKING N/A 07/28/2015   Procedure: DEBULKING;  Surgeon: Everitt Amber, MD;  Location: [facility];  Service: Gynecology;  Laterality: N/A;  . LAPAROTOMY N/A 07/28/2015   Procedure: EXPLORATORY LAPAROTOMY;  Surgeon: Everitt Amber, MD;  Location: [facility];  Service: Gynecology;  Laterality: N/A;  . OMENTECTOMY N/A 07/28/2015   Procedure: OMENTECTOMY ;  Surgeon: Everitt Amber, MD;  Location: [facility];  Service: Gynecology;  Laterality: N/A;  . PARACENTESIS  04/27/15  . PORTACATH PLACEMENT Right 04/2015  . REPLACEMENT TOTAL KNEE     right knee in 2003    Denies any headaches, dizziness, double vision, fevers, chills, night sweats, nausea, vomiting, diarrhea,  constipation, chest pain, heart palpitations, shortness of breath, blood in stool, black tarry stool, urinary pain, urinary burning, urinary frequency, hematuria. Positive for joint pain. Intermittent left knee pain 14 point review of systems was performed and is negative except as detailed under history of present illness and above  PHYSICAL EXAMINATION  ECOG PERFORMANCE STATUS: 1 - Symptomatic but completely ambulatory  Vitals:   10/05/16 1017  BP: (!) 152/65  Pulse: 78  Resp: 16  Temp: 97.6 F (36.4 C)    GENERAL:alert, no distress, well nourished, well developed, comfortable, cooperative, smiling and accompanied by her son, Latasha [name]. SKIN: skin color, texture, turgor are normal, no rashes or significant lesions HEAD: Normocephalic, No masses, lesions, tenderness or abnormalities EYES: normal, PERRLA, EOMI, Conjunctiva are pink and non-injected EARS: External ears normal OROPHARYNX:lips, buccal mucosa, and tongue normal and mucous membranes are moist  NECK: supple, trachea midline LYMPH:  NO palpable adenopathy in the neck, axillae  BREAST:not examined LUNGS: CTA B/L HEART: RRR without murmur, rub or  gallop. ABDOMEN:abdomen soft and non-tender, no rebound or guarding. Well healed surgical incision site.  BACK: Back symmetric, no curvature. EXTREMITIES:less then 2 second capillary refill, no joint deformities, effusion, or inflammation, no skin discoloration, positive findings:   NEURO: alert & oriented x 3 with fluent speech, no focal motor/sensory deficits, gait normal   LABORATORY DATA: I have reviewed the data as listed CBC    Component Value Date/Time   WBC 6.5 10/05/2016 1030   RBC 3.17 (L) 10/05/2016 1030   HGB 11.2 (L) 10/05/2016 1030   HCT 33.1 (L) 10/05/2016 1030   PLT 275 10/05/2016 1030   MCV 104.4 (H) 10/05/2016 1030   MCH 35.3 (H) 10/05/2016 1030   MCHC 33.8 10/05/2016 1030   RDW 12.7 10/05/2016 1030   LYMPHSABS 1.1 10/05/2016 1030   MONOABS 0.6 10/05/2016 1030   EOSABS 0.2 10/05/2016 1030   BASOSABS 0.0 10/05/2016 1030      Chemistry      Component Value Date/Time   NA 138 07/05/2016 1020   K 3.9 07/05/2016 1020   CL 103 07/05/2016 1020   CO2 28 07/05/2016 1020   BUN 13 07/05/2016 1020   CREATININE 0.89 07/05/2016 1020      Component Value Date/Time   CALCIUM 9.0 07/05/2016 1020   ALKPHOS 73 07/05/2016 1020   AST 28 07/05/2016 1020   ALT 25 07/05/2016 1020   BILITOT 0.5 07/05/2016 1020     Lab Results  Component Value Date   CA125 16.9 10/05/2016    Lab Results  Component Value Date   CEA 0.5 04/26/2015     PATHOLOGY:     ASSESSMENT AND PLAN:  Stage IVB Ovarian Carcinoma Malignant Ascites Anemia Leukocytosis Stool cards negative X 3  She has done very well with therapy. She achieved an R0 resection at the time of surgery. Note that CT imaging of the chest at diagnosis in August 2016 suggested metastatic disease in the chest. She has completed all recommended therapy. Currently she is without obvious recurrence, we will call her with her CA-125.  Anemia is better. B12 and folate in April were excellent. We did not pursue a BMBX. ?  Low grade MDS. Counts are stable. Macrocytosis persists. Will continue to monitor. Her last blood transfusion was on 10/16/15, one year ago.   She received her flu vaccination today.   I have refilled Percocet and given her 90 tablets instead of 60, so she will not need to worry about needing a  refill as often. Patient relies on her son for transportation to and from pharmacy/doctor's office.   RTC 3 months with ongoing observation, PE and labs including CA-125.    Orders Placed This Encounter  Procedures  . CBC with Differential    Standing Status:   Future    Standing Expiration Date:   02/02/2017  . Comprehensive metabolic panel    Standing Status:   Future    Standing Expiration Date:   02/02/2017  . CA 125    Standing Status:   Future    Standing Expiration Date:   02/02/2017    All questions were answered. The patient knows to call the clinic with any problems, questions or concerns. We can certainly see the patient much sooner if necessary.  This document serves as a record of services personally performed by Ancil Linsey, MD. It was created on her behalf by Elmyra Ricks, a trained medical scribe. The creation of this record is based on the scribe's personal observations and the provider's statements to them. This document has been checked and approved by the attending provider.  I have reviewed the above documentation for accuracy and completeness, and I agree with the above.  This note is electronically signed by: Molli Hazard, MD  3:51 PM

## 2016-11-08 ENCOUNTER — Other Ambulatory Visit (HOSPITAL_COMMUNITY): Payer: Self-pay | Admitting: Adult Health

## 2016-11-08 DIAGNOSIS — I10 Essential (primary) hypertension: Secondary | ICD-10-CM

## 2016-11-08 DIAGNOSIS — C562 Malignant neoplasm of left ovary: Secondary | ICD-10-CM

## 2016-11-08 MED ORDER — METOPROLOL TARTRATE 25 MG PO TABS: 25.0000 mg | ORAL_TABLET | Freq: Two times a day (BID) | ORAL | 6 refills | Status: DC

## 2016-11-30 ENCOUNTER — Encounter (HOSPITAL_COMMUNITY): Payer: Medicare Other | Attending: Hematology & Oncology

## 2016-11-30 VITALS — BP 148/58 | HR 73 | Temp 97.7°F | Resp 18

## 2016-11-30 DIAGNOSIS — Z95828 Presence of other vascular implants and grafts: Secondary | ICD-10-CM

## 2016-11-30 DIAGNOSIS — R180 Malignant ascites: Secondary | ICD-10-CM | POA: Insufficient documentation

## 2016-11-30 DIAGNOSIS — C569 Malignant neoplasm of unspecified ovary: Secondary | ICD-10-CM | POA: Insufficient documentation

## 2016-11-30 DIAGNOSIS — C562 Malignant neoplasm of left ovary: Secondary | ICD-10-CM

## 2016-11-30 DIAGNOSIS — Z452 Encounter for adjustment and management of vascular access device: Secondary | ICD-10-CM

## 2016-11-30 DIAGNOSIS — D72829 Elevated white blood cell count, unspecified: Secondary | ICD-10-CM | POA: Insufficient documentation

## 2016-11-30 MED ORDER — SODIUM CHLORIDE 0.9% FLUSH
10.0000 mL | Freq: Once | INTRAVENOUS | Status: AC
  Administered 2016-11-30: 10 mL via INTRAVENOUS

## 2016-11-30 MED ORDER — HEPARIN SOD (PORK) LOCK FLUSH 100 UNIT/ML IV SOLN
500.0000 [IU] | Freq: Once | INTRAVENOUS | Status: AC
  Administered 2016-11-30: 500 [IU] via INTRAVENOUS
  Filled 2016-11-30: qty 5

## 2016-11-30 NOTE — Progress Notes (Signed)
Latasha [name] presented for Portacath access and flush. Proper placement of portacath confirmed by CXR. Portacath located right chest wall accessed with  H 20 needle. Good blood return present. Portacath flushed with 53m NS and 500U/568mHeparin and needle removed intact. Procedure without incident. Patient tolerated procedure well.

## 2016-11-30 NOTE — Patient Instructions (Signed)
[place] at [facility] Discharge Instructions  RECOMMENDATIONS MADE BY THE CONSULTANT AND ANY TEST RESULTS WILL BE SENT TO YOUR REFERRING PHYSICIAN.  Port flush done today as ordered. Return as scheduled.  Thank you for choosing [place] at [facility] to provide your oncology and hematology care.  To afford each patient quality time with our provider, please arrive at least 15 minutes before your scheduled appointment time.    If you have a lab appointment with the [place] please come in thru the  Main Entrance and check in at the main information desk  You need to re-schedule your appointment should you arrive 10 or more minutes late.  We strive to give you quality time with our providers, and arriving late affects you and other patients whose appointments are after yours.  Also, if you no show three or more times for appointments you may be dismissed from the clinic at the providers discretion.     Again, thank you for choosing [facility].  Our hope is that these requests will decrease the amount of time that you wait before being seen by our physicians.       _____________________________________________________________  Should you have questions after your visit to [facility], please contact our office at [phone] between the hours of 8:30 a.m. and 4:30 p.m.  Voicemails left after 4:30 p.m. will not be returned until the following business day.  For prescription refill requests, have your pharmacy contact our office.       Resources For Cancer Patients and their Caregivers ? American Cancer Society: Can assist with transportation, wigs, general needs, runs Look Good Feel Better.        657-568-4613 ? Cancer Care: Provides financial assistance, online support groups, medication/co-pay assistance.  1-800-813-HOPE (518)118-6694) ? [place] Assists [place] cancer  patients and their families through emotional , educational and financial support.  (661) 701-6398 ? [place] DSS Where to apply for food stamps, Medicaid and utility assistance. 517-750-8028 ? [employer]: Transportation to medical appointments. 604-422-6498 ? Social Security Administration: May apply for disability if have a Stage IV cancer. 805-512-3332 631-585-6345 ? [employer], Disability and Transit Services: Assists with nutrition, care and transit needs. [place] Support Programs: @10RELATIVEDAYS @ > Cancer Support Group  2nd Tuesday of the month 1pm-2pm, Journey Room  > Creative Journey  3rd Tuesday of the month 1130am-1pm, Journey Room  > Look Good Feel Better  1st Wednesday of the month 10am-12 noon, Journey Room (Call [place] to register [phone])

## 2017-01-06 ENCOUNTER — Encounter (HOSPITAL_BASED_OUTPATIENT_CLINIC_OR_DEPARTMENT_OTHER): Payer: Medicare Other

## 2017-01-06 ENCOUNTER — Encounter (HOSPITAL_COMMUNITY): Payer: Medicare Other | Attending: Hematology | Admitting: Hematology

## 2017-01-06 ENCOUNTER — Encounter (HOSPITAL_COMMUNITY): Payer: Self-pay | Admitting: Hematology

## 2017-01-06 VITALS — HR 73 | Temp 97.7°F | Resp 18

## 2017-01-06 DIAGNOSIS — D649 Anemia, unspecified: Secondary | ICD-10-CM

## 2017-01-06 DIAGNOSIS — Z8543 Personal history of malignant neoplasm of ovary: Secondary | ICD-10-CM | POA: Diagnosis present

## 2017-01-06 DIAGNOSIS — M179 Osteoarthritis of knee, unspecified: Secondary | ICD-10-CM | POA: Diagnosis not present

## 2017-01-06 DIAGNOSIS — G8929 Other chronic pain: Secondary | ICD-10-CM

## 2017-01-06 DIAGNOSIS — R180 Malignant ascites: Secondary | ICD-10-CM | POA: Diagnosis not present

## 2017-01-06 DIAGNOSIS — D72829 Elevated white blood cell count, unspecified: Secondary | ICD-10-CM | POA: Insufficient documentation

## 2017-01-06 DIAGNOSIS — C562 Malignant neoplasm of left ovary: Secondary | ICD-10-CM

## 2017-01-06 DIAGNOSIS — C569 Malignant neoplasm of unspecified ovary: Secondary | ICD-10-CM | POA: Diagnosis present

## 2017-01-06 DIAGNOSIS — Z95828 Presence of other vascular implants and grafts: Secondary | ICD-10-CM

## 2017-01-06 DIAGNOSIS — E559 Vitamin D deficiency, unspecified: Secondary | ICD-10-CM

## 2017-01-06 DIAGNOSIS — D630 Anemia in neoplastic disease: Secondary | ICD-10-CM

## 2017-01-06 LAB — COMPREHENSIVE METABOLIC PANEL
ALT: 28 U/L (ref 14–54)
ANION GAP: 8 (ref 5–15)
AST: 28 U/L (ref 15–41)
Albumin: 4.2 g/dL (ref 3.5–5.0)
Alkaline Phosphatase: 75 U/L (ref 38–126)
BUN: 13 mg/dL (ref 6–20)
CO2: 28 mmol/L (ref 22–32)
Calcium: 9.3 mg/dL (ref 8.9–10.3)
Chloride: 102 mmol/L (ref 101–111)
Creatinine, Ser: 0.92 mg/dL (ref 0.44–1.00)
GFR calc Af Amer: >60 mL/min (ref >60)
Glucose, Bld: 86 mg/dL (ref 65–99)
POTASSIUM: 4 mmol/L (ref 3.5–5.1)
Sodium: 138 mmol/L (ref 135–145)
TOTAL PROTEIN: 7 g/dL (ref 6.5–8.1)
Total Bilirubin: 0.5 mg/dL (ref 0.3–1.2)

## 2017-01-06 LAB — CBC WITH DIFFERENTIAL/PLATELET
BASOS PCT: 1 %
Basophils Absolute: 0 10*3/uL (ref 0.0–0.1)
Eosinophils Absolute: 0.2 10*3/uL (ref 0.0–0.7)
Eosinophils Relative: 4 %
HEMATOCRIT: 33.7 % — AB (ref 36.0–46.0)
Hemoglobin: 11.3 g/dL — ABNORMAL LOW (ref 12.0–15.0)
LYMPHS PCT: 28 %
Lymphs Abs: 1.4 10*3/uL (ref 0.7–4.0)
MCH: 35.1 pg — ABNORMAL HIGH (ref 26.0–34.0)
MCHC: 33.5 g/dL (ref 30.0–36.0)
MCV: 104.7 fL — AB (ref 78.0–100.0)
MONO ABS: 0.7 10*3/uL (ref 0.1–1.0)
MONOS PCT: 14 %
NEUTROS ABS: 2.7 10*3/uL (ref 1.7–7.7)
Neutrophils Relative %: 53 %
Platelets: 276 10*3/uL (ref 150–400)
RBC: 3.22 MIL/uL — ABNORMAL LOW (ref 3.87–5.11)
RDW: 13.3 % (ref 11.5–15.5)
WBC: 5 10*3/uL (ref 4.0–10.5)

## 2017-01-06 MED ORDER — OXYCODONE-ACETAMINOPHEN 5-325 MG PO TABS: 1.0000 | ORAL_TABLET | ORAL | 0 refills | Status: DC | PRN

## 2017-01-06 MED ORDER — SODIUM CHLORIDE 0.9% FLUSH
10.0000 mL | INTRAVENOUS | Status: DC | PRN
Start: 2017-01-06 — End: 2017-01-06
  Administered 2017-01-06: 10 mL via INTRAVENOUS
  Filled 2017-01-06: qty 10

## 2017-01-06 MED ORDER — HEPARIN SOD (PORK) LOCK FLUSH 100 UNIT/ML IV SOLN
500.0000 [IU] | Freq: Once | INTRAVENOUS | Status: AC
Start: 2017-01-06 — End: 2017-01-06
  Administered 2017-01-06: 500 [IU] via INTRAVENOUS
  Filled 2017-01-06: qty 5

## 2017-01-06 MED ORDER — TRAMADOL HCL 50 MG PO TABS: 50.0000 mg | ORAL_TABLET | Freq: Four times a day (QID) | ORAL | 1 refills | Status: DC | PRN

## 2017-01-06 NOTE — Patient Instructions (Addendum)
[place] at [facility] Discharge Instructions  RECOMMENDATIONS MADE BY THE CONSULTANT AND ANY TEST RESULTS WILL BE SENT TO YOUR REFERRING PHYSICIAN.  You were seen today by Dr. Irene Limbo Continue port flush every 8 weeks   Follow up and labs in 3 months. See Amy up front for appointments   Thank you for choosing [place] at [facility] to provide your oncology and hematology care.  To afford each patient quality time with our provider, please arrive at least 15 minutes before your scheduled appointment time.    If you have a lab appointment with the [place] please come in thru the  Main Entrance and check in at the main information desk  You need to re-schedule your appointment should you arrive 10 or more minutes late.  We strive to give you quality time with our providers, and arriving late affects you and other patients whose appointments are after yours.  Also, if you no show three or more times for appointments you may be dismissed from the clinic at the providers discretion.     Again, thank you for choosing [facility].  Our hope is that these requests will decrease the amount of time that you wait before being seen by our physicians.       _____________________________________________________________  Should you have questions after your visit to [facility], please contact our office at [phone] between the hours of 8:30 a.m. and 4:30 p.m.  Voicemails left after 4:30 p.m. will not be returned until the following business day.  For prescription refill requests, have your pharmacy contact our office.       Resources For Cancer Patients and their Caregivers ? American Cancer Society: Can assist with transportation, wigs, general needs, runs Look Good Feel Better.        520-164-7749 ? Cancer Care: Provides financial assistance, online support groups, medication/co-pay assistance.   1-800-813-HOPE 971-013-1226) ? [place] Assists [place] cancer patients and their families through emotional , educational and financial support.  (309) 343-0838 ? [place] DSS Where to apply for food stamps, Medicaid and utility assistance. 431-744-7001 ? [employer]: Transportation to medical appointments. (854)294-6036 ? Social Security Administration: May apply for disability if have a Stage IV cancer. 260 559 1905 (276)691-4832 ? [employer], Disability and Transit Services: Assists with nutrition, care and transit needs. [place] Support Programs: @10RELATIVEDAYS @ > Cancer Support Group  2nd Tuesday of the month 1pm-2pm, Journey Room  > Creative Journey  3rd Tuesday of the month 1130am-1pm, Journey Room  > Look Good Feel Better  1st Wednesday of the month 10am-12 noon, Journey Room (Call [place] to register [phone])

## 2017-01-06 NOTE — Progress Notes (Signed)
Latasha [name] presented for Portacath access and flush. Portacath located rt chest wall accessed with  H 20 needle. Good blood return present. Portacath flushed with 65m NS and 500U/554mHeparin and needle removed intact. Procedure without incident. Patient tolerated procedure well. Labs drawn per MD orders.

## 2017-01-07 LAB — CA 125: CA 125: 31.7 U/mL (ref 0.0–38.1)

## 2017-01-08 NOTE — Progress Notes (Signed)
Marland Kitchen  HEMATOLOGY ONCOLOGY PROGRESS NOTE  Date of service: .01/06/2017  Patient Care Team: No Pcp Per Patient as PCP - General (General Practice)   CC: f/u for Ovarian Cancer  SUMMARY OF ONCOLOGIC HISTORY:   Ovarian cancer ([place])   04/25/2015 - 04/28/2015 Hospital Admission    Nausea/diarrhea.  Oncology consult completed on 04/27/2015.      04/25/2015 Tumor Marker    CA 125- 3902.      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      04/27/2015 Procedure    US Paracentesis- A total of approximately 3700 mL of amber colored fluid was removed. A fluid sample was sent for laboratory analysis.      04/27/2015 Imaging    CT Chest- Mild left supraclavicular lymphadenopathy and moderate mediastinal lymphadenopathy involving the prevascular, subcarinal and bilateral pericardiophrenic nodal chains, likely metastatic.      04/27/2015 Pathology Results    Diagnosis PERITONEAL/ASCITIC FLUID(SPECIMEN 1 OF 1 COLLECTED 04/27/15): MALIGNANT CELLS CONSISTENT WITH METASTATIC ADENOCARCINOMA.  The immunophenotype is most consistent with a gynecologic primary, most likely ovary.      05/08/2015 Miscellaneous    Seen by Dr. Denman George- recommending Carboplatin/Paclitaxel x 3 cycles and return visit to see her (within 1 week of administration of third cycle) to evaluate for optimal sequencing of treatment modalities.      05/13/2015 - 05/15/2015 Hospital Admission    Hospitalized for AKI      05/19/2015 - 07/01/2015 Chemotherapy    Carboplatin/Paclitaxel x 3 cycles      07/28/2015 Surgery    Exploratory laparotomy with total abdominal hysterectomy, bilateral salpingo-oophorectomy, omentectomy radical tumor debulking for ovarian cancer; by Dr. Everitt Amber.      07/28/2015 Pathology Results    Uterus +/- tubes/ovaries, neoplastic, cervix - HIGH GRADE SEROUS  CARCINOMA INVOLVING LEFT OVARY, LEFT FALLOPIAN TUBE AND RIGHT FALLOPIAN TUBE. - CERVIX, ENDOMETRIUM AND MYOMETRIUM ARE FREE OF TUMOR. 2. Cul-de-sac biop...      08/26/2015 -  Chemotherapy    Carboplatin/Paclitaxel x 3 cycles      12/23/2015 Miscellaneous    Genetic counseling.  Genetic testing was normal, and did not reveal a deleterious mutation in these genes       INTERVAL HISTORY:  Patient is here for her scheduled followup for OVarian cancer after her last clinic visit with Dr Whitney Muse on 10/05/2016 . She notes no abdominal discomfort, no chest pain no SOB. No wt loss. No other acute new focal symptoms. Notes discomfort from her knee arthritis. Given refill for tramadol.   REVIEW OF SYSTEMS:    10 Point review of systems of done and is negative except as noted above.  . Past Medical History:  Diagnosis Date  . Arthritis   . B12 deficiency   . Cataract   . History of blood transfusion   . History of chemotherapy   . Hypertension   . Iron deficiency anemia   . Mixed hyperlipidemia   . Ovarian cancer ([place])   . 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 Schizophrenia ([place])   . Ulcerative colitis ([place])   . Vitamin D deficiency     . Past Surgical History:  Procedure Laterality Date  . ABDOMINAL HYSTERECTOMY N/A 07/28/2015   Procedure: TOTAL HYSTERECTOMY ABDOMINAL BILATERAL SALPINGO OOPHRORECTOMY;  Surgeon: Everitt Amber, MD;  Location: [facility];  Service: Gynecology;  Laterality:  N/A;  . DEBULKING N/A 07/28/2015   Procedure: DEBULKING;  Surgeon: Everitt Amber, MD;  Location: [facility];  Service: Gynecology;  Laterality: N/A;  . LAPAROTOMY N/A 07/28/2015   Procedure: EXPLORATORY LAPAROTOMY;  Surgeon: Everitt Amber, MD;  Location: [facility];  Service: Gynecology;  Laterality: N/A;  . OMENTECTOMY N/A 07/28/2015   Procedure: OMENTECTOMY ;  Surgeon: Everitt Amber, MD;  Location: [facility];  Service: Gynecology;  Laterality: N/A;  . PARACENTESIS  04/27/15  . PORTACATH PLACEMENT Right 04/2015  . REPLACEMENT TOTAL  KNEE     right knee in 2003    . Social History  Substance Use Topics  . Smoking status: Never Smoker  . Smokeless tobacco: Never Used  . Alcohol use No    ALLERGIES:  has No Known Allergies.  MEDICATIONS:  Current Outpatient Prescriptions  Medication Sig Dispense Refill  . acetaminophen (TYLENOL) 500 MG tablet Take 2 tablets (1,000 mg total) by mouth every 12 (twelve) hours. 30 tablet 0  . amLODipine (NORVASC) 10 MG tablet Take 10 mg by mouth every evening.     Marland Kitchen aspirin EC 81 MG tablet Take 81 mg by mouth daily.    . Cholecalciferol (VITAMIN D-3) 1000 UNITS CAPS Take 1,000 Units by mouth daily.     Marland Kitchen docusate sodium (COLACE) 100 MG capsule Take 100 mg by mouth 2 (two) times daily.    . folic acid (FOLVITE) 1 MG tablet Take 1 mg by mouth daily.    Marland Kitchen lovastatin (MEVACOR) 10 MG tablet Take 10 mg by mouth at bedtime.    . metoprolol tartrate (LOPRESSOR) 25 MG tablet Take 1 tablet (25 mg total) by mouth 2 (two) times daily. 60 tablet 6  . mirtazapine (REMERON) 15 MG tablet Take 7.5 mg by mouth at bedtime.    . Multiple Vitamin (MULTIVITAMIN WITH MINERALS) TABS tablet Take 1 tablet by mouth daily.    . ondansetron (ZOFRAN) 4 MG tablet Take 1 tablet (4 mg total) by mouth every 6 (six) hours as needed for nausea. (Patient not taking: Reported on 01/06/2017) 20 tablet 0  . QUEtiapine (SEROQUEL XR) 300 MG 24 hr tablet Take 300 mg by mouth at bedtime.    . sulfaSALAzine (AZULFIDINE) 500 MG tablet Take 500 mg by mouth 3 (three) times daily.    . traMADol (ULTRAM) 50 MG tablet Take 1 tablet (50 mg total) by mouth every 6 (six) hours as needed. 90 tablet 1  . vitamin B-12 (CYANOCOBALAMIN) 1000 MCG tablet Take 1,000 mcg by mouth daily.     No current facility-administered medications for this visit.     PHYSICAL EXAMINATION: ECOG PERFORMANCE STATUS: 1 - Symptomatic but completely ambulatory  . Vitals:   01/06/17 1137  Pulse: 73  Resp: 18  Temp: 97.7 F (36.5 C)    There were no  vitals filed for this visit. .There is no height or weight on file to calculate BMI.  GENERAL:alert, in no acute distress and comfortable SKIN: no acute rashes, no significant lesions EYES: conjunctiva are pink and non-injected, sclera anicteric OROPHARYNX: MMM, no exudates, no oropharyngeal erythema or ulceration NECK: supple, no JVD LYMPH:  no palpable lymphadenopathy in the cervical, axillary or inguinal regions LUNGS: clear to auscultation b/l with normal respiratory effort HEART: regular rate & rhythm ABDOMEN:  normoactive bowel sounds , non tender, not distended. Extremity: no pedal edema PSYCH: alert & oriented x 3 with fluent speech NEURO: no focal motor/sensory deficits  LABORATORY DATA:   I have reviewed the data  as listed  . CBC Latest Ref Rng & Units 01/06/2017 10/05/2016 07/05/2016  WBC 4.0 - 10.5 K/uL 5.0 6.5 4.3  Hemoglobin 12.0 - 15.0 g/dL 11.3(L) 11.2(L) 10.7(L)  Hematocrit 36.0 - 46.0 % 33.7(L) 33.1(L) 32.5(L)  Platelets 150 - 400 K/uL 276 275 274    . CMP Latest Ref Rng & Units 01/06/2017 07/05/2016 04/04/2016  Glucose 65 - 99 mg/dL 86 89 97  BUN 6 - 20 mg/dL 13 13 16   Creatinine 0.44 - 1.00 mg/dL 0.92 0.89 0.97  Sodium 135 - 145 mmol/L 138 138 138  Potassium 3.5 - 5.1 mmol/L 4.0 3.9 4.0  Chloride 101 - 111 mmol/L 102 103 104  CO2 22 - 32 mmol/L 28 28 30   Calcium 8.9 - 10.3 mg/dL 9.3 9.0 9.0  Total Protein 6.5 - 8.1 g/dL 7.0 7.2 7.0  Total Bilirubin 0.3 - 1.2 mg/dL 0.5 0.5 0.5  Alkaline Phos 38 - 126 U/L 75 73 80  AST 15 - 41 U/L 28 28 27   ALT 14 - 54 U/L 28 25 29    . Lab Results  Component Value Date   CA125 31.7 01/06/2017     RADIOGRAPHIC STUDIES: I have personally reviewed the radiological images as listed and agreed with the findings in the report. No results found.  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    Plan - patient has no overt symptoms of Ovarian  cancer recurrence/progression at this -her CA 125 levels have nearly doubled in the last 3 months but has still within the normal range. -we will plan to she her back in 2 months with CT chest, abd.pelvis and with rpt labs including rpt CA 125 levels.  2) Anemia - improved/stable. -monitor  3) Knee arthritis with chronic pain -prn tramdol - refill given.  RTC 2 months with labs and CT chest/abd/pelvis   I spent 20 minutes counseling the patient face to face. The total time spent in the appointment was 25 minutes and more than 50% was on counseling and direct patient cares.    Sullivan Lone MD [place] AAHIVMS [facility] Hematology/Oncology Physician [facility]  (Office):       [phone] (Work cell):  [phone] (Fax):           [phone]

## 2017-03-03 ENCOUNTER — Encounter (HOSPITAL_COMMUNITY): Payer: Medicare Other | Attending: Hematology

## 2017-03-03 ENCOUNTER — Encounter (HOSPITAL_COMMUNITY): Payer: Self-pay

## 2017-03-03 DIAGNOSIS — C569 Malignant neoplasm of unspecified ovary: Secondary | ICD-10-CM | POA: Insufficient documentation

## 2017-03-03 DIAGNOSIS — D72829 Elevated white blood cell count, unspecified: Secondary | ICD-10-CM | POA: Insufficient documentation

## 2017-03-03 DIAGNOSIS — E559 Vitamin D deficiency, unspecified: Secondary | ICD-10-CM

## 2017-03-03 DIAGNOSIS — R180 Malignant ascites: Secondary | ICD-10-CM | POA: Insufficient documentation

## 2017-03-03 DIAGNOSIS — C562 Malignant neoplasm of left ovary: Secondary | ICD-10-CM

## 2017-03-03 LAB — CBC WITH DIFFERENTIAL/PLATELET
Basophils Absolute: 0 10*3/uL (ref 0.0–0.1)
Basophils Relative: 1 %
Eosinophils Absolute: 0.2 10*3/uL (ref 0.0–0.7)
Eosinophils Relative: 5 %
HEMATOCRIT: 32.7 % — AB (ref 36.0–46.0)
HEMOGLOBIN: 10.9 g/dL — AB (ref 12.0–15.0)
LYMPHS ABS: 1.5 10*3/uL (ref 0.7–4.0)
Lymphocytes Relative: 31 %
MCH: 34.3 pg — AB (ref 26.0–34.0)
MCHC: 33.3 g/dL (ref 30.0–36.0)
MCV: 102.8 fL — ABNORMAL HIGH (ref 78.0–100.0)
MONO ABS: 0.7 10*3/uL (ref 0.1–1.0)
MONOS PCT: 14 %
NEUTROS ABS: 2.4 10*3/uL (ref 1.7–7.7)
NEUTROS PCT: 49 %
Platelets: 252 10*3/uL (ref 150–400)
RBC: 3.18 MIL/uL — ABNORMAL LOW (ref 3.87–5.11)
RDW: 13.2 % (ref 11.5–15.5)
WBC: 4.8 10*3/uL (ref 4.0–10.5)

## 2017-03-03 LAB — COMPREHENSIVE METABOLIC PANEL
ALBUMIN: 4.2 g/dL (ref 3.5–5.0)
ALK PHOS: 73 U/L (ref 38–126)
ALT: 31 U/L (ref 14–54)
ANION GAP: 7 (ref 5–15)
AST: 30 U/L (ref 15–41)
BILIRUBIN TOTAL: 0.7 mg/dL (ref 0.3–1.2)
BUN: 14 mg/dL (ref 6–20)
CALCIUM: 9.1 mg/dL (ref 8.9–10.3)
CO2: 26 mmol/L (ref 22–32)
Chloride: 106 mmol/L (ref 101–111)
Creatinine, Ser: 0.99 mg/dL (ref 0.44–1.00)
GFR calc Af Amer: >60 mL/min (ref >60)
GFR calc non Af Amer: 55 mL/min — ABNORMAL LOW (ref >60)
GLUCOSE: 91 mg/dL (ref 65–99)
Potassium: 3.8 mmol/L (ref 3.5–5.1)
SODIUM: 139 mmol/L (ref 135–145)
Total Protein: 7.1 g/dL (ref 6.5–8.1)

## 2017-03-03 LAB — FERRITIN: Ferritin: 1338 ng/mL — ABNORMAL HIGH (ref 11–307)

## 2017-03-03 MED ORDER — SODIUM CHLORIDE 0.9% FLUSH
20.0000 mL | INTRAVENOUS | Status: DC | PRN
  Administered 2017-03-03: 20 mL via INTRAVENOUS
  Filled 2017-03-03: qty 20

## 2017-03-03 MED ORDER — HEPARIN SOD (PORK) LOCK FLUSH 100 UNIT/ML IV SOLN
500.0000 [IU] | Freq: Once | INTRAVENOUS | Status: AC
  Administered 2017-03-03: 500 [IU] via INTRAVENOUS

## 2017-03-03 NOTE — Progress Notes (Signed)
Aroura Westgate tolerated port lab draw well without complaints or incident. Port accessed with 20 gauge needle with blood drawn for labs ordered then flushed with 20 ml NS and 5 ml Heparin easily per protocol. VSS Pt reminded to pick up contrast for CT in radiology depart. Pt discharged self ambulatory in satisfactory condition accompanied by family member

## 2017-03-03 NOTE — Patient Instructions (Signed)
[place] at [facility] Discharge Instructions  RECOMMENDATIONS MADE BY THE CONSULTANT AND ANY TEST RESULTS WILL BE SENT TO YOUR REFERRING PHYSICIAN.  Labs drawn from portacath then flushed per protocol. Follow-up as scheduled. Call clinic for any questions or concerns  Thank you for choosing [place] at [facility] to provide your oncology and hematology care.  To afford each patient quality time with our provider, please arrive at least 15 minutes before your scheduled appointment time.    If you have a lab appointment with the [facility] please come in thru the  Main Entrance and check in at the main information desk  You need to re-schedule your appointment should you arrive 10 or more minutes late.  We strive to give you quality time with our providers, and arriving late affects you and other patients whose appointments are after yours.  Also, if you no show three or more times for appointments you may be dismissed from the clinic at the providers discretion.     Again, thank you for choosing [facility].  Our hope is that these requests will decrease the amount of time that you wait before being seen by our physicians.       _____________________________________________________________  Should you have questions after your visit to [facility], please contact our office at [phone] between the hours of 8:30 a.m. and 4:30 p.m.  Voicemails left after 4:30 p.m. will not be returned until the following business day.  For prescription refill requests, have your pharmacy contact our office.       Resources For Cancer Patients and their Caregivers ? American Cancer Society: Can assist with transportation, wigs, general needs, runs Look Good Feel Better.        803 142 9871 ? Cancer Care: Provides financial assistance, online support groups, medication/co-pay assistance.  1-800-813-HOPE 406-828-7809) ? [place] Assists [place] cancer patients and their families through emotional , educational and financial support.  (716)132-3729 ? [place] DSS Where to apply for food stamps, Medicaid and utility assistance. 504-340-7281 ? [employer]: Transportation to medical appointments. 907-532-0573 ? Social Security Administration: May apply for disability if have a Stage IV cancer. 209-114-0006 630-204-9200 ? [employer], Disability and Transit Services: Assists with nutrition, care and transit needs. [place] Support Programs: @10RELATIVEDAYS @ > Cancer Support Group  2nd Tuesday of the month 1pm-2pm, Journey Room  > Creative Journey  3rd Tuesday of the month 1130am-1pm, Journey Room  > Look Good Feel Better  1st Wednesday of the month 10am-12 noon, Journey Room (Call [place] to register [phone])

## 2017-03-04 LAB — VITAMIN D 25 HYDROXY (VIT D DEFICIENCY, FRACTURES): Vit D, 25-Hydroxy: 34.1 ng/mL (ref 30.0–100.0)

## 2017-03-04 LAB — CA 125: CA 125: 47.3 U/mL — ABNORMAL HIGH (ref 0.0–38.1)

## 2017-03-07 ENCOUNTER — Encounter (HOSPITAL_COMMUNITY): Payer: Self-pay

## 2017-03-07 ENCOUNTER — Ambulatory Visit (HOSPITAL_COMMUNITY)
Admission: RE | Admit: 2017-03-07 | Discharge: 2017-03-07 | Disposition: A | Discharge status: Home / Self Care | Payer: Medicare Other | Source: Ambulatory Visit | Attending: Hematology | Admitting: Hematology

## 2017-03-07 DIAGNOSIS — C562 Malignant neoplasm of left ovary: Secondary | ICD-10-CM | POA: Insufficient documentation

## 2017-03-07 DIAGNOSIS — Z90710 Acquired absence of both cervix and uterus: Secondary | ICD-10-CM | POA: Insufficient documentation

## 2017-03-07 MED ORDER — IOPAMIDOL (ISOVUE-300) INJECTION 61%
100.0000 mL | Freq: Once | INTRAVENOUS | Status: AC | PRN
  Administered 2017-03-07: 100 mL via INTRAVENOUS

## 2017-03-10 ENCOUNTER — Encounter (HOSPITAL_BASED_OUTPATIENT_CLINIC_OR_DEPARTMENT_OTHER): Payer: Medicare Other | Admitting: Hematology

## 2017-03-10 ENCOUNTER — Encounter (HOSPITAL_COMMUNITY): Payer: Self-pay | Admitting: Hematology

## 2017-03-10 VITALS — BP 161/85 | HR 76 | Resp 16 | Ht 66.0 in | Wt 151.0 lb

## 2017-03-10 DIAGNOSIS — Z8543 Personal history of malignant neoplasm of ovary: Secondary | ICD-10-CM

## 2017-03-10 DIAGNOSIS — D649 Anemia, unspecified: Secondary | ICD-10-CM

## 2017-03-10 DIAGNOSIS — C562 Malignant neoplasm of left ovary: Secondary | ICD-10-CM

## 2017-03-10 NOTE — Patient Instructions (Addendum)
[place] at [facility] Discharge Instructions  RECOMMENDATIONS MADE BY THE CONSULTANT AND ANY TEST RESULTS WILL BE SENT TO YOUR REFERRING PHYSICIAN.  You were seen today by Dr. Irene Limbo. Refer you back to Dr. Denman George. Return in 2 months for follow up.   Thank you for choosing [place] at [facility] to provide your oncology and hematology care.  To afford each patient quality time with our provider, please arrive at least 15 minutes before your scheduled appointment time.    If you have a lab appointment with the [place] please come in thru the  Main Entrance and check in at the main information desk  You need to re-schedule your appointment should you arrive 10 or more minutes late.  We strive to give you quality time with our providers, and arriving late affects you and other patients whose appointments are after yours.  Also, if you no show three or more times for appointments you may be dismissed from the clinic at the providers discretion.     Again, thank you for choosing [facility].  Our hope is that these requests will decrease the amount of time that you wait before being seen by our physicians.       _____________________________________________________________  Should you have questions after your visit to [facility], please contact our office at [phone] between the hours of 8:30 a.m. and 4:30 p.m.  Voicemails left after 4:30 p.m. will not be returned until the following business day.  For prescription refill requests, have your pharmacy contact our office.       Resources For Cancer Patients and their Caregivers ? American Cancer Society: Can assist with transportation, wigs, general needs, runs Look Good Feel Better.        6024818479 ? Cancer Care: Provides financial assistance, online support groups, medication/co-pay assistance.  1-800-813-HOPE 367-435-1423) ? [place] Assists [place] cancer patients and their families through emotional , educational and financial support.  (684) 019-9549 ? [place] DSS Where to apply for food stamps, Medicaid and utility assistance. (203) 447-2299 ? [employer]: Transportation to medical appointments. 907-142-7472 ? Social Security Administration: May apply for disability if have a Stage IV cancer. 517-826-2672 8571340472 ? [employer], Disability and Transit Services: Assists with nutrition, care and transit needs. [place] Support Programs: @10RELATIVEDAYS @ > Cancer Support Group  2nd Tuesday of the month 1pm-2pm, Journey Room  > Creative Journey  3rd Tuesday of the month 1130am-1pm, Journey Room  > Look Good Feel Better  1st Wednesday of the month 10am-12 noon, Journey Room (Call [place] to register [phone])

## 2017-03-12 NOTE — Progress Notes (Signed)
Latasha [name]  HEMATOLOGY ONCOLOGY PROGRESS NOTE  Date of service: .03/10/2017  Patient Care Team: Patient, No Pcp Per as PCP - General (General Practice)   CC: f/u for Ovarian Cancer  SUMMARY OF ONCOLOGIC HISTORY:   Ovarian cancer ([place])   04/25/2015 - 04/28/2015 Hospital Admission    Nausea/diarrhea.  Oncology consult completed on 04/27/2015.      04/25/2015 Tumor Marker    CA 125- 3902.      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      04/27/2015 Procedure    US Paracentesis- A total of approximately 3700 mL of amber colored fluid was removed. A fluid sample was sent for laboratory analysis.      04/27/2015 Imaging    CT Chest- Mild left supraclavicular lymphadenopathy and moderate mediastinal lymphadenopathy involving the prevascular, subcarinal and bilateral pericardiophrenic nodal chains, likely metastatic.      04/27/2015 Pathology Results    Diagnosis PERITONEAL/ASCITIC FLUID(SPECIMEN 1 OF 1 COLLECTED 04/27/15): MALIGNANT CELLS CONSISTENT WITH METASTATIC ADENOCARCINOMA.  The immunophenotype is most consistent with a gynecologic primary, most likely ovary.      05/08/2015 Miscellaneous    Seen by Dr. Denman George- recommending Carboplatin/Paclitaxel x 3 cycles and return visit to see her (within 1 week of administration of third cycle) to evaluate for optimal sequencing of treatment modalities.      05/13/2015 - 05/15/2015 Hospital Admission    Hospitalized for AKI      05/19/2015 - 07/01/2015 Chemotherapy    Carboplatin/Paclitaxel x 3 cycles      07/28/2015 Surgery    Exploratory laparotomy with total abdominal hysterectomy, bilateral salpingo-oophorectomy, omentectomy radical tumor debulking for ovarian cancer; by Dr. Everitt Amber.      07/28/2015 Pathology Results    Uterus +/- tubes/ovaries, neoplastic, cervix - HIGH GRADE SEROUS  CARCINOMA INVOLVING LEFT OVARY, LEFT FALLOPIAN TUBE AND RIGHT FALLOPIAN TUBE. - CERVIX, ENDOMETRIUM AND MYOMETRIUM ARE FREE OF TUMOR. 2. Cul-de-sac biop...      08/26/2015 -  Chemotherapy    Carboplatin/Paclitaxel x 3 cycles      12/23/2015 Miscellaneous    Genetic counseling.  Genetic testing was normal, and did not reveal a deleterious mutation in these genes       INTERVAL HISTORY:  Patient is here for her scheduled followup for Ovarian cancer after her last clinic visit on 01/06/2017. No acute new symptoms. No abd pain. No change in Bowel habits. Eating well. No weight loss. Her CA 125 level were increasing and so a ct chest/abd/pelvis was done recently on 6/19 which showed Interval increase in size of solitary nodular peritoneal implant in the LEFT upper quadrant . No additional evidence of peritoneal nodularity.No ascites.No evidence of metastatic disease in thorax.   REVIEW OF SYSTEMS:    10 Point review of systems of done and is negative except as noted above.  . Past Medical History:  Diagnosis Date  . Arthritis   . B12 deficiency   . Cataract   . History of blood transfusion   . History of chemotherapy   . Hypertension   . Iron deficiency anemia   . Mixed hyperlipidemia   . Ovarian cancer ([place])   . 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 Schizophrenia ([place])   . Ulcerative colitis ([place])   . Vitamin D deficiency     . Past Surgical History:  Procedure Laterality  Date  . ABDOMINAL HYSTERECTOMY N/A 07/28/2015   Procedure: TOTAL HYSTERECTOMY ABDOMINAL BILATERAL SALPINGO OOPHRORECTOMY;  Surgeon: Everitt Amber, MD;  Location: [facility];  Service: Gynecology;  Laterality: N/A;  . DEBULKING N/A 07/28/2015   Procedure: DEBULKING;  Surgeon: Everitt Amber, MD;  Location: [facility];  Service: Gynecology;  Laterality: N/A;  . LAPAROTOMY N/A 07/28/2015   Procedure: EXPLORATORY LAPAROTOMY;  Surgeon: Everitt Amber, MD;  Location: [facility];  Service: Gynecology;  Laterality: N/A;  . OMENTECTOMY N/A 07/28/2015    Procedure: OMENTECTOMY ;  Surgeon: Everitt Amber, MD;  Location: [facility];  Service: Gynecology;  Laterality: N/A;  . PARACENTESIS  04/27/15  . PORTACATH PLACEMENT Right 04/2015  . REPLACEMENT TOTAL KNEE     right knee in 2003    . Social History  Substance Use Topics  . Smoking status: Never Smoker  . Smokeless tobacco: Never Used  . Alcohol use No    ALLERGIES:  has No Known Allergies.  MEDICATIONS:  Current Outpatient Prescriptions  Medication Sig Dispense Refill  . acetaminophen (TYLENOL) 500 MG tablet Take 2 tablets (1,000 mg total) by mouth every 12 (twelve) hours. 30 tablet 0  . amLODipine (NORVASC) 10 MG tablet Take 10 mg by mouth every evening.     Latasha [name] aspirin EC 81 MG tablet Take 81 mg by mouth daily.    . Cholecalciferol (VITAMIN D-3) 1000 UNITS CAPS Take 1,000 Units by mouth daily.     Latasha [name] docusate sodium (COLACE) 100 MG capsule Take 100 mg by mouth 2 (two) times daily.    . folic acid (FOLVITE) 1 MG tablet Take 1 mg by mouth daily.    Latasha [name] lovastatin (MEVACOR) 10 MG tablet Take 10 mg by mouth at bedtime.    . metoprolol tartrate (LOPRESSOR) 25 MG tablet Take 1 tablet (25 mg total) by mouth 2 (two) times daily. 60 tablet 6  . mirtazapine (REMERON) 15 MG tablet Take 7.5 mg by mouth at bedtime.    . Multiple Vitamin (MULTIVITAMIN WITH MINERALS) TABS tablet Take 1 tablet by mouth daily.    . ondansetron (ZOFRAN) 4 MG tablet Take 1 tablet (4 mg total) by mouth every 6 (six) hours as needed for nausea. 20 tablet 0  . QUEtiapine (SEROQUEL XR) 300 MG 24 hr tablet Take 300 mg by mouth at bedtime.    . sulfaSALAzine (AZULFIDINE) 500 MG tablet Take 500 mg by mouth 3 (three) times daily.    . traMADol (ULTRAM) 50 MG tablet Take 1 tablet (50 mg total) by mouth every 6 (six) hours as needed. 90 tablet 1  . vitamin B-12 (CYANOCOBALAMIN) 1000 MCG tablet Take 1,000 mcg by mouth daily.     No current facility-administered medications for this visit.     PHYSICAL EXAMINATION: ECOG PERFORMANCE  STATUS: 1 - Symptomatic but completely ambulatory  . Vitals:   03/10/17 1423  BP: (!) 161/85  Pulse: 76  Resp: 16    Filed Weights   03/10/17 1423  Weight: 151 lb (68.5 kg)   .Body mass index is 24.37 kg/m.  GENERAL:alert, in no acute distress and comfortable SKIN: no acute rashes, no significant lesions EYES: conjunctiva are pink and non-injected, sclera anicteric OROPHARYNX: MMM, no exudates, no oropharyngeal erythema or ulceration NECK: supple, no JVD LYMPH:  no palpable lymphadenopathy in the cervical, axillary or inguinal regions LUNGS: clear to auscultation b/l with normal respiratory effort HEART: regular rate & rhythm ABDOMEN:  normoactive bowel sounds , non tender, not distended. Extremity: no pedal edema PSYCH:  alert & oriented x 3 with fluent speech NEURO: no focal motor/sensory deficits  LABORATORY DATA:   I have reviewed the data as listed  . CBC Latest Ref Rng & Units 03/03/2017 01/06/2017 10/05/2016  WBC 4.0 - 10.5 K/uL 4.8 5.0 6.5  Hemoglobin 12.0 - 15.0 g/dL 10.9(L) 11.3(L) 11.2(L)  Hematocrit 36.0 - 46.0 % 32.7(L) 33.7(L) 33.1(L)  Platelets 150 - 400 K/uL 252 276 275   . CBC    Component Value Date/Time   WBC 4.8 03/03/2017 1008   RBC 3.18 (L) 03/03/2017 1008   HGB 10.9 (L) 03/03/2017 1008   HCT 32.7 (L) 03/03/2017 1008   PLT 252 03/03/2017 1008   MCV 102.8 (H) 03/03/2017 1008   MCH 34.3 (H) 03/03/2017 1008   MCHC 33.3 03/03/2017 1008   RDW 13.2 03/03/2017 1008   LYMPHSABS 1.5 03/03/2017 1008   MONOABS 0.7 03/03/2017 1008   EOSABS 0.2 03/03/2017 1008   BASOSABS 0.0 03/03/2017 1008    CMP Latest Ref Rng & Units 03/03/2017 01/06/2017 07/05/2016  Glucose 65 - 99 mg/dL 91 86 89  BUN 6 - 20 mg/dL 14 13 13   Creatinine 0.44 - 1.00 mg/dL 0.99 0.92 0.89  Sodium 135 - 145 mmol/L 139 138 138  Potassium 3.5 - 5.1 mmol/L 3.8 4.0 3.9  Chloride 101 - 111 mmol/L 106 102 103  CO2 22 - 32 mmol/L 26 28 28   Calcium 8.9 - 10.3 mg/dL 9.1 9.3 9.0  Total  Protein 6.5 - 8.1 g/dL 7.1 7.0 7.2  Total Bilirubin 0.3 - 1.2 mg/dL 0.7 0.5 0.5  Alkaline Phos 38 - 126 U/L 73 75 73  AST 15 - 41 U/L 30 28 28   ALT 14 - 54 U/L 31 28 25    . Lab Results  Component Value Date   CA125 47.3 (H) 03/03/2017     RADIOGRAPHIC STUDIES: I have personally reviewed the radiological images as listed and agreed with the findings in the report. 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 Elevated tumor marker recently. History of omentectomy and debulking. EXAM: CT CHEST, ABDOMEN, AND PELVIS WITH CONTRAST TECHNIQUE: Multidetector CT imaging of the chest, abdomen and pelvis was performed following the standard protocol during bolus administration of intravenous contrast. CONTRAST:  159m ISOVUE-300 IOPAMIDOL (ISOVUE-300) INJECTION 61% COMPARISON:  CT 07/20/2015 FINDINGS: CT CHEST FINDINGS Cardiovascular: No significant vascular findings. Normal heart size. No pericardial effusion. Prior Mediastinum/Nodes: Port in the RIGHT chest wall. No axillary or supraclavicular adenopathy. No mediastinal hilar adenopathy. No pericardial fluid. Lungs/Pleura: No suspicious pulmonary nodules.  No pleural fluid. Musculoskeletal: No aggressive osseous lesion. CT ABDOMEN AND PELVIS FINDINGS Hepatobiliary: Focal hepatic lesion. Gallbladder normal. No duct dilatation. Pancreas: Pancreas is normal. No ductal dilatation. No pancreatic inflammation. Spleen: Normal spleen Adrenals/urinary tract: Adrenal glands and kidneys are normal. The ureters and bladder normal. Stomach/Bowel: Stomach, small bowel, appendix, and cecum are normal. The colon and rectosigmoid colon are normal. Vascular/Lymphatic: Abdominal aortic normal caliber. No retroperitoneal adenopathy. Periportal adenopathy. Rounded inguinal lymph nodes are not changed. Reproductive: Post hysterectomy Other: Peritoneal nodule in the gastrosplenic ligament  measures 18 mm x 16 mm (image 61, series 2) increased from 10 mm x 7 mm. No additional peritoneal nodularity is clearly identified. No omental nodularity. No retroperitoneal nodularity. No free fluid in the abdomen pelvis. Musculoskeletal: No aggressive osseous lesion. IMPRESSION: Chest Impression: No evidence of metastatic disease in thorax. Abdomen / Pelvis Impression: 1. Interval increase in  size of solitary nodular peritoneal implant in the LEFT upper quadrant . 2. No additional evidence of peritoneal nodularity. 3. No ascites. Electronically Signed   By: Suzy Bouchard M.D.   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 Elevated tumor marker recently. History of omentectomy and debulking. EXAM: CT CHEST, ABDOMEN, AND PELVIS WITH CONTRAST TECHNIQUE: Multidetector CT imaging of the chest, abdomen and pelvis was performed following the standard protocol during bolus administration of intravenous contrast. CONTRAST:  148m ISOVUE-300 IOPAMIDOL (ISOVUE-300) INJECTION 61% COMPARISON:  CT 07/20/2015 FINDINGS: CT CHEST FINDINGS Cardiovascular: No significant vascular findings. Normal heart size. No pericardial effusion. Prior Mediastinum/Nodes: Port in the RIGHT chest wall. No axillary or supraclavicular adenopathy. No mediastinal hilar adenopathy. No pericardial fluid. Lungs/Pleura: No suspicious pulmonary nodules.  No pleural fluid. Musculoskeletal: No aggressive osseous lesion. CT ABDOMEN AND PELVIS FINDINGS Hepatobiliary: Focal hepatic lesion. Gallbladder normal. No duct dilatation. Pancreas: Pancreas is normal. No ductal dilatation. No pancreatic inflammation. Spleen: Normal spleen Adrenals/urinary tract: Adrenal glands and kidneys are normal. The ureters and bladder normal. Stomach/Bowel: Stomach, small bowel, appendix, and cecum are normal. The colon and rectosigmoid colon are normal.  Vascular/Lymphatic: Abdominal aortic normal caliber. No retroperitoneal adenopathy. Periportal adenopathy. Rounded inguinal lymph nodes are not changed. Reproductive: Post hysterectomy Other: Peritoneal nodule in the gastrosplenic ligament measures 18 mm x 16 mm (image 61, series 2) increased from 10 mm x 7 mm. No additional peritoneal nodularity is clearly identified. No omental nodularity. No retroperitoneal nodularity. No free fluid in the abdomen pelvis. Musculoskeletal: No aggressive osseous lesion. IMPRESSION: Chest Impression: No evidence of metastatic disease in thorax. Abdomen / Pelvis Impression: 1. Interval increase in size of solitary nodular peritoneal implant in the LEFT upper quadrant . 2. No additional evidence of peritoneal nodularity. 3. No ascites. Electronically Signed   By: SSuzy BouchardM.D.   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     patient has no overt symptoms of Ovarian cancer recurrence/progression at this time. However her CA 125 and CT C/A/P show limited progression of her Ovarian CA in a single peritoneal nodule that is currently asymptomatic. PLAN - we discussed that therapeutic options would include continue observation prior to additional palliative treatment vs EBRT (preferred) vs surgical resection to achieve NED status.  Referred for followup with Dr EEveritt Amberfor Gyn Onc f/u Refer to Dr KSondra Come(Radiation Oncology) for consideration of EBRT to isolated peritoneal recurrence. RTC with MD/APP in 2 months with labs after above input  2) Anemia - improved/stable. -monitor   RTC 2 months with labs    I spent 20 minutes counseling the patient face to face. The total time spent in the appointment was 25 minutes and more than 50% was on counseling and direct patient cares.    GSullivan LoneMD MSangerAAHIVMS SAnmed Health North Women'S And Children'S HospitalCSynergy Spine And Orthopedic Surgery Center LLCHematology/Oncology Physician CBlack Hills Regional Eye Surgery Center LLC (Office):       [phone](Work cell):  [phone](Fax):           [phone]

## 2017-03-13 ENCOUNTER — Encounter: Payer: Self-pay | Admitting: Radiation Oncology

## 2017-03-16 NOTE — Progress Notes (Signed)
Histology and Location of Primary Cancer: ovarian cancer  Location(s) of Symptomatic tumor(s): solitary nodular peritoneal implant in the LEFT upper quadrant which was seen on CT on 03/07/17 and rise in CA 125.  Past/Anticipated chemotherapy by medical oncology, if any: Carboplatin/Paclitaxel x 3 cycles finished in 10/16.  Pain on a scale of 0-10 is: 0   SAFETY ISSUES:  Prior radiation? no  Pacemaker/ICD? no  Possible current pregnancy? no  Is the patient on methotrexate? no  Additional Complaints / other details:  Patient is here with her son.  BP 140/76 (BP Location: Right Arm, Patient Position: Sitting)   Pulse 60   Temp 97.9 F (36.6 C)   Ht 5' 6"  (1.676 m)   Wt 155 lb (70.3 kg)   SpO2 99%   BMI 25.02 kg/m    Wt Readings from Last 3 Encounters:  03/29/17 155 lb (70.3 kg)  03/10/17 151 lb (68.5 kg)  10/05/16 148 lb 11.2 oz (67.4 kg)

## 2017-03-29 ENCOUNTER — Ambulatory Visit
Admission: RE | Admit: 2017-03-29 | Discharge: 2017-03-29 | Disposition: A | Discharge status: Home / Self Care | Payer: Medicare Other | Source: Ambulatory Visit | Attending: Radiation Oncology | Admitting: Radiation Oncology

## 2017-03-29 ENCOUNTER — Encounter: Payer: Self-pay | Admitting: Radiation Oncology

## 2017-03-29 VITALS — BP 140/76 | HR 60 | Temp 97.9°F | Ht 66.0 in | Wt 155.0 lb

## 2017-03-29 DIAGNOSIS — Z923 Personal history of irradiation: Secondary | ICD-10-CM | POA: Diagnosis not present

## 2017-03-29 DIAGNOSIS — C569 Malignant neoplasm of unspecified ovary: Secondary | ICD-10-CM

## 2017-03-29 DIAGNOSIS — Z8543 Personal history of malignant neoplasm of ovary: Secondary | ICD-10-CM | POA: Insufficient documentation

## 2017-03-29 DIAGNOSIS — Z08 Encounter for follow-up examination after completed treatment for malignant neoplasm: Secondary | ICD-10-CM | POA: Insufficient documentation

## 2017-03-29 NOTE — Progress Notes (Signed)
Please see the Nurse Progress Note in the MD Initial Consult Encounter for this patient. 

## 2017-03-29 NOTE — Progress Notes (Signed)
Radiation Oncology         [phone]) [phone] ________________________________  Initial Outpatient Consultation  Name: Latasha [name] MRN: [id number]  Date: 03/29/2017  DOB: 22-Jul-1941  [id number], No Pcp Per  Brunetta Genera, MD   REFERRING PHYSICIAN: Brunetta Genera, MD  DIAGNOSIS: Stage IV ovarian cancer, status post chemotherapy in 2016  HISTORY OF PRESENT ILLNESS::Latasha [name] is a 76 y.o. female with a prior history of ovarian cancer. The patient previously underwent 3 cycles of chemotherapy with Carboplatin/Paclitaxel, concluding in 06/2015, and then the patient proceeded with surgery under the direction of Dr. Denman George. She received 3 additional cycles of carboplatinum/paclitaxel.  She was following regularly with Medical Oncology when her CA 125 level was noted to be rising, prompting a CT CAP.  CT CAP performed on 03/07/17 showed interval increase in size of solitary nodular peritoneal implant in the left upper quadrant. No additional evidence of peritoneal nodularity, and no ascites. No evidence of metastatic disease in the thorax.   The patient presents to the clinic today to discuss the role that radiation therapy may play in the treatment of her disease. She is accompanied today by her son.  On review of systems, the patient denies abdominal pain. She denies vaginal or rectal bleeding. Overall the patient is without complaint.  No appetite issues.    Ovarian cancer ([place])   04/25/2015 - 04/28/2015 Hospital Admission    Nausea/diarrhea.  Oncology consult completed on 04/27/2015.      04/25/2015 Tumor Marker    CA 125- 3902.      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      04/27/2015 Procedure    US Paracentesis- A total of approximately 3700 mL of amber colored fluid was removed. A fluid sample was sent for  laboratory analysis.      04/27/2015 Imaging    CT Chest- Mild left supraclavicular lymphadenopathy and moderate mediastinal lymphadenopathy involving the prevascular, subcarinal and bilateral pericardiophrenic nodal chains, likely metastatic.      04/27/2015 Pathology Results    Diagnosis PERITONEAL/ASCITIC FLUID(SPECIMEN 1 OF 1 COLLECTED 04/27/15): MALIGNANT CELLS CONSISTENT WITH METASTATIC ADENOCARCINOMA.  The immunophenotype is most consistent with a gynecologic primary, most likely ovary.      05/08/2015 Miscellaneous    Seen by Dr. Denman George- recommending Carboplatin/Paclitaxel x 3 cycles and return visit to see her (within 1 week of administration of third cycle) to evaluate for optimal sequencing of treatment modalities.      05/13/2015 - 05/15/2015 Hospital Admission    Hospitalized for AKI      05/19/2015 - 07/01/2015 Chemotherapy    Carboplatin/Paclitaxel x 3 cycles      07/28/2015 Surgery    Exploratory laparotomy with total abdominal hysterectomy, bilateral salpingo-oophorectomy, omentectomy radical tumor debulking for ovarian cancer; by Dr. Everitt Amber.      07/28/2015 Pathology Results    Uterus +/- tubes/ovaries, neoplastic, cervix - HIGH GRADE SEROUS CARCINOMA INVOLVING LEFT OVARY, LEFT FALLOPIAN TUBE AND RIGHT FALLOPIAN TUBE. - CERVIX, ENDOMETRIUM AND MYOMETRIUM ARE FREE OF TUMOR. 2. Cul-de-sac biop...      08/26/2015 -  Chemotherapy    Carboplatin/Paclitaxel x 3 cycles      12/23/2015 Miscellaneous    Genetic counseling.  Genetic testing was normal, and did not reveal a deleterious mutation in these genes      PREVIOUS RADIATION THERAPY: No  PAST MEDICAL HISTORY:  has a past medical history of Arthritis; B12 deficiency; Cataract; History of blood transfusion; History of chemotherapy; Hypertension; Iron deficiency anemia; Mixed hyperlipidemia; Ovarian cancer ([place]); Regional enteritis [facility]); Schizophrenia ([place]); Ulcerative colitis ([place]); and Vitamin D deficiency.    PAST  SURGICAL HISTORY: Past Surgical History:  Procedure Laterality Date  . ABDOMINAL HYSTERECTOMY N/A 07/28/2015   Procedure: TOTAL HYSTERECTOMY ABDOMINAL BILATERAL SALPINGO OOPHRORECTOMY;  Surgeon: Everitt Amber, MD;  Location: [facility];  Service: Gynecology;  Laterality: N/A;  . DEBULKING N/A 07/28/2015   Procedure: DEBULKING;  Surgeon: Everitt Amber, MD;  Location: [facility];  Service: Gynecology;  Laterality: N/A;  . LAPAROTOMY N/A 07/28/2015   Procedure: EXPLORATORY LAPAROTOMY;  Surgeon: Everitt Amber, MD;  Location: [facility];  Service: Gynecology;  Laterality: N/A;  . OMENTECTOMY N/A 07/28/2015   Procedure: OMENTECTOMY ;  Surgeon: Everitt Amber, MD;  Location: [facility];  Service: Gynecology;  Laterality: N/A;  . PARACENTESIS  04/27/15  . PORTACATH PLACEMENT Right 04/2015  . REPLACEMENT TOTAL KNEE     right knee in 2003    FAMILY HISTORY: family history includes Cancer in her paternal uncle; Congestive Heart Failure in her mother; Hypertension in her maternal grandfather; Lung cancer in her maternal grandmother; Prostate cancer in her brother; Seizures in her sister.  SOCIAL HISTORY:  reports that she has never smoked. She has never used smokeless tobacco. She reports that she does not drink alcohol or use drugs. The patient lives in [place], [place], approximately 2 hours away from this clinic. The patient enjoys gardening at home.  ALLERGIES: Patient has no known allergies.  MEDICATIONS:  Current Outpatient Prescriptions  Medication Sig Dispense Refill  . acetaminophen (TYLENOL) 500 MG tablet Take 2 tablets (1,000 mg total) by mouth every 12 (twelve) hours. 30 tablet 0  . amLODipine (NORVASC) 10 MG tablet Take 10 mg by mouth every evening.     Marland Kitchen aspirin EC 81 MG tablet Take 81 mg by mouth daily.    . Cholecalciferol (VITAMIN D-3) 1000 UNITS CAPS Take 1,000 Units by mouth daily.     Marland Kitchen docusate sodium (COLACE) 100 MG capsule Take 100 mg by mouth 2 (two) times daily.    . folic acid (FOLVITE) 1 MG tablet Take 1 mg by  mouth daily.    Marland Kitchen lovastatin (MEVACOR) 10 MG tablet Take 10 mg by mouth at bedtime.    . metoprolol tartrate (LOPRESSOR) 25 MG tablet Take 1 tablet (25 mg total) by mouth 2 (two) times daily. 60 tablet 6  . mirtazapine (REMERON) 15 MG tablet Take 7.5 mg by mouth at bedtime.    . Multiple Vitamin (MULTIVITAMIN WITH MINERALS) TABS tablet Take 1 tablet by mouth daily.    . QUEtiapine (SEROQUEL XR) 300 MG 24 hr tablet Take 300 mg by mouth at bedtime.    . sulfaSALAzine (AZULFIDINE) 500 MG tablet Take 500 mg by mouth 3 (three) times daily.    . traMADol (ULTRAM) 50 MG tablet Take 1 tablet (50 mg total) by mouth every 6 (six) hours as needed. 90 tablet 1  . vitamin B-12 (CYANOCOBALAMIN) 1000 MCG tablet Take 1,000 mcg by mouth daily.    . ondansetron (ZOFRAN) 4 MG tablet Take 1 tablet (4 mg total) by mouth every 6 (six) hours as needed for nausea. (Patient not taking: Reported on 03/29/2017) 20 tablet 0   No current facility-administered medications for this encounter.     REVIEW OF SYSTEMS:  A 10+ POINT REVIEW OF SYSTEMS WAS OBTAINED  including neurology, dermatology, psychiatry, cardiac, respiratory, lymph, extremities, GI, GU, musculoskeletal, constitutional, reproductive, HEENT. All pertinent positives are noted in the HPI. All others are negative.   PHYSICAL EXAM:  height is 5' 6"  (1.676 m) and weight is 155 lb (70.3 kg). Her temperature is 97.9 F (36.6 C). Her blood pressure is 140/76 and her pulse is 60. Her oxygen saturation is 99%.   General: Alert and oriented, in no acute distress. HEENT: Head is normocephalic. Extraocular movements are intact. Oropharynx is clear. Upper dentures and lower partials not removed for exam today.  Neck: Neck is supple, no palpable cervical or supraclavicular lymphadenopathy. Heart: Regular in rate and rhythm with no murmurs. Chest: Clear to auscultation bilaterally. Abdomen: Soft, non tender, non distended. She has a long vertical scar extending from the  umbilicus inferiorly. No inguinal adenopathy.  Extremities: No edema. Lymphatics: see Neck Exam Skin: No concerning lesions. Musculoskeletal: symmetric strength and muscle tone throughout. Neurologic: Cranial nerves II through XII are grossly intact. No obvious focalities. Speech is fluent. Coordination is intact. Psychiatric: Judgment and insight are intact. Affect is appropriate.  ECOG = 1  LABORATORY DATA:  Lab Results  Component Value Date   WBC 4.8 03/03/2017   HGB 10.9 (L) 03/03/2017   HCT 32.7 (L) 03/03/2017   MCV 102.8 (H) 03/03/2017   PLT 252 03/03/2017   NEUTROABS 2.4 03/03/2017   Lab Results  Component Value Date   NA 139 03/03/2017   K 3.8 03/03/2017   CL 106 03/03/2017   CO2 26 03/03/2017   GLUCOSE 91 03/03/2017   CREATININE 0.99 03/03/2017   CALCIUM 9.1 03/03/2017      RADIOGRAPHY: 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 Elevated tumor marker recently. History of omentectomy and debulking. EXAM: CT CHEST, ABDOMEN, AND PELVIS WITH CONTRAST TECHNIQUE: Multidetector CT imaging of the chest, abdomen and pelvis was performed following the standard protocol during bolus administration of intravenous contrast. CONTRAST:  146m ISOVUE-300 IOPAMIDOL (ISOVUE-300) INJECTION 61% COMPARISON:  CT 07/20/2015 FINDINGS: CT CHEST FINDINGS Cardiovascular: No significant vascular findings. Normal heart size. No pericardial effusion. Prior Mediastinum/Nodes: Port in the RIGHT chest wall. No axillary or supraclavicular adenopathy. No mediastinal hilar adenopathy. No pericardial fluid. Lungs/Pleura: No suspicious pulmonary nodules.  No pleural fluid. Musculoskeletal: No aggressive osseous lesion. CT ABDOMEN AND PELVIS FINDINGS Hepatobiliary: Focal hepatic lesion. Gallbladder normal. No duct dilatation. Pancreas: Pancreas is normal. No ductal dilatation. No pancreatic inflammation. Spleen: Normal  spleen Adrenals/urinary tract: Adrenal glands and kidneys are normal. The ureters and bladder normal. Stomach/Bowel: Stomach, small bowel, appendix, and cecum are normal. The colon and rectosigmoid colon are normal. Vascular/Lymphatic: Abdominal aortic normal caliber. No retroperitoneal adenopathy. Periportal adenopathy. Rounded inguinal lymph nodes are not changed. Reproductive: Post hysterectomy Other: Peritoneal nodule in the gastrosplenic ligament measures 18 mm x 16 mm (image 61, series 2) increased from 10 mm x 7 mm. No additional peritoneal nodularity is clearly identified. No omental nodularity. No retroperitoneal nodularity. No free fluid in the abdomen pelvis. Musculoskeletal: No aggressive osseous lesion. IMPRESSION: Chest Impression: No evidence of metastatic disease in thorax. Abdomen / Pelvis Impression: 1. Interval increase in size of solitary nodular peritoneal implant in the LEFT upper quadrant . 2. No additional evidence of peritoneal nodularity. 3. No ascites. Electronically Signed   By: SSuzy BouchardM.D.   On: 03/07/2017 15:00   Ct Abdomen Pelvis W Contrast  Result Date: 03/07/2017 CLINICAL DATA:  Stage  IV ovarian carcinoma. Malignant ascites and pulmonary metastasis. Elevated tumor marker recently. History of omentectomy and debulking. EXAM: CT CHEST, ABDOMEN, AND PELVIS WITH CONTRAST TECHNIQUE: Multidetector CT imaging of the chest, abdomen and pelvis was performed following the standard protocol during bolus administration of intravenous contrast. CONTRAST:  157m ISOVUE-300 IOPAMIDOL (ISOVUE-300) INJECTION 61% COMPARISON:  CT 07/20/2015 FINDINGS: CT CHEST FINDINGS Cardiovascular: No significant vascular findings. Normal heart size. No pericardial effusion. Prior Mediastinum/Nodes: Port in the RIGHT chest wall. No axillary or supraclavicular adenopathy. No mediastinal hilar adenopathy. No pericardial fluid. Lungs/Pleura: No suspicious pulmonary nodules.  No pleural fluid.  Musculoskeletal: No aggressive osseous lesion. CT ABDOMEN AND PELVIS FINDINGS Hepatobiliary: Focal hepatic lesion. Gallbladder normal. No duct dilatation. Pancreas: Pancreas is normal. No ductal dilatation. No pancreatic inflammation. Spleen: Normal spleen Adrenals/urinary tract: Adrenal glands and kidneys are normal. The ureters and bladder normal. Stomach/Bowel: Stomach, small bowel, appendix, and cecum are normal. The colon and rectosigmoid colon are normal. Vascular/Lymphatic: Abdominal aortic normal caliber. No retroperitoneal adenopathy. Periportal adenopathy. Rounded inguinal lymph nodes are not changed. Reproductive: Post hysterectomy Other: Peritoneal nodule in the gastrosplenic ligament measures 18 mm x 16 mm (image 61, series 2) increased from 10 mm x 7 mm. No additional peritoneal nodularity is clearly identified. No omental nodularity. No retroperitoneal nodularity. No free fluid in the abdomen pelvis. Musculoskeletal: No aggressive osseous lesion. IMPRESSION: Chest Impression: No evidence of metastatic disease in thorax. Abdomen / Pelvis Impression: 1. Interval increase in size of solitary nodular peritoneal implant in the LEFT upper quadrant . 2. No additional evidence of peritoneal nodularity. 3. No ascites. Electronically Signed   By: SSuzy BouchardM.D.   On: 03/07/2017 15:00      IMPRESSION/PLAN: ILaqueshia Cihlaris a 76y.o. woman with recurrent stage IV ovarian cancer.  Patient appears to have an isolated recurrence in left upper quadrant of the abdomen. She would be a candidate for radiation therapy in light of the isolated recurrence (oligometastasis). Prior to considering radiation therapy however I have scheduled her to meet again with Dr. RDenman Georgeto see if surgery or additional chemotherapy may be an option for the patient.  Radiation treatment will be somewhat difficult for the patient as she lives approximately 2 hours from our facility, and approximately 1 hour from any other  radiation facility, per discussion with her son today. This area of recurrence could probably be treated in 10 fractions (2 weeks). We could possibly treat the area with SBRT, but not sure if we would get insurance approval for this treatment. [facility] where the patient lives however this facility would not be able to perform SBRT until a new Linac machine is placed at that location.  Final details concerning management are pending gynecologic oncology consultation.     ------------------------------------------------  JBlair Promise PhD, MD  This document serves as a record of services personally performed by JGery Pray MD. It was created on his behalf by SMaryla Morrow a trained medical scribe. The creation of this record is based on the scribe's personal observations and the provider's statements to them. This document has been checked and approved by the attending provider.

## 2017-04-05 ENCOUNTER — Encounter: Payer: Self-pay | Admitting: Gynecologic Oncology

## 2017-04-05 ENCOUNTER — Ambulatory Visit: Payer: Medicare Other | Attending: Gynecologic Oncology | Admitting: Gynecologic Oncology

## 2017-04-05 VITALS — BP 150/68 | HR 64 | Temp 97.9°F | Resp 20 | Wt 154.0 lb

## 2017-04-05 DIAGNOSIS — Z8042 Family history of malignant neoplasm of prostate: Secondary | ICD-10-CM | POA: Diagnosis not present

## 2017-04-05 DIAGNOSIS — Z801 Family history of malignant neoplasm of trachea, bronchus and lung: Secondary | ICD-10-CM | POA: Insufficient documentation

## 2017-04-05 DIAGNOSIS — Z9889 Other specified postprocedural states: Secondary | ICD-10-CM | POA: Diagnosis not present

## 2017-04-05 DIAGNOSIS — F209 Schizophrenia, unspecified: Secondary | ICD-10-CM | POA: Insufficient documentation

## 2017-04-05 DIAGNOSIS — K5090 Crohn's disease, unspecified, without complications: Secondary | ICD-10-CM | POA: Insufficient documentation

## 2017-04-05 DIAGNOSIS — Z9221 Personal history of antineoplastic chemotherapy: Secondary | ICD-10-CM

## 2017-04-05 DIAGNOSIS — C569 Malignant neoplasm of unspecified ovary: Secondary | ICD-10-CM | POA: Diagnosis present

## 2017-04-05 DIAGNOSIS — C786 Secondary malignant neoplasm of retroperitoneum and peritoneum: Secondary | ICD-10-CM | POA: Insufficient documentation

## 2017-04-05 DIAGNOSIS — E782 Mixed hyperlipidemia: Secondary | ICD-10-CM | POA: Diagnosis not present

## 2017-04-05 DIAGNOSIS — Z79899 Other long term (current) drug therapy: Secondary | ICD-10-CM | POA: Insufficient documentation

## 2017-04-05 DIAGNOSIS — E559 Vitamin D deficiency, unspecified: Secondary | ICD-10-CM | POA: Insufficient documentation

## 2017-04-05 DIAGNOSIS — Z90710 Acquired absence of both cervix and uterus: Secondary | ICD-10-CM | POA: Diagnosis not present

## 2017-04-05 DIAGNOSIS — Z8543 Personal history of malignant neoplasm of ovary: Secondary | ICD-10-CM | POA: Insufficient documentation

## 2017-04-05 DIAGNOSIS — Z8249 Family history of ischemic heart disease and other diseases of the circulatory system: Secondary | ICD-10-CM | POA: Insufficient documentation

## 2017-04-05 DIAGNOSIS — E538 Deficiency of other specified B group vitamins: Secondary | ICD-10-CM | POA: Insufficient documentation

## 2017-04-05 DIAGNOSIS — R918 Other nonspecific abnormal finding of lung field: Secondary | ICD-10-CM | POA: Diagnosis not present

## 2017-04-05 DIAGNOSIS — I10 Essential (primary) hypertension: Secondary | ICD-10-CM | POA: Insufficient documentation

## 2017-04-05 DIAGNOSIS — Z7982 Long term (current) use of aspirin: Secondary | ICD-10-CM | POA: Insufficient documentation

## 2017-04-05 NOTE — Patient Instructions (Signed)
Please notify Dr Denman George at phone number [phone] if you notice vaginal bleeding, new pelvic or abdominal pains, bloating, feeling full easy, or a change in bladder or bowel function.   Please follow-up with Dr Denman George as scheduled in December, 2018

## 2017-04-05 NOTE — Progress Notes (Signed)
Followup Note: Gyn-Onc  Latasha [name] 76 y.o. 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 Ovarian Cancer    Assessment/Plan:  [name]  is a 76 y.o.  year old with recurrent stage IVB ovarian cancer, with isolated perisplenic recurrence, 14 months s/p completion of primary platinum chemotherapy.  I discussed with Ms Curtner that she is an excellent candidate for secondary debulking (she has oligometastatic disease, long disease free interval, platinum sensitivity, excellent performance status). This would ideally be followed by repeating 6 cycles of carb/tax and PARP inhibitor maintenance. I explained that this would be associated with the best possibility of achieving another enduring disease free interval.  I discussed that alternatives could be chemotherapy alone (no surgery), or expectant management with institution of chemotherapy when she develops symptomatic recurrence. I discussed that these options were likely to be less able to induce another complete response to therapy.   The patient expressed understanding of her options and is electing for no treatment at this time. She is asymptomatic and enjoying her "remission". She understands that the disease has returned but is not interested in another surgery or chemotherapy at this time. She will consider chemotherapy if her disease progresses to a point that it is symptomatic again.  Given her lack of interest in treating occult, asymptomatic disease, I see no reason for intensive surveillance at this time. Instead I will have her come back to see me in 6 months. If she is doing well we will not intervene. If she has symptoms of recurrence we will order CT imaging to evaluate for the site and extent of disease and discuss retreatment with a platinum containing chemotherapy regimen at that time if she is interested.  HPI: Latasha [name] is a very  pleasant 76 year old G1 who is seen in consultation at the request of Dr Whitney Muse for (clinical) stage IVB ovarian cancer. The patient developed symptoms of progressive abdominal fullness and discomfort over the course of several months. In August, 2016 she presented to the [facility] where imaging of the abdomen and pelvis was obtained for diarrhea, nausea and emesis. Imaging on 04/25/15 revealed large volume ascites, carcinomatosis and peritoneal masses measuring up to 5.1cm. The patient underwent paracentesis with cytology on 04/27/15 which revealed metastatic adenocarcinoma consistent on immunostains with gyn primary.  CT of the chest on 04/27/15 revealed mild left supraclavicular lymphadenopathy, and mediastinal lymphadenopathy consistent with metastatic disease. There were <69m pulmonary nodules also seen which were indeterminant. There were trace pleural effusions seen.   CA 125 on 04/25/15: 3902.  She then went on to receive 3 cycles of paclitaxel and carboplatin chemotherapy with Dr PWhitney Muse Cycle 3 was on 07/01/15.  CA 125 on 07/01/15 was 249.  CT scan on 07/20/15 showed: Previously noted ascites has resolved. No pneumoperitoneum. Many of the previously noted peritoneal implants are no longer confidently identified on today's examination. There are few smaller implants noted, the largest of which is in th  left side of the abdomen anteriorly measuring 9 x 11 mm. There is also a 10 x 7 mm lesion superficial to the splenic flexure of the colon which is substantially smaller than the prior study (previously 2.8 x 1.7 cm).  There was resolution of the chest adenopathy.  She has tolerated chemotherapy well with a good appetites and good general function.  On 07/31/15 she was taken to the OR for an ex lap, TAH,  BSO, omentectomy, radical tumor debulking. Intraoperative findings included: Excellent response to neoadjuvant chemotherapy. Small volume plaques/nodules in left and right posterior cul de sac  behind uterus, tumor on left ovary (3cm), multiple 2cm nodules in omentum. No other peritoneal disease. Diaphragm normal.  She had an optimal cytoreduction (R0) with no gross visible disease remaining.  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  Postoperatively she did very well with no postoperative complications.   Interval Hx:  She went on to receive 3 additional cycles of carboplatin and paclitaxel completed April, 2017. She tolerated therapy very well.  Post treatment scans were unremarkable and showed no residual disease.  Her CA 125 was monitored at 3 monthly intervals. In January, 2018 it was normal at 17. On April, 20th ,2018 it had increased to 32. On June 15th, 2018 it had increased again to 47. CT chest/abdo/pelvis on 03/07/17 showed solitary pertoneal nodule in the gastrosplenic ligament measuring 47m x 123m(increased from a prior scan where it was 1cm). No additional nodularity is noted nor ascites.  Current Meds:  Outpatient Encounter Prescriptions as of 04/05/2017  Medication Sig  . acetaminophen (TYLENOL) 500 MG tablet Take 2 tablets (1,000 mg total) by mouth every 12 (twelve) hours.  . Marland KitchenmLODipine (NORVASC) 10 MG tablet Take 10 mg by mouth every evening.   . Marland Kitchenspirin EC 81 MG tablet Take 81 mg by mouth daily.  . Cholecalciferol (VITAMIN D-3) 1000 UNITS CAPS Take 1,000 Units by mouth daily.   . Marland Kitchenocusate sodium (COLACE) 100 MG capsule Take 100 mg by mouth 2 (two) times daily.  . folic acid (FOLVITE) 1 MG tablet Take 1 mg by mouth daily.  . Marland Kitchenovastatin (MEVACOR) 10 MG tablet Take 10 mg by mouth at bedtime.  . metoprolol tartrate (LOPRESSOR) 25 MG tablet Take 1 tablet (25 mg total) by mouth 2 (two) times daily.  . mirtazapine (REMERON) 15 MG tablet Take 7.5 mg by mouth at bedtime.  . Multiple Vitamin (MULTIVITAMIN WITH MINERALS) TABS tablet Take 1 tablet by mouth daily.  . ondansetron (ZOFRAN) 4 MG tablet Take 1 tablet (4 mg total) by  mouth every 6 (six) hours as needed for nausea.  . Marland KitchenUEtiapine (SEROQUEL XR) 300 MG 24 hr tablet Take 300 mg by mouth at bedtime.  . sulfaSALAzine (AZULFIDINE) 500 MG tablet Take 500 mg by mouth 3 (three) times daily.  . traMADol (ULTRAM) 50 MG tablet Take 1 tablet (50 mg total) by mouth every 6 (six) hours as needed.  . vitamin B-12 (CYANOCOBALAMIN) 1000 MCG tablet Take 1,000 mcg by mouth daily.   No facility-administered encounter medications on file as of 04/05/2017.     Allergy: No Known Allergies  Social Hx:   Social History   Social History  . Marital status: Single    Spouse name: N/A  . Number of children: 1  . Years of education: N/A   Occupational History  . Not on file.   Social History Main Topics  . Smoking status: Never Smoker  . Smokeless tobacco: Never Used  . Alcohol use No  . Drug use: No  . Sexual activity: Not Currently   Other Topics Concern  . Not on file   Social History Narrative  . No narrative on file    Past Surgical Hx:  Past Surgical History:  Procedure Laterality Date  . ABDOMINAL HYSTERECTOMY N/A 07/28/2015   Procedure: TOTAL HYSTERECTOMY ABDOMINAL BILATERAL SALPINGO OOPHRORECTOMY;  Surgeon: EmEveritt AmberMD;  Location: [facility];  Service: Gynecology;  Laterality: N/A;  . DEBULKING N/A 07/28/2015   Procedure: DEBULKING;  Surgeon: Everitt Amber, MD;  Location: [facility];  Service: Gynecology;  Laterality: N/A;  . LAPAROTOMY N/A 07/28/2015   Procedure: EXPLORATORY LAPAROTOMY;  Surgeon: Everitt Amber, MD;  [facility];  Service: Gynecology;  Laterality: N/A;  . OMENTECTOMY N/A 07/28/2015   Procedure: OMENTECTOMY ;  Surgeon: Everitt Amber, MD;  [facility]: [facility];  Service: Gynecology;  Laterality: N/A;  . PARACENTESIS  04/27/15  . PORTACATH PLACEMENT Right 04/2015  . REPLACEMENT TOTAL KNEE     right knee in 2003    Past Medical Hx:  Past Medical History:  Diagnosis Date  . Arthritis   . B12 deficiency   . Cataract   . History of blood transfusion   .  History of chemotherapy   . Hypertension   . Iron deficiency anemia   . Mixed hyperlipidemia   . Ovarian cancer ([place])   . 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 Schizophrenia ([place])   . Ulcerative colitis ([place])   . Vitamin D deficiency     Past Gynecological History:  SVD x 1  No LMP recorded. Patient is postmenopausal.  Family Hx:  Family History  Problem Relation Age of Onset  . Prostate cancer Brother   . Cancer Paternal Uncle        1 uncle with cancer NOS  . Lung cancer Maternal Grandmother   . Hypertension Maternal Grandfather   . Congestive Heart Failure Mother   . Seizures Sister     Review of Systems:  Constitutional  Feels well,    ENT Normal appearing ears and nares bilaterally Skin/Breast  No rash, sores, jaundice, itching, dryness Cardiovascular  No chest pain, shortness of breath, or edema  Pulmonary  No cough or wheeze.  Gastro Intestinal  No nausea, vomitting, or diarrhoea. No bright red blood per rectum, no abdominal pain, change in bowel movement, or constipation.  Genito Urinary  No frequency, urgency, dysuria, no postmenopausal bleeding Musculo Skeletal  No myalgia, arthralgia, joint swelling or pain  Neurologic  No weakness, numbness, change in gait,  Psychology  No depression, anxiety, insomnia.   Vitals:  Blood pressure (!) 150/68, pulse 64, temperature 97.9 F (36.6 C), temperature source Oral, resp. rate 20, weight 154 lb (69.9 kg), SpO2 97 %.  Physical Exam: WD in NAD Neck  Supple NROM, without any enlargements.  Lymph Node Survey No cervical supraclavicular or inguinal adenopathy Cardiovascular  Pulse normal rate, regularity and rhythm. S1 and S2 normal.  Lungs  Clear to auscultation bilateraly, without wheezes/crackles/rhonchi. Good air movement.  Skin  No rash/lesions/breakdown  Psychiatry  Alert and oriented to person, place, and time  Abdomen  Normoactive bowel sounds, abdomen soft, non distended, non-tender and thin without  evidence of hernia.  Incision well healed. No palpable masses. Back No CVA tenderness Genito Urinary  Vaginal cuff normal with no lesions. In tact vagina.No bleeding. Surgically absent uterus and cervix. Extremities  No bilateral cyanosis, clubbing or edema.   [name], MD   04/05/2017, 5:12 PM

## 2017-04-07 ENCOUNTER — Other Ambulatory Visit (HOSPITAL_COMMUNITY): Payer: Medicare Other

## 2017-04-07 ENCOUNTER — Ambulatory Visit (HOSPITAL_COMMUNITY): Payer: Medicare Other

## 2017-04-28 ENCOUNTER — Encounter (HOSPITAL_COMMUNITY): Payer: Medicare Other

## 2017-05-08 ENCOUNTER — Encounter (HOSPITAL_COMMUNITY): Payer: Self-pay

## 2017-05-08 ENCOUNTER — Encounter (HOSPITAL_BASED_OUTPATIENT_CLINIC_OR_DEPARTMENT_OTHER): Payer: Medicare Other

## 2017-05-08 ENCOUNTER — Encounter (HOSPITAL_COMMUNITY): Payer: Medicare Other | Attending: Hematology | Admitting: Oncology

## 2017-05-08 VITALS — BP 183/72 | HR 83 | Resp 16 | Ht 66.0 in | Wt 157.0 lb

## 2017-05-08 DIAGNOSIS — I10 Essential (primary) hypertension: Secondary | ICD-10-CM

## 2017-05-08 DIAGNOSIS — C562 Malignant neoplasm of left ovary: Secondary | ICD-10-CM

## 2017-05-08 DIAGNOSIS — C569 Malignant neoplasm of unspecified ovary: Secondary | ICD-10-CM | POA: Insufficient documentation

## 2017-05-08 DIAGNOSIS — D72829 Elevated white blood cell count, unspecified: Secondary | ICD-10-CM | POA: Insufficient documentation

## 2017-05-08 DIAGNOSIS — R180 Malignant ascites: Secondary | ICD-10-CM | POA: Insufficient documentation

## 2017-05-08 MED ORDER — SODIUM CHLORIDE 0.9% FLUSH
10.0000 mL | INTRAVENOUS | Status: DC | PRN
  Administered 2017-05-08: 10 mL via INTRAVENOUS
  Filled 2017-05-08: qty 10

## 2017-05-08 MED ORDER — HEPARIN SOD (PORK) LOCK FLUSH 100 UNIT/ML IV SOLN
500.0000 [IU] | Freq: Once | INTRAVENOUS | Status: AC
  Administered 2017-05-08: 500 [IU] via INTRAVENOUS

## 2017-05-08 MED ORDER — METOPROLOL TARTRATE 25 MG PO TABS: 25.0000 mg | ORAL_TABLET | Freq: Two times a day (BID) | ORAL | 6 refills | Status: AC

## 2017-05-08 MED ORDER — TRAMADOL HCL 50 MG PO TABS: 50.0000 mg | ORAL_TABLET | Freq: Four times a day (QID) | ORAL | 1 refills | Status: DC | PRN

## 2017-05-08 NOTE — Progress Notes (Signed)
HEMATOLOGY ONCOLOGY PROGRESS NOTE  Date of service: .05/08/2017  Patient Care Team: Patient, No Pcp Per as PCP - General (General Practice)   CC: f/u for Ovarian Cancer  SUMMARY OF ONCOLOGIC HISTORY:   Ovarian cancer ([place])   04/25/2015 - 04/28/2015 Hospital Admission    Nausea/diarrhea.  Oncology consult completed on 04/27/2015.      04/25/2015 Tumor Marker    CA 125- 3902.      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      04/27/2015 Procedure    US Paracentesis- A total of approximately 3700 mL of amber colored fluid was removed. A fluid sample was sent for laboratory analysis.      04/27/2015 Imaging    CT Chest- Mild left supraclavicular lymphadenopathy and moderate mediastinal lymphadenopathy involving the prevascular, subcarinal and bilateral pericardiophrenic nodal chains, likely metastatic.      04/27/2015 Pathology Results    Diagnosis PERITONEAL/ASCITIC FLUID(SPECIMEN 1 OF 1 COLLECTED 04/27/15): MALIGNANT CELLS CONSISTENT WITH METASTATIC ADENOCARCINOMA.  The immunophenotype is most consistent with a gynecologic primary, most likely ovary.      05/08/2015 Miscellaneous    Seen by Dr. Denman George- recommending Carboplatin/Paclitaxel x 3 cycles and return visit to see her (within 1 week of administration of third cycle) to evaluate for optimal sequencing of treatment modalities.      05/13/2015 - 05/15/2015 Hospital Admission    Hospitalized for AKI      05/19/2015 - 07/01/2015 Chemotherapy    Carboplatin/Paclitaxel x 3 cycles      07/28/2015 Surgery    Exploratory laparotomy with total abdominal hysterectomy, bilateral salpingo-oophorectomy, omentectomy radical tumor debulking for ovarian cancer; by Dr. Everitt Amber.      07/28/2015 Pathology Results    Uterus +/- tubes/ovaries, neoplastic, cervix - HIGH GRADE SEROUS  CARCINOMA INVOLVING LEFT OVARY, LEFT FALLOPIAN TUBE AND RIGHT FALLOPIAN TUBE. - CERVIX, ENDOMETRIUM AND MYOMETRIUM ARE FREE OF TUMOR. 2. Cul-de-sac biop...      08/26/2015 -  Chemotherapy    Carboplatin/Paclitaxel x 3 cycles      12/23/2015 Miscellaneous    Genetic counseling.  Genetic testing was normal, and did not reveal a deleterious mutation in these genes      03/07/2017 Imaging    CT C/A/P IMPRESSION: Chest Impression:  No evidence of metastatic disease in thorax.  Abdomen / Pelvis Impression:  1. Interval increase in size of solitary nodular peritoneal implant in the LEFT upper quadrant . 2. No additional evidence of peritoneal nodularity. 3. No ascites.       INTERVAL HISTORY:  Presents for follow-up and she states that she has been doing well. She denies any chest pain, shortness breath, abdominal pain, abdominal bloating. She states her appetite is good. Since her last visit she has seen both Dr. Denman George and Dr. Sondra Come regarding treatment options for her solitary peritoneal implant.  REVIEW OF SYSTEMS:    12 Point review of systems of done and is negative except as noted above.  . Past Medical History:  Diagnosis Date  . Arthritis   . B12 deficiency   . Cataract   . History of blood transfusion   . History of chemotherapy   . Hypertension   . Iron deficiency anemia   . Mixed hyperlipidemia   . Ovarian cancer ([place])   . 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 Schizophrenia ([place])   .  Ulcerative colitis ([place])   . Vitamin D deficiency     . Past Surgical History:  Procedure Laterality Date  . ABDOMINAL HYSTERECTOMY N/A 07/28/2015   Procedure: TOTAL HYSTERECTOMY ABDOMINAL BILATERAL SALPINGO OOPHRORECTOMY;  Surgeon: Everitt Amber, MD;  [facility];  Service: Gynecology;  Laterality: N/A;  . DEBULKING N/A 07/28/2015   Procedure: DEBULKING;  Surgeon: Everitt Amber, MD;  [facility];  Service: Gynecology;  Laterality: N/A;  . LAPAROTOMY N/A 07/28/2015   Procedure:  EXPLORATORY LAPAROTOMY;  Surgeon: Everitt Amber, MD;  [facility];  Service: Gynecology;  Laterality: N/A;  . OMENTECTOMY N/A 07/28/2015   Procedure: OMENTECTOMY ;  Surgeon: Everitt Amber, MD;  [facility];  Service: Gynecology;  Laterality: N/A;  . PARACENTESIS  04/27/15  . PORTACATH PLACEMENT Right 04/2015  . REPLACEMENT TOTAL KNEE     right knee in 2003    . Social History  Substance Use Topics  . Smoking status: Never Smoker  . Smokeless tobacco: Never Used  . Alcohol use No    ALLERGIES:  has No Known Allergies.  MEDICATIONS:  Current Outpatient Prescriptions  Medication Sig Dispense Refill  . acetaminophen (TYLENOL) 500 MG tablet Take 2 tablets (1,000 mg total) by mouth every 12 (twelve) hours. 30 tablet 0  . amLODipine (NORVASC) 10 MG tablet Take 10 mg by mouth every evening.     Marland Kitchen aspirin EC 81 MG tablet Take 81 mg by mouth daily.    . Cholecalciferol (VITAMIN D-3) 1000 UNITS CAPS Take 1,000 Units by mouth daily.     Marland Kitchen docusate sodium (COLACE) 100 MG capsule Take 100 mg by mouth 2 (two) times daily.    . folic acid (FOLVITE) 1 MG tablet Take 1 mg by mouth daily.    Marland Kitchen lovastatin (MEVACOR) 10 MG tablet Take 10 mg by mouth at bedtime.    . metoprolol tartrate (LOPRESSOR) 25 MG tablet Take 1 tablet (25 mg total) by mouth 2 (two) times daily. 60 tablet 6  . mirtazapine (REMERON) 15 MG tablet Take 7.5 mg by mouth at bedtime.    . Multiple Vitamin (MULTIVITAMIN WITH MINERALS) TABS tablet Take 1 tablet by mouth daily.    . ondansetron (ZOFRAN) 4 MG tablet Take 1 tablet (4 mg total) by mouth every 6 (six) hours as needed for nausea. 20 tablet 0  . QUEtiapine (SEROQUEL XR) 300 MG 24 hr tablet Take 300 mg by mouth at bedtime.    . sulfaSALAzine (AZULFIDINE) 500 MG tablet Take 500 mg by mouth 3 (three) times daily.    . traMADol (ULTRAM) 50 MG tablet Take 1 tablet (50 mg total) by mouth every 6 (six) hours as needed. 90 tablet 1  . vitamin B-12 (CYANOCOBALAMIN) 1000 MCG tablet Take  1,000 mcg by mouth daily.     No current facility-administered medications for this visit.    Facility-Administered Medications Ordered in Other Visits  Medication Dose Route Frequency Provider Last Rate Last Dose  . sodium chloride flush (NS) 0.9 % injection 10 mL  10 mL Intravenous PRN Holley Bouche, NP   10 mL at 05/08/17 1520    PHYSICAL EXAMINATION: ECOG PERFORMANCE STATUS: 1 - Symptomatic but completely ambulatory  . Vitals:   05/08/17 1521  BP: (!) 183/72  Pulse: 83  Resp: 16  SpO2: 95%    Filed Weights   05/08/17 1521  Weight: 157 lb (71.2 kg)   .Body mass index is 25.34 kg/m.  Constitutional: Well-developed, well-nourished, and in no distress.  HENT:  Head: Normocephalic and atraumatic.  Mouth/Throat: No oropharyngeal exudate. Mucosa moist. Eyes: Pupils are equal, round, and reactive to light. Conjunctivae are normal. No scleral icterus.  Neck: Normal range of motion. Neck supple. No JVD present.  Cardiovascular: Normal rate, regular rhythm and normal heart sounds.  Exam reveals no gallop and no friction rub.   No murmur heard. Pulmonary/Chest: Effort normal and breath sounds normal. No respiratory distress. No wheezes.No rales.  Abdominal: Soft. Bowel sounds are normal. No distension. There is no tenderness. There is no guarding.  Musculoskeletal: No edema or tenderness.  Lymphadenopathy:    No cervical or supraclavicular adenopathy.  Neurological: Alert and oriented to person, place, and time. No cranial nerve deficit.  Skin: Skin is warm and dry. No rash noted. No erythema. No pallor.  Psychiatric: Affect and judgment normal.    LABORATORY DATA:   I have reviewed the data as listed  . CBC Latest Ref Rng & Units 03/03/2017 01/06/2017 10/05/2016  WBC 4.0 - 10.5 K/uL 4.8 5.0 6.5  Hemoglobin 12.0 - 15.0 g/dL 10.9(L) 11.3(L) 11.2(L)  Hematocrit 36.0 - 46.0 % 32.7(L) 33.7(L) 33.1(L)  Platelets 150 - 400 K/uL 252 276 275   . CBC    Component Value  Date/Time   WBC 4.8 03/03/2017 1008   RBC 3.18 (L) 03/03/2017 1008   HGB 10.9 (L) 03/03/2017 1008   HCT 32.7 (L) 03/03/2017 1008   PLT 252 03/03/2017 1008   MCV 102.8 (H) 03/03/2017 1008   MCH 34.3 (H) 03/03/2017 1008   MCHC 33.3 03/03/2017 1008   RDW 13.2 03/03/2017 1008   LYMPHSABS 1.5 03/03/2017 1008   MONOABS 0.7 03/03/2017 1008   EOSABS 0.2 03/03/2017 1008   BASOSABS 0.0 03/03/2017 1008    CMP Latest Ref Rng & Units 03/03/2017 01/06/2017 07/05/2016  Glucose 65 - 99 mg/dL 91 86 89  BUN 6 - 20 mg/dL _0 Creatinine 0.44 - 1.00 mg/dL 0.99 0.92 0.89  Sodium 135 - 145 mmol/L 139 138 138  Potassium 3.5 - 5.1 mmol/L 3.8 4.0 3.9  Chloride 101 - 111 mmol/L 106 102 103  CO2 22 - 32 mmol/L _1 Calcium 8.9 - 10.3 mg/dL 9.1 9.3 9.0  Total Protein 6.5 - 8.1 g/dL 7.1 7.0 7.2  Total Bilirubin 0.3 - 1.2 mg/dL 0.7 0.5 0.5  Alkaline Phos 38 - 126 U/L 73 75 73  AST 15 - 41 U/L _2 ALT 14 - 54 U/L _3 . Lab Results  Component Value Date   CA125 47.3 (H) 03/03/2017     RADIOGRAPHIC STUDIES: I have personally reviewed the radiological images as listed and agreed with the findings in the report. No results found.  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 Solitary peritoneal implant  Patient has met with Dr. Denman George and Dr. Sondra Come. Dr. Denman George recommended secondary debulking followed by repeating 6 cycles of carb/tax and PARP inhibitor maintenance. However patient has decided on observation instead since she is enjoying her time off treatment and feels well. Dr. Sondra Come recommended that patient may be able to receive RT but is worried that she will have difficulties with transportation since her treatment would be over a period of 2 weeks. I have discussed all of these possible options with the patient again and she continues to wish to hold off on any treatment and to continue  observation.  Therefore  we will see her back in 3 months with labs and restaging CT C/A/P.

## 2017-05-08 NOTE — Progress Notes (Signed)
Latasha [name] presented for Portacath access and flush. Portacath located right  chest wall accessed with  H 20 needle. Good blood return present. Portacath flushed with 55m NS and 500U/571mHeparin and needle removed intact. Procedure without incident. Patient tolerated procedure well.

## 2017-07-03 ENCOUNTER — Encounter (HOSPITAL_COMMUNITY): Payer: Medicare Other | Attending: Hematology

## 2017-07-03 ENCOUNTER — Encounter (HOSPITAL_COMMUNITY): Payer: Self-pay

## 2017-07-03 DIAGNOSIS — Z23 Encounter for immunization: Secondary | ICD-10-CM | POA: Diagnosis present

## 2017-07-03 DIAGNOSIS — C569 Malignant neoplasm of unspecified ovary: Secondary | ICD-10-CM | POA: Insufficient documentation

## 2017-07-03 DIAGNOSIS — D72829 Elevated white blood cell count, unspecified: Secondary | ICD-10-CM | POA: Insufficient documentation

## 2017-07-03 DIAGNOSIS — R180 Malignant ascites: Secondary | ICD-10-CM | POA: Insufficient documentation

## 2017-07-03 MED ORDER — SODIUM CHLORIDE 0.9% FLUSH
10.0000 mL | INTRAVENOUS | Status: DC | PRN
  Administered 2017-07-03: 10 mL via INTRAVENOUS
  Filled 2017-07-03: qty 10

## 2017-07-03 MED ORDER — INFLUENZA VAC SPLIT QUAD 0.5 ML IM SUSY
0.5000 mL | PREFILLED_SYRINGE | Freq: Once | INTRAMUSCULAR | Status: AC
  Administered 2017-07-03: 0.5 mL via INTRAMUSCULAR

## 2017-07-03 MED ORDER — HEPARIN SOD (PORK) LOCK FLUSH 100 UNIT/ML IV SOLN
500.0000 [IU] | Freq: Once | INTRAVENOUS | Status: AC
  Administered 2017-07-03: 500 [IU] via INTRAVENOUS

## 2017-07-03 MED ORDER — INFLUENZA VAC SPLIT QUAD 0.5 ML IM SUSY
PREFILLED_SYRINGE | INTRAMUSCULAR | Status: AC
  Filled 2017-07-03: qty 0.5

## 2017-07-03 NOTE — Patient Instructions (Signed)
[place] at [facility] Discharge Instructions  RECOMMENDATIONS MADE BY THE CONSULTANT AND ANY TEST RESULTS WILL BE SENT TO YOUR REFERRING PHYSICIAN.  Port flush done, flu shot given Follow up as scheduled.  Thank you for choosing [place] at [facility] to provide your oncology and hematology care.  To afford each patient quality time with our provider, please arrive at least 15 minutes before your scheduled appointment time.    If you have a lab appointment with the [facility] please come in thru the  Main Entrance and check in at the main information desk  You need to re-schedule your appointment should you arrive 10 or more minutes late.  We strive to give you quality time with our providers, and arriving late affects you and other patients whose appointments are after yours.  Also, if you no show three or more times for appointments you may be dismissed from the clinic at the providers discretion.     Again, thank you for choosing [facility].  Our hope is that these requests will decrease the amount of time that you wait before being seen by our physicians.       _____________________________________________________________  Should you have questions after your visit to [facility], please contact our office at [phone] between the hours of 8:30 a.m. and 4:30 p.m.  Voicemails left after 4:30 p.m. will not be returned until the following business day.  For prescription refill requests, have your pharmacy contact our office.       Resources For Cancer Patients and their Caregivers ? American Cancer Society: Can assist with transportation, wigs, general needs, runs Look Good Feel Better.        431-757-4694 ? Cancer Care: Provides financial assistance, online support groups, medication/co-pay assistance.  1-800-813-HOPE (404) 845-3606) ? [facility] Assists [place] cancer  patients and their families through emotional , educational and financial support.  732-716-1152 ? [place] DSS Where to apply for food stamps, Medicaid and utility assistance. (913)516-3928 ? [employer]: Transportation to medical appointments. 2094113378 ? Social Security Administration: May apply for disability if have a Stage IV cancer. 901-506-7560 684-684-9177 ? [employer], Disability and Transit Services: Assists with nutrition, care and transit needs. [place] Support Programs: @10RELATIVEDAYS @ > Cancer Support Group  2nd Tuesday of the month 1pm-2pm, Journey Room  > Creative Journey  3rd Tuesday of the month 1130am-1pm, Journey Room  > Look Good Feel Better  1st Wednesday of the month 10am-12 noon, Journey Room (Call [place] to register [phone])

## 2017-07-03 NOTE — Progress Notes (Signed)
Latasha [name] presented for Portacath access and flush. Portacath located right chest wall accessed with  H 20 needle. Good blood return present. Portacath flushed with 73m NS and 500U/566mHeparin and needle removed intact. Procedure without incident. Patient tolerated procedure well.    Latasha Dejongeresents today for injection per MD orders. Fluarix 0.66m17madministered IM  in left upper arm. Administration without incident. Patient tolerated well.   Treatment given per orders. Patient tolerated it well without problems. Vitals stable and discharged home from clinic ambulatory. Follow up as scheduled.

## 2017-07-27 ENCOUNTER — Encounter (HOSPITAL_COMMUNITY): Payer: Medicare Other | Attending: Oncology

## 2017-07-27 DIAGNOSIS — Z9221 Personal history of antineoplastic chemotherapy: Secondary | ICD-10-CM | POA: Diagnosis not present

## 2017-07-27 DIAGNOSIS — I10 Essential (primary) hypertension: Secondary | ICD-10-CM | POA: Diagnosis not present

## 2017-07-27 DIAGNOSIS — Z79899 Other long term (current) drug therapy: Secondary | ICD-10-CM | POA: Diagnosis not present

## 2017-07-27 DIAGNOSIS — E782 Mixed hyperlipidemia: Secondary | ICD-10-CM | POA: Diagnosis not present

## 2017-07-27 DIAGNOSIS — Z8543 Personal history of malignant neoplasm of ovary: Secondary | ICD-10-CM | POA: Insufficient documentation

## 2017-07-27 DIAGNOSIS — Z08 Encounter for follow-up examination after completed treatment for malignant neoplasm: Secondary | ICD-10-CM | POA: Diagnosis present

## 2017-07-27 DIAGNOSIS — E538 Deficiency of other specified B group vitamins: Secondary | ICD-10-CM | POA: Insufficient documentation

## 2017-07-27 DIAGNOSIS — C569 Malignant neoplasm of unspecified ovary: Secondary | ICD-10-CM

## 2017-07-27 DIAGNOSIS — F209 Schizophrenia, unspecified: Secondary | ICD-10-CM | POA: Diagnosis not present

## 2017-07-27 DIAGNOSIS — Z7982 Long term (current) use of aspirin: Secondary | ICD-10-CM | POA: Insufficient documentation

## 2017-07-27 LAB — CBC WITH DIFFERENTIAL/PLATELET
BASOS ABS: 0 10*3/uL (ref 0.0–0.1)
BASOS PCT: 1 %
Eosinophils Absolute: 0.3 10*3/uL (ref 0.0–0.7)
Eosinophils Relative: 6 %
HEMATOCRIT: 34.9 % — AB (ref 36.0–46.0)
HEMOGLOBIN: 11.6 g/dL — AB (ref 12.0–15.0)
Lymphocytes Relative: 26 %
Lymphs Abs: 1.1 10*3/uL (ref 0.7–4.0)
MCH: 34.9 pg — ABNORMAL HIGH (ref 26.0–34.0)
MCHC: 33.2 g/dL (ref 30.0–36.0)
MCV: 105.1 fL — ABNORMAL HIGH (ref 78.0–100.0)
Monocytes Absolute: 0.5 10*3/uL (ref 0.1–1.0)
Monocytes Relative: 11 %
NEUTROS ABS: 2.4 10*3/uL (ref 1.7–7.7)
NEUTROS PCT: 56 %
Platelets: 286 10*3/uL (ref 150–400)
RBC: 3.32 MIL/uL — ABNORMAL LOW (ref 3.87–5.11)
RDW: 13.3 % (ref 11.5–15.5)
WBC: 4.2 10*3/uL (ref 4.0–10.5)

## 2017-07-27 LAB — COMPREHENSIVE METABOLIC PANEL
ALBUMIN: 4.2 g/dL (ref 3.5–5.0)
ALK PHOS: 79 U/L (ref 38–126)
ALT: 28 U/L (ref 14–54)
AST: 29 U/L (ref 15–41)
Anion gap: 7 (ref 5–15)
BILIRUBIN TOTAL: 0.7 mg/dL (ref 0.3–1.2)
BUN: 10 mg/dL (ref 6–20)
CO2: 31 mmol/L (ref 22–32)
Calcium: 9.1 mg/dL (ref 8.9–10.3)
Chloride: 104 mmol/L (ref 101–111)
Creatinine, Ser: 1.01 mg/dL — ABNORMAL HIGH (ref 0.44–1.00)
GFR calc Af Amer: >60 mL/min (ref >60)
GFR calc non Af Amer: 54 mL/min — ABNORMAL LOW (ref >60)
GLUCOSE: 100 mg/dL — AB (ref 65–99)
POTASSIUM: 3.8 mmol/L (ref 3.5–5.1)
SODIUM: 142 mmol/L (ref 135–145)
TOTAL PROTEIN: 7.1 g/dL (ref 6.5–8.1)

## 2017-07-28 LAB — CA 125: CANCER ANTIGEN (CA) 125: 135.9 U/mL — AB (ref 0.0–38.1)

## 2017-07-31 ENCOUNTER — Ambulatory Visit (HOSPITAL_COMMUNITY)
Admission: RE | Admit: 2017-07-31 | Discharge: 2017-07-31 | Disposition: A | Discharge status: Home / Self Care | Payer: Medicare Other | Source: Ambulatory Visit | Attending: Oncology | Admitting: Oncology

## 2017-07-31 DIAGNOSIS — E279 Disorder of adrenal gland, unspecified: Secondary | ICD-10-CM | POA: Insufficient documentation

## 2017-07-31 DIAGNOSIS — C569 Malignant neoplasm of unspecified ovary: Secondary | ICD-10-CM | POA: Diagnosis present

## 2017-07-31 DIAGNOSIS — Z9889 Other specified postprocedural states: Secondary | ICD-10-CM | POA: Insufficient documentation

## 2017-07-31 DIAGNOSIS — R1902 Left upper quadrant abdominal swelling, mass and lump: Secondary | ICD-10-CM | POA: Diagnosis not present

## 2017-07-31 DIAGNOSIS — Z90710 Acquired absence of both cervix and uterus: Secondary | ICD-10-CM | POA: Diagnosis not present

## 2017-07-31 DIAGNOSIS — R918 Other nonspecific abnormal finding of lung field: Secondary | ICD-10-CM | POA: Diagnosis not present

## 2017-07-31 DIAGNOSIS — R1904 Left lower quadrant abdominal swelling, mass and lump: Secondary | ICD-10-CM | POA: Insufficient documentation

## 2017-07-31 MED ORDER — IOPAMIDOL (ISOVUE-300) INJECTION 61%
100.0000 mL | Freq: Once | INTRAVENOUS | Status: AC | PRN
  Administered 2017-07-31: 100 mL via INTRAVENOUS

## 2017-08-02 ENCOUNTER — Encounter (HOSPITAL_COMMUNITY): Payer: Self-pay | Admitting: Oncology

## 2017-08-02 ENCOUNTER — Encounter (HOSPITAL_COMMUNITY): Payer: Medicare Other

## 2017-08-02 ENCOUNTER — Other Ambulatory Visit: Payer: Self-pay

## 2017-08-02 ENCOUNTER — Encounter (HOSPITAL_BASED_OUTPATIENT_CLINIC_OR_DEPARTMENT_OTHER): Payer: Medicare Other | Admitting: Oncology

## 2017-08-02 VITALS — BP 166/74 | HR 65 | Temp 98.0°F | Resp 18 | Ht 66.0 in | Wt 158.3 lb

## 2017-08-02 DIAGNOSIS — C562 Malignant neoplasm of left ovary: Secondary | ICD-10-CM

## 2017-08-02 DIAGNOSIS — C7800 Secondary malignant neoplasm of unspecified lung: Secondary | ICD-10-CM

## 2017-08-02 DIAGNOSIS — R935 Abnormal findings on diagnostic imaging of other abdominal regions, including retroperitoneum: Secondary | ICD-10-CM

## 2017-08-02 DIAGNOSIS — C569 Malignant neoplasm of unspecified ovary: Secondary | ICD-10-CM

## 2017-08-02 MED ORDER — SODIUM CHLORIDE 0.9% FLUSH
10.0000 mL | INTRAVENOUS | Status: DC | PRN
  Administered 2017-08-02: 10 mL via INTRAVENOUS
  Filled 2017-08-02: qty 10

## 2017-08-02 MED ORDER — HEPARIN SOD (PORK) LOCK FLUSH 100 UNIT/ML IV SOLN
INTRAVENOUS | Status: AC
  Filled 2017-08-02: qty 5

## 2017-08-02 MED ORDER — HEPARIN SOD (PORK) LOCK FLUSH 100 UNIT/ML IV SOLN
500.0000 [IU] | Freq: Once | INTRAVENOUS | Status: AC
  Administered 2017-08-02: 500 [IU] via INTRAVENOUS

## 2017-08-02 NOTE — Patient Instructions (Signed)
[place] at [facility] Discharge Instructions  RECOMMENDATIONS MADE BY THE CONSULTANT AND ANY TEST RESULTS WILL BE SENT TO YOUR REFERRING PHYSICIAN.  Portacath flushed per protocol today. Follow-up as scheduled. Call clinic for any questions or concerns  Thank you for choosing [place] at [facility] to provide your oncology and hematology care.  To afford each patient quality time with our provider, please arrive at least 15 minutes before your scheduled appointment time.    If you have a lab appointment with the [facility] please come in thru the  Main Entrance and check in at the main information desk  You need to re-schedule your appointment should you arrive 10 or more minutes late.  We strive to give you quality time with our providers, and arriving late affects you and other patients whose appointments are after yours.  Also, if you no show three or more times for appointments you may be dismissed from the clinic at the providers discretion.     Again, thank you for choosing [facility].  Our hope is that these requests will decrease the amount of time that you wait before being seen by our physicians.       _____________________________________________________________  Should you have questions after your visit to [facility], please contact our office at [phone] between the hours of 8:30 a.m. and 4:30 p.m.  Voicemails left after 4:30 p.m. will not be returned until the following business day.  For prescription refill requests, have your pharmacy contact our office.       Resources For Cancer Patients and their Caregivers ? American Cancer Society: Can assist with transportation, wigs, general needs, runs Look Good Feel Better.        502-323-9011 ? Cancer Care: Provides financial assistance, online support groups, medication/co-pay assistance.  1-800-813-HOPE 219 196 2536) ? [facility] Assists [place] cancer patients and their families through emotional , educational and financial support.  516-855-9854 ? [place] DSS Where to apply for food stamps, Medicaid and utility assistance. 639-561-8060 ? [employer]: Transportation to medical appointments. 346-333-7693 ? Social Security Administration: May apply for disability if have a Stage IV cancer. 860-047-2943 972-855-3898 ? [employer], Disability and Transit Services: Assists with nutrition, care and transit needs. [place] Support Programs: @10RELATIVEDAYS @ > Cancer Support Group  2nd Tuesday of the month 1pm-2pm, Journey Room  > Creative Journey  3rd Tuesday of the month 1130am-1pm, Journey Room  > Look Good Feel Better  1st Wednesday of the month 10am-12 noon, Journey Room (Call [place] to register [phone])

## 2017-08-02 NOTE — Progress Notes (Signed)
Geniece Gerety tolerated portacath flush well without complaints or incident. Port accessed with 20 gauge needle with blood return noted then flushed with 10 ml NS and 5 ml Heparin easily per protocol then de-accessed.Pt discharged self ambulatory in satisfactory condition accompanied by family member

## 2017-08-02 NOTE — Progress Notes (Addendum)
HEMATOLOGY ONCOLOGY PROGRESS NOTE  Date of service: .08/02/2017  Patient Care Team: Patient, No Pcp Per as PCP - General (General Practice)   CC: f/u for Ovarian Cancer  SUMMARY OF ONCOLOGIC HISTORY:   Ovarian cancer ([place])   04/25/2015 - 04/28/2015 Hospital Admission    Nausea/diarrhea.  Oncology consult completed on 04/27/2015.      04/25/2015 Tumor Marker    CA 125- 3902.      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      04/27/2015 Procedure    US Paracentesis- A total of approximately 3700 mL of amber colored fluid was removed. A fluid sample was sent for laboratory analysis.      04/27/2015 Imaging    CT Chest- Mild left supraclavicular lymphadenopathy and moderate mediastinal lymphadenopathy involving the prevascular, subcarinal and bilateral pericardiophrenic nodal chains, likely metastatic.      04/27/2015 Pathology Results    Diagnosis PERITONEAL/ASCITIC FLUID(SPECIMEN 1 OF 1 COLLECTED 04/27/15): MALIGNANT CELLS CONSISTENT WITH METASTATIC ADENOCARCINOMA.  The immunophenotype is most consistent with a gynecologic primary, most likely ovary.      05/08/2015 Miscellaneous    Seen by Dr. Denman George- recommending Carboplatin/Paclitaxel x 3 cycles and return visit to see her (within 1 week of administration of third cycle) to evaluate for optimal sequencing of treatment modalities.      05/13/2015 - 05/15/2015 Hospital Admission    Hospitalized for AKI      05/19/2015 - 07/01/2015 Chemotherapy    Carboplatin/Paclitaxel x 3 cycles      07/28/2015 Surgery    Exploratory laparotomy with total abdominal hysterectomy, bilateral salpingo-oophorectomy, omentectomy radical tumor debulking for ovarian cancer; by Dr. Everitt Amber.      07/28/2015 Pathology Results    Uterus +/- tubes/ovaries, neoplastic, cervix - HIGH GRADE SEROUS  CARCINOMA INVOLVING LEFT OVARY, LEFT FALLOPIAN TUBE AND RIGHT FALLOPIAN TUBE. - CERVIX, ENDOMETRIUM AND MYOMETRIUM ARE FREE OF TUMOR. 2. Cul-de-sac biop...      08/26/2015 -  Chemotherapy    Carboplatin/Paclitaxel x 3 cycles      12/23/2015 Miscellaneous    Genetic counseling.  Genetic testing was normal, and did not reveal a deleterious mutation in these genes      03/07/2017 Imaging    CT C/A/P IMPRESSION: Chest Impression:  No evidence of metastatic disease in thorax.  Abdomen / Pelvis Impression:  1. Interval increase in size of solitary nodular peritoneal implant in the LEFT upper quadrant . 2. No additional evidence of peritoneal nodularity. 3. No ascites.       INTERVAL HISTORY:  Patient presents with her son Jeneen Rinks for continue follow-up today.  She states that overall she has been doing well.  She denies any changes in her appetite or energy level.  She denies any chest pain, shortness of breath, abdominal pain, abdominal bloating, nausea, vomiting, diarrhea, focal weakness.  REVIEW OF SYSTEMS:    12 Point review of systems of done and is negative except as noted above.  . Past Medical History:  Diagnosis Date  . Arthritis   . B12 deficiency   . Cataract   . History of blood transfusion   . History of chemotherapy   . Hypertension   . Iron deficiency anemia   . Mixed hyperlipidemia   . Ovarian cancer ([place])   . 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 Schizophrenia ([place])   .  Ulcerative colitis ([place])   . Vitamin D deficiency     . Past Surgical History:  Procedure Laterality Date  . PARACENTESIS  04/27/15  . PORTACATH PLACEMENT Right 04/2015  . REPLACEMENT TOTAL KNEE     right knee in 2003    . Social History   Tobacco Use  . Smoking status: Never Smoker  . Smokeless tobacco: Never Used  Substance Use Topics  . Alcohol use: No  . Drug use: No    ALLERGIES:  has No Known Allergies.  MEDICATIONS:  Current Outpatient Medications  Medication Sig Dispense  Refill  . acetaminophen (TYLENOL) 500 MG tablet Take 2 tablets (1,000 mg total) by mouth every 12 (twelve) hours. 30 tablet 0  . amLODipine (NORVASC) 10 MG tablet Take 10 mg by mouth every evening.     Marland Kitchen aspirin EC 81 MG tablet Take 81 mg by mouth daily.    . Cholecalciferol (VITAMIN D-3) 1000 UNITS CAPS Take 1,000 Units by mouth daily.     Marland Kitchen docusate sodium (COLACE) 100 MG capsule Take 100 mg by mouth 2 (two) times daily.    . folic acid (FOLVITE) 1 MG tablet Take 1 mg by mouth daily.    Marland Kitchen lovastatin (MEVACOR) 10 MG tablet Take 10 mg by mouth at bedtime.    . metoprolol tartrate (LOPRESSOR) 25 MG tablet Take 1 tablet (25 mg total) by mouth 2 (two) times daily. 60 tablet 6  . mirtazapine (REMERON) 15 MG tablet Take 7.5 mg by mouth at bedtime.    . Multiple Vitamin (MULTIVITAMIN WITH MINERALS) TABS tablet Take 1 tablet by mouth daily.    . ondansetron (ZOFRAN) 4 MG tablet Take 1 tablet (4 mg total) by mouth every 6 (six) hours as needed for nausea. 20 tablet 0  . QUEtiapine (SEROQUEL XR) 300 MG 24 hr tablet Take 300 mg by mouth at bedtime.    . sulfaSALAzine (AZULFIDINE) 500 MG tablet Take 500 mg by mouth 3 (three) times daily.    . traMADol (ULTRAM) 50 MG tablet Take 1 tablet (50 mg total) by mouth every 6 (six) hours as needed. 90 tablet 1  . vitamin B-12 (CYANOCOBALAMIN) 1000 MCG tablet Take 1,000 mcg by mouth daily.     No current facility-administered medications for this visit.     PHYSICAL EXAMINATION: ECOG PERFORMANCE STATUS: 1 - Symptomatic but completely ambulatory  . Vitals:   08/02/17 1035  BP: (!) 166/74  Pulse: 65  Resp: 18  Temp: 98 F (36.7 C)  SpO2: 99%    Filed Weights   08/02/17 1035  Weight: 158 lb 4.8 oz (71.8 kg)   .Body mass index is 25.55 kg/m.  Constitutional: Well-developed, well-nourished, and in no distress.   HENT:  Head: Normocephalic and atraumatic.  Mouth/Throat: No oropharyngeal exudate. Mucosa moist. Eyes: Pupils are equal, round, and  reactive to light. Conjunctivae are normal. No scleral icterus.  Neck: Normal range of motion. Neck supple. No JVD present.  Cardiovascular: Normal rate, regular rhythm and normal heart sounds.  Exam reveals no gallop and no friction rub.   No murmur heard. Pulmonary/Chest: Effort normal and breath sounds normal. No respiratory distress. No wheezes.No rales.  Abdominal: Soft. Bowel sounds are normal. No distension. There is no tenderness. There is no guarding.  Musculoskeletal: No edema or tenderness.  Lymphadenopathy:    No cervical or supraclavicular adenopathy.  Neurological: Alert and oriented to person, place, and time. No cranial nerve deficit.  Skin: Skin is warm and dry. No  rash noted. No erythema. No pallor.  Psychiatric: Affect and judgment normal.    LABORATORY DATA:   I have reviewed the data as listed  . CBC Latest Ref Rng & Units 07/27/2017 03/03/2017 01/06/2017  WBC 4.0 - 10.5 K/uL 4.2 4.8 5.0  Hemoglobin 12.0 - 15.0 g/dL 11.6(L) 10.9(L) 11.3(L)  Hematocrit 36.0 - 46.0 % 34.9(L) 32.7(L) 33.7(L)  Platelets 150 - 400 K/uL 286 252 276   . CBC    Component Value Date/Time   WBC 4.2 07/27/2017 0807   RBC 3.32 (L) 07/27/2017 0807   HGB 11.6 (L) 07/27/2017 0807   HCT 34.9 (L) 07/27/2017 0807   PLT 286 07/27/2017 0807   MCV 105.1 (H) 07/27/2017 0807   MCH 34.9 (H) 07/27/2017 0807   MCHC 33.2 07/27/2017 0807   RDW 13.3 07/27/2017 0807   LYMPHSABS 1.1 07/27/2017 0807   MONOABS 0.5 07/27/2017 0807   EOSABS 0.3 07/27/2017 0807   BASOSABS 0.0 07/27/2017 0807    CMP Latest Ref Rng & Units 07/27/2017 03/03/2017 01/06/2017  Glucose 65 - 99 mg/dL 100(H) 91 86  BUN 6 - 20 mg/dL 10 14 13   Creatinine 0.44 - 1.00 mg/dL 1.01(H) 0.99 0.92  Sodium 135 - 145 mmol/L 142 139 138  Potassium 3.5 - 5.1 mmol/L 3.8 3.8 4.0  Chloride 101 - 111 mmol/L 104 106 102  CO2 22 - 32 mmol/L 31 26 28   Calcium 8.9 - 10.3 mg/dL 9.1 9.1 9.3  Total Protein 6.5 - 8.1 g/dL 7.1 7.1 7.0  Total Bilirubin  0.3 - 1.2 mg/dL 0.7 0.7 0.5  Alkaline Phos 38 - 126 U/L 79 73 75  AST 15 - 41 U/L 29 30 28   ALT 14 - 54 U/L 28 31 28    . Lab Results  Component Value Date   CA125 47.3 (H) 03/03/2017     RADIOGRAPHIC STUDIES: I have personally reviewed the radiological images as listed and agreed with the findings in the report. Ct Chest W Contrast  Result Date: 07/31/2017 CLINICAL DATA:  Ovarian cancer. EXAM: CT CHEST, ABDOMEN, AND PELVIS WITH CONTRAST TECHNIQUE: Multidetector CT imaging of the chest, abdomen and pelvis was performed following the standard protocol during bolus administration of intravenous contrast. CONTRAST:  134m ISOVUE-300 IOPAMIDOL (ISOVUE-300) INJECTION 61% COMPARISON:  03/07/2017. FINDINGS: CT CHEST FINDINGS Cardiovascular: The heart size is normal. No pericardial effusion. Right Port-A-Cath tip is positioned in the mid to distal SVC. Mediastinum/Nodes: No mediastinal lymphadenopathy. There is no hilar lymphadenopathy. The esophagus has normal imaging features. There is no axillary lymphadenopathy. Lungs/Pleura: 4 mm subpleural right lower lobe pulmonary nodule is unchanged (image 113 series 4). Tiny subpleural nodule left apex measures about 1 mm and is stable. Musculoskeletal: Bone windows reveal no worrisome lytic or sclerotic osseous lesions. CT ABDOMEN PELVIS FINDINGS Hepatobiliary: No focal abnormality within the liver parenchyma. There is no evidence for gallstones, gallbladder wall thickening, or pericholecystic fluid. Mild extrahepatic common duct distention up to about 10 mm diameter is stable. Common bile duct in the head of the pancreas is nondilated. Pancreas: No focal mass lesion. No dilatation of the main duct. No intraparenchymal cyst. No peripancreatic edema. Spleen: No splenomegaly. No focal mass lesion. Adrenals/Urinary Tract: No adrenal nodule or mass. Kidneys are unremarkable. No evidence for hydroureter. The urinary bladder appears normal for the degree of distention.  Stomach/Bowel: Stomach is nondistended. No gastric wall thickening. No evidence of outlet obstruction. Duodenum is normally positioned as is the ligament of Treitz. No small bowel wall thickening. No small  bowel dilatation. The terminal ileum is normal. The appendix is normal. No gross colonic mass. No colonic wall thickening. No substantial diverticular change. Vascular/Lymphatic: No abdominal aortic aneurysm. 7 mm short axis hepatoduodenal ligament lymph node has decreased from 16 mm on the prior study. No retroperitoneal lymphadenopathy. No pelvic sidewall lymphadenopathy. Reproductive: Uterus surgically absent.  There is no adnexal mass. Other: No intraperitoneal free fluid. 14 x 15 mm nodule between the left adrenal gland in the pancreas has progressed in the interval from 6 x 8 mm on the prior study. Previously measured 14 x 18 mm nodule between the stomach in the spleen has progressed, now measuring 20 x 26 mm. 5 mm right perirectal nodule is new in the interval. Small nodules measuring about 8 mm are seen in the posterior mesentery of the left pelvis (image 84 series 3), new in the interval. Musculoskeletal: Bone windows reveal no worrisome lytic or sclerotic osseous lesions. IMPRESSION: 1. Left upper quadrant nodule continues to progress, now measuring 20 x 26 mm. 2. New nodule identified between the in adrenal gland, concerning for metastatic deposit. 3. Interval development of tiny nodules in the left lower quadrant mesenteric, concerning for metastases. 4. No ascites. 5. Status post total abdominal hysterectomy and omentectomy. Electronically Signed   By: Misty Stanley M.D.   On: 07/31/2017 15:15   Ct Abdomen Pelvis W Contrast  Result Date: 07/31/2017 CLINICAL DATA:  Ovarian cancer. EXAM: CT CHEST, ABDOMEN, AND PELVIS WITH CONTRAST TECHNIQUE: Multidetector CT imaging of the chest, abdomen and pelvis was performed following the standard protocol during bolus administration of intravenous contrast.  CONTRAST:  12m ISOVUE-300 IOPAMIDOL (ISOVUE-300) INJECTION 61% COMPARISON:  03/07/2017. FINDINGS: CT CHEST FINDINGS Cardiovascular: The heart size is normal. No pericardial effusion. Right Port-A-Cath tip is positioned in the mid to distal SVC. Mediastinum/Nodes: No mediastinal lymphadenopathy. There is no hilar lymphadenopathy. The esophagus has normal imaging features. There is no axillary lymphadenopathy. Lungs/Pleura: 4 mm subpleural right lower lobe pulmonary nodule is unchanged (image 113 series 4). Tiny subpleural nodule left apex measures about 1 mm and is stable. Musculoskeletal: Bone windows reveal no worrisome lytic or sclerotic osseous lesions. CT ABDOMEN PELVIS FINDINGS Hepatobiliary: No focal abnormality within the liver parenchyma. There is no evidence for gallstones, gallbladder wall thickening, or pericholecystic fluid. Mild extrahepatic common duct distention up to about 10 mm diameter is stable. Common bile duct in the head of the pancreas is nondilated. Pancreas: No focal mass lesion. No dilatation of the main duct. No intraparenchymal cyst. No peripancreatic edema. Spleen: No splenomegaly. No focal mass lesion. Adrenals/Urinary Tract: No adrenal nodule or mass. Kidneys are unremarkable. No evidence for hydroureter. The urinary bladder appears normal for the degree of distention. Stomach/Bowel: Stomach is nondistended. No gastric wall thickening. No evidence of outlet obstruction. Duodenum is normally positioned as is the ligament of Treitz. No small bowel wall thickening. No small bowel dilatation. The terminal ileum is normal. The appendix is normal. No gross colonic mass. No colonic wall thickening. No substantial diverticular change. Vascular/Lymphatic: No abdominal aortic aneurysm. 7 mm short axis hepatoduodenal ligament lymph node has decreased from 16 mm on the prior study. No retroperitoneal lymphadenopathy. No pelvic sidewall lymphadenopathy. Reproductive: Uterus surgically absent.   There is no adnexal mass. Other: No intraperitoneal free fluid. 14 x 15 mm nodule between the left adrenal gland in the pancreas has progressed in the interval from 6 x 8 mm on the prior study. Previously measured 14 x 18 mm nodule between the stomach in  the spleen has progressed, now measuring 20 x 26 mm. 5 mm right perirectal nodule is new in the interval. Small nodules measuring about 8 mm are seen in the posterior mesentery of the left pelvis (image 84 series 3), new in the interval. Musculoskeletal: Bone windows reveal no worrisome lytic or sclerotic osseous lesions. IMPRESSION: 1. Left upper quadrant nodule continues to progress, now measuring 20 x 26 mm. 2. New nodule identified between the in adrenal gland, concerning for metastatic deposit. 3. Interval development of tiny nodules in the left lower quadrant mesenteric, concerning for metastases. 4. No ascites. 5. Status post total abdominal hysterectomy and omentectomy. Electronically Signed   By: Misty Stanley M.D.   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 CT C/A/P11/12/18 demonstrated progressive disease with increase in size of previous LUQ peritoneal nodule, new left adrenal nodule and new perirectal nodule.   -I have reviewed her CT C/A/P results with her and her son in detail. I have let her know that she has progressive disease. I have sent a message to Dr. Denman George for her to review her scans and to decide whats the next plan of care for her, whether it would be to proceed with debulking surgery vs. Starting chemo again. I will call the patient back to let her know the plan of care. -Port flush today. Dispo: TBD based on my discussion with Dr. Denman George.  Twana First, MD  ADDENDUM: Discussed case with Dr. Denman George who recommended that patient proceed with chemo with carbo/taxol. She did not feel like debulking surgery would be of benefit for  the patient. We will notify patient regarding coming back in to discuss chemo.  Twana First, MD 08/09/17

## 2017-08-09 ENCOUNTER — Other Ambulatory Visit (HOSPITAL_COMMUNITY): Payer: Self-pay | Admitting: Oncology

## 2017-08-14 ENCOUNTER — Encounter: Payer: Self-pay | Admitting: Gynecologic Oncology

## 2017-08-14 ENCOUNTER — Ambulatory Visit: Payer: Medicare Other | Attending: Gynecologic Oncology | Admitting: Gynecologic Oncology

## 2017-08-14 VITALS — BP 149/64 | HR 65 | Temp 97.8°F | Resp 18 | Wt 159.7 lb

## 2017-08-14 DIAGNOSIS — I10 Essential (primary) hypertension: Secondary | ICD-10-CM | POA: Diagnosis not present

## 2017-08-14 DIAGNOSIS — Z9221 Personal history of antineoplastic chemotherapy: Secondary | ICD-10-CM | POA: Insufficient documentation

## 2017-08-14 DIAGNOSIS — C569 Malignant neoplasm of unspecified ovary: Secondary | ICD-10-CM | POA: Diagnosis present

## 2017-08-14 DIAGNOSIS — F209 Schizophrenia, unspecified: Secondary | ICD-10-CM | POA: Insufficient documentation

## 2017-08-14 DIAGNOSIS — C786 Secondary malignant neoplasm of retroperitoneum and peritoneum: Secondary | ICD-10-CM | POA: Diagnosis not present

## 2017-08-14 DIAGNOSIS — Z79899 Other long term (current) drug therapy: Secondary | ICD-10-CM | POA: Insufficient documentation

## 2017-08-14 DIAGNOSIS — Z7982 Long term (current) use of aspirin: Secondary | ICD-10-CM | POA: Diagnosis not present

## 2017-08-14 NOTE — Patient Instructions (Signed)
Dr Denman George is recommending that you restart chemotherapy with Carboplatin and Paclitaxel for at least 6 doses. After you have completed this, Dr Denman George is recommending then taking tablet PARP inhibitor (Niraparib) until the next time that the cancer recur's.  Please schedule an appointment to see Dr Talbert Cage to restart chemotherapy.   You have received genetic testing in the cancer center and did not have a gene mutation.

## 2017-08-14 NOTE — Progress Notes (Signed)
Followup Note: Gyn-Onc  Larina Bras 76 y.o. 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 Ovarian cancer, unspecified laterality [facility])    Assessment/Plan:  Ms. Arthurine Oleary  is a 76 y.o.  year old with recurrent stage IVB ovarian cancer, with peritoneal recurrence, 20 months s/p completion of primary platinum chemotherapy. BRCA negative on testing.  I am recommending salvage chemotherapy with platinum and taxane for at least 6 cycles. Following that, I recommend PARP inhibitor maintenance with Niraparib.  HPI: Alysandra Lobue is a very pleasant 76 year old G1 who is seen in consultation at the request of Dr Whitney Muse for (clinical) stage IVB ovarian cancer. The patient developed symptoms of progressive abdominal fullness and discomfort over the course of several months. In August, 2016 she presented to the [place] where imaging of the abdomen and pelvis was obtained for diarrhea, nausea and emesis. Imaging on 04/25/15 revealed large volume ascites, carcinomatosis and peritoneal masses measuring up to 5.1cm. The patient underwent paracentesis with cytology on 04/27/15 which revealed metastatic adenocarcinoma consistent on immunostains with gyn primary.  CT of the chest on 04/27/15 revealed mild left supraclavicular lymphadenopathy, and mediastinal lymphadenopathy consistent with metastatic disease. There were <71m pulmonary nodules also seen which were indeterminant. There were trace pleural effusions seen.   CA 125 on 04/25/15: 3902.  She then went on to receive 3 cycles of paclitaxel and carboplatin chemotherapy with Dr PWhitney Muse Cycle 3 was on 07/01/15.  CA 125 on 07/01/15 was 249.  CT scan on 07/20/15 showed: Previously noted ascites has resolved. No pneumoperitoneum. Many of the previously noted peritoneal implants are no longer confidently identified on today's examination. There are few smaller implants noted, the  largest of which is in th  left side of the abdomen anteriorly measuring 9 x 11 mm. There is also a 10 x 7 mm lesion superficial to the splenic flexure of the colon which is substantially smaller than the prior study (previously 2.8 x 1.7 cm).  There was resolution of the chest adenopathy.  She has tolerated chemotherapy well with a good appetites and good general function.  On 07/31/15 she was taken to the OR for an ex lap, TAH, BSO, omentectomy, radical tumor debulking. Intraoperative findings included: Excellent response to neoadjuvant chemotherapy. Small volume plaques/nodules in left and right posterior cul de sac behind uterus, tumor on left ovary (3cm), multiple 2cm nodules in omentum. No other peritoneal disease. Diaphragm normal.  She had an optimal cytoreduction (R0) with no gross visible disease remaining.  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  Postoperatively she did very well with no postoperative complications.  She went on to receive 3 additional cycles of carboplatin and paclitaxel completed April, 2017. She tolerated therapy very well.  Post treatment scans were unremarkable and showed no residual disease.  Her CA 125 was monitored at 3 monthly intervals. In January, 2018 it was normal at 17. On April, 20th ,2018 it had increased to 32. On June 15th, 2018 it had increased again to 47. CT chest/abdo/pelvis on 03/07/17 showed solitary pertoneal nodule in the gastrosplenic ligament measuring 189mx 162mincreased from a prior scan where it was 1cm). No additional nodularity is noted nor ascites.  She was offered secondary cytoreduction surgery followed by chemotherapy, however she declined this and opted for expectant management because she felt good.  Interval Hx:  On 07/31/17 repeat CT abdo/pelvis was  performed. It showed progression of peritoneal disease with an enlargement of a peri-splenic lesion, a new nodule adjacent to the  adrenal gland and a new left lower quadrant peritoneal metastases.  She continues to feel well with no abdominal symptoms and an excellent performance status (ECOG 0).   Current Meds:  Outpatient Encounter Medications as of 08/14/2017  Medication Sig  . acetaminophen (TYLENOL) 500 MG tablet Take 2 tablets (1,000 mg total) by mouth every 12 (twelve) hours.  Marland Kitchen amLODipine (NORVASC) 10 MG tablet Take 10 mg by mouth every evening.   Mar[place] Kitchen aspirin EC 81 MG tablet Take 81 mg by mouth daily.  . Cholecalciferol (VITAMIN D-3) 1000 UNITS CAPS Take 1,000 Units by mouth daily.   Marland Kitchen docusate sodium (COLACE) 100 MG capsule Take 100 mg by mouth 2 (two) times daily.  . folic acid (FOLVITE) 1 MG tablet Take 1 mg by mouth daily.  Marland Kitchen lovastatin (MEVACOR) 10 MG tablet Take 10 mg by mouth at bedtime.  . metoprolol tartrate (LOPRESSOR) 25 MG tablet Take 1 tablet (25 mg total) by mouth 2 (two) times daily.  . mirtazapine (REMERON) 15 MG tablet Take 7.5 mg by mouth at bedtime.  . Multiple Vitamin (MULTIVITAMIN WITH MINERALS) TABS tablet Take 1 tablet by mouth daily.  . ondansetron (ZOFRAN) 4 MG tablet Take 1 tablet (4 mg total) by mouth every 6 (six) hours as needed for nausea.  Marland Kitchen QUEtiapine (SEROQUEL XR) 300 MG 24 hr tablet Take 300 mg by mouth at bedtime.  . sulfaSALAzine (AZULFIDINE) 500 MG tablet Take 500 mg by mouth 3 (three) times daily.  . traMADol (ULTRAM) 50 MG tablet Take 1 tablet (50 mg total) by mouth every 6 (six) hours as needed.  . vitamin B-12 (CYANOCOBALAMIN) 1000 MCG tablet Take 1,000 mcg by mouth daily.   No facility-administered encounter medications on file as of 08/14/2017.     Allergy: No Known Allergies  Social Hx:   Social History   Socioeconomic History  . Marital status: Single    Spouse name: Not on file  . Number of children: 1  . Years of education: Not on file  . Highest education level: Not on file  Social Needs  . Financial resource strain: Not on file  . Food insecurity -  worry: Not on file  . Food insecurity - inability: Not on file  . Transportation needs - medical: Not on file  . Transportation needs - non-medical: Not on file  Occupational History  . Not on file  Tobacco Use  . Smoking status: Never Smoker  . Smokeless tobacco: Never Used  Substance and Sexual Activity  . Alcohol use: No  . Drug use: No  . Sexual activity: Not Currently  Other Topics Concern  . Not on file  Social History Narrative  . Not on file    Past Surgical Hx:  Past Surgical History:  Procedure Laterality Date  . ABDOMINAL HYSTERECTOMY N/A 07/28/2015   Procedure: TOTAL HYSTERECTOMY ABDOMINAL BILATERAL SALPINGO OOPHRORECTOMY;  Surgeon: Everitt Amber, MD;  Location: [facility];  Service: Gynecology;  Laterality: N/A;  . DEBULKING N/A 07/28/2015   Procedure: DEBULKING;  Surgeon: Everitt Amber, MD;  Location: [facility];  Service: Gynecology;  Laterality: N/A;  . LAPAROTOMY N/A 07/28/2015   Procedure: EXPLORATORY LAPAROTOMY;  Surgeon: Everitt Amber, MD;  Location: [facility];  Service: Gynecology;  Laterality: N/A;  . OMENTECTOMY N/A 07/28/2015   Procedure: OMENTECTOMY ;  Surgeon: Everitt Amber, MD;  Location: [facility];  Service: Gynecology;  Laterality: N/A;  . PARACENTESIS  04/27/15  . PORTACATH PLACEMENT Right 04/2015  . REPLACEMENT TOTAL KNEE     right knee in 2003    Past Medical Hx:  Past Medical History:  Diagnosis Date  . Arthritis   . B12 deficiency   . Cataract   . History of blood transfusion   . History of chemotherapy   . Hypertension   . Iron deficiency anemia   . Mixed hyperlipidemia   . Ovarian cancer ([place])   . 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 Schizophrenia ([place])   . Ulcerative colitis ([place])   . Vitamin D deficiency     Past Gynecological History:  SVD x 1  No LMP recorded. Patient is postmenopausal.  Family Hx:  Family History  Problem Relation Age of Onset  . Prostate cancer Brother   . Cancer Paternal Uncle        1 uncle with cancer NOS  . Lung cancer Maternal  Grandmother   . Hypertension Maternal Grandfather   . Congestive Heart Failure Mother   . Seizures Sister     Review of Systems:  Constitutional  Feels well,    ENT Normal appearing ears and nares bilaterally Skin/Breast  No rash, sores, jaundice, itching, dryness Cardiovascular  No chest pain, shortness of breath, or edema  Pulmonary  No cough or wheeze.  Gastro Intestinal  No nausea, vomitting, or diarrhoea. No bright red blood per rectum, no abdominal pain, change in bowel movement, or constipation.  Genito Urinary  No frequency, urgency, dysuria, no postmenopausal bleeding Musculo Skeletal  No myalgia, arthralgia, joint swelling or pain  Neurologic  No weakness, numbness, change in gait,  Psychology  No depression, anxiety, insomnia.   Vitals:  Blood pressure (!) 149/64, pulse 65, temperature 97.8 F (36.6 C), temperature source Oral, resp. rate 18, weight 159 lb 11.2 oz (72.4 kg), SpO2 97 %.  Physical Exam: WD in NAD Neck  Supple NROM, without any enlargements.  Lymph Node Survey No cervical supraclavicular or inguinal adenopathy Cardiovascular  Pulse normal rate, regularity and rhythm. S1 and S2 normal.  Lungs  Clear to auscultation bilateraly, without wheezes/crackles/rhonchi. Good air movement.  Skin  No rash/lesions/breakdown  Psychiatry  Alert and oriented to person, place, and time  Abdomen  Normoactive bowel sounds, abdomen soft, non distended, non-tender and thin without evidence of hernia.  Incision well healed. No palpable masses. Back No CVA tenderness Genito Urinary  Vaginal cuff normal with no lesions. In tact vagina.No bleeding. Surgically absent uterus and cervix. Extremities  No bilateral cyanosis, clubbing or edema.   [name], MD   08/14/2017, 6:04 PM

## 2017-08-16 ENCOUNTER — Other Ambulatory Visit (HOSPITAL_COMMUNITY): Payer: Self-pay | Admitting: Oncology

## 2017-08-16 ENCOUNTER — Encounter (HOSPITAL_BASED_OUTPATIENT_CLINIC_OR_DEPARTMENT_OTHER): Payer: Medicare Other | Admitting: Oncology

## 2017-08-16 ENCOUNTER — Encounter (HOSPITAL_COMMUNITY): Payer: Self-pay | Admitting: Emergency Medicine

## 2017-08-16 ENCOUNTER — Encounter (HOSPITAL_COMMUNITY): Payer: Self-pay | Admitting: Oncology

## 2017-08-16 ENCOUNTER — Other Ambulatory Visit: Payer: Self-pay

## 2017-08-16 VITALS — BP 168/81 | HR 65 | Temp 97.5°F | Resp 18 | Wt 159.6 lb

## 2017-08-16 DIAGNOSIS — C569 Malignant neoplasm of unspecified ovary: Secondary | ICD-10-CM

## 2017-08-16 DIAGNOSIS — Z9221 Personal history of antineoplastic chemotherapy: Secondary | ICD-10-CM

## 2017-08-16 DIAGNOSIS — C786 Secondary malignant neoplasm of retroperitoneum and peritoneum: Secondary | ICD-10-CM

## 2017-08-16 MED ORDER — PROCHLORPERAZINE MALEATE 10 MG PO TABS: 10.0000 mg | ORAL_TABLET | Freq: Four times a day (QID) | ORAL | 1 refills | Status: DC | PRN

## 2017-08-16 MED ORDER — ONDANSETRON HCL 8 MG PO TABS: 8.0000 mg | ORAL_TABLET | Freq: Two times a day (BID) | ORAL | 1 refills | Status: AC | PRN

## 2017-08-16 MED ORDER — LIDOCAINE-PRILOCAINE 2.5-2.5 % EX CREA: TOPICAL_CREAM | CUTANEOUS | 3 refills | Status: DC

## 2017-08-16 MED ORDER — DEXAMETHASONE 4 MG PO TABS: 8.0000 mg | ORAL_TABLET | Freq: Every day | ORAL | 1 refills | Status: DC

## 2017-08-16 NOTE — Addendum Note (Signed)
Addended by: Joanne Gavel T on: 08/16/2017 09:51 AM   Modules accepted: Orders

## 2017-08-16 NOTE — Progress Notes (Signed)
HEMATOLOGY ONCOLOGY PROGRESS NOTE  Date of service: .08/16/2017  Patient Care Team: Patient, No Pcp Per as PCP - General (General Practice)   CC: f/u for Ovarian Cancer  SUMMARY OF ONCOLOGIC HISTORY:   Ovarian cancer ([place])   04/25/2015 - 04/28/2015 Hospital Admission    Nausea/diarrhea.  Oncology consult completed on 04/27/2015.      04/25/2015 Tumor Marker    CA 125- 3902.      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      04/27/2015 Procedure    US Paracentesis- A total of approximately 3700 mL of amber colored fluid was removed. A fluid sample was sent for laboratory analysis.      04/27/2015 Imaging    CT Chest- Mild left supraclavicular lymphadenopathy and moderate mediastinal lymphadenopathy involving the prevascular, subcarinal and bilateral pericardiophrenic nodal chains, likely metastatic.      04/27/2015 Pathology Results    Diagnosis PERITONEAL/ASCITIC FLUID(SPECIMEN 1 OF 1 COLLECTED 04/27/15): MALIGNANT CELLS CONSISTENT WITH METASTATIC ADENOCARCINOMA.  The immunophenotype is most consistent with a gynecologic primary, most likely ovary.      05/08/2015 Miscellaneous    Seen by Dr. Denman George- recommending Carboplatin/Paclitaxel x 3 cycles and return visit to see her (within 1 week of administration of third cycle) to evaluate for optimal sequencing of treatment modalities.      05/13/2015 - 05/15/2015 Hospital Admission    Hospitalized for AKI      05/19/2015 - 07/01/2015 Chemotherapy    Carboplatin/Paclitaxel x 3 cycles      07/28/2015 Surgery    Exploratory laparotomy with total abdominal hysterectomy, bilateral salpingo-oophorectomy, omentectomy radical tumor debulking for ovarian cancer; by Dr. Everitt Amber.      07/28/2015 Pathology Results    Uterus +/- tubes/ovaries, neoplastic, cervix - HIGH GRADE SEROUS  CARCINOMA INVOLVING LEFT OVARY, LEFT FALLOPIAN TUBE AND RIGHT FALLOPIAN TUBE. - CERVIX, ENDOMETRIUM AND MYOMETRIUM ARE FREE OF TUMOR. 2. Cul-de-sac biop...      08/26/2015 -  Chemotherapy    Carboplatin/Paclitaxel x 3 cycles      12/23/2015 Miscellaneous    Genetic counseling.  Genetic testing was normal, and did not reveal a deleterious mutation in these genes      03/07/2017 Imaging    CT C/A/P IMPRESSION: Chest Impression:  No evidence of metastatic disease in thorax.  Abdomen / Pelvis Impression:  1. Interval increase in size of solitary nodular peritoneal implant in the LEFT upper quadrant . 2. No additional evidence of peritoneal nodularity. 3. No ascites.      07/31/2017 Progression    CT C/A/P: IMPRESSION: 1. Left upper quadrant nodule continues to progress, now measuring 20 x 26 mm. 2. New nodule identified between the in adrenal gland, concerning for metastatic deposit. 3. Interval development of tiny nodules in the left lower quadrant mesenteric, concerning for metastases. 4. No ascites. 5. Status post total abdominal hysterectomy and omentectomy.       INTERVAL HISTORY:  Patient presents with her son Jeneen Rinks for continue follow-up today.  She states that overall she has been doing well. She has seen Dr. Denman George in follow up on 08/14/17 for her ovarian cancer. She denies any changes in her appetite or energy level.  She denies any chest pain, shortness of breath, abdominal pain, abdominal bloating, nausea, vomiting, diarrhea, focal weakness.   REVIEW OF SYSTEMS:  12 Point review of systems of done and is negative except as noted above.  . Past Medical History:  Diagnosis Date  . Arthritis   . B12 deficiency   . Cataract   . History of blood transfusion   . History of chemotherapy   . Hypertension   . Iron deficiency anemia   . Mixed hyperlipidemia   . Ovarian cancer ([place])   . 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)   . Schizophrenia ([place])   . Ulcerative colitis  ([place])   . Vitamin D deficiency     . Past Surgical History:  Procedure Laterality Date  . ABDOMINAL HYSTERECTOMY N/A 07/28/2015   Procedure: TOTAL HYSTERECTOMY ABDOMINAL BILATERAL SALPINGO OOPHRORECTOMY;  Surgeon: Everitt Amber, MD;  Location: [facility];  Service: Gynecology;  Laterality: N/A;  . DEBULKING N/A 07/28/2015   Procedure: DEBULKING;  Surgeon: Everitt Amber, MD;  Location: [facility];  Service: Gynecology;  Laterality: N/A;  . LAPAROTOMY N/A 07/28/2015   Procedure: EXPLORATORY LAPAROTOMY;  Surgeon: Everitt Amber, MD;  Location: [facility];  Service: Gynecology;  Laterality: N/A;  . OMENTECTOMY N/A 07/28/2015   Procedure: OMENTECTOMY ;  Surgeon: Everitt Amber, MD;  Location: [facility];  Service: Gynecology;  Laterality: N/A;  . PARACENTESIS  04/27/15  . PORTACATH PLACEMENT Right 04/2015  . REPLACEMENT TOTAL KNEE     right knee in 2003    . Social History   Tobacco Use  . Smoking status: Never Smoker  . Smokeless tobacco: Never Used  Substance Use Topics  . Alcohol use: No  . Drug use: No    ALLERGIES:  has No Known Allergies.  MEDICATIONS:  Current Outpatient Medications  Medication Sig Dispense Refill  . acetaminophen (TYLENOL) 500 MG tablet Take 2 tablets (1,000 mg total) by mouth every 12 (twelve) hours. 30 tablet 0  . amLODipine (NORVASC) 10 MG tablet Take 10 mg by mouth every evening.     Marland Kitchen aspirin EC 81 MG tablet Take 81 mg by mouth daily.    . Cholecalciferol (VITAMIN D-3) 1000 UNITS CAPS Take 1,000 Units by mouth daily.     Marland Kitchen docusate sodium (COLACE) 100 MG capsule Take 100 mg by mouth 2 (two) times daily.    . folic acid (FOLVITE) 1 MG tablet Take 1 mg by mouth daily.    Marland Kitchen lovastatin (MEVACOR) 10 MG tablet Take 10 mg by mouth at bedtime.    . metoprolol tartrate (LOPRESSOR) 25 MG tablet Take 1 tablet (25 mg total) by mouth 2 (two) times daily. 60 tablet 6  . mirtazapine (REMERON) 15 MG tablet Take 7.5 mg by mouth at bedtime.    . Multiple Vitamin (MULTIVITAMIN WITH MINERALS) TABS  tablet Take 1 tablet by mouth daily.    . ondansetron (ZOFRAN) 4 MG tablet Take 1 tablet (4 mg total) by mouth every 6 (six) hours as needed for nausea. 20 tablet 0  . QUEtiapine (SEROQUEL XR) 300 MG 24 hr tablet Take 300 mg by mouth at bedtime.    . sulfaSALAzine (AZULFIDINE) 500 MG tablet Take 500 mg by mouth 3 (three) times daily.    . traMADol (ULTRAM) 50 MG tablet Take 1 tablet (50 mg total) by mouth every 6 (six) hours as needed. 90 tablet 1  . vitamin B-12 (CYANOCOBALAMIN) 1000 MCG tablet Take 1,000 mcg by mouth daily.     No current facility-administered medications for this visit.     PHYSICAL EXAMINATION: ECOG PERFORMANCE STATUS: 1 - Symptomatic but completely ambulatory  . There were no vitals  filed for this visit.  There were no vitals filed for this visit. .There is no height or weight on file to calculate BMI.  Constitutional: Well-developed, well-nourished, and in no distress.   HENT:  Head: Normocephalic and atraumatic.  Mouth/Throat: No oropharyngeal exudate. Mucosa moist. Eyes: Pupils are equal, round, and reactive to light. Conjunctivae are normal. No scleral icterus.  Neck: Normal range of motion. Neck supple. No JVD present.  Cardiovascular: Normal rate, regular rhythm and normal heart sounds.  Exam reveals no gallop and no friction rub.   No murmur heard. Pulmonary/Chest: Effort normal and breath sounds normal. No respiratory distress. No wheezes.No rales.  Abdominal: Soft. Bowel sounds are normal. No distension. There is no tenderness. There is no guarding.  Musculoskeletal: No edema or tenderness.  Lymphadenopathy:    No cervical or supraclavicular adenopathy.  Neurological: Alert and oriented to person, place, and time. No cranial nerve deficit.  Skin: Skin is warm and dry. No rash noted. No erythema. No pallor.  Psychiatric: Affect and judgment normal.    LABORATORY DATA:   I have reviewed the data as listed  . CBC Latest Ref Rng & Units 07/27/2017  03/03/2017 01/06/2017  WBC 4.0 - 10.5 K/uL 4.2 4.8 5.0  Hemoglobin 12.0 - 15.0 g/dL 11.6(L) 10.9(L) 11.3(L)  Hematocrit 36.0 - 46.0 % 34.9(L) 32.7(L) 33.7(L)  Platelets 150 - 400 K/uL 286 252 276   . CBC    Component Value Date/Time   WBC 4.2 07/27/2017 0807   RBC 3.32 (L) 07/27/2017 0807   HGB 11.6 (L) 07/27/2017 0807   HCT 34.9 (L) 07/27/2017 0807   PLT 286 07/27/2017 0807   MCV 105.1 (H) 07/27/2017 0807   MCH 34.9 (H) 07/27/2017 0807   MCHC 33.2 07/27/2017 0807   RDW 13.3 07/27/2017 0807   LYMPHSABS 1.1 07/27/2017 0807   MONOABS 0.5 07/27/2017 0807   EOSABS 0.3 07/27/2017 0807   BASOSABS 0.0 07/27/2017 0807    CMP Latest Ref Rng & Units 07/27/2017 03/03/2017 01/06/2017  Glucose 65 - 99 mg/dL 100(H) 91 86  BUN 6 - 20 mg/dL 10 14 13   Creatinine 0.44 - 1.00 mg/dL 1.01(H) 0.99 0.92  Sodium 135 - 145 mmol/L 142 139 138  Potassium 3.5 - 5.1 mmol/L 3.8 3.8 4.0  Chloride 101 - 111 mmol/L 104 106 102  CO2 22 - 32 mmol/L 31 26 28   Calcium 8.9 - 10.3 mg/dL 9.1 9.1 9.3  Total Protein 6.5 - 8.1 g/dL 7.1 7.1 7.0  Total Bilirubin 0.3 - 1.2 mg/dL 0.7 0.7 0.5  Alkaline Phos 38 - 126 U/L 79 73 75  AST 15 - 41 U/L 29 30 28   ALT 14 - 54 U/L 28 31 28    . Lab Results  Component Value Date   CA125 47.3 (H) 03/03/2017     RADIOGRAPHIC STUDIES: I have personally reviewed the radiological images as listed and agreed with the findings in the report. Ct Chest W Contrast  Result Date: 07/31/2017 CLINICAL DATA:  Ovarian cancer. EXAM: CT CHEST, ABDOMEN, AND PELVIS WITH CONTRAST TECHNIQUE: Multidetector CT imaging of the chest, abdomen and pelvis was performed following the standard protocol during bolus administration of intravenous contrast. CONTRAST:  120m ISOVUE-300 IOPAMIDOL (ISOVUE-300) INJECTION 61% COMPARISON:  03/07/2017. FINDINGS: CT CHEST FINDINGS Cardiovascular: The heart size is normal. No pericardial effusion. Right Port-A-Cath tip is positioned in the mid to distal SVC.  Mediastinum/Nodes: No mediastinal lymphadenopathy. There is no hilar lymphadenopathy. The esophagus has normal imaging features. There is no axillary  lymphadenopathy. Lungs/Pleura: 4 mm subpleural right lower lobe pulmonary nodule is unchanged (image 113 series 4). Tiny subpleural nodule left apex measures about 1 mm and is stable. Musculoskeletal: Bone windows reveal no worrisome lytic or sclerotic osseous lesions. CT ABDOMEN PELVIS FINDINGS Hepatobiliary: No focal abnormality within the liver parenchyma. There is no evidence for gallstones, gallbladder wall thickening, or pericholecystic fluid. Mild extrahepatic common duct distention up to about 10 mm diameter is stable. Common bile duct in the head of the pancreas is nondilated. Pancreas: No focal mass lesion. No dilatation of the main duct. No intraparenchymal cyst. No peripancreatic edema. Spleen: No splenomegaly. No focal mass lesion. Adrenals/Urinary Tract: No adrenal nodule or mass. Kidneys are unremarkable. No evidence for hydroureter. The urinary bladder appears normal for the degree of distention. Stomach/Bowel: Stomach is nondistended. No gastric wall thickening. No evidence of outlet obstruction. Duodenum is normally positioned as is the ligament of Treitz. No small bowel wall thickening. No small bowel dilatation. The terminal ileum is normal. The appendix is normal. No gross colonic mass. No colonic wall thickening. No substantial diverticular change. Vascular/Lymphatic: No abdominal aortic aneurysm. 7 mm short axis hepatoduodenal ligament lymph node has decreased from 16 mm on the prior study. No retroperitoneal lymphadenopathy. No pelvic sidewall lymphadenopathy. Reproductive: Uterus surgically absent.  There is no adnexal mass. Other: No intraperitoneal free fluid. 14 x 15 mm nodule between the left adrenal gland in the pancreas has progressed in the interval from 6 x 8 mm on the prior study. Previously measured 14 x 18 mm nodule between the  stomach in the spleen has progressed, now measuring 20 x 26 mm. 5 mm right perirectal nodule is new in the interval. Small nodules measuring about 8 mm are seen in the posterior mesentery of the left pelvis (image 84 series 3), new in the interval. Musculoskeletal: Bone windows reveal no worrisome lytic or sclerotic osseous lesions. IMPRESSION: 1. Left upper quadrant nodule continues to progress, now measuring 20 x 26 mm. 2. New nodule identified between the in adrenal gland, concerning for metastatic deposit. 3. Interval development of tiny nodules in the left lower quadrant mesenteric, concerning for metastases. 4. No ascites. 5. Status post total abdominal hysterectomy and omentectomy. Electronically Signed   By: Misty Stanley M.D.   On: 07/31/2017 15:15   Ct Abdomen Pelvis W Contrast  Result Date: 07/31/2017 CLINICAL DATA:  Ovarian cancer. EXAM: CT CHEST, ABDOMEN, AND PELVIS WITH CONTRAST TECHNIQUE: Multidetector CT imaging of the chest, abdomen and pelvis was performed following the standard protocol during bolus administration of intravenous contrast. CONTRAST:  133m ISOVUE-300 IOPAMIDOL (ISOVUE-300) INJECTION 61% COMPARISON:  03/07/2017. FINDINGS: CT CHEST FINDINGS Cardiovascular: The heart size is normal. No pericardial effusion. Right Port-A-Cath tip is positioned in the mid to distal SVC. Mediastinum/Nodes: No mediastinal lymphadenopathy. There is no hilar lymphadenopathy. The esophagus has normal imaging features. There is no axillary lymphadenopathy. Lungs/Pleura: 4 mm subpleural right lower lobe pulmonary nodule is unchanged (image 113 series 4). Tiny subpleural nodule left apex measures about 1 mm and is stable. Musculoskeletal: Bone windows reveal no worrisome lytic or sclerotic osseous lesions. CT ABDOMEN PELVIS FINDINGS Hepatobiliary: No focal abnormality within the liver parenchyma. There is no evidence for gallstones, gallbladder wall thickening, or pericholecystic fluid. Mild extrahepatic  common duct distention up to about 10 mm diameter is stable. Common bile duct in the head of the pancreas is nondilated. Pancreas: No focal mass lesion. No dilatation of the main duct. No intraparenchymal cyst. No peripancreatic edema. Spleen:  No splenomegaly. No focal mass lesion. Adrenals/Urinary Tract: No adrenal nodule or mass. Kidneys are unremarkable. No evidence for hydroureter. The urinary bladder appears normal for the degree of distention. Stomach/Bowel: Stomach is nondistended. No gastric wall thickening. No evidence of outlet obstruction. Duodenum is normally positioned as is the ligament of Treitz. No small bowel wall thickening. No small bowel dilatation. The terminal ileum is normal. The appendix is normal. No gross colonic mass. No colonic wall thickening. No substantial diverticular change. Vascular/Lymphatic: No abdominal aortic aneurysm. 7 mm short axis hepatoduodenal ligament lymph node has decreased from 16 mm on the prior study. No retroperitoneal lymphadenopathy. No pelvic sidewall lymphadenopathy. Reproductive: Uterus surgically absent.  There is no adnexal mass. Other: No intraperitoneal free fluid. 14 x 15 mm nodule between the left adrenal gland in the pancreas has progressed in the interval from 6 x 8 mm on the prior study. Previously measured 14 x 18 mm nodule between the stomach in the spleen has progressed, now measuring 20 x 26 mm. 5 mm right perirectal nodule is new in the interval. Small nodules measuring about 8 mm are seen in the posterior mesentery of the left pelvis (image 84 series 3), new in the interval. Musculoskeletal: Bone windows reveal no worrisome lytic or sclerotic osseous lesions. IMPRESSION: 1. Left upper quadrant nodule continues to progress, now measuring 20 x 26 mm. 2. New nodule identified between the in adrenal gland, concerning for metastatic deposit. 3. Interval development of tiny nodules in the left lower quadrant mesenteric, concerning for metastases. 4. No  ascites. 5. Status post total abdominal hysterectomy and omentectomy. Electronically Signed   By: Misty Stanley M.D.   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 CT C/A/P11/12/18 demonstrated progressive disease with increase in size of previous LUQ peritoneal nodule, new left adrenal nodule and new perirectal nodule.   -Saw Dr. Denman George on 08/14/17 and she recommended salvage chemotherapy with platinum and taxane for at least 6 cycles followed by PARP inhibitor maintenance with Niraparib. -Will set patient up to start carbo/taxol ASAP. Discussed side effects of carbo/taxol in detail with the patient today. She will receive formal chemo education again from our nurse. -Plan to restage after 3 cycles of carbo/taxol. -RTC for follow up with cycle 2 of carbo/taxol.    Twana First, MD 08/09/17

## 2017-08-16 NOTE — Patient Instructions (Signed)
[place]   CHEMOTHERAPY INSTRUCTIONS  You have had some progression on your scans.  We are going to treat you with carboplatin and taxol every 3 weeks.  1 cycle is 3 weeks.  You will have 6 cycles.  This treatment is with palliative intent, which means you are treatable but not curable.   You will see the doctor regularly throughout treatment.  We monitor your lab work prior to every treatment.  The doctor monitors your response to treatment by the way you are feeling, your blood work, and scans periodically.  There will be wait times while you are here for treatment.  It will take about 30 minutes to 1 hour for your lab work to result.  Then the pharmacy has to mix your medications and bring them to the clinic.   You will have the following premedications prior to receiving chemotherapy: Premeds: Benadryl:  Help prevent a reaction to the chemotherapy.  Pepcid: antihistamine premed Aloxi - high powered nausea/vomiting prevention medication used for chemotherapy patients.  Dexamethasone - steroid - given to reduce the risk of you having an allergic type reaction to the chemotherapy. Dex can cause you to feel energized, nervous/anxious/jittery, make you have trouble sleeping, and/or make you feel hot/flushed in the face/neck and/or look pink/red in the face/neck. These side effects will pass as the Dex wears off. (takes 20 minutes to infuse)   POTENTIAL SIDE EFFECTS OF TREATMENT:  Carboplatin (Generic Name) Other Names: Paraplatin, CBDCA  About This Drug Carboplatin is a drug used to treat cancer. This drug is given in the vein (IV). This drug takes about 30 minutes to infuse.   Possible Side Effects (More Common) . Nausea and throwing up (vomiting). These symptoms may happen within a few hours after your treatment and may last up to 24 hours. Medicines are available to stop or lessen these side effects. . Bone marrow depression. This is a decrease in the  number of white blood cells, red blood cells, and platelets. This may raise your risk of infection, make you tired and weak (fatigue), and raise your risk of bleeding. . Soreness of the mouth and throat. You may have red areas, white patches, or sores that hurt. . This drug may affect how your kidneys work. Your kidney function will be checked as needed. . Electrolyte changes. Your blood will be checked for electrolyte changes as needed.  Side Effects (Less Common) . Hair loss. Some patients lose their hair on the scalp and body. You may notice your hair thinning seven to 14 days after getting this drug. . Effects on the nerves are called peripheral neuropathy. You may feel numbness, tingling, or pain in your hands and feet. It may be hard for you to button your clothes, open jars, or walk as usual. The effect on the nerves may get worse with more doses of the drug. These effects get better in some people after the drug is stopped but it does not get better in all people. . Loose bowel movements (diarrhea) that may last for several days . Decreased hearing or ringing in the ears . Changes in the way food and drinks taste . Changes in liver function. Your liver function will be checked as needed.  Allergic Reactions Serious allergic reactions including anaphylaxis are rare. While you are getting this drug in your vein (IV), tell your nurse right away if you have any of these symptoms of an allergic reaction: . Trouble catching your  breath . Feeling like your tongue or throat are swelling . Feeling your heart beat quickly or in a not normal way (palpitations) . Feeling dizzy or lightheaded . Flushing, itching, rash, and/or hives  Treating Side Effects . Drink 6-8 cups of fluids each day unless your doctor has told you to limit your fluid intake due to some other health problem. A cup is 8 ounces of fluid. If you throw up or have loose bowel movements, you should drink more fluids so that you do  not become dehydrated (lack water in the body from losing too much fluid). . Mouth care is very important. Your mouth care should consist of routine, gentle cleaning of your teeth or dentures and rinsing your mouth with a mixture of 1/2 teaspoon of salt in 8 ounces of water or  teaspoon of baking soda in 8 ounces of water. This should be done at least after each meal and at bedtime. . If you have mouth sores, avoid mouthwash that has alcohol. Avoid alcohol and smoking because they can bother your mouth and throat. . If you have numbness and tingling in your hands and feet, be careful when cooking, walking, and handling sharp objects and hot liquids. . Talk with your nurse about getting a wig before you lose your hair. Also, call the [facility] at 800-ACS-2345 to find out information about the "Look Good, Feel Better" program close to where you live. It is a free program where women getting chemotherapy can learn about wigs, turbans and scarves as well as makeup techniques and skin and nail care.  Food and Drug Interactions There are no known interactions of carboplatin with food. This drug may interact with other medicines. Tell your doctor and pharmacist about all the medicines and dietary supplements (vitamins, minerals, herbs and others) that you are taking at this time. The safety and use of dietary supplements and alternative diets are often not known. Using these might affect your cancer or interfere with your treatment. Until more is known, you should not use dietary supplements or alternative diets without your doctor's help.  When to Call the Doctor Call your doctor or nurse right away if you have any of these symptoms: . Fever of 100.5 F (38 C) or above; chills . Bleeding or bruising that is not normal . Wheezing or trouble breathing . Nausea that stops you from eating or drinking . Throwing up more than once a day . Rash or itching . Loose bowel movements (diarrhea) more  than four times a day or diarrhea with weakness or feeling lightheaded . Call your doctor or nurse as soon as possible if any of these symptoms happen: . Numbness, tingling, decreased feeling or weakness in fingers, toes, arms, or legs . Change in hearing, ringing in the ears . Blurred vision or other changes in eyesight . Decreased urine . Yellowing of skin or eyes  Problems and Reproductive Concerns Sexual problems and reproduction concerns may happen. In both men and women, this drug may affect your ability to have children. This cannot be determined before your treatment. Talk with your doctor or nurse if you plan to have children. Ask for information on sperm or egg banking. In men, this drug may interfere with your ability to make sperm, but it should not change your ability to have sexual relations. In women, menstrual bleeding may become irregular or stop while you are getting this drug. Do not assume that you cannot become pregnant if you do not  have a menstrual period. Women may go through signs of menopause (change of life) like vaginal dryness or itching. Vaginal lubricants can be used to lessen vaginal dryness, itching, and pain during sexual relations. Genetic counseling is available for you to talk about the effects of this drug therapy on future pregnancies. Also, a genetic counselor can look at the possible risk of problems in the unborn baby due to this medicine if an exposure happens during pregnancy. . Pregnancy warning: This drug may have harmful effects on the unborn child, so effective methods of birth control should be used during your cancer treatment. . Breast feeding warning: It is not known if this drug passes into breast milk. For this reason, women should talk to their doctor about the risks and benefits of breast feeding during treatment with this drug because this drug may enter the breast milk and badly harm a breast feeding baby.   Paclitaxel (Taxol)  Paclitaxel  is a drug used to treat cancer. It is given in the vein (IV).  This will take 3 hours to infuse.   Possible Side Effects . Hair loss. Hair loss is often temporary, although with certain medicine, hair loss can sometimes be permanent. Hair loss may happen suddenly or gradually. If you lose hair, you may lose it from your head, face, armpits, pubic area, chest, and/or legs. You may also notice your hair getting thin. . Swelling of your legs, ankles and/or feet (edema) . Flushing . Nausea and throwing up (vomiting) . Loose bowel movements (diarrhea) . Bone marrow depression. This is a decrease in the number of white blood cells, red blood cells, and platelets. This may raise your risk of infection, make you tired and weak (fatigue), and raise your risk of bleeding. . Effects on the nerves are called peripheral neuropathy. You may feel numbness, tingling, or pain in your hands and feet. It may be hard for you to button your clothes, open jars, or walk as usual. The effect on the nerves may get worse with more doses of the drug. These effects get better in some people after the drug is stopped but it does not get better in all people. . Changes in your liver function . Bone, joint and muscle pain . Abnormal EKG . Allergic reaction: Allergic reactions, including anaphylaxis are rare but may happen in some patients. Signs of allergic reaction to this drug may be swelling of the face, feeling like your tongue or throat are swelling, trouble breathing, rash, itching, fever, chills, feeling dizzy, and/or feeling that your heart is beating in a fast or not normal way. If this happens, do not take another dose of this drug. You should get urgent medical treatment. . Infection . Changes in your kidney function. Note: Each of the side effects above was reported in 20% or greater of patients treated with paclitaxel. Not all possible side effects are included above. Warnings and Precautions . Severe bone marrow  depression   Side Effects . To help with hair loss, wash with a mild shampoo and avoid washing your hair every day. . Avoid rubbing your scalp, instead, pat your hair or scalp dry . Avoid coloring your hair . Limit your use of hair spray, electric curlers, blow dryers, and curling irons. . If you are interested in getting a wig, talk to your nurse. You can also call the [facility] at 800-ACS-2345 to find out information about the "Look Good, Feel Better" program close to where you live. It  is a free program where women getting chemotherapy can learn about wigs, turbans and scarves as well as makeup techniques and skin and nail care. . Ask your doctor or nurse about medicines that are available to help stop or lessen diarrhea and/or nausea. . To help with nausea and vomiting, eat small, frequent meals instead of three large meals a day. Choose foods and drinks that are at room temperature. Ask your nurse or doctor about other helpful tips and medicine that is available to help or stop lessen these symptoms. . If you get diarrhea, eat low-fiber foods that are high in protein and calories and avoid foods that can irritate your digestive tracts or lead to cramping. Ask your nurse or doctor about medicine that can lessen or stop your diarrhea. . Mouth care is very important. Your mouth care should consist of routine, gentle cleaning of your teeth or dentures and rinsing your mouth with a mixture of 1/2 teaspoon of salt in 8 ounces of water or  teaspoon of baking soda in 8 ounces of water. This should be done at least after each meal and at bedtime. . If you have mouth sores, avoid mouthwash that has alcohol. Also avoid alcohol and smoking because they can bother your mouth and throat. . Drink plenty of fluids (a minimum of eight glasses per day is recommended). . Take your temperature as your doctor or nurse tells you, and whenever you feel like you may have a fever. . Talk to your doctor or  nurse about precautions you can take to avoid infections and bleeding. . Be careful when cooking, walking, and handling sharp objects and hot liquids.  Food and Drug Interactions . There are no known interactions of paclitaxel with food. . This drug may interact with other medicines. Tell your doctor and pharmacist about all the medicines and dietary supplements (vitamins, minerals, herbs and others) that you are taking at this time. . The safety and use of dietary supplements and alternative diets are often not known. Using these might affect your cancer or interfere with your treatment. Until more is known, you should not use dietary supplements or alternative diets without your cancer doctor's help.  When to Call the Doctor Call your doctor or nurse if you have any of the following symptoms and/or any new or unusual symptoms: . Fever of 100.5 F (38 C) or above . Chills . Redness, pain, warmth, or swelling at the IV site during the infusion . Signs of allergic reaction: swelling of the face, feeling like your tongue or throat are swelling, trouble breathing, rash, itching, fever, chills, feeling dizzy, and/or feeling that your heart is beating in a fast or not normal way . Feeling that your heart is beating in a fast or not normal way (palpitations) . Weight gain of 5 pounds in one week (fluid retention) . Decreased urine or very dark urine . Signs of liver problems: dark urine, pale bowel movements, bad stomach pain, feeling very tired and weak, unusual itching, or yellowing of the eyes or skin . Heavy menstrual period that lasts longer than normal . Easy bruising or bleeding . Nausea that stops you from eating or drinking, and/or that is not relieved by prescribed medicines. . Loose bowel movements (diarrhea) more than 4 times a day or diarrhea with weakness or lightheadedness . Pain in your mouth or throat that makes it hard to eat or drink . Lasting loss of appetite or rapid weight loss  of five pounds in  a week . Signs of peripheral neuropathy: numbness, tingling, or decreased feeling in fingers or toes; trouble walking or changes in the way you walk; or feeling clumsy when buttoning clothes, opening jars, or other routine activities . Joint and muscle pain that is not relieved by prescribed medicines . Extreme fatigue that interferes with normal activities . While you are getting this drug, please tell your nurse right away if you have any pain, redness, or swelling at the site of the IV infusion. . If you think you are pregnant.  Reproduction Warnings . Pregnancy warning: This drug may have harmful effects on the unborn child, it is recommended that effective methods of birth control should be used during your cancer treatment. Let your doctor know right away if you think you may be pregnant. . Breast feeding warning: Women should not breast feed during treatment because this drug could enter the breast milk and cause harm to a breast feeding baby.  SELF CARE ACTIVITIES WHILE ON CHEMOTHERAPY: Hydration Increase your fluid intake 48 hours prior to treatment and drink at least 8 to 12 cups (64 ounces) of water/decaff beverages per day after treatment. You can still have your cup of coffee or soda but these beverages do not count as part of your 8 to 12 cups that you need to drink daily. No alcohol intake.  Medications Continue taking your normal prescription medication as prescribed.  If you start any new herbal or new supplements please let us know first to make sure it is safe.  Mouth Care Have teeth cleaned professionally before starting treatment. Keep dentures and partial plates clean. Use soft toothbrush and do not use mouthwashes that contain alcohol. Biotene is a good mouthwash that is available at most pharmacies or may be ordered by calling [phone]. Use warm salt water gargles (1 teaspoon salt per 1 quart warm water) before and after meals and at bedtime. Or you  may rinse with 2 tablespoons of three-percent hydrogen peroxide mixed in eight ounces of water. If you are still having problems with your mouth or sores in your mouth please call the clinic. If you need dental work, please let the doctor know before you go for your appointment so that we can coordinate the best possible time for you in regards to your chemo regimen. You need to also let your dentist know that you are actively taking chemo. We may need to do labs prior to your dental appointment.   Skin Care Always use sunscreen that has not expired and with SPF (Sun Protection Factor) of 50 or higher. Wear hats to protect your head from the sun. Remember to use sunscreen on your hands, ears, face, & feet.  Use good moisturizing lotions such as udder cream, eucerin, or even Vaseline. Some chemotherapies can cause dry skin, color changes in your skin and nails.    . Avoid long, hot showers or baths. . Use gentle, fragrance-free soaps and laundry detergent. . Use moisturizers, preferably creams or ointments rather than lotions because the thicker consistency is better at preventing skin dehydration. Apply the cream or ointment within 15 minutes of showering. Reapply moisturizer at night, and moisturize your hands every time after you wash them.  Hair Loss (if your doctor says your hair will fall out)  . If your doctor says that your hair is likely to fall out, decide before you begin chemo whether you want to wear a wig. You may want to shop before treatment to match your hair  color. . Hats, turbans, and scarves can also camouflage hair loss, although some people prefer to leave their heads uncovered. If you go bare-headed outdoors, be sure to use sunscreen on your scalp. . Cut your hair short. It eases the inconvenience of shedding lots of hair, but it also can reduce the emotional impact of watching your hair fall out. . Don't perm or color your hair during chemotherapy. Those chemical treatments are  already damaging to hair and can enhance hair loss. Once your chemo treatments are done and your hair has grown back, it's OK to resume dyeing or perming hair. With chemotherapy, hair loss is almost always temporary. But when it grows back, it may be a different color or texture. In older adults who still had hair color before chemotherapy, the new growth may be completely gray.  Often, new hair is very fine and soft.  Infection Prevention Please wash your hands for at least 30 seconds using warm soapy water. Handwashing is the #1 way to prevent the spread of germs. Stay away from sick people or people who are getting over a cold. If you develop respiratory systems such as green/yellow mucus production or productive cough or persistent cough let us know and we will see if you need an antibiotic. It is a good idea to keep a pair of gloves on when going into grocery stores/[employer] to decrease your risk of coming into contact with germs on the carts, etc. Carry alcohol hand gel with you at all times and use it frequently if out in public. If your temperature reaches 100.5 or higher please call the clinic and let us know.  If it is after hours or on the weekend please go to the ER if your temperature is over 100.5.  Please have your own personal thermometer at home to use.    Sex and bodily fluids If you are going to have sex, a condom must be used to protect the person that isn't taking chemotherapy. Chemo can decrease your libido (sex drive). For a few days after chemotherapy, chemotherapy can be excreted through your bodily fluids.  When using the toilet please close the lid and flush the toilet twice.  Do this for a few day after you have had chemotherapy.    Effects of chemotherapy on your sex life Some changes are simple and won't last long. They won't affect your sex life permanently. Sometimes you may feel: . too tired . not strong enough to be very active . sick or sore  . not in the  mood . anxious or low Your anxiety might not seem related to sex. For example, you may be worried about the cancer and how your treatment is going. Or you may be worried about money, or about how you family are coping with your illness. These things can cause stress, which can affect your interest in sex. It's important to talk to your partner about how you feel. Remember - the changes to your sex life don't usually last long. There's usually no medical reason to stop having sex during chemo. The drugs won't have any long term physical effects on your performance or enjoyment of sex. Cancer can't be passed on to your partner during sex  Contraception It's important to use reliable contraception during treatment. Avoid getting pregnant while you or your partner are having chemotherapy. This is because the drugs may harm the baby. Sometimes chemotherapy drugs can leave a man or woman infertile.  This means you would  not be able to have children in the future. You might want to talk to someone about permanent infertility. It can be very difficult to learn that you may no longer be able to have children. Some people find counselling helpful. There might be ways to preserve your fertility, although this is easier for men than for women. You may want to speak to a fertility expert. You can talk about sperm banking or harvesting your eggs. You can also ask about other fertility options, such as donor eggs. If you have or have had breast cancer, your doctor might advise you not to take the contraceptive pill. This is because the hormones in it might affect the cancer.  It is not known for sure whether or not chemotherapy drugs can be passed on through semen or secretions from the vagina. Because of this some doctors advise people to use a barrier method if you have sex during treatment. This applies to vaginal, anal or oral sex. Generally, doctors advise a barrier method only for the time you are actually having  the treatment and for about a week after your treatment. Advice like this can be worrying, but this does not mean that you have to avoid being intimate with your partner. You can still have close contact with your partner and continue to enjoy sex.  Animals If you have cats or birds we just ask that you not change the litter or change the cage.  Please have someone else do this for you while you are on chemotherapy.   Food Safety During and After Cancer Treatment Food safety is important for people both during and after cancer treatment. Cancer and cancer treatments, such as chemotherapy, radiation therapy, and stem cell/bone marrow transplantation, often weaken the immune system. This makes it harder for your body to protect itself from foodborne illness, also called food poisoning. Foodborne illness is caused by eating food that contains harmful bacteria, parasites, or viruses.    Foods to avoid Some foods have a higher risk of becoming tainted with bacteria. These include: Marland Kitchen Unwashed fresh fruit and vegetables, especially leafy vegetables that can hide dirt and other contaminants . Raw sprouts, such as alfalfa sprouts . Raw or undercooked beef, especially ground beef, or other raw or undercooked meat and poultry . Fatty, fried, or spicy foods immediately before or after treatment.  These can sit heavy on your stomach and make you feel nauseous. . Raw or undercooked shellfish, such as oysters. . Sushi and sashimi, which often contain raw fish.  . Unpasteurized beverages, such as unpasteurized fruit juices, raw milk, raw yogurt, or cider . Undercooked eggs, such as soft boiled, over easy, and poached; raw, unpasteurized eggs; or foods made with raw egg, such as homemade raw cookie dough and homemade mayonnaise Simple steps for food safety Shop smart. . Do not buy food stored or displayed in an unclean area. . Do not buy bruised or damaged fruits or vegetables. . Do not buy cans that have  cracks, dents, or bulges. . Pick up foods that can spoil at the end of your shopping trip and store them in a cooler on the way home. Prepare and clean up foods carefully. . Rinse all fresh fruits and vegetables under running water, and dry them with a clean towel or paper towel. . Clean the top of cans before opening them. . After preparing food, wash your hands for 20 seconds with hot water and soap. Pay special attention to areas between fingers and  under nails. . Clean your utensils and dishes with hot water and soap. Marland Kitchen Disinfect your kitchen and cutting boards using 1 teaspoon of liquid, unscented bleach mixed into 1 quart of water.   Dispose of old food. . Eat canned and packaged food before its expiration date (the "use by" or "best before" date). . Consume refrigerated leftovers within 3 to 4 days. After that time, throw out the food. Even if the food does not smell or look spoiled, it still may be unsafe. Some bacteria, such as Listeria, can grow even on foods stored in the refrigerator if they are kept for too long. Take precautions when eating out. . At restaurants, avoid buffets and salad bars where food sits out for a long time and comes in contact with many people. Food can become contaminated when someone with a virus, often a norovirus, or another "bug" handles it. . Put any leftover food in a "to-go" container yourself, rather than having the server do it. And, refrigerate leftovers as soon as you get home. . Choose restaurants that are clean and that are willing to prepare your food as you order it cooked.              MEDICATIONS: Dexamethasone 52m tablet:  Take 2 tablets (8 mg total) by mouth daily. Start the day after chemotherapy for 2 days.  Take with food.                                                                                                                     Zofran/Ondansetron 846mtablet. Take 1 tablet every 8 hours as needed for nausea/vomiting. (#1  nausea med to take, this can constipate)  Compazine/Prochlorperazine 1073mablet. Take 1 tablet every 6 hours as needed for nausea/vomiting. (#2 nausea med to take, this can make you sleepy)   EMLA cream. Apply a quarter size amount to port site 1 hour prior to chemo. Do not rub in. Cover with plastic wrap.   Over-the-Counter Meds:  Miralax 1 capful in 8 oz of fluid daily. May increase to two times a day if needed. This is a stool softener. If this doesn't work proceed you can add:  Senokot S-start with 1 tablet two times a day and increase to 4 tablets two times a day if needed. (total of 8 tablets in a 24 hour period). This is a stimulant laxative.   Call us [place] this does not help your bowels move.   Imodium 2mg6mpsule. Take 2 capsules after the 1st loose stool and then 1 capsule every 2 hours until you go a total of 12 hours without having a loose stool. Call the [place]ose stools continue. If diarrhea occurs @ bedtime, take 2 capsules @ bedtime. Then take 2 capsules every 4 hours until morning. Call [place]arrhea Sheet  If you are having loose stools/diarrhea, please purchase Imodium and begin taking as outlined:  At the first sign of poorly formed or loose stools  you should begin taking Imodium(loperamide) 2 mg capsules.  Take two caplets (70m) followed by one caplet (271m every 2 hours until you have had no diarrhea for 12 hours.  During the night take two caplets (28m59mat bedtime and continue every 4 hours during the night until the morning.  Stop taking Imodium only after there is no sign of diarrhea for 12 hours.    Always call the [place] you are having loose stools/diarrhea that you can't get under control.  Loose stools/disrrhea leads to dehydration (loss of water) in your body.  We have other options of trying to get the loose stools/diarrhea to stopped but you must let us [place]!    Constipation Sheet *Miralax in 8 oz of fluid daily.  May  increase to two times a day if needed.  This is a stool softener.  If this not enough to keep your bowel regular:  You can add:  *Senokot S, start with one tablet twice a day and can increase to 4 tablets twice a day if needed.  This is a stimulant laxative.   Sometimes when you take pain medication you need BOTH a medicine to keep your stool soft and a medicine to help your bowel push it out!  Please call if the above does not work for you.   Do not go more than 2 days without a bowel movement.  It is very important that you do not become constipated.  It will make you feel sick to your stomach (nausea) and can cause abdominal pain and vomiting.    Nausea Sheet  Zofran/Ondansetron 8mg72mblet. Take 1 tablet every 8 hours as needed for nausea/vomiting. (#1 nausea med to take, this can constipate)  Compazine/Prochlorperazine 10mg82mlet. Take 1 tablet every 6 hours as needed for nausea/vomiting. (#2 nausea med to take, this can make you sleepy)  You can take these medications together or separately.  We would first like for you to try the Ondansetron by itself and then take the Prochloperizine if needed. But you are allowed to take both medications at the same time if your nausea is that severe.  If you are having persistent nausea (nausea that does not stop) please take these medications on a staggered schedule so that the nausea medication stays in your body.  Please call the [place] the amount of nausea that you are experiencing.  If you begin to vomit, you need to call the [place] it is the weekend and you have vomited more than one time and cant get it to stop-go to the Emergency Room.  Persistent nausea/vomiting can lead to dehydration (loss of fluid in your body) and will make you feel terrible.   Ice chips, sips of clear liquids, foods that are @ room temperature, crackers, and toast tend to be better tolerated.     SYMPTOMS TO REPORT AS SOON AS  POSSIBLE AFTER TREATMENT:  FEVER GREATER THAN 100.5 F  CHILLS WITH OR WITHOUT FEVER  NAUSEA AND VOMITING THAT IS NOT CONTROLLED WITH YOUR NAUSEA MEDICATION  UNUSUAL SHORTNESS OF BREATH  UNUSUAL BRUISING OR BLEEDING  TENDERNESS IN MOUTH AND THROAT WITH OR WITHOUT PRESENCE OF ULCERS  URINARY PROBLEMS  BOWEL PROBLEMS  UNUSUAL RASH    Wear comfortable clothing and clothing appropriate for easy access to any Portacath or PICC line. Let us kn[place] if there is anything that we can do to make your therapy better!    What to  do if you need assistance after hours or on the weekends: CALL [phone].  HOLD on the line, do not hang up.  You will hear multiple messages but at the end you will be connected with a nurse triage line.  They will contact the doctor if necessary.  Most of the time they will be able to assist you.  Do not call the hospital operator.    I have been informed and understand all of the instructions given to me and have received a copy. I have been instructed to call the clinic [phone] or my family physician as soon as possible for continued medical care, if indicated. I do not have any more questions at this time but understand that I may call the [facility] or the Patient Navigator at [phone] during office hours should I have questions or need assistance in obtaining follow-up care.

## 2017-08-16 NOTE — Patient Instructions (Signed)
[place] at [facility] Discharge Instructions  RECOMMENDATIONS MADE BY THE CONSULTANT AND ANY TEST RESULTS WILL BE SENT TO YOUR REFERRING PHYSICIAN.  We will get you set up to start chemotherapy as soon as possible. Follow up with your second treatment  Thank you for choosing [place] at [facility] to provide your oncology and hematology care.  To afford each patient quality time with our provider, please arrive at least 15 minutes before your scheduled appointment time.    If you have a lab appointment with the [facility] please come in thru the  Main Entrance and check in at the main information desk  You need to re-schedule your appointment should you arrive 10 or more minutes late.  We strive to give you quality time with our providers, and arriving late affects you and other patients whose appointments are after yours.  Also, if you no show three or more times for appointments you may be dismissed from the clinic at the providers discretion.     Again, thank you for choosing [facility].  Our hope is that these requests will decrease the amount of time that you wait before being seen by our physicians.       _____________________________________________________________  Should you have questions after your visit to [facility], please contact our office at [phone] between the hours of 8:30 a.m. and 4:30 p.m.  Voicemails left after 4:30 p.m. will not be returned until the following business day.  For prescription refill requests, have your pharmacy contact our office.       Resources For Cancer Patients and their Caregivers ? American Cancer Society: Can assist with transportation, wigs, general needs, runs Look Good Feel Better.        313-503-1444 ? Cancer Care: Provides financial assistance, online support groups, medication/co-pay assistance.  1-800-813-HOPE (306)165-3052) ? [place] Assists [place] cancer patients and their families through emotional , educational and financial support.  (236)620-4069 ? [place] DSS Where to apply for food stamps, Medicaid and utility assistance. 970 196 4433 ? RCATS: Transportation to medical appointments. 406-722-5771 ? Social Security Administration: May apply for disability if have a Stage IV cancer. 765-548-8397 365-233-7007 ? [employer], Disability and Transit Services: Assists with nutrition, care and transit needs. [place] Support Programs: @10RELATIVEDAYS @ > Cancer Support Group  2nd Tuesday of the month 1pm-2pm, Journey Room  > Creative Journey  3rd Tuesday of the month 1130am-1pm, Journey Room  > Look Good Feel Better  1st Wednesday of the month 10am-12 noon, Journey Room (Call [place] to register [phone])

## 2017-08-16 NOTE — Progress Notes (Signed)
Chemotherapy teaching pulled together.

## 2017-08-17 ENCOUNTER — Encounter (HOSPITAL_COMMUNITY): Payer: Medicare Other

## 2017-08-17 ENCOUNTER — Encounter (HOSPITAL_COMMUNITY): Payer: Self-pay

## 2017-08-17 ENCOUNTER — Encounter (HOSPITAL_BASED_OUTPATIENT_CLINIC_OR_DEPARTMENT_OTHER): Payer: Medicare Other

## 2017-08-17 VITALS — BP 139/72 | HR 85 | Temp 98.5°F | Resp 18 | Wt 160.0 lb

## 2017-08-17 DIAGNOSIS — Z5189 Encounter for other specified aftercare: Secondary | ICD-10-CM

## 2017-08-17 DIAGNOSIS — C786 Secondary malignant neoplasm of retroperitoneum and peritoneum: Secondary | ICD-10-CM | POA: Diagnosis not present

## 2017-08-17 DIAGNOSIS — C569 Malignant neoplasm of unspecified ovary: Secondary | ICD-10-CM

## 2017-08-17 DIAGNOSIS — Z5111 Encounter for antineoplastic chemotherapy: Secondary | ICD-10-CM

## 2017-08-17 DIAGNOSIS — Z08 Encounter for follow-up examination after completed treatment for malignant neoplasm: Secondary | ICD-10-CM | POA: Diagnosis not present

## 2017-08-17 LAB — CBC WITH DIFFERENTIAL/PLATELET
BASOS PCT: 1 %
Basophils Absolute: 0 10*3/uL (ref 0.0–0.1)
EOS ABS: 0.4 10*3/uL (ref 0.0–0.7)
EOS PCT: 7 %
HCT: 35 % — ABNORMAL LOW (ref 36.0–46.0)
Hemoglobin: 11.2 g/dL — ABNORMAL LOW (ref 12.0–15.0)
LYMPHS ABS: 1.3 10*3/uL (ref 0.7–4.0)
Lymphocytes Relative: 26 %
MCH: 34.3 pg — AB (ref 26.0–34.0)
MCHC: 32 g/dL (ref 30.0–36.0)
MCV: 107 fL — ABNORMAL HIGH (ref 78.0–100.0)
MONOS PCT: 14 %
Monocytes Absolute: 0.7 10*3/uL (ref 0.1–1.0)
Neutro Abs: 2.6 10*3/uL (ref 1.7–7.7)
Neutrophils Relative %: 52 %
PLATELETS: 283 10*3/uL (ref 150–400)
RBC: 3.27 MIL/uL — ABNORMAL LOW (ref 3.87–5.11)
RDW: 13.3 % (ref 11.5–15.5)
WBC: 4.9 10*3/uL (ref 4.0–10.5)

## 2017-08-17 LAB — COMPREHENSIVE METABOLIC PANEL
ALT: 25 U/L (ref 14–54)
AST: 29 U/L (ref 15–41)
Albumin: 4.1 g/dL (ref 3.5–5.0)
Alkaline Phosphatase: 78 U/L (ref 38–126)
Anion gap: 6 (ref 5–15)
BILIRUBIN TOTAL: 0.4 mg/dL (ref 0.3–1.2)
BUN: 13 mg/dL (ref 6–20)
CALCIUM: 9.5 mg/dL (ref 8.9–10.3)
CHLORIDE: 105 mmol/L (ref 101–111)
CO2: 27 mmol/L (ref 22–32)
CREATININE: 1 mg/dL (ref 0.44–1.00)
GFR, EST NON AFRICAN AMERICAN: 54 mL/min — AB (ref >60)
Glucose, Bld: 89 mg/dL (ref 65–99)
Potassium: 4.5 mmol/L (ref 3.5–5.1)
Sodium: 138 mmol/L (ref 135–145)
Total Protein: 7.1 g/dL (ref 6.5–8.1)

## 2017-08-17 MED ORDER — FAMOTIDINE IN NACL 20-0.9 MG/50ML-% IV SOLN
20.0000 mg | Freq: Once | INTRAVENOUS | Status: AC
  Administered 2017-08-17: 20 mg via INTRAVENOUS

## 2017-08-17 MED ORDER — DIPHENHYDRAMINE HCL 50 MG/ML IJ SOLN
50.0000 mg | Freq: Once | INTRAMUSCULAR | Status: AC
  Administered 2017-08-17: 50 mg via INTRAVENOUS

## 2017-08-17 MED ORDER — SODIUM CHLORIDE 0.9% FLUSH
10.0000 mL | INTRAVENOUS | Status: DC | PRN
  Administered 2017-08-17: 10 mL
  Filled 2017-08-17: qty 10

## 2017-08-17 MED ORDER — SODIUM CHLORIDE 0.9 % IV SOLN
404.5000 mg | Freq: Once | INTRAVENOUS | Status: AC
  Administered 2017-08-17: 400 mg via INTRAVENOUS
  Filled 2017-08-17: qty 40

## 2017-08-17 MED ORDER — PACLITAXEL CHEMO INJECTION 300 MG/50ML
175.0000 mg/m2 | Freq: Once | INTRAVENOUS | Status: AC
  Administered 2017-08-17: 318 mg via INTRAVENOUS
  Filled 2017-08-17: qty 53

## 2017-08-17 MED ORDER — PALONOSETRON HCL INJECTION 0.25 MG/5ML
INTRAVENOUS | Status: AC
  Filled 2017-08-17: qty 5

## 2017-08-17 MED ORDER — PALONOSETRON HCL INJECTION 0.25 MG/5ML
0.2500 mg | Freq: Once | INTRAVENOUS | Status: AC
  Administered 2017-08-17: 0.25 mg via INTRAVENOUS

## 2017-08-17 MED ORDER — HEPARIN SOD (PORK) LOCK FLUSH 100 UNIT/ML IV SOLN
500.0000 [IU] | Freq: Once | INTRAVENOUS | Status: AC | PRN
  Administered 2017-08-17: 500 [IU]

## 2017-08-17 MED ORDER — SODIUM CHLORIDE 0.9 % IV SOLN
20.0000 mg | Freq: Once | INTRAVENOUS | Status: AC
  Administered 2017-08-17: 20 mg via INTRAVENOUS
  Filled 2017-08-17: qty 2

## 2017-08-17 MED ORDER — DIPHENHYDRAMINE HCL 50 MG/ML IJ SOLN
INTRAMUSCULAR | Status: AC
  Filled 2017-08-17: qty 1

## 2017-08-17 MED ORDER — PEGFILGRASTIM 6 MG/0.6ML ~~LOC~~ PSKT
6.0000 mg | PREFILLED_SYRINGE | Freq: Once | SUBCUTANEOUS | Status: AC
  Administered 2017-08-17: 6 mg via SUBCUTANEOUS

## 2017-08-17 MED ORDER — SODIUM CHLORIDE 0.9 % IV SOLN
Freq: Once | INTRAVENOUS | Status: AC
  Administered 2017-08-17: 10:00:00 via INTRAVENOUS

## 2017-08-17 MED ORDER — FAMOTIDINE IN NACL 20-0.9 MG/50ML-% IV SOLN
INTRAVENOUS | Status: AC
  Filled 2017-08-17: qty 50

## 2017-08-17 NOTE — Progress Notes (Signed)
To treatment room for labs and chemotherapy.  Consent signed. Chemotherapy teaching completed with understanding verbalized.   Information packet given and reviewed with the patient before start of chemotherapy.  All questions asked and answered.  Patient stated she has her nausea medications.  Neuropathy better and good appetite.    Labs reviewed with Dr. Talbert Cage and ok to treat today.   Reviewed symptoms and when to notify the nurse for any changes in how she feels while receiving chemotherapy with understanding verbalized.    Patient tolerated chemotherapy with no complaints voiced.  Port site clean and dry with no bruising or swelling noted at site.  Band aid applied. Neulasta Onpro applied to left arm with green indicator light flashing.  Reviewed directions for Onpro and written information given for care and removal.  Patient verbalized understanding.  VSS with discharge and left ambulatory with no s/s of distress noted.

## 2017-08-17 NOTE — Patient Instructions (Signed)
[place] Discharge Instructions for Patients Receiving Chemotherapy  Today you received the following chemotherapy agents Taxol and Carboplatin.     If you develop nausea and vomiting that is not controlled by your nausea medication, call the clinic.   BELOW ARE SYMPTOMS THAT SHOULD BE REPORTED IMMEDIATELY:  *FEVER GREATER THAN 100.5 F  *CHILLS WITH OR WITHOUT FEVER  NAUSEA AND VOMITING THAT IS NOT CONTROLLED WITH YOUR NAUSEA MEDICATION  *UNUSUAL SHORTNESS OF BREATH  *UNUSUAL BRUISING OR BLEEDING  TENDERNESS IN MOUTH AND THROAT WITH OR WITHOUT PRESENCE OF ULCERS  *URINARY PROBLEMS  *BOWEL PROBLEMS  UNUSUAL RASH Items with * indicate a potential emergency and should be followed up as soon as possible.  Feel free to call the clinic should you have any questions or concerns. The clinic phone number is [phone].  Please show the [place] at check-in to the Emergency Department and triage nurse.

## 2017-08-18 ENCOUNTER — Telehealth (HOSPITAL_COMMUNITY): Payer: Self-pay

## 2017-08-18 LAB — CA 125: CANCER ANTIGEN (CA) 125: 167.5 U/mL — AB (ref 0.0–38.1)

## 2017-08-18 NOTE — Telephone Encounter (Signed)
Phone call for 24 hour post infusion for chemotherapy. No answer and message left on answering machine to call the [facility] to let us know how she is doing.

## 2017-08-21 ENCOUNTER — Telehealth: Payer: Self-pay | Admitting: *Deleted

## 2017-08-21 NOTE — Telephone Encounter (Signed)
Called and left the patient a message to call the office back tomorrow. WE have cancelled the appt for 12/19.

## 2017-08-22 ENCOUNTER — Telehealth: Payer: Self-pay | Admitting: *Deleted

## 2017-08-22 NOTE — Telephone Encounter (Signed)
Called and left the patient a message to call the office back. Patient needs to know that her appt on 12/5 was cancelled

## 2017-08-23 ENCOUNTER — Telehealth: Payer: Self-pay | Admitting: *Deleted

## 2017-08-23 NOTE — Telephone Encounter (Signed)
Called and left the patient a message to call the office back. Need to inform patient that we cancelled her appt on 12/19 with Dr. Denman George

## 2017-08-24 ENCOUNTER — Telehealth: Payer: Self-pay | Admitting: *Deleted

## 2017-08-24 NOTE — Telephone Encounter (Signed)
Called and left the patient a message with the information that her appt on December 19th is cancelled. Patient was seen on November 26th

## 2017-09-06 ENCOUNTER — Ambulatory Visit: Payer: Medicare Other | Admitting: Gynecologic Oncology

## 2017-09-07 ENCOUNTER — Other Ambulatory Visit: Payer: Self-pay

## 2017-09-07 ENCOUNTER — Encounter (HOSPITAL_COMMUNITY): Payer: Self-pay | Admitting: Oncology

## 2017-09-07 ENCOUNTER — Encounter (HOSPITAL_COMMUNITY): Payer: Medicare Other | Attending: Oncology

## 2017-09-07 ENCOUNTER — Ambulatory Visit (HOSPITAL_COMMUNITY): Payer: Medicare Other

## 2017-09-07 ENCOUNTER — Encounter (HOSPITAL_BASED_OUTPATIENT_CLINIC_OR_DEPARTMENT_OTHER): Payer: Medicare Other | Admitting: Oncology

## 2017-09-07 ENCOUNTER — Encounter (HOSPITAL_COMMUNITY): Payer: Medicare Other

## 2017-09-07 VITALS — BP 138/68 | HR 79 | Temp 98.0°F | Resp 18 | Wt 161.6 lb

## 2017-09-07 DIAGNOSIS — Z9079 Acquired absence of other genital organ(s): Secondary | ICD-10-CM | POA: Insufficient documentation

## 2017-09-07 DIAGNOSIS — Z5111 Encounter for antineoplastic chemotherapy: Secondary | ICD-10-CM | POA: Diagnosis present

## 2017-09-07 DIAGNOSIS — F209 Schizophrenia, unspecified: Secondary | ICD-10-CM | POA: Diagnosis not present

## 2017-09-07 DIAGNOSIS — C569 Malignant neoplasm of unspecified ovary: Secondary | ICD-10-CM

## 2017-09-07 DIAGNOSIS — E559 Vitamin D deficiency, unspecified: Secondary | ICD-10-CM | POA: Diagnosis not present

## 2017-09-07 DIAGNOSIS — C7989 Secondary malignant neoplasm of other specified sites: Secondary | ICD-10-CM

## 2017-09-07 DIAGNOSIS — Z7982 Long term (current) use of aspirin: Secondary | ICD-10-CM | POA: Diagnosis not present

## 2017-09-07 DIAGNOSIS — E782 Mixed hyperlipidemia: Secondary | ICD-10-CM | POA: Diagnosis not present

## 2017-09-07 DIAGNOSIS — Z79899 Other long term (current) drug therapy: Secondary | ICD-10-CM | POA: Diagnosis not present

## 2017-09-07 DIAGNOSIS — I10 Essential (primary) hypertension: Secondary | ICD-10-CM | POA: Insufficient documentation

## 2017-09-07 DIAGNOSIS — Z90722 Acquired absence of ovaries, bilateral: Secondary | ICD-10-CM | POA: Insufficient documentation

## 2017-09-07 DIAGNOSIS — C786 Secondary malignant neoplasm of retroperitoneum and peritoneum: Secondary | ICD-10-CM | POA: Diagnosis not present

## 2017-09-07 DIAGNOSIS — C7972 Secondary malignant neoplasm of left adrenal gland: Secondary | ICD-10-CM

## 2017-09-07 DIAGNOSIS — Z90710 Acquired absence of both cervix and uterus: Secondary | ICD-10-CM | POA: Insufficient documentation

## 2017-09-07 DIAGNOSIS — Z5189 Encounter for other specified aftercare: Secondary | ICD-10-CM

## 2017-09-07 DIAGNOSIS — C7800 Secondary malignant neoplasm of unspecified lung: Secondary | ICD-10-CM | POA: Insufficient documentation

## 2017-09-07 DIAGNOSIS — Z9221 Personal history of antineoplastic chemotherapy: Secondary | ICD-10-CM | POA: Diagnosis not present

## 2017-09-07 DIAGNOSIS — E538 Deficiency of other specified B group vitamins: Secondary | ICD-10-CM | POA: Insufficient documentation

## 2017-09-07 DIAGNOSIS — K5090 Crohn's disease, unspecified, without complications: Secondary | ICD-10-CM | POA: Diagnosis not present

## 2017-09-07 LAB — CBC WITH DIFFERENTIAL/PLATELET
Basophils Absolute: 0 10*3/uL (ref 0.0–0.1)
Basophils Relative: 1 %
EOS ABS: 0.1 10*3/uL (ref 0.0–0.7)
EOS PCT: 3 %
HCT: 30.7 % — ABNORMAL LOW (ref 36.0–46.0)
Hemoglobin: 10 g/dL — ABNORMAL LOW (ref 12.0–15.0)
LYMPHS ABS: 1.4 10*3/uL (ref 0.7–4.0)
Lymphocytes Relative: 26 %
MCH: 34.4 pg — AB (ref 26.0–34.0)
MCHC: 32.6 g/dL (ref 30.0–36.0)
MCV: 105.5 fL — ABNORMAL HIGH (ref 78.0–100.0)
MONO ABS: 0.8 10*3/uL (ref 0.1–1.0)
MONOS PCT: 15 %
Neutro Abs: 3 10*3/uL (ref 1.7–7.7)
Neutrophils Relative %: 55 %
PLATELETS: 289 10*3/uL (ref 150–400)
RBC: 2.91 MIL/uL — ABNORMAL LOW (ref 3.87–5.11)
RDW: 14.3 % (ref 11.5–15.5)
WBC: 5.3 10*3/uL (ref 4.0–10.5)

## 2017-09-07 LAB — COMPREHENSIVE METABOLIC PANEL
ALT: 24 U/L (ref 14–54)
ANION GAP: 9 (ref 5–15)
AST: 28 U/L (ref 15–41)
Albumin: 3.9 g/dL (ref 3.5–5.0)
Alkaline Phosphatase: 76 U/L (ref 38–126)
BUN: 14 mg/dL (ref 6–20)
CHLORIDE: 103 mmol/L (ref 101–111)
CO2: 26 mmol/L (ref 22–32)
Calcium: 9.4 mg/dL (ref 8.9–10.3)
Creatinine, Ser: 1.02 mg/dL — ABNORMAL HIGH (ref 0.44–1.00)
GFR, EST NON AFRICAN AMERICAN: 53 mL/min — AB (ref >60)
Glucose, Bld: 102 mg/dL — ABNORMAL HIGH (ref 65–99)
Potassium: 4.2 mmol/L (ref 3.5–5.1)
SODIUM: 138 mmol/L (ref 135–145)
Total Bilirubin: 0.5 mg/dL (ref 0.3–1.2)
Total Protein: 7 g/dL (ref 6.5–8.1)

## 2017-09-07 MED ORDER — SODIUM CHLORIDE 0.9 % IV SOLN
20.0000 mg | Freq: Once | INTRAVENOUS | Status: AC
  Administered 2017-09-07: 20 mg via INTRAVENOUS
  Filled 2017-09-07: qty 2

## 2017-09-07 MED ORDER — PACLITAXEL CHEMO INJECTION 300 MG/50ML
175.0000 mg/m2 | Freq: Once | INTRAVENOUS | Status: AC
  Administered 2017-09-07: 318 mg via INTRAVENOUS
  Filled 2017-09-07: qty 53

## 2017-09-07 MED ORDER — SODIUM CHLORIDE 0.9 % IV SOLN
399.0000 mg | Freq: Once | INTRAVENOUS | Status: AC
  Administered 2017-09-07: 400 mg via INTRAVENOUS
  Filled 2017-09-07: qty 40

## 2017-09-07 MED ORDER — DIPHENHYDRAMINE HCL 50 MG/ML IJ SOLN
50.0000 mg | Freq: Once | INTRAMUSCULAR | Status: AC
  Administered 2017-09-07: 50 mg via INTRAVENOUS

## 2017-09-07 MED ORDER — SODIUM CHLORIDE 0.9 % IV SOLN
Freq: Once | INTRAVENOUS | Status: AC
  Administered 2017-09-07: 10:00:00 via INTRAVENOUS

## 2017-09-07 MED ORDER — HEPARIN SOD (PORK) LOCK FLUSH 100 UNIT/ML IV SOLN
500.0000 [IU] | Freq: Once | INTRAVENOUS | Status: AC | PRN
  Administered 2017-09-07: 500 [IU]
  Filled 2017-09-07 (×2): qty 5

## 2017-09-07 MED ORDER — DIPHENHYDRAMINE HCL 50 MG/ML IJ SOLN
INTRAMUSCULAR | Status: AC
  Filled 2017-09-07: qty 1

## 2017-09-07 MED ORDER — SODIUM CHLORIDE 0.9% FLUSH
10.0000 mL | INTRAVENOUS | Status: DC | PRN
  Administered 2017-09-07: 10 mL
  Filled 2017-09-07: qty 10

## 2017-09-07 MED ORDER — PEGFILGRASTIM 6 MG/0.6ML ~~LOC~~ PSKT
6.0000 mg | PREFILLED_SYRINGE | Freq: Once | SUBCUTANEOUS | Status: AC
  Administered 2017-09-07: 6 mg via SUBCUTANEOUS
  Filled 2017-09-07: qty 0.6

## 2017-09-07 MED ORDER — FAMOTIDINE IN NACL 20-0.9 MG/50ML-% IV SOLN
20.0000 mg | Freq: Once | INTRAVENOUS | Status: AC
  Administered 2017-09-07: 20 mg via INTRAVENOUS

## 2017-09-07 MED ORDER — PALONOSETRON HCL INJECTION 0.25 MG/5ML
0.2500 mg | Freq: Once | INTRAVENOUS | Status: AC
  Administered 2017-09-07: 0.25 mg via INTRAVENOUS

## 2017-09-07 MED ORDER — PALONOSETRON HCL INJECTION 0.25 MG/5ML
INTRAVENOUS | Status: AC
  Filled 2017-09-07: qty 5

## 2017-09-07 MED ORDER — FAMOTIDINE IN NACL 20-0.9 MG/50ML-% IV SOLN
INTRAVENOUS | Status: AC
  Filled 2017-09-07: qty 50

## 2017-09-07 NOTE — Patient Instructions (Signed)
[place] at [facility] Discharge Instructions  RECOMMENDATIONS MADE BY THE CONSULTANT AND ANY TEST RESULTS WILL BE SENT TO YOUR REFERRING PHYSICIAN.  You were seen today by Dr. Twana First    Thank you for choosing [place] at [facility] to provide your oncology and hematology care.  To afford each patient quality time with our provider, please arrive at least 15 minutes before your scheduled appointment time.    If you have a lab appointment with the [facility] please come in thru the  Main Entrance and check in at the main information desk  You need to re-schedule your appointment should you arrive 10 or more minutes late.  We strive to give you quality time with our providers, and arriving late affects you and other patients whose appointments are after yours.  Also, if you no show three or more times for appointments you may be dismissed from the clinic at the providers discretion.     Again, thank you for choosing [facility].  Our hope is that these requests will decrease the amount of time that you wait before being seen by our physicians.       _____________________________________________________________  Should you have questions after your visit to [facility], please contact our office at [phone] between the hours of 8:30 a.m. and 4:30 p.m.  Voicemails left after 4:30 p.m. will not be returned until the following business day.  For prescription refill requests, have your pharmacy contact our office.       Resources For Cancer Patients and their Caregivers ? American Cancer Society: Can assist with transportation, wigs, general needs, runs Look Good Feel Better.        [phone] ? Cancer Care: Provides financial assistance, online support groups, medication/co-pay assistance.  1-800-813-HOPE 684-402-2060) ? [place] Assists [place] cancer patients and their  families through emotional , educational and financial support.  (512)685-9058 ? [place] DSS Where to apply for food stamps, Medicaid and utility assistance. 2516756746 ? RCATS: Transportation to medical appointments. 7056449978 ? Social Security Administration: May apply for disability if have a Stage IV cancer. 418-471-6406 [phone])206-7198 ? [employer], Disability and Transit Services: Assists with nutrition, care and transit needs. [place] Support Programs: @10RELATIVEDAYS @ > Cancer Support Group  2nd Tuesday of the month 1pm-2pm, Journey Room  > Creative Journey  3rd Tuesday of the month 1130am-1pm, Journey Room  > Look Good Feel Better  1st Wednesday of the month 10am-12 noon, Journey Room (Call [place] to register [phone])

## 2017-09-07 NOTE — Patient Instructions (Signed)
[facility] Discharge Instructions for Patients Receiving Chemotherapy  Today you received the following chemotherapy agents carbpoplatin, taxol, and neulasta onpro.   If you develop nausea and vomiting that is not controlled by your nausea medication, call the clinic.   BELOW ARE SYMPTOMS THAT SHOULD BE REPORTED IMMEDIATELY:  *FEVER GREATER THAN 100.5 F  *CHILLS WITH OR WITHOUT FEVER  NAUSEA AND VOMITING THAT IS NOT CONTROLLED WITH YOUR NAUSEA MEDICATION  *UNUSUAL SHORTNESS OF BREATH  *UNUSUAL BRUISING OR BLEEDING  TENDERNESS IN MOUTH AND THROAT WITH OR WITHOUT PRESENCE OF ULCERS  *URINARY PROBLEMS  *BOWEL PROBLEMS  UNUSUAL RASH Items with * indicate a potential emergency and should be followed up as soon as possible.  Feel free to call the clinic should you have any questions or concerns. The clinic phone number is [phone].  Please show the [place] at check-in to the Emergency Department and triage nurse.

## 2017-09-07 NOTE — Progress Notes (Signed)
HEMATOLOGY ONCOLOGY PROGRESS NOTE  Date of service: .09/07/2017  Patient Care Team: Patient, No Pcp Per as PCP - General (General Practice)   CC: f/u for Ovarian Cancer  SUMMARY OF ONCOLOGIC HISTORY:   Ovarian cancer ([place])   04/25/2015 - 04/28/2015 Hospital Admission    Nausea/diarrhea.  Oncology consult completed on 04/27/2015.      04/25/2015 Tumor Marker    CA 125- 3902.      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      04/27/2015 Procedure    US Paracentesis- A total of approximately 3700 mL of amber colored fluid was removed. A fluid sample was sent for laboratory analysis.      04/27/2015 Imaging    CT Chest- Mild left supraclavicular lymphadenopathy and moderate mediastinal lymphadenopathy involving the prevascular, subcarinal and bilateral pericardiophrenic nodal chains, likely metastatic.      04/27/2015 Pathology Results    Diagnosis PERITONEAL/ASCITIC FLUID(SPECIMEN 1 OF 1 COLLECTED 04/27/15): MALIGNANT CELLS CONSISTENT WITH METASTATIC ADENOCARCINOMA.  The immunophenotype is most consistent with a gynecologic primary, most likely ovary.      05/08/2015 Miscellaneous    Seen by Dr. Denman George- recommending Carboplatin/Paclitaxel x 3 cycles and return visit to see her (within 1 week of administration of third cycle) to evaluate for optimal sequencing of treatment modalities.      05/13/2015 - 05/15/2015 Hospital Admission    Hospitalized for AKI      05/19/2015 - 07/01/2015 Chemotherapy    Carboplatin/Paclitaxel x 3 cycles      07/28/2015 Surgery    Exploratory laparotomy with total abdominal hysterectomy, bilateral salpingo-oophorectomy, omentectomy radical tumor debulking for ovarian cancer; by Dr. Everitt Amber.      07/28/2015 Pathology Results    Uterus +/- tubes/ovaries, neoplastic, cervix - HIGH GRADE SEROUS  CARCINOMA INVOLVING LEFT OVARY, LEFT FALLOPIAN TUBE AND RIGHT FALLOPIAN TUBE. - CERVIX, ENDOMETRIUM AND MYOMETRIUM ARE FREE OF TUMOR. 2. Cul-de-sac biop...      08/26/2015 -  Chemotherapy    Carboplatin/Paclitaxel x 3 cycles      12/23/2015 Miscellaneous    Genetic counseling.  Genetic testing was normal, and did not reveal a deleterious mutation in these genes      03/07/2017 Imaging    CT C/A/P IMPRESSION: Chest Impression:  No evidence of metastatic disease in thorax.  Abdomen / Pelvis Impression:  1. Interval increase in size of solitary nodular peritoneal implant in the LEFT upper quadrant . 2. No additional evidence of peritoneal nodularity. 3. No ascites.      07/31/2017 Progression    CT C/A/P: IMPRESSION: 1. Left upper quadrant nodule continues to progress, now measuring 20 x 26 mm. 2. New nodule identified between the in adrenal gland, concerning for metastatic deposit. 3. Interval development of tiny nodules in the left lower quadrant mesenteric, concerning for metastases. 4. No ascites. 5. Status post total abdominal hysterectomy and omentectomy.       INTERVAL HISTORY:  Patient presents today with her son for continued follow-up as well as cycle 2 of carbo Doxil.  Patient tolerated cycle 1 of carbo/taxol very well.  She complained of achy bone pain in her legs secondary to Neulasta injection but otherwise had no other complaints.  She denies any fatigue, chest pain, shortness of breath, nausea, vomiting, diarrhea, focal weakness, neuropathy.  REVIEW OF SYSTEMS:    12  Point review of systems of done and is negative except as noted above.  . Past Medical History:  Diagnosis Date  . Arthritis   . B12 deficiency   . Cataract   . History of blood transfusion   . History of chemotherapy   . Hypertension   . Iron deficiency anemia   . Mixed hyperlipidemia   . Ovarian cancer ([place])   . 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 Schizophrenia ([place])   . 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 Vitamin D deficiency     . Past Surgical History:  Procedure Laterality Date  . ABDOMINAL HYSTERECTOMY N/A 07/28/2015   Procedure: TOTAL HYSTERECTOMY ABDOMINAL BILATERAL SALPINGO OOPHRORECTOMY;  Surgeon: Everitt Amber, MD;  Location: [facility];  Service: Gynecology;  Laterality: N/A;  . DEBULKING N/A 07/28/2015   Procedure: DEBULKING;  Surgeon: Everitt Amber, MD;  Location: [facility];  Service: Gynecology;  Laterality: N/A;  . LAPAROTOMY N/A 07/28/2015   Procedure: EXPLORATORY LAPAROTOMY;  Surgeon: Everitt Amber, MD;  Location: [facility];  Service: Gynecology;  Laterality: N/A;  . OMENTECTOMY N/A 07/28/2015   Procedure: OMENTECTOMY ;  Surgeon: Everitt Amber, MD;  Location: [facility];  Service: Gynecology;  Laterality: N/A;  . PARACENTESIS  04/27/15  . PORTACATH PLACEMENT Right 04/2015  . REPLACEMENT TOTAL KNEE     right knee in 2003    . Social History   Tobacco Use  . Smoking status: Never Smoker  . Smokeless tobacco: Never Used  Substance Use Topics  . Alcohol use: No  . Drug use: No    ALLERGIES:  has No Known Allergies.  MEDICATIONS:  Current Outpatient Medications  Medication Sig Dispense Refill  . acetaminophen (TYLENOL) 500 MG tablet Take 2 tablets (1,000 mg total) by mouth every 12 (twelve) hours. 30 tablet 0  . amLODipine (NORVASC) 10 MG tablet Take 10 mg by mouth every evening.     Marland Kitchen aspirin EC 81 MG tablet Take 81 mg by mouth daily.    Marland Kitchen CARBOPLATIN IV Inject into the vein. Every 3 weeks    . Cholecalciferol (VITAMIN D-3) 1000 UNITS CAPS Take 1,000 Units by mouth daily.     Marland Kitchen dexamethasone (DECADRON) 4 MG tablet Take 2 tablets (8 mg total) by mouth daily. Start the day after chemotherapy for 2 days. 30 tablet 1  . docusate sodium (COLACE) 100 MG capsule Take 100 mg by mouth 2 (two) times daily.    . folic acid (FOLVITE) 1 MG tablet Take 1 mg by mouth daily.    Marland Kitchen lidocaine-prilocaine (EMLA) cream Apply to affected area once 30 g 3  . lovastatin (MEVACOR) 10 MG tablet Take  10 mg by mouth at bedtime.    . metoprolol tartrate (LOPRESSOR) 25 MG tablet Take 1 tablet (25 mg total) by mouth 2 (two) times daily. 60 tablet 6  . mirtazapine (REMERON) 15 MG tablet Take 7.5 mg by mouth at bedtime.    . Multiple Vitamin (MULTIVITAMIN WITH MINERALS) TABS tablet Take 1 tablet by mouth daily.    . ondansetron (ZOFRAN) 8 MG tablet Take 1 tablet (8 mg total) by mouth 2 (two) times daily as needed for refractory nausea / vomiting. Start on day 3 after chemo. 30 tablet 1  . PACLitaxel (TAXOL) 300 MG/50ML injection Inject into the vein. Every 3 weeks    . prochlorperazine (COMPAZINE) 10 MG tablet Take 1 tablet (10 mg total) by mouth every 6 (six) hours as needed (Nausea or vomiting). 30 tablet 1  . QUEtiapine (  SEROQUEL XR) 300 MG 24 hr tablet Take 300 mg by mouth at bedtime.    . sulfaSALAzine (AZULFIDINE) 500 MG tablet Take 500 mg by mouth 3 (three) times daily.    . traMADol (ULTRAM) 50 MG tablet Take 1 tablet (50 mg total) by mouth every 6 (six) hours as needed. 90 tablet 1  . vitamin B-12 (CYANOCOBALAMIN) 1000 MCG tablet Take 1,000 mcg by mouth daily.     No current facility-administered medications for this visit.    Facility-Administered Medications Ordered in Other Visits  Medication Dose Route Frequency Provider Last Rate Last Dose  . CARBOplatin (PARAPLATIN) 400 mg in sodium chloride 0.9 % 250 mL chemo infusion  400 mg Intravenous Once Twana First, MD      . heparin lock flush 100 unit/mL  500 Units Intracatheter Once PRN Twana First, MD      . PACLitaxel (TAXOL) 318 mg in dextrose 5 % 500 mL chemo infusion (> 74m/m2)  175 mg/m2 (Treatment Plan Recorded) Intravenous Once ZTwana First MD 184 mL/hr at 09/07/17 1048 318 mg at 09/07/17 1048  . pegfilgrastim (NEULASTA ONPRO KIT) injection 6 mg  6 mg Subcutaneous Once ZTwana First MD      . sodium chloride flush (NS) 0.9 % injection 10 mL  10 mL Intracatheter PRN ZTwana First MD   10 mL at 09/07/17 0915    PHYSICAL  EXAMINATION: ECOG PERFORMANCE STATUS: 1 - Symptomatic but completely ambulatory  Chemo RN vitals reviewed.  Constitutional: Well-developed, well-nourished, and in no distress.   HENT:  Head: Normocephalic and atraumatic.  Mouth/Throat: No oropharyngeal exudate. Mucosa moist. Eyes: Pupils are equal, round, and reactive to light. Conjunctivae are normal. No scleral icterus.  Neck: Normal range of motion. Neck supple. No JVD present.  Cardiovascular: Normal rate, regular rhythm and normal heart sounds.  Exam reveals no gallop and no friction rub.   No murmur heard. Pulmonary/Chest: Effort normal and breath sounds normal. No respiratory distress. No wheezes.No rales.  Abdominal: Soft. Bowel sounds are normal. No distension. There is no tenderness. There is no guarding.  Musculoskeletal: No edema or tenderness.  Lymphadenopathy:    No cervical or supraclavicular adenopathy.  Neurological: Alert and oriented to person, place, and time. No cranial nerve deficit.  Skin: Skin is warm and dry. No rash noted. No erythema. No pallor.  Psychiatric: Affect and judgment normal.    LABORATORY DATA:   I have reviewed the data as listed  . CBC Latest Ref Rng & Units 09/07/2017 08/17/2017 07/27/2017  WBC 4.0 - 10.5 K/uL 5.3 4.9 4.2  Hemoglobin 12.0 - 15.0 g/dL 10.0(L) 11.2(L) 11.6(L)  Hematocrit 36.0 - 46.0 % 30.7(L) 35.0(L) 34.9(L)  Platelets 150 - 400 K/uL 289 283 286   . CBC    Component Value Date/Time   WBC 5.3 09/07/2017 0833   RBC 2.91 (L) 09/07/2017 0833   HGB 10.0 (L) 09/07/2017 0833   HCT 30.7 (L) 09/07/2017 0833   PLT 289 09/07/2017 0833   MCV 105.5 (H) 09/07/2017 0833   MCH 34.4 (H) 09/07/2017 0833   MCHC 32.6 09/07/2017 0833   RDW 14.3 09/07/2017 0833   LYMPHSABS 1.4 09/07/2017 0833   MONOABS 0.8 09/07/2017 0833   EOSABS 0.1 09/07/2017 0833   BASOSABS 0.0 09/07/2017 0833    CMP Latest Ref Rng & Units 09/07/2017 08/17/2017 07/27/2017  Glucose 65 - 99 mg/dL 102(H) 89  100(H)  BUN 6 - 20 mg/dL 14 13 10   Creatinine 0.44 - 1.00 mg/dL 1.02(H) 1.00 1.01(H)  Sodium 135 - 145 mmol/L 138 138 142  Potassium 3.5 - 5.1 mmol/L 4.2 4.5 3.8  Chloride 101 - 111 mmol/L 103 105 104  CO2 22 - 32 mmol/L 26 27 31   Calcium 8.9 - 10.3 mg/dL 9.4 9.5 9.1  Total Protein 6.5 - 8.1 g/dL 7.0 7.1 7.1  Total Bilirubin 0.3 - 1.2 mg/dL 0.5 0.4 0.7  Alkaline Phos 38 - 126 U/L 76 78 79  AST 15 - 41 U/L 28 29 29   ALT 14 - 54 U/L 24 25 28    . Lab Results  Component Value Date   CA125 47.3 (H) 03/03/2017     RADIOGRAPHIC STUDIES: I have personally reviewed the radiological images as listed and agreed with the findings in the report. No results found.  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 CT C/A/P11/12/18 demonstrated progressive disease with increase in size of previous LUQ peritoneal nodule, new left adrenal nodule and new perirectal nodule.   -Saw Dr. Denman George on 08/14/17 and she recommended salvage chemotherapy with platinum and taxane for at least 6 cycles followed by PARP inhibitor maintenance with Niraparib. -Tolerated cycle 1 of carbo/taxol well without any side effects. -Labs reviewed. Proceed with cycle 2 of chemo today. -Plan to restage after 3 cycles of carbo/taxol. -RTC for follow up with cycle 3 of carbo/taxol.    Twana First, MD 08/09/17

## 2017-09-07 NOTE — Progress Notes (Signed)
To treatment room for chemotherapy.  Patient stated prn tingling x 1 in fingertips.  Able to use zippers, small buttons, and open jar lids with no difficulty.  Fair energy level and appetite.  Family at side.    Labs reviewed by Dr. Talbert Cage and ok to treat today.   Patient tolerated chemotherapy with no complaints voiced.  Port site clean and dry with no bruising or swelling noted at site.  Neulasta onpro applied to right arm with green indicator light flashing.  Band aid applied to port site.  VSS with discharge and left ambulatory with family.  No s/s of distress noted.

## 2017-09-08 LAB — CA 125: Cancer Antigen (CA) 125: 68.3 U/mL — ABNORMAL HIGH (ref 0.0–38.1)

## 2017-09-27 ENCOUNTER — Other Ambulatory Visit (HOSPITAL_COMMUNITY): Payer: Self-pay | Admitting: *Deleted

## 2017-09-27 DIAGNOSIS — C569 Malignant neoplasm of unspecified ovary: Secondary | ICD-10-CM

## 2017-09-28 ENCOUNTER — Other Ambulatory Visit: Payer: Self-pay

## 2017-09-28 ENCOUNTER — Inpatient Hospital Stay (HOSPITAL_COMMUNITY): Payer: Medicare Other | Attending: Oncology

## 2017-09-28 ENCOUNTER — Encounter: Payer: Self-pay | Admitting: Oncology

## 2017-09-28 ENCOUNTER — Encounter (HOSPITAL_COMMUNITY): Payer: Medicare Other

## 2017-09-28 ENCOUNTER — Inpatient Hospital Stay (HOSPITAL_COMMUNITY): Payer: Medicare Other

## 2017-09-28 ENCOUNTER — Encounter (HOSPITAL_COMMUNITY): Payer: Self-pay

## 2017-09-28 VITALS — BP 139/74 | HR 90 | Temp 98.0°F | Resp 18 | Wt 163.6 lb

## 2017-09-28 DIAGNOSIS — E538 Deficiency of other specified B group vitamins: Secondary | ICD-10-CM | POA: Insufficient documentation

## 2017-09-28 DIAGNOSIS — I10 Essential (primary) hypertension: Secondary | ICD-10-CM | POA: Diagnosis not present

## 2017-09-28 DIAGNOSIS — Z5111 Encounter for antineoplastic chemotherapy: Secondary | ICD-10-CM | POA: Insufficient documentation

## 2017-09-28 DIAGNOSIS — E782 Mixed hyperlipidemia: Secondary | ICD-10-CM | POA: Insufficient documentation

## 2017-09-28 DIAGNOSIS — C7802 Secondary malignant neoplasm of left lung: Secondary | ICD-10-CM | POA: Diagnosis not present

## 2017-09-28 DIAGNOSIS — Z79899 Other long term (current) drug therapy: Secondary | ICD-10-CM | POA: Diagnosis not present

## 2017-09-28 DIAGNOSIS — M1990 Unspecified osteoarthritis, unspecified site: Secondary | ICD-10-CM | POA: Insufficient documentation

## 2017-09-28 DIAGNOSIS — C562 Malignant neoplasm of left ovary: Secondary | ICD-10-CM | POA: Diagnosis not present

## 2017-09-28 DIAGNOSIS — Z90710 Acquired absence of both cervix and uterus: Secondary | ICD-10-CM | POA: Insufficient documentation

## 2017-09-28 DIAGNOSIS — C786 Secondary malignant neoplasm of retroperitoneum and peritoneum: Secondary | ICD-10-CM | POA: Insufficient documentation

## 2017-09-28 DIAGNOSIS — C569 Malignant neoplasm of unspecified ovary: Secondary | ICD-10-CM

## 2017-09-28 LAB — CBC WITH DIFFERENTIAL/PLATELET
BASOS ABS: 0 10*3/uL (ref 0.0–0.1)
BASOS PCT: 1 %
Basophils Absolute: 0.1 10*3/uL (ref 0.0–0.1)
Basophils Relative: 1 %
EOS PCT: 1 %
Eosinophils Absolute: 0.1 10*3/uL (ref 0.0–0.7)
Eosinophils Absolute: 0.1 10*3/uL (ref 0.0–0.7)
Eosinophils Relative: 1 %
HEMATOCRIT: 29.8 % — AB (ref 36.0–46.0)
HEMATOCRIT: 31.2 % — AB (ref 36.0–46.0)
HEMOGLOBIN: 9.5 g/dL — AB (ref 12.0–15.0)
Hemoglobin: 10 g/dL — ABNORMAL LOW (ref 12.0–15.0)
LYMPHS PCT: 19 %
LYMPHS PCT: 20 %
Lymphs Abs: 1.2 10*3/uL (ref 0.7–4.0)
Lymphs Abs: 1 10*3/uL (ref 0.7–4.0)
MCH: 35.6 pg — AB (ref 26.0–34.0)
MCH: 35.2 pg — ABNORMAL HIGH (ref 26.0–34.0)
MCHC: 32.1 g/dL (ref 30.0–36.0)
MCHC: 31.9 g/dL (ref 30.0–36.0)
MCV: 111 fL — AB (ref 78.0–100.0)
MCV: 110.4 fL — ABNORMAL HIGH (ref 78.0–100.0)
MONO ABS: 1 10*3/uL (ref 0.1–1.0)
MONOS PCT: 17 %
Monocytes Absolute: 1 10*3/uL (ref 0.1–1.0)
Monocytes Relative: 18 %
NEUTROS ABS: 3.4 10*3/uL (ref 1.7–7.7)
NEUTROS PCT: 61 %
NEUTROS PCT: 61 %
Neutro Abs: 3.5 10*3/uL (ref 1.7–7.7)
Platelets: 401 10*3/uL — ABNORMAL HIGH (ref 150–400)
Platelets: 403 10*3/uL — ABNORMAL HIGH (ref 150–400)
RBC: 2.81 MIL/uL — ABNORMAL LOW (ref 3.87–5.11)
RBC: 2.7 MIL/uL — ABNORMAL LOW (ref 3.87–5.11)
RDW: 16.4 % — ABNORMAL HIGH (ref 11.5–15.5)
RDW: 16.6 % — AB (ref 11.5–15.5)
WBC: 5.9 10*3/uL (ref 4.0–10.5)
WBC: 5.5 10*3/uL (ref 4.0–10.5)

## 2017-09-28 LAB — COMPREHENSIVE METABOLIC PANEL
ALBUMIN: 3.9 g/dL (ref 3.5–5.0)
ALT: 21 U/L (ref 14–54)
ALT: 20 U/L (ref 14–54)
ANION GAP: 10 (ref 5–15)
AST: 23 U/L (ref 15–41)
AST: 24 U/L (ref 15–41)
Albumin: 4 g/dL (ref 3.5–5.0)
Alkaline Phosphatase: 82 U/L (ref 38–126)
Alkaline Phosphatase: 80 U/L (ref 38–126)
Anion gap: 7 (ref 5–15)
BILIRUBIN TOTAL: 0.4 mg/dL (ref 0.3–1.2)
BUN: 11 mg/dL (ref 6–20)
BUN: 12 mg/dL (ref 6–20)
CALCIUM: 9.5 mg/dL (ref 8.9–10.3)
CO2: 30 mmol/L (ref 22–32)
CO2: 26 mmol/L (ref 22–32)
CREATININE: 0.99 mg/dL (ref 0.44–1.00)
Calcium: 9.3 mg/dL (ref 8.9–10.3)
Chloride: 104 mmol/L (ref 101–111)
Chloride: 104 mmol/L (ref 101–111)
Creatinine, Ser: 0.95 mg/dL (ref 0.44–1.00)
GFR calc non Af Amer: 58 mL/min — ABNORMAL LOW (ref >60)
GFR, EST NON AFRICAN AMERICAN: 55 mL/min — AB (ref >60)
GLUCOSE: 108 mg/dL — AB (ref 65–99)
Glucose, Bld: 83 mg/dL (ref 65–99)
Potassium: 3.7 mmol/L (ref 3.5–5.1)
Potassium: 3.8 mmol/L (ref 3.5–5.1)
Sodium: 141 mmol/L (ref 135–145)
Sodium: 140 mmol/L (ref 135–145)
TOTAL PROTEIN: 6.8 g/dL (ref 6.5–8.1)
Total Bilirubin: 0.3 mg/dL (ref 0.3–1.2)
Total Protein: 7.1 g/dL (ref 6.5–8.1)

## 2017-09-28 MED ORDER — DIPHENHYDRAMINE HCL 50 MG/ML IJ SOLN
INTRAMUSCULAR | Status: AC
  Filled 2017-09-28: qty 1

## 2017-09-28 MED ORDER — FAMOTIDINE IN NACL 20-0.9 MG/50ML-% IV SOLN
20.0000 mg | Freq: Once | INTRAVENOUS | Status: AC
  Administered 2017-09-28: 20 mg via INTRAVENOUS

## 2017-09-28 MED ORDER — PACLITAXEL CHEMO INJECTION 300 MG/50ML
175.0000 mg/m2 | Freq: Once | INTRAVENOUS | Status: AC
  Administered 2017-09-28: 318 mg via INTRAVENOUS
  Filled 2017-09-28: qty 53

## 2017-09-28 MED ORDER — SODIUM CHLORIDE 0.9 % IV SOLN
404.5000 mg | Freq: Once | INTRAVENOUS | Status: AC
  Administered 2017-09-28: 400 mg via INTRAVENOUS
  Filled 2017-09-28: qty 40

## 2017-09-28 MED ORDER — DIPHENHYDRAMINE HCL 50 MG/ML IJ SOLN
50.0000 mg | Freq: Once | INTRAMUSCULAR | Status: AC
  Administered 2017-09-28: 50 mg via INTRAVENOUS

## 2017-09-28 MED ORDER — SODIUM CHLORIDE 0.9 % IV SOLN
20.0000 mg | Freq: Once | INTRAVENOUS | Status: AC
  Administered 2017-09-28: 20 mg via INTRAVENOUS
  Filled 2017-09-28: qty 2

## 2017-09-28 MED ORDER — HEPARIN SOD (PORK) LOCK FLUSH 100 UNIT/ML IV SOLN
500.0000 [IU] | Freq: Once | INTRAVENOUS | Status: AC | PRN
  Administered 2017-09-28: 500 [IU]

## 2017-09-28 MED ORDER — FAMOTIDINE IN NACL 20-0.9 MG/50ML-% IV SOLN
INTRAVENOUS | Status: AC
  Filled 2017-09-28: qty 50

## 2017-09-28 MED ORDER — PALONOSETRON HCL INJECTION 0.25 MG/5ML
0.2500 mg | Freq: Once | INTRAVENOUS | Status: AC
  Administered 2017-09-28: 0.25 mg via INTRAVENOUS

## 2017-09-28 MED ORDER — PEGFILGRASTIM 6 MG/0.6ML ~~LOC~~ PSKT
6.0000 mg | PREFILLED_SYRINGE | Freq: Once | SUBCUTANEOUS | Status: AC
  Administered 2017-09-28: 6 mg via SUBCUTANEOUS

## 2017-09-28 MED ORDER — PEGFILGRASTIM 6 MG/0.6ML ~~LOC~~ PSKT
PREFILLED_SYRINGE | SUBCUTANEOUS | Status: AC
  Filled 2017-09-28: qty 0.6

## 2017-09-28 MED ORDER — PALONOSETRON HCL INJECTION 0.25 MG/5ML
INTRAVENOUS | Status: AC
  Filled 2017-09-28: qty 5

## 2017-09-28 MED ORDER — SODIUM CHLORIDE 0.9 % IV SOLN
Freq: Once | INTRAVENOUS | Status: AC
  Administered 2017-09-28: 10:00:00 via INTRAVENOUS

## 2017-09-28 NOTE — Progress Notes (Signed)
Neulasta OBI device filled per protocol and placed on  right upper arm.  Needle/catheter placement noted prior to patient leaving. Tolerated without incident and aware of injection to be delivered in 27 hours. Tolerated infusions w/o adverse reaction.  Alert, in no distress.  VSS.  Discharged ambulatory in c/o son.

## 2017-09-29 LAB — CA 125: Cancer Antigen (CA) 125: 23.9 U/mL (ref 0.0–38.1)

## 2017-10-18 ENCOUNTER — Other Ambulatory Visit (HOSPITAL_COMMUNITY): Payer: Self-pay

## 2017-10-18 DIAGNOSIS — C569 Malignant neoplasm of unspecified ovary: Secondary | ICD-10-CM

## 2017-10-19 ENCOUNTER — Inpatient Hospital Stay (HOSPITAL_COMMUNITY): Payer: Medicare Other

## 2017-10-19 ENCOUNTER — Encounter: Payer: Self-pay | Admitting: Oncology

## 2017-10-19 ENCOUNTER — Other Ambulatory Visit: Payer: Self-pay

## 2017-10-19 ENCOUNTER — Ambulatory Visit (HOSPITAL_COMMUNITY): Payer: Medicare Other | Admitting: Internal Medicine

## 2017-10-19 ENCOUNTER — Inpatient Hospital Stay (HOSPITAL_BASED_OUTPATIENT_CLINIC_OR_DEPARTMENT_OTHER): Payer: Medicare Other | Admitting: Oncology

## 2017-10-19 ENCOUNTER — Encounter (HOSPITAL_COMMUNITY): Payer: Self-pay | Admitting: Oncology

## 2017-10-19 VITALS — BP 150/79 | HR 77 | Temp 97.5°F | Resp 18 | Wt 161.2 lb

## 2017-10-19 VITALS — BP 149/70 | HR 85 | Temp 97.2°F | Resp 18 | Wt 161.2 lb

## 2017-10-19 DIAGNOSIS — C786 Secondary malignant neoplasm of retroperitoneum and peritoneum: Secondary | ICD-10-CM | POA: Diagnosis not present

## 2017-10-19 DIAGNOSIS — Z79899 Other long term (current) drug therapy: Secondary | ICD-10-CM | POA: Diagnosis not present

## 2017-10-19 DIAGNOSIS — C562 Malignant neoplasm of left ovary: Secondary | ICD-10-CM | POA: Diagnosis not present

## 2017-10-19 DIAGNOSIS — C569 Malignant neoplasm of unspecified ovary: Secondary | ICD-10-CM

## 2017-10-19 DIAGNOSIS — Z90710 Acquired absence of both cervix and uterus: Secondary | ICD-10-CM | POA: Diagnosis not present

## 2017-10-19 DIAGNOSIS — C7802 Secondary malignant neoplasm of left lung: Secondary | ICD-10-CM

## 2017-10-19 DIAGNOSIS — Z5111 Encounter for antineoplastic chemotherapy: Secondary | ICD-10-CM | POA: Diagnosis not present

## 2017-10-19 LAB — CBC WITH DIFFERENTIAL/PLATELET
BASOS ABS: 0 10*3/uL (ref 0.0–0.1)
Basophils Relative: 1 %
EOS ABS: 0.1 10*3/uL (ref 0.0–0.7)
EOS PCT: 3 %
HCT: 30.6 % — ABNORMAL LOW (ref 36.0–46.0)
Hemoglobin: 9.8 g/dL — ABNORMAL LOW (ref 12.0–15.0)
LYMPHS ABS: 0.8 10*3/uL (ref 0.7–4.0)
Lymphocytes Relative: 20 %
MCH: 36 pg — ABNORMAL HIGH (ref 26.0–34.0)
MCHC: 32 g/dL (ref 30.0–36.0)
MCV: 112.5 fL — AB (ref 78.0–100.0)
MONO ABS: 0.7 10*3/uL (ref 0.1–1.0)
Monocytes Relative: 18 %
NEUTROS PCT: 58 %
Neutro Abs: 2.5 10*3/uL (ref 1.7–7.7)
PLATELETS: 338 10*3/uL (ref 150–400)
RBC: 2.72 MIL/uL — AB (ref 3.87–5.11)
RDW: 15.2 % (ref 11.5–15.5)
WBC: 4.1 10*3/uL (ref 4.0–10.5)

## 2017-10-19 LAB — LACTATE DEHYDROGENASE: LDH: 169 U/L (ref 98–192)

## 2017-10-19 LAB — COMPREHENSIVE METABOLIC PANEL
ALT: 20 U/L (ref 14–54)
AST: 22 U/L (ref 15–41)
Albumin: 4.1 g/dL (ref 3.5–5.0)
Alkaline Phosphatase: 79 U/L (ref 38–126)
Anion gap: 8 (ref 5–15)
BUN: 12 mg/dL (ref 6–20)
CHLORIDE: 104 mmol/L (ref 101–111)
CO2: 28 mmol/L (ref 22–32)
Calcium: 9.4 mg/dL (ref 8.9–10.3)
Creatinine, Ser: 0.93 mg/dL (ref 0.44–1.00)
GFR calc Af Amer: >60 mL/min (ref >60)
GFR, EST NON AFRICAN AMERICAN: 59 mL/min — AB (ref >60)
Glucose, Bld: 100 mg/dL — ABNORMAL HIGH (ref 65–99)
POTASSIUM: 4.4 mmol/L (ref 3.5–5.1)
SODIUM: 140 mmol/L (ref 135–145)
Total Bilirubin: 0.7 mg/dL (ref 0.3–1.2)
Total Protein: 6.8 g/dL (ref 6.5–8.1)

## 2017-10-19 MED ORDER — SODIUM CHLORIDE 0.9 % IV SOLN
Freq: Once | INTRAVENOUS | Status: AC
  Administered 2017-10-19: 10:00:00 via INTRAVENOUS

## 2017-10-19 MED ORDER — DIPHENHYDRAMINE HCL 50 MG/ML IJ SOLN
INTRAMUSCULAR | Status: AC
  Filled 2017-10-19: qty 1

## 2017-10-19 MED ORDER — SODIUM CHLORIDE 0.9 % IV SOLN
404.5000 mg | Freq: Once | INTRAVENOUS | Status: AC
  Administered 2017-10-19: 400 mg via INTRAVENOUS
  Filled 2017-10-19: qty 40

## 2017-10-19 MED ORDER — DIPHENHYDRAMINE HCL 50 MG/ML IJ SOLN
50.0000 mg | Freq: Once | INTRAMUSCULAR | Status: AC
  Administered 2017-10-19: 50 mg via INTRAVENOUS

## 2017-10-19 MED ORDER — PACLITAXEL CHEMO INJECTION 300 MG/50ML
175.0000 mg/m2 | Freq: Once | INTRAVENOUS | Status: AC
  Administered 2017-10-19: 318 mg via INTRAVENOUS
  Filled 2017-10-19: qty 53

## 2017-10-19 MED ORDER — PALONOSETRON HCL INJECTION 0.25 MG/5ML
0.2500 mg | Freq: Once | INTRAVENOUS | Status: AC
  Administered 2017-10-19: 0.25 mg via INTRAVENOUS

## 2017-10-19 MED ORDER — HEPARIN SOD (PORK) LOCK FLUSH 100 UNIT/ML IV SOLN
500.0000 [IU] | Freq: Once | INTRAVENOUS | Status: AC | PRN
  Administered 2017-10-19: 500 [IU]

## 2017-10-19 MED ORDER — FAMOTIDINE IN NACL 20-0.9 MG/50ML-% IV SOLN
20.0000 mg | Freq: Once | INTRAVENOUS | Status: AC
  Administered 2017-10-19: 20 mg via INTRAVENOUS

## 2017-10-19 MED ORDER — PEGFILGRASTIM 6 MG/0.6ML ~~LOC~~ PSKT
6.0000 mg | PREFILLED_SYRINGE | Freq: Once | SUBCUTANEOUS | Status: AC
  Administered 2017-10-19: 6 mg via SUBCUTANEOUS
  Filled 2017-10-19: qty 0.6

## 2017-10-19 MED ORDER — FAMOTIDINE IN NACL 20-0.9 MG/50ML-% IV SOLN
INTRAVENOUS | Status: AC
  Filled 2017-10-19: qty 50

## 2017-10-19 MED ORDER — SODIUM CHLORIDE 0.9 % IV SOLN
20.0000 mg | Freq: Once | INTRAVENOUS | Status: AC
  Administered 2017-10-19: 20 mg via INTRAVENOUS
  Filled 2017-10-19: qty 2

## 2017-10-19 MED ORDER — PALONOSETRON HCL INJECTION 0.25 MG/5ML
INTRAVENOUS | Status: AC
  Filled 2017-10-19: qty 5

## 2017-10-19 NOTE — Progress Notes (Addendum)
HEMATOLOGY ONCOLOGY PROGRESS NOTE  Date of service: .10/19/2017  Patient Care Team: Patient, No Pcp Per as PCP - General (General Practice)   CC: f/u for Ovarian Cancer  SUMMARY OF ONCOLOGIC HISTORY:   Ovarian cancer ([place])   04/25/2015 - 04/28/2015 Hospital Admission    Nausea/diarrhea.  Oncology consult completed on 04/27/2015.      04/25/2015 Tumor Marker    CA 125- 3902.      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      04/27/2015 Procedure    US Paracentesis- A total of approximately 3700 mL of amber colored fluid was removed. A fluid sample was sent for laboratory analysis.      04/27/2015 Imaging    CT Chest- Mild left supraclavicular lymphadenopathy and moderate mediastinal lymphadenopathy involving the prevascular, subcarinal and bilateral pericardiophrenic nodal chains, likely metastatic.      04/27/2015 Pathology Results    Diagnosis PERITONEAL/ASCITIC FLUID(SPECIMEN 1 OF 1 COLLECTED 04/27/15): MALIGNANT CELLS CONSISTENT WITH METASTATIC ADENOCARCINOMA.  The immunophenotype is most consistent with a gynecologic primary, most likely ovary.      05/08/2015 Miscellaneous    Seen by Dr. Denman George- recommending Carboplatin/Paclitaxel x 3 cycles and return visit to see her (within 1 week of administration of third cycle) to evaluate for optimal sequencing of treatment modalities.      05/13/2015 - 05/15/2015 Hospital Admission    Hospitalized for AKI      05/19/2015 - 07/01/2015 Chemotherapy    Carboplatin/Paclitaxel x 3 cycles      07/28/2015 Surgery    Exploratory laparotomy with total abdominal hysterectomy, bilateral salpingo-oophorectomy, omentectomy radical tumor debulking for ovarian cancer; by Dr. Everitt Amber.      07/28/2015 Pathology Results    Uterus +/- tubes/ovaries, neoplastic, cervix - HIGH GRADE SEROUS  CARCINOMA INVOLVING LEFT OVARY, LEFT FALLOPIAN TUBE AND RIGHT FALLOPIAN TUBE. - CERVIX, ENDOMETRIUM AND MYOMETRIUM ARE FREE OF TUMOR. 2. Cul-de-sac biop...      08/26/2015 -  Chemotherapy    Carboplatin/Paclitaxel x 3 cycles      12/23/2015 Miscellaneous    Genetic counseling.  Genetic testing was normal, and did not reveal a deleterious mutation in these genes      03/07/2017 Imaging    CT C/A/P IMPRESSION: Chest Impression:  No evidence of metastatic disease in thorax.  Abdomen / Pelvis Impression:  1. Interval increase in size of solitary nodular peritoneal implant in the LEFT upper quadrant . 2. No additional evidence of peritoneal nodularity. 3. No ascites.      07/31/2017 Progression    CT C/A/P: IMPRESSION: 1. Left upper quadrant nodule continues to progress, now measuring 20 x 26 mm. 2. New nodule identified between the in adrenal gland, concerning for metastatic deposit. 3. Interval development of tiny nodules in the left lower quadrant mesenteric, concerning for metastases. 4. No ascites. 5. Status post total abdominal hysterectomy and omentectomy.       INTERVAL HISTORY:  Patient presents today with her son for continued follow-up and cycle 4 of carbo Taxol.  Patient has tolerated cycle 1-3 well.  She continues to have pain secondary to Neulasta injection.  She complains of occasional nausea for a couple days after treatment as well as fatigue but this improves within the first week. She denies chest pain, shortness of breath, nausea, vomiting, diarrhea.  REVIEW OF SYSTEMS:  12 Point review of systems of done and is negative except as noted above.  . Past Medical History:  Diagnosis Date  . Arthritis   . B12 deficiency   . Cataract   . History of blood transfusion   . History of chemotherapy   . Hypertension   . Iron deficiency anemia   . Mixed hyperlipidemia   . Ovarian cancer ([place])   . 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 Schizophrenia ([place])   .  Ulcerative colitis ([place])   . Vitamin D deficiency     . Past Surgical History:  Procedure Laterality Date  . ABDOMINAL HYSTERECTOMY N/A 07/28/2015   Procedure: TOTAL HYSTERECTOMY ABDOMINAL BILATERAL SALPINGO OOPHRORECTOMY;  Surgeon: Everitt Amber, MD;  Location: [facility];  Service: Gynecology;  Laterality: N/A;  . DEBULKING N/A 07/28/2015   Procedure: DEBULKING;  Surgeon: Everitt Amber, MD;  Location: [facility];  Service: Gynecology;  Laterality: N/A;  . LAPAROTOMY N/A 07/28/2015   Procedure: EXPLORATORY LAPAROTOMY;  Surgeon: Everitt Amber, MD;  Location: [facility];  Service: Gynecology;  Laterality: N/A;  . OMENTECTOMY N/A 07/28/2015   Procedure: OMENTECTOMY ;  Surgeon: Everitt Amber, MD;  Location: [facility];  Service: Gynecology;  Laterality: N/A;  . PARACENTESIS  04/27/15  . PORTACATH PLACEMENT Right 04/2015  . REPLACEMENT TOTAL KNEE     right knee in 2003    . Social History   Tobacco Use  . Smoking status: Never Smoker  . Smokeless tobacco: Never Used  Substance Use Topics  . Alcohol use: No  . Drug use: No    ALLERGIES:  has No Known Allergies.  MEDICATIONS:  Current Outpatient Medications  Medication Sig Dispense Refill  . acetaminophen (TYLENOL) 500 MG tablet Take 2 tablets (1,000 mg total) by mouth every 12 (twelve) hours. 30 tablet 0  . amLODipine (NORVASC) 10 MG tablet Take 10 mg by mouth every evening.     Marland Kitchen aspirin EC 81 MG tablet Take 81 mg by mouth daily.    Marland Kitchen CARBOPLATIN IV Inject into the vein. Every 3 weeks    . Cholecalciferol (VITAMIN D-3) 1000 UNITS CAPS Take 1,000 Units by mouth daily.     Marland Kitchen dexamethasone (DECADRON) 4 MG tablet Take 2 tablets (8 mg total) by mouth daily. Start the day after chemotherapy for 2 days. 30 tablet 1  . docusate sodium (COLACE) 100 MG capsule Take 100 mg by mouth 2 (two) times daily.    . folic acid (FOLVITE) 1 MG tablet Take 1 mg by mouth daily.    Marland Kitchen lidocaine-prilocaine (EMLA) cream Apply to affected area once 30 g 3  . lovastatin (MEVACOR) 10 MG  tablet Take 10 mg by mouth at bedtime.    . metoprolol tartrate (LOPRESSOR) 25 MG tablet Take 1 tablet (25 mg total) by mouth 2 (two) times daily. 60 tablet 6  . mirtazapine (REMERON) 15 MG tablet Take 7.5 mg by mouth at bedtime.    . Multiple Vitamin (MULTIVITAMIN WITH MINERALS) TABS tablet Take 1 tablet by mouth daily.    . ondansetron (ZOFRAN) 8 MG tablet Take 1 tablet (8 mg total) by mouth 2 (two) times daily as needed for refractory nausea / vomiting. Start on day 3 after chemo. 30 tablet 1  . PACLitaxel (TAXOL) 300 MG/50ML injection Inject into the vein. Every 3 weeks    . prochlorperazine (COMPAZINE) 10 MG tablet Take 1 tablet (10 mg total) by mouth every 6 (six) hours as needed (Nausea or vomiting). 30 tablet 1  .  QUEtiapine (SEROQUEL XR) 300 MG 24 hr tablet Take 300 mg by mouth at bedtime.    . sulfaSALAzine (AZULFIDINE) 500 MG tablet Take 500 mg by mouth 3 (three) times daily.    . traMADol (ULTRAM) 50 MG tablet Take 1 tablet (50 mg total) by mouth every 6 (six) hours as needed. 90 tablet 1  . vitamin B-12 (CYANOCOBALAMIN) 1000 MCG tablet Take 1,000 mcg by mouth daily.     No current facility-administered medications for this visit.     PHYSICAL EXAMINATION: ECOG PERFORMANCE STATUS: 1 - Symptomatic but completely ambulatory  Chemo RN vitals reviewed.  Constitutional: Well-developed, well-nourished, and in no distress.   HENT:  Head: Normocephalic and atraumatic.  Mouth/Throat: No oropharyngeal exudate. Mucosa moist. Eyes: Pupils are equal, round, and reactive to light. Conjunctivae are normal. No scleral icterus.  Neck: Normal range of motion. Neck supple. No JVD present.  Cardiovascular: Normal rate, regular rhythm and normal heart sounds.  Exam reveals no gallop and no friction rub.   No murmur heard. Pulmonary/Chest: Effort normal and breath sounds normal. No respiratory distress. No wheezes.No rales.  Abdominal: Soft. Bowel sounds are normal. No distension. There is no  tenderness. There is no guarding.  Musculoskeletal: No edema or tenderness.  Lymphadenopathy:    No cervical or supraclavicular adenopathy.  Neurological: Alert and oriented to person, place, and time. No cranial nerve deficit.  Skin: Skin is warm and dry. No rash noted. No erythema. No pallor.  Psychiatric: Affect and judgment normal.    LABORATORY DATA:   I have reviewed the data as listed  . CBC Latest Ref Rng & Units 10/19/2017 09/28/2017 09/28/2017  WBC 4.0 - 10.5 K/uL 4.1 5.5 5.9  Hemoglobin 12.0 - 15.0 g/dL 9.8(L) 9.5(L) 10.0(L)  Hematocrit 36.0 - 46.0 % 30.6(L) 29.8(L) 31.2(L)  Platelets 150 - 400 K/uL 338 403(H) 401(H)   . CBC    Component Value Date/Time   WBC 4.1 10/19/2017 0821   RBC 2.72 (L) 10/19/2017 0821   HGB 9.8 (L) 10/19/2017 0821   HCT 30.6 (L) 10/19/2017 0821   PLT 338 10/19/2017 0821   MCV 112.5 (H) 10/19/2017 0821   MCH 36.0 (H) 10/19/2017 0821   MCHC 32.0 10/19/2017 0821   RDW 15.2 10/19/2017 0821   LYMPHSABS 0.8 10/19/2017 0821   MONOABS 0.7 10/19/2017 0821   EOSABS 0.1 10/19/2017 0821   BASOSABS 0.0 10/19/2017 0821    CMP Latest Ref Rng & Units 10/19/2017 09/28/2017 09/28/2017  Glucose 65 - 99 mg/dL 100(H) 108(H) 83  BUN 6 - 20 mg/dL 12 12 11   Creatinine 0.44 - 1.00 mg/dL 0.93 0.95 0.99  Sodium 135 - 145 mmol/L 140 140 141  Potassium 3.5 - 5.1 mmol/L 4.4 3.8 3.7  Chloride 101 - 111 mmol/L 104 104 104  CO2 22 - 32 mmol/L 28 26 30   Calcium 8.9 - 10.3 mg/dL 9.4 9.3 9.5  Total Protein 6.5 - 8.1 g/dL 6.8 6.8 7.1  Total Bilirubin 0.3 - 1.2 mg/dL 0.7 0.4 0.3  Alkaline Phos 38 - 126 U/L 79 80 82  AST 15 - 41 U/L 22 24 23   ALT 14 - 54 U/L 20 20 21    . Lab Results  Component Value Date   CA125 47.3 (H) 03/03/2017     RADIOGRAPHIC STUDIES: I have personally reviewed the radiological images as listed and agreed with the findings in the report. No results found.  ASSESSMENT & PLAN:   1) h/o Stage IVB Ovarian Carcinoma With previous h/o  Malignant Ascites and pulmonary metastases. CT C/A/P11/12/18 demonstrated progressive disease with increase in size of previous LUQ peritoneal nodule, new left adrenal nodule and new perirectal nodule.   -Saw Dr. Denman George on 08/14/17 and she recommended salvage chemotherapy with platinum and taxane for at least 6 cycles followed by PARP inhibitor maintenance with Niraparib. -Has tolerated Cycles 1-3 of carbo/taxol well with mild side effects. Admits to nausea and muscle/body aches relieved with prescribed medications.  -Labs reviewed. Proceed with cycle 4 of chemo today. She remains anemic (9.6) but is asymptomatic. Will continue to monitor this. Counseled on progressive cytopenia's and to let us know if she develops severe fatigue, weakness, neuropathy or intractable nausea. -Need restaging scans ASAP. Will get those scheduled today.  -RTC in 3 weeks for labs and MD assessment and Results from CT scans  Addendum: CA 125 trending down. Today 11.3.  Greater than 50% was spent in counseling and coordination of care with this patient including but not limited to discussion of the relevant topics above (See A&P) including, but not limited to diagnosis and management of acute and chronic medical conditions.   Faythe Casa, NP 10/19/2017 3:34 PM

## 2017-10-20 LAB — CA 125: Cancer Antigen (CA) 125: 11.3 U/mL (ref 0.0–38.1)

## 2017-10-30 ENCOUNTER — Ambulatory Visit (HOSPITAL_COMMUNITY)
Admission: RE | Admit: 2017-10-30 | Discharge: 2017-10-30 | Disposition: A | Discharge status: Home / Self Care | Payer: Medicare Other | Source: Ambulatory Visit | Attending: Oncology | Admitting: Oncology

## 2017-10-30 DIAGNOSIS — N8110 Cystocele, unspecified: Secondary | ICD-10-CM | POA: Diagnosis not present

## 2017-10-30 DIAGNOSIS — I700 Atherosclerosis of aorta: Secondary | ICD-10-CM | POA: Diagnosis not present

## 2017-10-30 DIAGNOSIS — C569 Malignant neoplasm of unspecified ovary: Secondary | ICD-10-CM | POA: Insufficient documentation

## 2017-10-30 MED ORDER — IOPAMIDOL (ISOVUE-300) INJECTION 61%
100.0000 mL | Freq: Once | INTRAVENOUS | Status: AC | PRN
  Administered 2017-10-30: 100 mL via INTRAVENOUS

## 2017-11-08 ENCOUNTER — Other Ambulatory Visit (HOSPITAL_COMMUNITY): Payer: Self-pay | Admitting: *Deleted

## 2017-11-09 ENCOUNTER — Inpatient Hospital Stay (HOSPITAL_BASED_OUTPATIENT_CLINIC_OR_DEPARTMENT_OTHER): Payer: Medicare Other | Admitting: Internal Medicine

## 2017-11-09 ENCOUNTER — Encounter (HOSPITAL_COMMUNITY): Payer: Self-pay

## 2017-11-09 ENCOUNTER — Inpatient Hospital Stay (HOSPITAL_COMMUNITY): Payer: Medicare Other

## 2017-11-09 ENCOUNTER — Encounter (HOSPITAL_COMMUNITY): Payer: Self-pay | Admitting: Internal Medicine

## 2017-11-09 ENCOUNTER — Inpatient Hospital Stay (HOSPITAL_COMMUNITY): Payer: Medicare Other | Attending: Internal Medicine

## 2017-11-09 VITALS — BP 128/63 | HR 70 | Temp 98.0°F | Resp 18 | Wt 161.4 lb

## 2017-11-09 VITALS — BP 150/69 | HR 79 | Temp 97.6°F | Resp 18 | Wt 161.4 lb

## 2017-11-09 DIAGNOSIS — Z79899 Other long term (current) drug therapy: Secondary | ICD-10-CM | POA: Diagnosis not present

## 2017-11-09 DIAGNOSIS — C569 Malignant neoplasm of unspecified ovary: Secondary | ICD-10-CM

## 2017-11-09 DIAGNOSIS — E782 Mixed hyperlipidemia: Secondary | ICD-10-CM | POA: Diagnosis not present

## 2017-11-09 DIAGNOSIS — C562 Malignant neoplasm of left ovary: Secondary | ICD-10-CM | POA: Diagnosis not present

## 2017-11-09 DIAGNOSIS — Z5111 Encounter for antineoplastic chemotherapy: Secondary | ICD-10-CM | POA: Insufficient documentation

## 2017-11-09 DIAGNOSIS — M1990 Unspecified osteoarthritis, unspecified site: Secondary | ICD-10-CM | POA: Diagnosis not present

## 2017-11-09 DIAGNOSIS — C786 Secondary malignant neoplasm of retroperitoneum and peritoneum: Secondary | ICD-10-CM | POA: Insufficient documentation

## 2017-11-09 DIAGNOSIS — E538 Deficiency of other specified B group vitamins: Secondary | ICD-10-CM | POA: Diagnosis not present

## 2017-11-09 DIAGNOSIS — Z90710 Acquired absence of both cervix and uterus: Secondary | ICD-10-CM

## 2017-11-09 DIAGNOSIS — C7802 Secondary malignant neoplasm of left lung: Secondary | ICD-10-CM | POA: Insufficient documentation

## 2017-11-09 DIAGNOSIS — I10 Essential (primary) hypertension: Secondary | ICD-10-CM | POA: Diagnosis not present

## 2017-11-09 LAB — CBC WITH DIFFERENTIAL/PLATELET
BASOS PCT: 1 %
Basophils Absolute: 0 10*3/uL (ref 0.0–0.1)
EOS PCT: 3 %
Eosinophils Absolute: 0.1 10*3/uL (ref 0.0–0.7)
HEMATOCRIT: 30.2 % — AB (ref 36.0–46.0)
Hemoglobin: 9.8 g/dL — ABNORMAL LOW (ref 12.0–15.0)
LYMPHS ABS: 1.2 10*3/uL (ref 0.7–4.0)
Lymphocytes Relative: 25 %
MCH: 36.6 pg — AB (ref 26.0–34.0)
MCHC: 32.5 g/dL (ref 30.0–36.0)
MCV: 112.7 fL — AB (ref 78.0–100.0)
MONOS PCT: 19 %
Monocytes Absolute: 0.9 10*3/uL (ref 0.1–1.0)
Neutro Abs: 2.5 10*3/uL (ref 1.7–7.7)
Neutrophils Relative %: 52 %
Platelets: 368 10*3/uL (ref 150–400)
RBC: 2.68 MIL/uL — ABNORMAL LOW (ref 3.87–5.11)
RDW: 11.8 % (ref 11.5–15.5)
WBC: 4.7 10*3/uL (ref 4.0–10.5)

## 2017-11-09 LAB — COMPREHENSIVE METABOLIC PANEL
ALK PHOS: 80 U/L (ref 38–126)
ALT: 19 U/L (ref 14–54)
AST: 24 U/L (ref 15–41)
Albumin: 4.2 g/dL (ref 3.5–5.0)
Anion gap: 9 (ref 5–15)
BUN: 10 mg/dL (ref 6–20)
CHLORIDE: 104 mmol/L (ref 101–111)
CO2: 27 mmol/L (ref 22–32)
CREATININE: 0.95 mg/dL (ref 0.44–1.00)
Calcium: 9.3 mg/dL (ref 8.9–10.3)
GFR calc Af Amer: >60 mL/min (ref >60)
GFR calc non Af Amer: 58 mL/min — ABNORMAL LOW (ref >60)
Glucose, Bld: 92 mg/dL (ref 65–99)
Potassium: 4.6 mmol/L (ref 3.5–5.1)
Sodium: 140 mmol/L (ref 135–145)
Total Bilirubin: 0.4 mg/dL (ref 0.3–1.2)
Total Protein: 7 g/dL (ref 6.5–8.1)

## 2017-11-09 LAB — LACTATE DEHYDROGENASE: LDH: 167 U/L (ref 98–192)

## 2017-11-09 MED ORDER — DIPHENHYDRAMINE HCL 50 MG/ML IJ SOLN
50.0000 mg | Freq: Once | INTRAMUSCULAR | Status: AC
  Administered 2017-11-09: 50 mg via INTRAVENOUS
  Filled 2017-11-09: qty 1

## 2017-11-09 MED ORDER — PALONOSETRON HCL INJECTION 0.25 MG/5ML
0.2500 mg | Freq: Once | INTRAVENOUS | Status: AC
  Administered 2017-11-09: 0.25 mg via INTRAVENOUS
  Filled 2017-11-09: qty 5

## 2017-11-09 MED ORDER — SODIUM CHLORIDE 0.9 % IV SOLN
Freq: Once | INTRAVENOUS | Status: AC
  Administered 2017-11-09: 11:00:00 via INTRAVENOUS

## 2017-11-09 MED ORDER — HEPARIN SOD (PORK) LOCK FLUSH 100 UNIT/ML IV SOLN
INTRAVENOUS | Status: AC
  Filled 2017-11-09: qty 5

## 2017-11-09 MED ORDER — CARBOPLATIN CHEMO INJECTION 600 MG/60ML
404.5000 mg | Freq: Once | INTRAVENOUS | Status: AC
  Administered 2017-11-09: 400 mg via INTRAVENOUS
  Filled 2017-11-09: qty 40

## 2017-11-09 MED ORDER — PACLITAXEL CHEMO INJECTION 300 MG/50ML
175.0000 mg/m2 | Freq: Once | INTRAVENOUS | Status: AC
  Administered 2017-11-09: 318 mg via INTRAVENOUS
  Filled 2017-11-09: qty 53

## 2017-11-09 MED ORDER — FAMOTIDINE IN NACL 20-0.9 MG/50ML-% IV SOLN
20.0000 mg | Freq: Once | INTRAVENOUS | Status: AC
  Administered 2017-11-09: 20 mg via INTRAVENOUS
  Filled 2017-11-09: qty 50

## 2017-11-09 MED ORDER — PEGFILGRASTIM 6 MG/0.6ML ~~LOC~~ PSKT
6.0000 mg | PREFILLED_SYRINGE | Freq: Once | SUBCUTANEOUS | Status: AC
  Administered 2017-11-09: 6 mg via SUBCUTANEOUS
  Filled 2017-11-09: qty 0.6

## 2017-11-09 MED ORDER — SODIUM CHLORIDE 0.9 % IV SOLN
20.0000 mg | Freq: Once | INTRAVENOUS | Status: AC
  Administered 2017-11-09: 20 mg via INTRAVENOUS
  Filled 2017-11-09: qty 2

## 2017-11-09 MED ORDER — TRAMADOL HCL 50 MG PO TABS: 50.0000 mg | ORAL_TABLET | Freq: Four times a day (QID) | ORAL | 1 refills | Status: DC | PRN

## 2017-11-09 MED ORDER — HEPARIN SOD (PORK) LOCK FLUSH 100 UNIT/ML IV SOLN
500.0000 [IU] | Freq: Once | INTRAVENOUS | Status: AC | PRN
  Administered 2017-11-09: 500 [IU]
  Filled 2017-11-09: qty 5

## 2017-11-09 MED ORDER — SODIUM CHLORIDE 0.9% FLUSH
10.0000 mL | INTRAVENOUS | Status: DC | PRN
  Administered 2017-11-09: 10 mL
  Filled 2017-11-09: qty 10

## 2017-11-09 NOTE — Progress Notes (Signed)
29 Labs reviewed with and pt seen by Dr. Walden Field and pt approved for chemo tx today per MD                  Latasha [name] tolerated chemo tx with Neulasta on-pro well without complaints or incident. Neulasta on-pro applied to pt's right arm with green indicator light flashing. VSS upon discharge. Pt discharged self ambulatory in satisfactory condition accompanied by her son

## 2017-11-09 NOTE — Patient Instructions (Signed)
[facility] Discharge Instructions for Patients Receiving Chemotherapy   Beginning January 23rd 2017 lab work for the [facility] will be done in the  Main lab at [facility] on 1st floor. If you have a lab appointment with the [place] please come in thru the  Main Entrance and check in at the main information desk   Today you received the following chemotherapy agents Taxol and Carboplatin as well as Neulasta on-pro. Follow-up as scheduled. Call clinic for any questions or concerns  To help prevent nausea and vomiting after your treatment, we encourage you to take your nausea medication   If you develop nausea and vomiting, or diarrhea that is not controlled by your medication, call the clinic.  The clinic phone number is [phone]. Office hours are Monday-Friday 8:30am-5:00pm.  BELOW ARE SYMPTOMS THAT SHOULD BE REPORTED IMMEDIATELY:  *FEVER GREATER THAN 101.0 F  *CHILLS WITH OR WITHOUT FEVER  NAUSEA AND VOMITING THAT IS NOT CONTROLLED WITH YOUR NAUSEA MEDICATION  *UNUSUAL SHORTNESS OF BREATH  *UNUSUAL BRUISING OR BLEEDING  TENDERNESS IN MOUTH AND THROAT WITH OR WITHOUT PRESENCE OF ULCERS  *URINARY PROBLEMS  *BOWEL PROBLEMS  UNUSUAL RASH Items with * indicate a potential emergency and should be followed up as soon as possible. If you have an emergency after office hours please contact your primary care physician or go to the nearest emergency department.  Please call the clinic during office hours if you have any questions or concerns.   You may also contact the Patient Navigator at [phone] should you have any questions or need assistance in obtaining follow up care.      Resources For Cancer Patients and their Caregivers ? American Cancer Society: Can assist with transportation, wigs, general needs, runs Look Good Feel Better.        873-192-7881 ? Cancer Care: Provides financial assistance, online support groups,  medication/co-pay assistance.  1-800-813-HOPE (239)332-8045) ? [place] Assists [place] cancer patients and their families through emotional , educational and financial support.  701 193 2316 ? [place] DSS Where to apply for food stamps, Medicaid and utility assistance. 313-705-1044 ? RCATS: Transportation to medical appointments. 919-531-9381 ? Social Security Administration: May apply for disability if have a Stage IV cancer. [phone])862-9058 ? [employer], Disability and Transit Services: Assists with nutrition, care and transit needs. 570-286-2830

## 2017-11-09 NOTE — Patient Instructions (Signed)
[place] [facility] Discharge Instructions  RECOMMENDATIONS MADE BY THE CONSULTANT AND ANY TEST RESULTS WILL BE SENT TO YOUR REFERRING PHYSICIAN.  You were seen today by Dr. Zoila Shutter You will get treatment today and again in 3 weeks. We will talk with Dr. Denman George to see if she wants to see you again at the end of your 6th treatment.   See schedulers up front for your appointments.  Thank you for choosing [place] at [facility] to provide your oncology and hematology care.  To afford each patient quality time with our provider, please arrive at least 15 minutes before your scheduled appointment time.    If you have a lab appointment with the [place] please come in thru the  Main Entrance and check in at the main information desk  You need to re-schedule your appointment should you arrive 10 or more minutes late.  We strive to give you quality time with our providers, and arriving late affects you and other patients whose appointments are after yours.  Also, if you no show three or more times for appointments you may be dismissed from the clinic at the providers discretion.     Again, thank you for choosing [facility].  Our hope is that these requests will decrease the amount of time that you wait before being seen by our physicians.       _____________________________________________________________  Should you have questions after your visit to [facility], please contact our office at [phone] between the hours of 8:30 a.m. and 4:30 p.m.  Voicemails left after 4:30 p.m. will not be returned until the following business day.  For prescription refill requests, have your pharmacy contact our office.       Resources For Cancer Patients and their Caregivers ? American Cancer Society: Can assist with transportation, wigs, general needs, runs Look Good Feel Better.        330 436 1913 ? Cancer  Care: Provides financial assistance, online support groups, medication/co-pay assistance.  1-800-813-HOPE 450-612-6242) ? [place] Assists [place] cancer patients and their families through emotional , educational and financial support.  670-882-7421 ? [place] DSS Where to apply for food stamps, Medicaid and utility assistance. (306)138-2488 ? RCATS: Transportation to medical appointments. 734-038-8193 ? Social Security Administration: May apply for disability if have a Stage IV cancer. (903)030-8150 (212)390-7756 ? [employer], Disability and Transit Services: Assists with nutrition, care and transit needs. [place] Support Programs: @10RELATIVEDAYS @ > Cancer Support Group  2nd Tuesday of the month 1pm-2pm, Journey Room  > Creative Journey  3rd Tuesday of the month 1130am-1pm, Journey Room  > Look Good Feel Better  1st Wednesday of the month 10am-12 noon, Journey Room (Call [facility] to register [phone])

## 2017-11-10 LAB — CA 125: Cancer Antigen (CA) 125: 7.9 U/mL (ref 0.0–38.1)

## 2017-11-20 ENCOUNTER — Ambulatory Visit (HOSPITAL_COMMUNITY): Payer: Medicare Other

## 2017-11-20 ENCOUNTER — Ambulatory Visit (HOSPITAL_COMMUNITY): Payer: Medicare Other | Admitting: Internal Medicine

## 2017-11-20 ENCOUNTER — Other Ambulatory Visit (HOSPITAL_COMMUNITY): Payer: Medicare Other

## 2017-11-20 NOTE — Progress Notes (Signed)
Diagnosis No diagnosis found.  Staging Cancer Staging Ovarian cancer [facility]) Staging form: Ovary, AJCC 7th Edition - Clinical stage from 04/27/2015: FIGO Stage IVB, calculated as Stage IV (T3c, N1, M1) - Signed by Baird Cancer, PA-C on 09/16/2015 - Pathologic stage from 05/08/2015: FIGO Stage IVB, calculated as Stage IV (TX, N1, M1) - Signed by Everitt Amber, MD on 05/08/2015 - Pathologic stage from 07/28/2015: FIGO Stage IV (T3c, NX, M1) - Signed by Baird Cancer, PA-C on 09/16/2015   Assessment and Plan:   1.  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 CT C/A/P11/12/18 demonstrated progressive disease with increase in size of previous LUQ peritoneal nodule, new left adrenal nodule and new perirectal nodule.  Pt was seen by Dr. Denman George on 08/14/17 and she recommended salvage chemotherapy with platinum and taxane for at least 6 cycles followed by PARP inhibitor maintenance with Niraparib.   CT scan done 10/30/2017 showed IMPRESSION: 1. No evidence of recurrent metastatic disease in the chest. 2. Significant partial response in the abdomen and pelvis. Peritoneal implants have significantly decreased and/or resolved, as detailed. No new or progressive metastatic disease. No ascites.  She will proceed with C5 of Taxol and Carboplatin.  She will RTC in 3 weeks for follow-up.  Labs adequate for chemotherapy.       Ovarian cancer ([place])   04/25/2015 - 04/28/2015 Hospital Admission    Nausea/diarrhea.  Oncology consult completed on 04/27/2015.      04/25/2015 Tumor Marker    CA 125- 3902.      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      04/27/2015 Procedure    US Paracentesis- A total of approximately 3700 mL of amber colored fluid was removed. A fluid sample was sent for laboratory  analysis.      04/27/2015 Imaging    CT Chest- Mild left supraclavicular lymphadenopathy and moderate mediastinal lymphadenopathy involving the prevascular, subcarinal and bilateral pericardiophrenic nodal chains, likely metastatic.      04/27/2015 Pathology Results    Diagnosis PERITONEAL/ASCITIC FLUID(SPECIMEN 1 OF 1 COLLECTED 04/27/15): MALIGNANT CELLS CONSISTENT WITH METASTATIC ADENOCARCINOMA.  The immunophenotype is most consistent with a gynecologic primary, most likely ovary.      05/08/2015 Miscellaneous    Seen by Dr. Denman George- recommending Carboplatin/Paclitaxel x 3 cycles and return visit to see her (within 1 week of administration of third cycle) to evaluate for optimal sequencing of treatment modalities.      05/13/2015 - 05/15/2015 Hospital Admission    Hospitalized for AKI      05/19/2015 - 07/01/2015 Chemotherapy    Carboplatin/Paclitaxel x 3 cycles      07/28/2015 Surgery    Exploratory laparotomy with total abdominal hysterectomy, bilateral salpingo-oophorectomy, omentectomy radical tumor debulking for ovarian cancer; by Dr. Everitt Amber.      07/28/2015 Pathology Results    Uterus +/- tubes/ovaries, neoplastic, cervix - HIGH GRADE SEROUS CARCINOMA INVOLVING LEFT OVARY, LEFT FALLOPIAN TUBE AND RIGHT FALLOPIAN TUBE. - CERVIX, ENDOMETRIUM AND MYOMETRIUM ARE FREE OF TUMOR. 2. Cul-de-sac biop...      08/26/2015 -  Chemotherapy    Carboplatin/Paclitaxel x 3 cycles      12/23/2015 Miscellaneous    Genetic counseling.  Genetic testing was normal, and did not reveal a deleterious mutation in these genes      03/07/2017 Imaging  CT C/A/P IMPRESSION: Chest Impression:  No evidence of metastatic disease in thorax.  Abdomen / Pelvis Impression:  1. Interval increase in size of solitary nodular peritoneal implant in the LEFT upper quadrant . 2. No additional evidence of peritoneal nodularity. 3. No ascites.      07/31/2017 Progression    CT C/A/P: IMPRESSION: 1. Left  upper quadrant nodule continues to progress, now measuring 20 x 26 mm. 2. New nodule identified between the in adrenal gland, concerning for metastatic deposit. 3. Interval development of tiny nodules in the left lower quadrant mesenteric, concerning for metastases. 4. No ascites. 5. Status post total abdominal hysterectomy and omentectomy.        Problem List Patient Active Problem List   Diagnosis Date Noted  . 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 Genetic testing [Z13.79] 12/15/2015  . Ileus, postoperative ([place]) [K91.89, K56.7] 08/01/2015  . Acute renal failure ([place]) [N17.9] 05/13/2015  . Malnutrition of moderate degree ([place]) [E44.0] 04/28/2015  . Renal failure [N19] 04/25/2015  . Nausea without vomiting [R11.0] 04/25/2015  . Ascites, malignant [R18.0] 04/25/2015  . Ovarian cancer ([place]) [C56.9] 04/25/2015  . Omental mass [K66.8] 04/25/2015  . Acute kidney injury ([place]) [N17.9] 04/25/2015  . UTI (lower urinary tract infection) [N39.0] 04/25/2015  . Normocytic anemia [D64.9] 04/25/2015  . 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 Schizophrenia, unspecified type ([place]) [F20.9] 04/25/2015  . Diarrhea [R19.7] 04/25/2015    Past Medical History Past Medical History:  Diagnosis Date  . Arthritis   . B12 deficiency   . Cataract   . History of blood transfusion   . History of chemotherapy   . Hypertension   . Iron deficiency anemia   . Mixed hyperlipidemia   . Ovarian cancer ([place])   . 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 Schizophrenia ([place])   . Ulcerative colitis ([place])   . Vitamin D deficiency     Past Surgical History Past Surgical History:  Procedure Laterality Date  . ABDOMINAL HYSTERECTOMY N/A 07/28/2015   Procedure: TOTAL HYSTERECTOMY ABDOMINAL BILATERAL SALPINGO OOPHRORECTOMY;  Surgeon: Everitt Amber, MD;  Location: [facility];  Service: Gynecology;  Laterality: N/A;  . DEBULKING N/A 07/28/2015   Procedure: DEBULKING;   Surgeon: Everitt Amber, MD;  Location: [facility];  Service: Gynecology;  Laterality: N/A;  . LAPAROTOMY N/A 07/28/2015   Procedure: EXPLORATORY LAPAROTOMY;  Surgeon: Everitt Amber, MD;  Location: [facility];  Service: Gynecology;  Laterality: N/A;  . OMENTECTOMY N/A 07/28/2015   Procedure: OMENTECTOMY ;  Surgeon: Everitt Amber, MD;  Location: [facility];  Service: Gynecology;  Laterality: N/A;  . PARACENTESIS  04/27/15  . PORTACATH PLACEMENT Right 04/2015  . REPLACEMENT TOTAL KNEE     right knee in 2003    Family History Family History  Problem Relation Age of Onset  . Prostate cancer Brother   . Cancer Paternal Uncle        1 uncle with cancer NOS  . Lung cancer Maternal Grandmother   . Hypertension Maternal Grandfather   . Congestive Heart Failure Mother   . Seizures Sister      Social History  reports that  has never smoked. she has never used smokeless tobacco. She reports that she does not drink alcohol or use drugs.  Medications  Current Outpatient Medications:  .  acetaminophen (TYLENOL) 500 MG tablet, Take 2 tablets (1,000 mg total) by mouth every 12 (twelve) hours., Disp: 30 tablet, Rfl: 0 .  amLODipine (NORVASC) 10 MG  tablet, Take 10 mg by mouth every evening. , Disp: , Rfl:  .  aspirin EC 81 MG tablet, Take 81 mg by mouth daily., Disp: , Rfl:  .  CARBOPLATIN IV, Inject into the vein. Every 3 weeks, Disp: , Rfl:  .  Cholecalciferol (VITAMIN D-3) 1000 UNITS CAPS, Take 1,000 Units by mouth daily. , Disp: , Rfl:  .  dexamethasone (DECADRON) 4 MG tablet, Take 2 tablets (8 mg total) by mouth daily. Start the day after chemotherapy for 2 days., Disp: 30 tablet, Rfl: 1 .  docusate sodium (COLACE) 100 MG capsule, Take 100 mg by mouth 2 (two) times daily., Disp: , Rfl:  .  folic acid (FOLVITE) 1 MG tablet, Take 1 mg by mouth daily., Disp: , Rfl:  .  lidocaine-prilocaine (EMLA) cream, Apply to affected area once, Disp: 30 g, Rfl: 3 .  lovastatin (MEVACOR) 10 MG tablet, Take 10 mg by mouth at bedtime.,  Disp: , Rfl:  .  metoprolol tartrate (LOPRESSOR) 25 MG tablet, Take 1 tablet (25 mg total) by mouth 2 (two) times daily., Disp: 60 tablet, Rfl: 6 .  mirtazapine (REMERON) 15 MG tablet, Take 7.5 mg by mouth at bedtime., Disp: , Rfl:  .  Multiple Vitamin (MULTIVITAMIN WITH MINERALS) TABS tablet, Take 1 tablet by mouth daily., Disp: , Rfl:  .  ondansetron (ZOFRAN) 8 MG tablet, Take 1 tablet (8 mg total) by mouth 2 (two) times daily as needed for refractory nausea / vomiting. Start on day 3 after chemo., Disp: 30 tablet, Rfl: 1 .  PACLitaxel (TAXOL) 300 MG/50ML injection, Inject into the vein. Every 3 weeks, Disp: , Rfl:  .  prochlorperazine (COMPAZINE) 10 MG tablet, Take 1 tablet (10 mg total) by mouth every 6 (six) hours as needed (Nausea or vomiting)., Disp: 30 tablet, Rfl: 1 .  QUEtiapine (SEROQUEL XR) 300 MG 24 hr tablet, Take 300 mg by mouth at bedtime., Disp: , Rfl:  .  sulfaSALAzine (AZULFIDINE) 500 MG tablet, Take 500 mg by mouth 3 (three) times daily., Disp: , Rfl:  .  traMADol (ULTRAM) 50 MG tablet, Take 1 tablet (50 mg total) by mouth every 6 (six) hours as needed., Disp: 90 tablet, Rfl: 1 .  vitamin B-12 (CYANOCOBALAMIN) 1000 MCG tablet, Take 1,000 mcg by mouth daily., Disp: , Rfl:   Allergies Patient has no known allergies.  Review of Systems Review of Systems - Oncology ROS as per HPI otherwise 12 point ROS is negative.   Physical Exam  Vitals Wt Readings from Last 3 Encounters:  11/09/17 161 lb 6.4 oz (73.2 kg)  11/09/17 161 lb 6.4 oz (73.2 kg)  10/19/17 161 lb 3.2 oz (73.1 kg)   Temp Readings from Last 3 Encounters:  11/09/17 97.6 F (36.4 C) (Oral)  11/09/17 98 F (36.7 C) (Oral)  10/19/17 (!) 97.2 F (36.2 C) (Oral)   BP Readings from Last 3 Encounters:  11/09/17 (!) 150/69  11/09/17 128/63  10/19/17 (!) 149/70   Pulse Readings from Last 3 Encounters:  11/09/17 79  11/09/17 70  10/19/17 85   Constitutional: Well-developed, well-nourished, and in no  distress.   HENT: Head: Normocephalic and atraumatic.  Mouth/Throat: No oropharyngeal exudate. Mucosa moist. Eyes: Pupils are equal, round, and reactive to light. Conjunctivae are normal. No scleral icterus.  Neck: Normal range of motion. Neck supple. No JVD present.  Cardiovascular: Normal rate, regular rhythm and normal heart sounds.  Exam reveals no gallop and no friction rub.   No murmur heard. Pulmonary/Chest:  Effort normal and breath sounds normal. No respiratory distress. No wheezes.No rales.  Abdominal: Soft. Bowel sounds are normal. No distension. There is no tenderness. There is no guarding.  Musculoskeletal: No edema or tenderness.  Lymphadenopathy: No cervical, axillary or supraclavicular adenopathy.  Neurological: Alert and oriented to person, place, and time. No cranial nerve deficit.  Skin: Skin is warm and dry. No rash noted. No erythema. No pallor.  Psychiatric: Affect and judgment normal.   Labs Infusion on 11/09/2017  Component Date Value Ref Range Status  . WBC 11/09/2017 4.7  4.0 - 10.5 K/uL Final  . RBC 11/09/2017 2.68* 3.87 - 5.11 MIL/uL Final  . Hemoglobin 11/09/2017 9.8* 12.0 - 15.0 g/dL Final  . HCT 11/09/2017 30.2* 36.0 - 46.0 % Final  . MCV 11/09/2017 112.7* 78.0 - 100.0 fL Final  . MCH 11/09/2017 36.6* 26.0 - 34.0 pg Final  . MCHC 11/09/2017 32.5  30.0 - 36.0 g/dL Final  . RDW 11/09/2017 11.8  11.5 - 15.5 % Final  . Platelets 11/09/2017 368  150 - 400 K/uL Final  . Neutrophils Relative % 11/09/2017 52  % Final  . Lymphocytes Relative 11/09/2017 25  % Final  . Monocytes Relative 11/09/2017 19  % Final  . Eosinophils Relative 11/09/2017 3  % Final  . Basophils Relative 11/09/2017 1  % Final  . Neutro Abs 11/09/2017 2.5  1.7 - 7.7 K/uL Final  . Lymphs Abs 11/09/2017 1.2  0.7 - 4.0 K/uL Final  . Monocytes Absolute 11/09/2017 0.9  0.1 - 1.0 K/uL Final  . Eosinophils Absolute 11/09/2017 0.1  0.0 - 0.7 K/uL Final  . Basophils Absolute 11/09/2017 0.0  0.0 -  0.1 K/uL Final  . RBC Morphology 11/09/2017 MAVROCYTES   Final   Performed at [facility], [address], [place], [place] [address]  Appointment on 11/09/2017  Component Date Value Ref Range Status  . Sodium 11/09/2017 140  135 - 145 mmol/L Final  . Potassium 11/09/2017 4.6  3.5 - 5.1 mmol/L Final  . Chloride 11/09/2017 104  101 - 111 mmol/L Final  . CO2 11/09/2017 27  22 - 32 mmol/L Final  . Glucose, Bld 11/09/2017 92  65 - 99 mg/dL Final  . BUN 11/09/2017 10  6 - 20 mg/dL Final  . Creatinine, Ser 11/09/2017 0.95  0.44 - 1.00 mg/dL Final  . Calcium 11/09/2017 9.3  8.9 - 10.3 mg/dL Final  . Total Protein 11/09/2017 7.0  6.5 - 8.1 g/dL Final  . Albumin 11/09/2017 4.2  3.5 - 5.0 g/dL Final  . AST 11/09/2017 24  15 - 41 U/L Final  . ALT 11/09/2017 19  14 - 54 U/L Final  . Alkaline Phosphatase 11/09/2017 80  38 - 126 U/L Final  . Total Bilirubin 11/09/2017 0.4  0.3 - 1.2 mg/dL Final  . GFR calc non Af Amer 11/09/2017 58* >60 mL/min Final  . GFR calc Af Amer 11/09/2017 >60  >60 mL/min Final   Comment: (NOTE) The eGFR has been calculated using the CKD EPI equation. This calculation has not been validated in all clinical situations. eGFR's persistently <60 mL/min signify possible Chronic Kidney Disease.   Georgiann Hahn gap 11/09/2017 9  5 - 15 Final   Performed at [facility], [address], [place], [place] [address]  . Cancer Antigen (CA) 125 11/09/2017 7.9  0.0 - 38.1 U/mL Final   Comment: (NOTE) Roche [employer]ostics Electrochemiluminescence Immunoassay (ECLIA) Values obtained with different assay methods or kits cannot be used interchangeably.  Results cannot be interpreted as absolute evidence of the presence or absence of malignant disease. Performed At: [facility] [place], [place] [id number] Rush Farmer MD ID:[id number] Performed at [facility], [address], [place], [place] [address]   . LDH 11/09/2017 167  98 - 192 U/L Final   Performed at [facility], [address] [place], [place] [address]     CT scan done 10/30/2017 showed IMPRESSION: 1. No evidence of recurrent metastatic disease in the chest. 2. Significant partial response in the abdomen and pelvis. Peritoneal implants have significantly decreased and/or resolved, as detailed. No new or progressive metastatic disease. No ascites.       Zoila Shutter MD

## 2017-11-30 ENCOUNTER — Encounter (HOSPITAL_COMMUNITY): Payer: Self-pay | Admitting: Internal Medicine

## 2017-11-30 ENCOUNTER — Encounter (HOSPITAL_COMMUNITY): Payer: Self-pay

## 2017-11-30 ENCOUNTER — Inpatient Hospital Stay (HOSPITAL_BASED_OUTPATIENT_CLINIC_OR_DEPARTMENT_OTHER): Payer: Medicare Other | Admitting: Internal Medicine

## 2017-11-30 ENCOUNTER — Inpatient Hospital Stay (HOSPITAL_COMMUNITY): Payer: Medicare Other | Attending: Internal Medicine

## 2017-11-30 ENCOUNTER — Inpatient Hospital Stay (HOSPITAL_COMMUNITY): Payer: Medicare Other

## 2017-11-30 VITALS — BP 146/68 | HR 73 | Temp 97.8°F | Resp 18 | Wt 161.8 lb

## 2017-11-30 VITALS — BP 151/70 | HR 81 | Temp 97.8°F | Resp 18

## 2017-11-30 DIAGNOSIS — E782 Mixed hyperlipidemia: Secondary | ICD-10-CM | POA: Diagnosis not present

## 2017-11-30 DIAGNOSIS — F209 Schizophrenia, unspecified: Secondary | ICD-10-CM | POA: Diagnosis not present

## 2017-11-30 DIAGNOSIS — Z90710 Acquired absence of both cervix and uterus: Secondary | ICD-10-CM

## 2017-11-30 DIAGNOSIS — C786 Secondary malignant neoplasm of retroperitoneum and peritoneum: Secondary | ICD-10-CM

## 2017-11-30 DIAGNOSIS — Z79899 Other long term (current) drug therapy: Secondary | ICD-10-CM

## 2017-11-30 DIAGNOSIS — I10 Essential (primary) hypertension: Secondary | ICD-10-CM | POA: Diagnosis not present

## 2017-11-30 DIAGNOSIS — C569 Malignant neoplasm of unspecified ovary: Secondary | ICD-10-CM | POA: Diagnosis not present

## 2017-11-30 DIAGNOSIS — Z7982 Long term (current) use of aspirin: Secondary | ICD-10-CM | POA: Diagnosis not present

## 2017-11-30 DIAGNOSIS — Z5111 Encounter for antineoplastic chemotherapy: Secondary | ICD-10-CM | POA: Insufficient documentation

## 2017-11-30 DIAGNOSIS — Z7689 Persons encountering health services in other specified circumstances: Secondary | ICD-10-CM | POA: Diagnosis not present

## 2017-11-30 DIAGNOSIS — E559 Vitamin D deficiency, unspecified: Secondary | ICD-10-CM | POA: Diagnosis not present

## 2017-11-30 DIAGNOSIS — R180 Malignant ascites: Secondary | ICD-10-CM

## 2017-11-30 DIAGNOSIS — C7800 Secondary malignant neoplasm of unspecified lung: Secondary | ICD-10-CM | POA: Insufficient documentation

## 2017-11-30 DIAGNOSIS — K5090 Crohn's disease, unspecified, without complications: Secondary | ICD-10-CM | POA: Diagnosis not present

## 2017-11-30 DIAGNOSIS — K589 Irritable bowel syndrome without diarrhea: Secondary | ICD-10-CM | POA: Insufficient documentation

## 2017-11-30 DIAGNOSIS — E538 Deficiency of other specified B group vitamins: Secondary | ICD-10-CM | POA: Insufficient documentation

## 2017-11-30 LAB — CBC WITH DIFFERENTIAL/PLATELET
BASOS PCT: 1 %
Basophils Absolute: 0 10*3/uL (ref 0.0–0.1)
EOS PCT: 2 %
Eosinophils Absolute: 0.1 10*3/uL (ref 0.0–0.7)
HCT: 30.8 % — ABNORMAL LOW (ref 36.0–46.0)
HEMOGLOBIN: 9.9 g/dL — AB (ref 12.0–15.0)
LYMPHS PCT: 16 %
Lymphs Abs: 0.8 10*3/uL (ref 0.7–4.0)
MCH: 35.6 pg — AB (ref 26.0–34.0)
MCHC: 32.1 g/dL (ref 30.0–36.0)
MCV: 110.8 fL — AB (ref 78.0–100.0)
Monocytes Absolute: 0.6 10*3/uL (ref 0.1–1.0)
Monocytes Relative: 13 %
NEUTROS PCT: 68 %
Neutro Abs: 3.2 10*3/uL (ref 1.7–7.7)
Platelets: 316 10*3/uL (ref 150–400)
RBC: 2.78 MIL/uL — ABNORMAL LOW (ref 3.87–5.11)
RDW: 12.9 % (ref 11.5–15.5)
WBC: 4.7 10*3/uL (ref 4.0–10.5)

## 2017-11-30 LAB — COMPREHENSIVE METABOLIC PANEL
ALK PHOS: 85 U/L (ref 38–126)
ALT: 20 U/L (ref 14–54)
AST: 25 U/L (ref 15–41)
Albumin: 4.1 g/dL (ref 3.5–5.0)
Anion gap: 9 (ref 5–15)
BUN: 11 mg/dL (ref 6–20)
CALCIUM: 9.3 mg/dL (ref 8.9–10.3)
CO2: 27 mmol/L (ref 22–32)
CREATININE: 0.9 mg/dL (ref 0.44–1.00)
Chloride: 104 mmol/L (ref 101–111)
Glucose, Bld: 94 mg/dL (ref 65–99)
Potassium: 4.3 mmol/L (ref 3.5–5.1)
Sodium: 140 mmol/L (ref 135–145)
Total Bilirubin: 0.6 mg/dL (ref 0.3–1.2)
Total Protein: 6.8 g/dL (ref 6.5–8.1)

## 2017-11-30 LAB — LACTATE DEHYDROGENASE: LDH: 157 U/L (ref 98–192)

## 2017-11-30 MED ORDER — SODIUM CHLORIDE 0.9% FLUSH
10.0000 mL | INTRAVENOUS | Status: DC | PRN
  Administered 2017-11-30: 10 mL
  Filled 2017-11-30: qty 10

## 2017-11-30 MED ORDER — PACLITAXEL CHEMO INJECTION 300 MG/50ML
175.0000 mg/m2 | Freq: Once | INTRAVENOUS | Status: AC
  Administered 2017-11-30: 318 mg via INTRAVENOUS
  Filled 2017-11-30: qty 53

## 2017-11-30 MED ORDER — SODIUM CHLORIDE 0.9 % IV SOLN
Freq: Once | INTRAVENOUS | Status: AC
  Administered 2017-11-30: 10:00:00 via INTRAVENOUS

## 2017-11-30 MED ORDER — HEPARIN SOD (PORK) LOCK FLUSH 100 UNIT/ML IV SOLN
500.0000 [IU] | Freq: Once | INTRAVENOUS | Status: AC | PRN
  Administered 2017-11-30: 500 [IU]
  Filled 2017-11-30: qty 5

## 2017-11-30 MED ORDER — HEPARIN SOD (PORK) LOCK FLUSH 100 UNIT/ML IV SOLN
INTRAVENOUS | Status: AC
  Filled 2017-11-30: qty 5

## 2017-11-30 MED ORDER — DEXAMETHASONE SODIUM PHOSPHATE 100 MG/10ML IJ SOLN
20.0000 mg | Freq: Once | INTRAMUSCULAR | Status: AC
  Administered 2017-11-30: 20 mg via INTRAVENOUS
  Filled 2017-11-30: qty 2

## 2017-11-30 MED ORDER — SODIUM CHLORIDE 0.9 % IV SOLN
404.5000 mg | Freq: Once | INTRAVENOUS | Status: AC
  Administered 2017-11-30: 400 mg via INTRAVENOUS
  Filled 2017-11-30: qty 40

## 2017-11-30 MED ORDER — PALONOSETRON HCL INJECTION 0.25 MG/5ML
0.2500 mg | Freq: Once | INTRAVENOUS | Status: AC
  Administered 2017-11-30: 0.25 mg via INTRAVENOUS
  Filled 2017-11-30: qty 5

## 2017-11-30 MED ORDER — PEGFILGRASTIM 6 MG/0.6ML ~~LOC~~ PSKT
6.0000 mg | PREFILLED_SYRINGE | Freq: Once | SUBCUTANEOUS | Status: AC
  Administered 2017-11-30: 6 mg via SUBCUTANEOUS
  Filled 2017-11-30: qty 0.6

## 2017-11-30 MED ORDER — FAMOTIDINE IN NACL 20-0.9 MG/50ML-% IV SOLN
20.0000 mg | Freq: Once | INTRAVENOUS | Status: AC
  Administered 2017-11-30: 20 mg via INTRAVENOUS
  Filled 2017-11-30: qty 50

## 2017-11-30 MED ORDER — DIPHENHYDRAMINE HCL 50 MG/ML IJ SOLN
50.0000 mg | Freq: Once | INTRAMUSCULAR | Status: AC
  Administered 2017-11-30: 50 mg via INTRAVENOUS
  Filled 2017-11-30: qty 1

## 2017-11-30 NOTE — Progress Notes (Signed)
0925 Labs reviewed with and pt seen by Dr. Walden Field  and pt approved for chemo tx today per MD                  Latasha [name] tolerated chemo tx with Neulasta on-pro well without complaints or incident. Neulasta on-pro applied to pt's right arm with green indicator light flashing. VSS upon discharge. Pt discharged self ambulatory in satisfactory condition accompanied by her son

## 2017-11-30 NOTE — Progress Notes (Signed)
Diagnosis Malignant neoplasm of ovary, unspecified laterality ([place]) - Plan: CT Abdomen Pelvis W Contrast, CT Chest W Contrast, DISCONTINUED: pegfilgrastim (NEULASTA ONPRO KIT) injection 6 mg, DISCONTINUED: sodium chloride flush (NS) 0.9 % injection 10 mL, DISCONTINUED: heparin lock flush 100 unit/mL, DISCONTINUED: 0.9 %  sodium chloride infusion, DISCONTINUED: diphenhydrAMINE (BENADRYL) injection 50 mg, DISCONTINUED: famotidine (PEPCID) IVPB 20 mg premix, DISCONTINUED: palonosetron (ALOXI) injection 0.25 mg, DISCONTINUED: dexamethasone (DECADRON) 20 mg in sodium chloride 0.9 % 50 mL IVPB, DISCONTINUED: CARBOplatin (PARAPLATIN) 400 mg in sodium chloride 0.9 % 250 mL chemo infusion, DISCONTINUED: PACLitaxel (TAXOL) 318 mg in dextrose 5 % 500 mL chemo infusion (> 87m/m2)  Staging Cancer Staging Ovarian cancer (HCC) Staging form: Ovary, AJCC 7th Edition - Clinical stage from 04/27/2015: FIGO Stage IVB, calculated as Stage IV (T3c, N1, M1) - Signed by KBaird Cancer PA-C on 09/16/2015 - Pathologic stage from 05/08/2015: FIGO Stage IVB, calculated as Stage IV (TX, N1, M1) - Signed by REveritt Amber MD on 05/08/2015 - Pathologic stage from 07/28/2015: FIGO Stage IV (T3c, NX, M1) - Signed by KBaird Cancer PA-C on 09/16/2015   Assessment and Plan:  1.  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 CT C/A/P11/12/18 demonstrated progressive disease with increase in size of previous LUQ peritoneal nodule, new left adrenal nodule and new perirectal nodule.  Pt was seen by Dr. RDenman Georgeon 08/14/17 and she recommended salvage chemotherapy with platinum and taxane for at least 6 cycles followed by PARP inhibitor maintenance with Niraparib.   CT scan done 10/30/2017 showed IMPRESSION: 1. No evidence of recurrent metastatic disease in the chest. 2. Significant partial response in the abdomen and pelvis. Peritoneal implants have significantly decreased and/or resolved,  as detailed. No new or progressive metastatic disease. No ascites.  Patient will proceed with cycle 6 of Taxol and carboplatin.  She will be set up for CT chest, abdomen and pelvis for interval follow-up.  Labs are adequate for chemotherapy.  She has a creatinine of 0.9.  She will return to clinic in 3 weeks to go over the results.  2.  Hypertension.  Blood pressure is 146/68.  She should continue to follow with her primary care physician.  3.  B12 deficiency.  Hemoglobin is 10.  Patient should continue B12 as previously directed.  Current Status: Patient seen today for follow-up.  She is here for evaluation prior to cycle 6 of Taxol carboplatin.  She is tolerating therapy without problems.  She denies any increased bloating or abdominal pain.    [place] ([place]   04/25/2015 - 04/28/2015 Hospital Admission    Nausea/diarrhea.  Oncology consult completed on 04/27/2015.      04/25/2015 Tumor Marker    CA 125- 3902.      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      04/27/2015 Procedure    UKoreaParacentesis- A total of approximately 3700 mL of amber colored fluid was removed. A fluid sample was sent for laboratory analysis.      04/27/2015 Imaging    CT Chest- Mild left supraclavicular lymphadenopathy and moderate mediastinal lymphadenopathy involving the prevascular, subcarinal and bilateral pericardiophrenic nodal chains, likely metastatic.      04/27/2015 Pathology Results    Diagnosis PERITONEAL/ASCITIC FLUID(SPECIMEN 1 OF 1 COLLECTED 04/27/15): MALIGNANT CELLS CONSISTENT WITH METASTATIC ADENOCARCINOMA.  The  immunophenotype is most consistent with a gynecologic primary, most likely ovary.      05/08/2015 Miscellaneous    Seen by Dr. Denman George- recommending Carboplatin/Paclitaxel x 3 cycles and return visit to see her (within 1 week of  administration of third cycle) to evaluate for optimal sequencing of treatment modalities.      05/13/2015 - 05/15/2015 Hospital Admission    Hospitalized for AKI      05/19/2015 - 07/01/2015 Chemotherapy    Carboplatin/Paclitaxel x 3 cycles      07/28/2015 Surgery    Exploratory laparotomy with total abdominal hysterectomy, bilateral salpingo-oophorectomy, omentectomy radical tumor debulking for ovarian cancer; by Dr. Everitt Amber.      07/28/2015 Pathology Results    Uterus +/- tubes/ovaries, neoplastic, cervix - HIGH GRADE SEROUS CARCINOMA INVOLVING LEFT OVARY, LEFT FALLOPIAN TUBE AND RIGHT FALLOPIAN TUBE. - CERVIX, ENDOMETRIUM AND MYOMETRIUM ARE FREE OF TUMOR. 2. Cul-de-sac biop...      08/26/2015 -  Chemotherapy    Carboplatin/Paclitaxel x 3 cycles      12/23/2015 Miscellaneous    Genetic counseling.  Genetic testing was normal, and did not reveal a deleterious mutation in these genes      03/07/2017 Imaging    CT C/A/P IMPRESSION: Chest Impression:  No evidence of metastatic disease in thorax.  Abdomen / Pelvis Impression:  1. Interval increase in size of solitary nodular peritoneal implant in the LEFT upper quadrant . 2. No additional evidence of peritoneal nodularity. 3. No ascites.      07/31/2017 Progression    CT C/A/P: IMPRESSION: 1. Left upper quadrant nodule continues to progress, now measuring 20 x 26 mm. 2. New nodule identified between the in adrenal gland, concerning for metastatic deposit. 3. Interval development of tiny nodules in the left lower quadrant mesenteric, concerning for metastases. 4. No ascites. 5. Status post total abdominal hysterectomy and omentectomy.      Problem List Patient Active Problem List   Diagnosis Date Noted  . 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 Genetic testing [Z13.79] 12/15/2015  . Ileus, postoperative ([place]) [K91.89, K56.7] 08/01/2015  . Acute renal failure ([place]) [N17.9]  05/13/2015  . Malnutrition of moderate degree ([place]) [E44.0] 04/28/2015  . Renal failure [N19] 04/25/2015  . Nausea without vomiting [R11.0] 04/25/2015  . Ascites, malignant [R18.0] 04/25/2015  . Ovarian cancer ([place]) [C56.9] 04/25/2015  . Omental mass [K66.8] 04/25/2015  . Acute kidney injury ([place]) [N17.9] 04/25/2015  . UTI (lower urinary tract infection) [N39.0] 04/25/2015  . Normocytic anemia [D64.9] 04/25/2015  . 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 Schizophrenia, unspecified type ([place]) [F20.9] 04/25/2015  . Diarrhea [R19.7] 04/25/2015    Past Medical History Past Medical History:  Diagnosis Date  . Arthritis   . B12 deficiency   . Cataract   . History of blood transfusion   . History of chemotherapy   . Hypertension   . Iron deficiency anemia   . Mixed hyperlipidemia   . Ovarian cancer ([place])   . 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 Schizophrenia ([place])   . Ulcerative colitis ([place])   . Vitamin D deficiency     Past Surgical History Past Surgical History:  Procedure Laterality Date  . ABDOMINAL HYSTERECTOMY N/A 07/28/2015   Procedure: TOTAL HYSTERECTOMY ABDOMINAL BILATERAL SALPINGO OOPHRORECTOMY;  Surgeon: Everitt Amber, MD;  Location: [facility];  Service: Gynecology;  Laterality: N/A;  . DEBULKING N/A 07/28/2015   Procedure: DEBULKING;  Surgeon: Everitt Amber, MD;  Location: [facility];  Service: Gynecology;  Laterality: N/A;  . LAPAROTOMY N/A 07/28/2015   Procedure: EXPLORATORY LAPAROTOMY;  Surgeon: Everitt Amber, MD;  Location: [facility];  Service: Gynecology;  Laterality: N/A;  . OMENTECTOMY N/A 07/28/2015   Procedure: OMENTECTOMY ;  Surgeon: Everitt Amber, MD;  Location: [facility];  Service: Gynecology;  Laterality: N/A;  . PARACENTESIS  04/27/15  . PORTACATH PLACEMENT Right 04/2015  . REPLACEMENT TOTAL KNEE     right knee in 2003    Family History Family History  Problem Relation Age of Onset  . Prostate cancer Brother   . Cancer Paternal Uncle        1 uncle with cancer NOS   . Lung cancer Maternal Grandmother   . Hypertension Maternal Grandfather   . Congestive Heart Failure Mother   . Seizures Sister      Social History  reports that  has never smoked. she has never used smokeless tobacco. She reports that she does not drink alcohol or use drugs.  Medications  Current Outpatient Medications:  .  acetaminophen (TYLENOL) 500 MG tablet, Take 2 tablets (1,000 mg total) by mouth every 12 (twelve) hours., Disp: 30 tablet, Rfl: 0 .  amLODipine (NORVASC) 10 MG tablet, Take 10 mg by mouth every evening. , Disp: , Rfl:  .  aspirin EC 81 MG tablet, Take 81 mg by mouth daily., Disp: , Rfl:  .  CARBOPLATIN IV, Inject into the vein. Every 3 weeks, Disp: , Rfl:  .  Cholecalciferol (VITAMIN D-3) 1000 UNITS CAPS, Take 1,000 Units by mouth daily. , Disp: , Rfl:  .  dexamethasone (DECADRON) 4 MG tablet, Take 2 tablets (8 mg total) by mouth daily. Start the day after chemotherapy for 2 days., Disp: 30 tablet, Rfl: 1 .  docusate sodium (COLACE) 100 MG capsule, Take 100 mg by mouth 2 (two) times daily., Disp: , Rfl:  .  folic acid (FOLVITE) 1 MG tablet, Take 1 mg by mouth daily., Disp: , Rfl:  .  lidocaine-prilocaine (EMLA) cream, Apply to affected area once, Disp: 30 g, Rfl: 3 .  lovastatin (MEVACOR) 10 MG tablet, Take 10 mg by mouth at bedtime., Disp: , Rfl:  .  metoprolol tartrate (LOPRESSOR) 25 MG tablet, Take 1 tablet (25 mg total) by mouth 2 (two) times daily., Disp: 60 tablet, Rfl: 6 .  mirtazapine (REMERON) 15 MG tablet, Take 7.5 mg by mouth at bedtime., Disp: , Rfl:  .  Multiple Vitamin (MULTIVITAMIN WITH MINERALS) TABS tablet, Take 1 tablet by mouth daily., Disp: , Rfl:  .  ondansetron (ZOFRAN) 8 MG tablet, Take 1 tablet (8 mg total) by mouth 2 (two) times daily as needed for refractory nausea / vomiting. Start on day 3 after chemo., Disp: 30 tablet, Rfl: 1 .  PACLitaxel (TAXOL) 300 MG/50ML injection, Inject into the vein. Every 3 weeks, Disp: , Rfl:  .   prochlorperazine (COMPAZINE) 10 MG tablet, Take 1 tablet (10 mg total) by mouth every 6 (six) hours as needed (Nausea or vomiting)., Disp: 30 tablet, Rfl: 1 .  QUEtiapine (SEROQUEL XR) 300 MG 24 hr tablet, Take 300 mg by mouth at bedtime., Disp: , Rfl:  .  sulfaSALAzine (AZULFIDINE) 500 MG tablet, Take 500 mg by mouth 3 (three) times daily., Disp: , Rfl:  .  traMADol (ULTRAM) 50 MG tablet, Take 1 tablet (50 mg total) by mouth every 6 (six) hours as needed., Disp: 90 tablet, Rfl: 1 .  vitamin B-12 (CYANOCOBALAMIN) 1000 MCG tablet, Take 1,000 mcg by mouth  daily., Disp: , Rfl:  No current facility-administered medications for this visit.   Facility-Administered Medications Ordered in Other Visits:  .  CARBOplatin (PARAPLATIN) 400 mg in sodium chloride 0.9 % 250 mL chemo infusion, 400 mg, Intravenous, Once, Celvin Taney, MD .  heparin lock flush 100 unit/mL, 500 Units, Intracatheter, Once PRN, Ad Guttman, Mathis Dad, MD .  PACLitaxel (TAXOL) 318 mg in dextrose 5 % 500 mL chemo infusion (> 74m/m2), 175 mg/m2 (Treatment Plan Recorded), Intravenous, Once, Marshawn Normoyle, MD, Last Rate: 184 mL/hr at 11/30/17 1115, 318 mg at 11/30/17 1115 .  pegfilgrastim (NEULASTA ONPRO KIT) injection 6 mg, 6 mg, Subcutaneous, Once, Korbin Mapps, MD .  sodium chloride flush (NS) 0.9 % injection 10 mL, 10 mL, Intracatheter, PRN, Hillard Goodwine, MD, 10 mL at 11/30/17 0930  Allergies Patient has no known allergies.  Review of Systems Review of Systems - Oncology ROS as per HPI otherwise 12 point ROS is negative.   Physical Exam  Vitals Wt Readings from Last 3 Encounters:  11/30/17 161 lb 12.8 oz (73.4 kg)  11/09/17 161 lb 6.4 oz (73.2 kg)  11/09/17 161 lb 6.4 oz (73.2 kg)   Temp Readings from Last 3 Encounters:  11/30/17 97.8 F (36.6 C) (Oral)  11/09/17 97.6 F (36.4 C) (Oral)  11/09/17 98 F (36.7 C) (Oral)   BP Readings from Last 3 Encounters:  11/30/17 (!) 146/68  11/09/17 (!) 150/69  11/09/17 128/63   Pulse  Readings from Last 3 Encounters:  11/30/17 73  11/09/17 79  11/09/17 70   Constitutional: Well-developed, well-nourished, and in no distress.   HENT: Head: Normocephalic and atraumatic.  Mouth/Throat: No oropharyngeal exudate. Mucosa moist. Eyes: Pupils are equal, round, and reactive to light. Conjunctivae are normal. No scleral icterus.  Neck: Normal range of motion. Neck supple. No JVD present.  Cardiovascular: Normal rate, regular rhythm and normal heart sounds.  Exam reveals no gallop and no friction rub.   No murmur heard. Pulmonary/Chest: Effort normal and breath sounds normal. No respiratory distress. No wheezes.No rales.  Abdominal: Soft. Bowel sounds are normal. No distension. There is no tenderness. There is no guarding.  Musculoskeletal: No edema or tenderness.  Lymphadenopathy: No cervical, axillary or supraclavicular adenopathy.  Neurological: Alert and oriented to person, place, and time. No cranial nerve deficit.  Skin: Skin is warm and dry. No rash noted. No erythema. No pallor.  Psychiatric: Affect and judgment normal.   Labs Appointment on 11/30/2017  Component Date Value Ref Range Status  . WBC 11/30/2017 4.7  4.0 - 10.5 K/uL Final  . RBC 11/30/2017 2.78* 3.87 - 5.11 MIL/uL Final  . Hemoglobin 11/30/2017 9.9* 12.0 - 15.0 g/dL Final  . HCT 11/30/2017 30.8* 36.0 - 46.0 % Final  . MCV 11/30/2017 110.8* 78.0 - 100.0 fL Final  . MCH 11/30/2017 35.6* 26.0 - 34.0 pg Final  . MCHC 11/30/2017 32.1  30.0 - 36.0 g/dL Final  . RDW 11/30/2017 12.9  11.5 - 15.5 % Final  . Platelets 11/30/2017 316  150 - 400 K/uL Final  . Neutrophils Relative % 11/30/2017 68  % Final  . Lymphocytes Relative 11/30/2017 16  % Final  . Monocytes Relative 11/30/2017 13  % Final  . Eosinophils Relative 11/30/2017 2  % Final  . Basophils Relative 11/30/2017 1  % Final  . Neutro Abs 11/30/2017 3.2  1.7 - 7.7 K/uL Final  . Lymphs Abs 11/30/2017 0.8  0.7 - 4.0 K/uL Final  . Monocytes Absolute  11/30/2017 0.6  0.1 -  1.0 K/uL Final  . Eosinophils Absolute 11/30/2017 0.1  0.0 - 0.7 K/uL Final  . Basophils Absolute 11/30/2017 0.0  0.0 - 0.1 K/uL Final  . RBC Morphology 11/30/2017 MACROCYTES   Final   Performed at [facility], [address] [place], [place] [address]  . Sodium 11/30/2017 140  135 - 145 mmol/L Final  . Potassium 11/30/2017 4.3  3.5 - 5.1 mmol/L Final  . Chloride 11/30/2017 104  101 - 111 mmol/L Final  . CO2 11/30/2017 27  22 - 32 mmol/L Final  . Glucose, Bld 11/30/2017 94  65 - 99 mg/dL Final  . BUN 11/30/2017 11  6 - 20 mg/dL Final  . Creatinine, Ser 11/30/2017 0.90  0.44 - 1.00 mg/dL Final  . Calcium 11/30/2017 9.3  8.9 - 10.3 mg/dL Final  . Total Protein 11/30/2017 6.8  6.5 - 8.1 g/dL Final  . Albumin 11/30/2017 4.1  3.5 - 5.0 g/dL Final  . AST 11/30/2017 25  15 - 41 U/L Final  . ALT 11/30/2017 20  14 - 54 U/L Final  . Alkaline Phosphatase 11/30/2017 85  38 - 126 U/L Final  . Total Bilirubin 11/30/2017 0.6  0.3 - 1.2 mg/dL Final  . GFR calc non Af Amer 11/30/2017 >60  >60 mL/min Final  . GFR calc Af Amer 11/30/2017 >60  >60 mL/min Final   Comment: (NOTE) The eGFR has been calculated using the CKD EPI equation. This calculation has not been validated in all clinical situations. eGFR's persistently <60 mL/min signify possible Chronic Kidney Disease.   Georgiann Hahn gap 11/30/2017 9  5 - 15 Final   Performed at [facility], [address], [place], [place] [address]  . LDH 11/30/2017 157  98 - 192 U/L Final   Performed at [facility], [address], [place],  [address]     Pathology Orders Placed This Encounter  Procedures  . CT Abdomen Pelvis W Contrast    Standing Status:   Future    Standing Expiration Date:   11/30/2018    Order Specific Question:   If indicated for the ordered procedure, I authorize the administration of contrast media per Radiology protocol    Answer:   Yes    Order Specific Question:   Preferred imaging location?    Answer:    [facility]    Order Specific Question:   Radiology Contrast Protocol - do NOT remove file path    Answer:   [email or web address]  . CT Chest W Contrast    Standing Status:   Future    Standing Expiration Date:   11/30/2018    Order Specific Question:   If indicated for the ordered procedure, I authorize the administration of contrast media per Radiology protocol    Answer:   Yes    Order Specific Question:   Preferred imaging location?    Answer:   [facility]    Order Specific Question:   Radiology Contrast Protocol - do NOT remove file path    Answer:   [email or web address]       Zoila Shutter MD

## 2017-11-30 NOTE — Patient Instructions (Signed)
[place] at [facility] Discharge Instructions   You were seen today by Dr. Zoila Shutter. She discussed your labs and everything looked pretty good. She went over your treatments and getting scans in April. You will get your treatment today. We will schedule a repeat scan in April. We will see you for follow up after you have had your scan.    Thank you for choosing [place] at [facility] to provide your oncology and hematology care.  To afford each patient quality time with our provider, please arrive at least 15 minutes before your scheduled appointment time.    If you have a lab appointment with the [facility] please come in thru the  Main Entrance and check in at the main information desk  You need to re-schedule your appointment should you arrive 10 or more minutes late.  We strive to give you quality time with our providers, and arriving late affects you and other patients whose appointments are after yours.  Also, if you no show three or more times for appointments you may be dismissed from the clinic at the providers discretion.     Again, thank you for choosing [facility].  Our hope is that these requests will decrease the amount of time that you wait before being seen by our physicians.       _____________________________________________________________  Should you have questions after your visit to [facility], please contact our office at [phone] between the hours of 8:30 a.m. and 4:30 p.m.  Voicemails left after 4:30 p.m. will not be returned until the following business day.  For prescription refill requests, have your pharmacy contact our office.       Resources For Cancer Patients and their Caregivers ? American Cancer Society: Can assist with transportation, wigs, general needs, runs Look Good Feel Better.        563-268-7352 ? Cancer Care: Provides financial assistance, online  support groups, medication/co-pay assistance.  1-800-813-HOPE (785) 806-9595) ? [place] Assists [place] cancer patients and their families through emotional , educational and financial support.  269 089 7732 ? [place] DSS Where to apply for food stamps, Medicaid and utility assistance. 984-852-2043 ? [facility]: Transportation to medical appointments. (580)042-3947 ? Social Security Administration: May apply for disability if have a Stage IV cancer. 769-148-8943 (832)515-2890 ? [employer], Disability and Transit Services: Assists with nutrition, care and transit needs. [place] Support Programs:   > Cancer Support Group  2nd Tuesday of the month 1pm-2pm, Journey Room   > Creative Journey  3rd Tuesday of the month 1130am-1pm, Journey Room

## 2017-11-30 NOTE — Patient Instructions (Addendum)
[facility] Discharge Instructions for Patients Receiving Chemotherapy   Beginning January 23rd 2017 lab work for the [facility] will be done in the  Main lab at [facility] on 1st floor. If you have a lab appointment with the [facility] please come in thru the  Main Entrance and check in at the main information desk   Today you received the following chemotherapy agents Taxol and Carboplatin as well as Neulasta on-pro. Follow-up as scheduled. Call clinic for any questions or concerns  To help prevent nausea and vomiting after your treatment, we encourage you to take your nausea medication   If you develop nausea and vomiting, or diarrhea that is not controlled by your medication, call the clinic.  The clinic phone number is [phone]. Office hours are Monday-Friday 8:30am-5:00pm.  BELOW ARE SYMPTOMS THAT SHOULD BE REPORTED IMMEDIATELY:  *FEVER GREATER THAN 101.0 F  *CHILLS WITH OR WITHOUT FEVER  NAUSEA AND VOMITING THAT IS NOT CONTROLLED WITH YOUR NAUSEA MEDICATION  *UNUSUAL SHORTNESS OF BREATH  *UNUSUAL BRUISING OR BLEEDING  TENDERNESS IN MOUTH AND THROAT WITH OR WITHOUT PRESENCE OF ULCERS  *URINARY PROBLEMS  *BOWEL PROBLEMS  UNUSUAL RASH Items with * indicate a potential emergency and should be followed up as soon as possible. If you have an emergency after office hours please contact your primary care physician or go to the nearest emergency department.  Please call the clinic during office hours if you have any questions or concerns.   You may also contact the Patient Navigator at [phone] should you have any questions or need assistance in obtaining follow up care.      Resources For Cancer Patients and their Caregivers ? American Cancer Society: Can assist with transportation, wigs, general needs, runs Look Good Feel Better.        574-759-5148 ? Cancer Care: Provides financial assistance, online support groups,  medication/co-pay assistance.  1-800-813-HOPE 435 872 2436) ? [facility] Assists [place] cancer patients and their families through emotional , educational and financial support.  609-127-9314 ? [place] DSS Where to apply for food stamps, Medicaid and utility assistance. (636)140-5990 ? RC[facility]: Transportation to medical appointments. 337-632-6156 ? Social Security Administration: May apply for disability if have a Stage IV cancer. (669)722-7475 [phone] ? [employer], Disability and Transit Services: Assists with nutrition, care and transit needs. (918)566-8963

## 2017-12-01 LAB — CA 125: CANCER ANTIGEN (CA) 125: 8.2 U/mL (ref 0.0–38.1)

## 2017-12-20 ENCOUNTER — Ambulatory Visit (HOSPITAL_COMMUNITY)
Admission: RE | Admit: 2017-12-20 | Discharge: 2017-12-20 | Disposition: A | Discharge status: Home / Self Care | Payer: Medicare Other | Source: Ambulatory Visit | Attending: Internal Medicine | Admitting: Internal Medicine

## 2017-12-20 DIAGNOSIS — C569 Malignant neoplasm of unspecified ovary: Secondary | ICD-10-CM | POA: Diagnosis not present

## 2017-12-20 DIAGNOSIS — N993 Prolapse of vaginal vault after hysterectomy: Secondary | ICD-10-CM | POA: Diagnosis not present

## 2017-12-20 DIAGNOSIS — I700 Atherosclerosis of aorta: Secondary | ICD-10-CM | POA: Insufficient documentation

## 2017-12-20 MED ORDER — IOPAMIDOL (ISOVUE-300) INJECTION 61%
100.0000 mL | Freq: Once | INTRAVENOUS | Status: AC | PRN
  Administered 2017-12-20: 100 mL via INTRAVENOUS

## 2017-12-22 ENCOUNTER — Other Ambulatory Visit: Payer: Self-pay

## 2017-12-22 ENCOUNTER — Encounter (HOSPITAL_COMMUNITY): Payer: Self-pay | Admitting: Internal Medicine

## 2017-12-22 ENCOUNTER — Inpatient Hospital Stay (HOSPITAL_COMMUNITY): Payer: Medicare Other

## 2017-12-22 ENCOUNTER — Inpatient Hospital Stay (HOSPITAL_COMMUNITY): Payer: Medicare Other | Attending: Oncology | Admitting: Internal Medicine

## 2017-12-22 VITALS — BP 139/57 | HR 83 | Temp 98.1°F | Resp 18 | Wt 160.0 lb

## 2017-12-22 DIAGNOSIS — Z9221 Personal history of antineoplastic chemotherapy: Secondary | ICD-10-CM | POA: Insufficient documentation

## 2017-12-22 DIAGNOSIS — C569 Malignant neoplasm of unspecified ovary: Secondary | ICD-10-CM | POA: Diagnosis not present

## 2017-12-22 DIAGNOSIS — I700 Atherosclerosis of aorta: Secondary | ICD-10-CM | POA: Insufficient documentation

## 2017-12-22 DIAGNOSIS — E538 Deficiency of other specified B group vitamins: Secondary | ICD-10-CM | POA: Diagnosis not present

## 2017-12-22 DIAGNOSIS — I10 Essential (primary) hypertension: Secondary | ICD-10-CM | POA: Diagnosis not present

## 2017-12-22 DIAGNOSIS — Z79899 Other long term (current) drug therapy: Secondary | ICD-10-CM

## 2017-12-22 DIAGNOSIS — C7800 Secondary malignant neoplasm of unspecified lung: Secondary | ICD-10-CM | POA: Insufficient documentation

## 2017-12-22 LAB — CBC WITH DIFFERENTIAL/PLATELET
BASOS ABS: 0 10*3/uL (ref 0.0–0.1)
Basophils Relative: 1 %
Eosinophils Absolute: 0.1 10*3/uL (ref 0.0–0.7)
Eosinophils Relative: 2 %
HEMATOCRIT: 29.9 % — AB (ref 36.0–46.0)
HEMOGLOBIN: 9.6 g/dL — AB (ref 12.0–15.0)
LYMPHS PCT: 19 %
Lymphs Abs: 1 10*3/uL (ref 0.7–4.0)
MCH: 35.3 pg — ABNORMAL HIGH (ref 26.0–34.0)
MCHC: 32.1 g/dL (ref 30.0–36.0)
MCV: 109.9 fL — AB (ref 78.0–100.0)
Monocytes Absolute: 0.9 10*3/uL (ref 0.1–1.0)
Monocytes Relative: 17 %
NEUTROS ABS: 3.2 10*3/uL (ref 1.7–7.7)
NEUTROS PCT: 61 %
PLATELETS: 398 10*3/uL (ref 150–400)
RBC: 2.72 MIL/uL — AB (ref 3.87–5.11)
RDW: 13.6 % (ref 11.5–15.5)
WBC: 5.2 10*3/uL (ref 4.0–10.5)

## 2017-12-22 LAB — COMPREHENSIVE METABOLIC PANEL
ALK PHOS: 81 U/L (ref 38–126)
ALT: 17 U/L (ref 14–54)
AST: 20 U/L (ref 15–41)
Albumin: 4.3 g/dL (ref 3.5–5.0)
Anion gap: 10 (ref 5–15)
BUN: 19 mg/dL (ref 6–20)
CALCIUM: 9.6 mg/dL (ref 8.9–10.3)
CHLORIDE: 105 mmol/L (ref 101–111)
CO2: 26 mmol/L (ref 22–32)
CREATININE: 0.95 mg/dL (ref 0.44–1.00)
GFR, EST NON AFRICAN AMERICAN: 58 mL/min — AB (ref >60)
Glucose, Bld: 90 mg/dL (ref 65–99)
Potassium: 4.5 mmol/L (ref 3.5–5.1)
Sodium: 141 mmol/L (ref 135–145)
Total Bilirubin: 0.8 mg/dL (ref 0.3–1.2)
Total Protein: 7.4 g/dL (ref 6.5–8.1)

## 2017-12-22 LAB — LACTATE DEHYDROGENASE: LDH: 162 U/L (ref 98–192)

## 2017-12-22 MED ORDER — HEPARIN SOD (PORK) LOCK FLUSH 100 UNIT/ML IV SOLN
500.0000 [IU] | Freq: Once | INTRAVENOUS | Status: AC
  Administered 2017-12-22: 500 [IU] via INTRAVENOUS

## 2017-12-22 MED ORDER — SODIUM CHLORIDE 0.9% FLUSH
20.0000 mL | INTRAVENOUS | Status: DC | PRN
  Administered 2017-12-22: 20 mL via INTRAVENOUS
  Filled 2017-12-22: qty 20

## 2017-12-22 NOTE — Progress Notes (Signed)
Diagnosis Malignant neoplasm of ovary, unspecified laterality ([facility]) - Plan: CBC with Differential/Platelet, Comprehensive metabolic panel, Lactate dehydrogenase, CBC with Differential/Platelet, Comprehensive metabolic panel, Lactate dehydrogenase, CA 125  Staging Cancer Staging Ovarian cancer [facility]) Staging form: Ovary, AJCC 7th Edition - Clinical stage from 04/27/2015: FIGO Stage IVB, calculated as Stage IV (T3c, N1, M1) - Signed by Baird Cancer, PA-C on 09/16/2015 - Pathologic stage from 05/08/2015: FIGO Stage IVB, calculated as Stage IV (TX, N1, M1) - Signed by Everitt Amber, MD on 05/08/2015 - Pathologic stage from 07/28/2015: FIGO Stage IV (T3c, NX, M1) - Signed by Baird Cancer, PA-C on 09/16/2015   Assessment and Plan:  1.  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 CT C/A/P11/12/18 demonstrated progressive disease with increase in size of previous LUQ peritoneal nodule, new left adrenal nodule and new perirectal nodule.  Pt was seen by Dr. Denman George on 08/14/17 and she recommended salvage chemotherapy with platinum and taxane for at least 6 cycles followed by PARP inhibitor maintenance with Niraparib.   CT scan done 10/30/2017 showed IMPRESSION: 1. No evidence of recurrent metastatic disease in the chest. 2. Significant partial response in the abdomen and pelvis. Peritoneal implants have significantly decreased and/or resolved, as detailed. No new or progressive metastatic disease. No ascites.  Patient has completed cycle 6 of Taxol and carboplatin on 11/30/2017 .  CT chest, abdomen and pelvis was done on 12/20/2017 shows:   IMPRESSION: 1. Further decrease in size of a gastrosplenic ligament nodule. No new or progressive disease. 2. Pelvic floor laxity with cystocele. 3.  Aortic Atherosclerosis (ICD10-I70.0).   Will discuss with Dr. Denman George.  Pt will have labs done today for follow-up after chemotherapy.  I have discussed with her Niraparib  300 mg once daily  for Parp inhibitor maintenance.   Side effects of the medication were reviewed which are primarily N/V, pancytopenia and elevated LFTs and she was provided written information.  She will be set up for chemotherapy teaching.  She will be seen in 1 month for follow-up to assess tolerance of therapy and will repeat labs at that time.   CA 125 was 8.2 on labs done in 11/2017.  Will repeat labs in May 2019.    2.  Hypertension.  Blood pressure is 139/57.  Follow-up with PCP.   3.  B12 deficiency.  Hemoglobin 10 on recent labs.  Patient should continue B12 as previously directed.    4.  RI.  Recent Cr was 0.9.  Will repeat labs today and in 4 weeks.    Current Status: Patient seen today for follow-up.  She is here to go over CT scans.  She completed cycle 6 of Taxol carboplatin.  She denies any bloating or abdominal pain.     Ovarian cancer ([place])   04/25/2015 - 04/28/2015 Hospital Admission    Nausea/diarrhea.  Oncology consult completed on 04/27/2015.      04/25/2015 Tumor Marker    CA 125- 3902.      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      04/27/2015 Procedure    US Paracentesis- A total of approximately 3700 mL of amber colored fluid was removed. A fluid sample was sent for laboratory analysis.      04/27/2015 Imaging    CT Chest- Mild left supraclavicular lymphadenopathy and moderate  mediastinal lymphadenopathy involving the prevascular, subcarinal and bilateral pericardiophrenic nodal chains, likely metastatic.      04/27/2015 Pathology Results    Diagnosis PERITONEAL/ASCITIC FLUID(SPECIMEN 1 OF 1 COLLECTED 04/27/15): MALIGNANT CELLS CONSISTENT WITH METASTATIC ADENOCARCINOMA.  The immunophenotype is most consistent with a gynecologic primary, most likely ovary.      05/08/2015 Miscellaneous    Seen by Dr. Denman George-  recommending Carboplatin/Paclitaxel x 3 cycles and return visit to see her (within 1 week of administration of third cycle) to evaluate for optimal sequencing of treatment modalities.      05/13/2015 - 05/15/2015 Hospital Admission    Hospitalized for AKI      05/19/2015 - 07/01/2015 Chemotherapy    Carboplatin/Paclitaxel x 3 cycles      07/28/2015 Surgery    Exploratory laparotomy with total abdominal hysterectomy, bilateral salpingo-oophorectomy, omentectomy radical tumor debulking for ovarian cancer; by Dr. Everitt Amber.      07/28/2015 Pathology Results    Uterus +/- tubes/ovaries, neoplastic, cervix - HIGH GRADE SEROUS CARCINOMA INVOLVING LEFT OVARY, LEFT FALLOPIAN TUBE AND RIGHT FALLOPIAN TUBE. - CERVIX, ENDOMETRIUM AND MYOMETRIUM ARE FREE OF TUMOR. 2. Cul-de-sac biop...      08/26/2015 -  Chemotherapy    Carboplatin/Paclitaxel x 3 cycles      12/23/2015 Miscellaneous    Genetic counseling.  Genetic testing was normal, and did not reveal a deleterious mutation in these genes      03/07/2017 Imaging    CT C/A/P IMPRESSION: Chest Impression:  No evidence of metastatic disease in thorax.  Abdomen / Pelvis Impression:  1. Interval increase in size of solitary nodular peritoneal implant in the LEFT upper quadrant . 2. No additional evidence of peritoneal nodularity. 3. No ascites.      07/31/2017 Progression    CT C/A/P: IMPRESSION: 1. Left upper quadrant nodule continues to progress, now measuring 20 x 26 mm. 2. New nodule identified between the in adrenal gland, concerning for metastatic deposit. 3. Interval development of tiny nodules in the left lower quadrant mesenteric, concerning for metastases. 4. No ascites. 5. Status post total abdominal hysterectomy and omentectomy.        Problem List Patient Active Problem List   Diagnosis Date Noted  . 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 Genetic testing [Z13.79] 12/15/2015   . Ileus, postoperative ([place]) [K91.89, K56.7] 08/01/2015  . Acute renal failure ([place]) [N17.9] 05/13/2015  . Malnutrition of moderate degree ([place]) [E44.0] 04/28/2015  . Renal failure [N19] 04/25/2015  . Nausea without vomiting [R11.0] 04/25/2015  . Ascites, malignant [R18.0] 04/25/2015  . Ovarian cancer ([place]) [C56.9] 04/25/2015  . Omental mass [K66.8] 04/25/2015  . Acute kidney injury ([place]) [N17.9] 04/25/2015  . UTI (lower urinary tract infection) [N39.0] 04/25/2015  . Normocytic anemia [D64.9] 04/25/2015  . 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 Schizophrenia, unspecified type ([place]) [F20.9] 04/25/2015  . Diarrhea [R19.7] 04/25/2015    Past Medical History Past Medical History:  Diagnosis Date  . Arthritis   . B12 deficiency   . Cataract   . History of blood transfusion   . History of chemotherapy   . Hypertension   . Iron deficiency anemia   . Mixed hyperlipidemia   . Ovarian cancer ([place])   . 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 Schizophrenia ([place])   . Ulcerative colitis ([place])   . Vitamin D deficiency     Past Surgical History Past Surgical History:  Procedure Laterality Date  . ABDOMINAL HYSTERECTOMY N/A 07/28/2015  Procedure: TOTAL HYSTERECTOMY ABDOMINAL BILATERAL SALPINGO OOPHRORECTOMY;  Surgeon: Everitt Amber, MD;  Location: [facility];  Service: Gynecology;  Laterality: N/A;  . DEBULKING N/A 07/28/2015   Procedure: DEBULKING;  Surgeon: Everitt Amber, MD;  Location: [facility];  Service: Gynecology;  Laterality: N/A;  . LAPAROTOMY N/A 07/28/2015   Procedure: EXPLORATORY LAPAROTOMY;  Surgeon: Everitt Amber, MD;  Location: [facility];  Service: Gynecology;  Laterality: N/A;  . OMENTECTOMY N/A 07/28/2015   Procedure: OMENTECTOMY ;  Surgeon: Everitt Amber, MD;  Location: [facility];  Service: Gynecology;  Laterality: N/A;  . PARACENTESIS  04/27/15  . PORTACATH PLACEMENT Right 04/2015  . REPLACEMENT TOTAL KNEE     right knee in 2003    Family History Family History  Problem Relation Age of  Onset  . Prostate cancer Brother   . Cancer Paternal Uncle        1 uncle with cancer NOS  . Lung cancer Maternal Grandmother   . Hypertension Maternal Grandfather   . Congestive Heart Failure Mother   . Seizures Sister      Social History  reports that she has never smoked. She has never used smokeless tobacco. She reports that she does not drink alcohol or use drugs.  Medications  Current Outpatient Medications:  .  acetaminophen (TYLENOL) 500 MG tablet, Take 2 tablets (1,000 mg total) by mouth every 12 (twelve) hours., Disp: 30 tablet, Rfl: 0 .  amLODipine (NORVASC) 10 MG tablet, Take 10 mg by mouth every evening. , Disp: , Rfl:  .  aspirin EC 81 MG tablet, Take 81 mg by mouth daily., Disp: , Rfl:  .  CARBOPLATIN IV, Inject into the vein. Every 3 weeks, Disp: , Rfl:  .  Cholecalciferol (VITAMIN D-3) 1000 UNITS CAPS, Take 1,000 Units by mouth daily. , Disp: , Rfl:  .  dexamethasone (DECADRON) 4 MG tablet, Take 2 tablets (8 mg total) by mouth daily. Start the day after chemotherapy for 2 days., Disp: 30 tablet, Rfl: 1 .  docusate sodium (COLACE) 100 MG capsule, Take 100 mg by mouth 2 (two) times daily., Disp: , Rfl:  .  folic acid (FOLVITE) 1 MG tablet, Take 1 mg by mouth daily., Disp: , Rfl:  .  lidocaine-prilocaine (EMLA) cream, Apply to affected area once, Disp: 30 g, Rfl: 3 .  lovastatin (MEVACOR) 10 MG tablet, Take 10 mg by mouth at bedtime., Disp: , Rfl:  .  metoprolol tartrate (LOPRESSOR) 25 MG tablet, Take 1 tablet (25 mg total) by mouth 2 (two) times daily., Disp: 60 tablet, Rfl: 6 .  mirtazapine (REMERON) 15 MG tablet, Take 7.5 mg by mouth at bedtime., Disp: , Rfl:  .  Multiple Vitamin (MULTIVITAMIN WITH MINERALS) TABS tablet, Take 1 tablet by mouth daily., Disp: , Rfl:  .  ondansetron (ZOFRAN) 8 MG tablet, Take 1 tablet (8 mg total) by mouth 2 (two) times daily as needed for refractory nausea / vomiting. Start on day 3 after chemo., Disp: 30 tablet, Rfl: 1 .  PACLitaxel  (TAXOL) 300 MG/50ML injection, Inject into the vein. Every 3 weeks, Disp: , Rfl:  .  prochlorperazine (COMPAZINE) 10 MG tablet, Take 1 tablet (10 mg total) by mouth every 6 (six) hours as needed (Nausea or vomiting)., Disp: 30 tablet, Rfl: 1 .  QUEtiapine (SEROQUEL XR) 300 MG 24 hr tablet, Take 300 mg by mouth at bedtime., Disp: , Rfl:  .  sulfaSALAzine (AZULFIDINE) 500 MG tablet, Take 500 mg by mouth 3 (three) times daily., Disp: ,  Rfl:  .  traMADol (ULTRAM) 50 MG tablet, Take 1 tablet (50 mg total) by mouth every 6 (six) hours as needed., Disp: 90 tablet, Rfl: 1 .  vitamin B-12 (CYANOCOBALAMIN) 1000 MCG tablet, Take 1,000 mcg by mouth daily., Disp: , Rfl:   Allergies Patient has no known allergies.  Review of Systems Review of Systems - Oncology ROS as per HPI otherwise 12 point ROS is negative.   Physical Exam  Vitals Wt Readings from Last 3 Encounters:  12/22/17 160 lb (72.6 kg)  11/30/17 161 lb 12.8 oz (73.4 kg)  11/09/17 161 lb 6.4 oz (73.2 kg)   Temp Readings from Last 3 Encounters:  12/22/17 98.1 F (36.7 C)  11/30/17 97.8 F (36.6 C) (Oral)  11/30/17 97.8 F (36.6 C) (Oral)   BP Readings from Last 3 Encounters:  12/22/17 (!) 139/57  11/30/17 (!) 151/70  11/30/17 (!) 146/68   Pulse Readings from Last 3 Encounters:  12/22/17 83  11/30/17 81  11/30/17 73   Constitutional: Well-developed, well-nourished, and in no distress.   HENT: Head: Normocephalic and atraumatic.  Mouth/Throat: No oropharyngeal exudate. Mucosa moist. Eyes: Pupils are equal, round, and reactive to light. Conjunctivae are normal. No scleral icterus.  Neck: Normal range of motion. Neck supple. No JVD present.  Cardiovascular: Normal rate, regular rhythm and normal heart sounds.  Exam reveals no gallop and no friction rub.   No murmur heard. Pulmonary/Chest: Effort normal and breath sounds normal. No respiratory distress. No wheezes.No rales.  Abdominal: Soft. Bowel sounds are normal. No  distension. There is no tenderness. There is no guarding.  Musculoskeletal: No edema or tenderness.  Lymphadenopathy: No cervical, axillary or supraclavicular adenopathy.  Neurological: Alert and oriented to person, place, and time. No cranial nerve deficit.  Skin: Skin is warm and dry. No rash noted. No erythema. No pallor.  Psychiatric: Affect and judgment normal.   Labs No visits with results within 3 Day(s) from this visit.  Latest known visit with results is:  Appointment on 11/30/2017  Component Date Value Ref Range Status  . WBC 11/30/2017 4.7  4.0 - 10.5 K/uL Final  . RBC 11/30/2017 2.78* 3.87 - 5.11 MIL/uL Final  . Hemoglobin 11/30/2017 9.9* 12.0 - 15.0 g/dL Final  . HCT 11/30/2017 30.8* 36.0 - 46.0 % Final  . MCV 11/30/2017 110.8* 78.0 - 100.0 fL Final  . MCH 11/30/2017 35.6* 26.0 - 34.0 pg Final  . MCHC 11/30/2017 32.1  30.0 - 36.0 g/dL Final  . RDW 11/30/2017 12.9  11.5 - 15.5 % Final  . Platelets 11/30/2017 316  150 - 400 K/uL Final  . Neutrophils Relative % 11/30/2017 68  % Final  . Lymphocytes Relative 11/30/2017 16  % Final  . Monocytes Relative 11/30/2017 13  % Final  . Eosinophils Relative 11/30/2017 2  % Final  . Basophils Relative 11/30/2017 1  % Final  . Neutro Abs 11/30/2017 3.2  1.7 - 7.7 K/uL Final  . Lymphs Abs 11/30/2017 0.8  0.7 - 4.0 K/uL Final  . Monocytes Absolute 11/30/2017 0.6  0.1 - 1.0 K/uL Final  . Eosinophils Absolute 11/30/2017 0.1  0.0 - 0.7 K/uL Final  . Basophils Absolute 11/30/2017 0.0  0.0 - 0.1 K/uL Final  . RBC Morphology 11/30/2017 MACROCYTES   Final   Performed at [facility], [address], [place], [place] [address]  . Sodium 11/30/2017 140  135 - 145 mmol/L Final  . Potassium 11/30/2017 4.3  3.5 - 5.1 mmol/L Final  .  Chloride 11/30/2017 104  101 - 111 mmol/L Final  . CO2 11/30/2017 27  22 - 32 mmol/L Final  . Glucose, Bld 11/30/2017 94  65 - 99 mg/dL Final  . BUN 11/30/2017 11  6 - 20 mg/dL Final  . Creatinine, Ser 11/30/2017  0.90  0.44 - 1.00 mg/dL Final  . Calcium 11/30/2017 9.3  8.9 - 10.3 mg/dL Final  . Total Protein 11/30/2017 6.8  6.5 - 8.1 g/dL Final  . Albumin 11/30/2017 4.1  3.5 - 5.0 g/dL Final  . AST 11/30/2017 25  15 - 41 U/L Final  . ALT 11/30/2017 20  14 - 54 U/L Final  . Alkaline Phosphatase 11/30/2017 85  38 - 126 U/L Final  . Total Bilirubin 11/30/2017 0.6  0.3 - 1.2 mg/dL Final  . GFR calc non Af Amer 11/30/2017 >60  >60 mL/min Final  . GFR calc Af Amer 11/30/2017 >60  >60 mL/min Final   Comment: (NOTE) The eGFR has been calculated using the CKD EPI equation. This calculation has not been validated in all clinical situations. eGFR's persistently <60 mL/min signify possible Chronic Kidney Disease.   Georgiann Hahn gap 11/30/2017 9  5 - 15 Final   Performed at [facility], [address], [place], [place] [address]  . Cancer Antigen (CA) 125 11/30/2017 8.2  0.0 - 38.1 U/mL Final   Comment: (NOTE) Roche Diagnostics Electrochemiluminescence Immunoassay (ECLIA) Values obtained with different assay methods or kits cannot be used interchangeably.  Results cannot be interpreted as absolute evidence of the presence or absence of malignant disease. Performed At: [facility] [place], [place] [id number] Rush Farmer MD [id number] Performed at [facility], [address] [place], [place] [address]   . LDH 11/30/2017 157  98 - 192 U/L Final   Performed at [facility], [address] [place], [place] [address]     Pathology Orders Placed This Encounter  Procedures  . CBC with Differential/Platelet    Standing Status:   Future    Number of Occurrences:   1    Standing Expiration Date:   12/23/2018  . Comprehensive metabolic panel    Standing Status:   Future    Number of Occurrences:   1    Standing Expiration Date:   12/23/2018  . Lactate dehydrogenase    Standing Status:   Future    Number of Occurrences:   1    Standing Expiration Date:   12/23/2018  . CBC with  Differential/Platelet    Standing Status:   Future    Standing Expiration Date:   12/23/2018  . Comprehensive metabolic panel    Standing Status:   Future    Standing Expiration Date:   12/23/2018  . Lactate dehydrogenase    Standing Status:   Future    Standing Expiration Date:   12/23/2018  . CA 125    Standing Status:   Future    Standing Expiration Date:   12/23/2018       Zoila Shutter MD

## 2017-12-22 NOTE — Progress Notes (Signed)
Latasha [name] tolerated port lab draw with flush well without complaints or incident. Port accessed with 20 gauge needle with blood drawn for labs ordered then flushed with 20 ml NS and 5 ml Heparin easily per protocol then de-accessed. Pt discharged self ambulatory in satisfactory condition accompanied by her son

## 2017-12-22 NOTE — Patient Instructions (Signed)
[place] at [facility] Discharge Instructions  Labs drawn from portacath with port flushed per protocol today. Follow-up as scheduled. Call clinic for any questions or concerns   Thank you for choosing [place] at [facility] to provide your oncology and hematology care.  To afford each patient quality time with our provider, please arrive at least 15 minutes before your scheduled appointment time.   If you have a lab appointment with the [place] please come in thru the  Main Entrance and check in at the main information desk  You need to re-schedule your appointment should you arrive 10 or more minutes late.  We strive to give you quality time with our providers, and arriving late affects you and other patients whose appointments are after yours.  Also, if you no show three or more times for appointments you may be dismissed from the clinic at the providers discretion.     Again, thank you for choosing [facility].  Our hope is that these requests will decrease the amount of time that you wait before being seen by our physicians.       _____________________________________________________________  Should you have questions after your visit to [facility], please contact our office at [phone] between the hours of 8:30 a.m. and 4:30 p.m.  Voicemails left after 4:30 p.m. will not be returned until the following business day.  For prescription refill requests, have your pharmacy contact our office.       Resources For Cancer Patients and their Caregivers ? American Cancer Society: Can assist with transportation, wigs, general needs, runs Look Good Feel Better.        7658374814 ? Cancer Care: Provides financial assistance, online support groups, medication/co-pay assistance.  1-800-813-HOPE 815-454-4684) ? [place] Assists [place] cancer patients and their families through  emotional , educational and financial support.  6812683777 ? [place] DSS Where to apply for food stamps, Medicaid and utility assistance. 419-294-5028 ? [employer]: Transportation to medical appointments. 570-610-0820 ? Social Security Administration: May apply for disability if have a Stage IV cancer. (754)606-5559 (321) 205-3729 ? [employer], Disability and Transit Services: Assists with nutrition, care and transit needs. [place] Support Programs:   > Cancer Support Group  2nd Tuesday of the month 1pm-2pm, Journey Room   > Creative Journey  3rd Tuesday of the month 1130am-1pm, Journey Room

## 2017-12-22 NOTE — Patient Instructions (Addendum)
[place] at [facility] Discharge Instructions  You were seen today by Dr. Walden Field. You were given information on a medication called Zejula. Continue port flushes every 2 months. Return in 1 month for labs and follow up.   Thank you for choosing [place] at [facility] to provide your oncology and hematology care.  To afford each patient quality time with our provider, please arrive at least 15 minutes before your scheduled appointment time.   If you have a lab appointment with the [place] please come in thru the  Main Entrance and check in at the main information desk  You need to re-schedule your appointment should you arrive 10 or more minutes late.  We strive to give you quality time with our providers, and arriving late affects you and other patients whose appointments are after yours.  Also, if you no show three or more times for appointments you may be dismissed from the clinic at the providers discretion.     Again, thank you for choosing [facility].  Our hope is that these requests will decrease the amount of time that you wait before being seen by our physicians.       _____________________________________________________________  Should you have questions after your visit to [facility], please contact our office at [phone] between the hours of 8:30 a.m. and 4:30 p.m.  Voicemails left after 4:30 p.m. will not be returned until the following business day.  For prescription refill requests, have your pharmacy contact our office.       Resources For Cancer Patients and their Caregivers ? American Cancer Society: Can assist with transportation, wigs, general needs, runs Look Good Feel Better.        (640)390-4427 ? Cancer Care: Provides financial assistance, online support groups, medication/co-pay assistance.  1-800-813-HOPE (802)125-5758) ? [place] Assists [place]  cancer patients and their families through emotional , educational and financial support.  (443) 212-0878 ? [place] DSS Where to apply for food stamps, Medicaid and utility assistance. 319 797 5176 ? [employer]: Transportation to medical appointments. 508 747 9817 ? Social Security Administration: May apply for disability if have a Stage IV cancer. 432-533-6509 415-173-1645 ? [employer], Disability and Transit Services: Assists with nutrition, care and transit needs. [place] Support Programs:   > Cancer Support Group  2nd Tuesday of the month 1pm-2pm, Journey Room   > Creative Journey  3rd Tuesday of the month 1130am-1pm, Journey Room

## 2017-12-25 ENCOUNTER — Other Ambulatory Visit (HOSPITAL_COMMUNITY): Payer: Self-pay | Admitting: Emergency Medicine

## 2017-12-25 MED ORDER — NIRAPARIB TOSYLATE 100 MG PO CAPS: 300.0000 mg | ORAL_CAPSULE | Freq: Every day | ORAL | 2 refills | Status: DC

## 2017-12-25 MED ORDER — NIRAPARIB TOSYLATE 100 MG PO CAPS: 100.0000 mg | ORAL_CAPSULE | Freq: Every day | ORAL | 2 refills | Status: DC

## 2017-12-27 ENCOUNTER — Inpatient Hospital Stay (HOSPITAL_COMMUNITY): Payer: Medicare Other

## 2017-12-27 NOTE — Progress Notes (Signed)
Pt taught about Zejula over the phone.  Side effects explained.  How to take the medication.  Pt will take 3 capsules (300 mg) daily with or without food.  Pt can take this in the evening to minimize nausea.  Pt still has two nausea meds at home, compazine and zofran.  We went over how to take these medications.  Went over how to take imodium of patient has diarrhea.  Pt will call RN when she receives the medication by mail.  Navigators number provided.  All questions answered.  Extensive teaching packet will be given when pt comes in for follow up appt.

## 2017-12-27 NOTE — Patient Instructions (Signed)
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  You will take 3 capsules (300 mg) daily.  This can be taken with or without food.  Take this medication at the same time each day and if you take this mediation in the evening, it may lessen the side effects of nausea.     Niraparib Noel Journey)  About This Drug Alanda Slim is used to treat cancer. It is given orally (by mouth).  Possible Side Effects . Bone marrow suppression. This is a decrease in the number of white blood cells, red blood cells, and platelets. This may raise your risk of infection, make you tired and weak, and raise your risk of bleeding. . Abnormal heart beat or palpitations . Nausea and vomiting (throwing up) . Soreness of the mouth and throat. You may have red areas, white patches or sores in your mouth that hurt. . Indigestion and/or heartburn . Dry mouth, change in the way food tastes . Pain in your abdomen . Bloating (distention) . Diarrhea (loose bowel movements) . Constipation (not able to move bowels) . Tiredness, weakness . Changes in your liver function . Urinary tract infection. . Decreased appetite (decreased hunger) . Pain in your joints, muscles and back . Headache . Feeling dizzy . Feeling nervous or anxious (worried) . Trouble sleeping . Cough and trouble breathing . Inflammation of the nose/pharynx . Rash . High blood pressure Note: Each of the side effects above was reported in 10% or greater of patients treated with niraparib. Not all possible side effects are included above.  Warnings and Precautions . This drug may raise your risk of getting a second cancer such Myelodysplastic Syndrome and leukemia, which can be life-threatening. . Severe bone marrow suppression . Severe high blood pressure Note: Some of the side effects above are very rare. If you have concerns and/or  questions, please discuss them with your medical team.  How to Take Your Medication . Swallow the medicine whole with or without food. Take your medicine at approximately the same time every day. Taking your medicine in the evening may help lessen nausea. . Missed dose: If you vomit or miss a dose, take your next dose at the regular time, and contact your physician. Do not take 2 doses at the same time and do not double up on the next dose. Marland Kitchen Handling: Wash your hands after handling your medicine, your caretakers should not handle your medicine with bare hands and should wear latex gloves. . This drug may be present in the saliva, tears, sweat, urine, stool, vomit, semen, and vaginal secretions. Talk to your doctor and/or your nurse about the necessary precautions to take during this time . Storage: Store this medicine in the original container at room temperature. . Disposal of unused medicine: Do not flush any expired and/or unused medicine down the toilet or drain unless you are specifically instructed to do so on the medication label. Some facilities have take-back programs and/or other options. If you do not have a take-back program in your area, then please discuss with your nurse or your doctor how to dispose of unused medicine.  Treating Side Effects . Manage tiredness by pacing your activities for the day. . Be sure to include periods of rest between energy-draining activities. . To decrease the risk of infection, wash your hands regularly. . Avoid close contact with people who have a cold, the  flu, or other infections. . To decrease your risk of bleeding, use a soft toothbrush. Check with your nurse before using dental floss. . Be very careful when using knives or tools. . Use an electric shaver instead of a razor. . Drink plenty of fluids (a minimum of eight glasses per day is recommended). . If you throw up or have loose bowel movements, you should drink more fluids so that you do not  become dehydrated (lack of water in the body from losing too much fluid). . If you get diarrhea, eat low-fiber foods that are high in protein and calories and avoid foods that can irritate your digestive tracts or lead to cramping. . Ask your nurse or doctor about medicine that can lessen or stop your diarrhea. . To help with nausea and vomiting, eat small, frequent meals instead of three large meals a day. Choose foods and drinks that are at room temperature. Ask your nurse or doctor about other helpful tips and medicine that is available to help or stop lessen these symptoms. . To help with decreased appetite, eat foods high in calories and protein, such as meat, poultry, fish, dry beans, tofu, eggs, nuts, milk, yogurt, cheese, ice cream, pudding, and nutritional supplements. . Consider using sauces and spices to increase taste. Daily exercise, with your doctor's approval, may increase your appetite. . If you are not able to move your bowels, check with your doctor or nurse before you use enemas, laxatives, or suppositories. . Avoid gas producing foods, such as Brussels sprouts, cabbage, cauliflower, carrots, prunes and apricots. . Mouth care is very important. Your mouth care should consist of routine, gentle cleaning of your teeth or dentures and rinsing your mouth with a mixture of 1/2 teaspoon of salt in 8 ounces of water or 1/2 teaspoon of baking soda in 8 ounces of water. This should be done at least after each meal and at bedtime. . If you have mouth sores, avoid mouthwash that has alcohol. Also avoid alcohol and smoking because they can bother your mouth and throat. . Sugar-free hard candies and chewing gum can keep your mouth moist. . Taking good care of your mouth may help food taste better and improve your appetite. Marland Kitchen Keeping your pain under control is important to your well-being. Please tell your doctor or nurse if you are experiencing pain. . If you are feeling anxious, talk to your  nurse or doctor about it and they may be able to offer you some stress-relief techniques and/or support groups that may help relieve your anxiety. . If you get a rash do not put anything on it unless your doctor or nurse says you may. Keep the area around the rash clean and dry. Ask your doctor for medicine if your rash bothers you.  Food and Drug Interactions . There are no known interactions of niraparib with food. . This drug may interact with other medicines. Tell your doctor and pharmacist about all the prescription and over-the-counter medicines and dietary supplements (vitamins, minerals, herbs and others) that you are taking at this time. Also, check with your doctor or pharmacist before starting any new prescription or over-the-counter medicines, or dietary supplements to make sure that there are no interactions.  When to Call the Doctor Call your doctor or nurse if you have any of these symptoms and/or any new or unusual symptoms: . Fever of 100.4 F (38 C) or higher . Chills . Fatigue that interferes with your daily activities . Feeling dizzy or  lightheaded . A headache that does not go away . Blurry vision or other changes in eyesight . Easy bleeding or bruising . Feeling that your heart is beating fast or in a not normal way (palpitations) . Wheezing or trouble breathing . Pain in your mouth or throat that makes it hard to eat or drink . Lasting loss of appetite or rapid weight loss of five pounds in a week . Nausea that stops you from eating or drinking and/or is not relieved by prescribed medicines . Throwing up more than 3 times a day . Diarrhea, 4 times in one day or diarrhea with lack of strength or a feeling of being dizzy . No bowel movement in 3 days or when you feel uncomfortable . Pain that does not go away, or is not relieved by prescribed medicines . New rash and/or itching . Rash that is not relieved by prescribed medicines . Signs of liver problems: dark urine,  pale bowel movements, bad stomach pain, feeling very tired and weak, unusual itching, or yellowing of the eyes or skin . Pain or burning when you pass urine . Difficulty urinating . Feeling like you have to pass urine often, but not much comes out when you do. . Tender or heavy feeling in your lower abdomen. . Cloudy urine and/or urine that smells bad. . Pain on one side of your back under your ribs. This is where your kidneys are. . If you think you are pregnant  Reproduction Warnings . Pregnancy warning: This drug can have harmful effects on the unborn baby. Women of childbearing potential should use effective methods of birth control during your cancer treatment and for at least 6 months after treatment. Let your doctor know right away if you think you may be pregnant. . Breastfeeding warning: Women should not breastfeed during treatment and for 1 month after treatment because this drug could enter the breast milk and cause harm to a breastfeeding baby. . Fertility warning: Human fertility studies have not been done with this drug. Talk with your doctor or nurse if you plan to have children. Ask for information on sperm or egg banking.        SELF CARE ACTIVITIES WHILE ON CHEMOTHERAPY: Hydration Increase your fluid intake 48 hours prior to treatment and drink at least 8 to 12 cups (64 ounces) of water/decaff beverages per day after treatment. You can still have your cup of coffee or soda but these beverages do not count as part of your 8 to 12 cups that you need to drink daily. No alcohol intake.  Medications Continue taking your normal prescription medication as prescribed.  If you start any new herbal or new supplements, please let us know first to make sure it is safe.  Mouth Care Have teeth cleaned professionally before starting treatment. Keep dentures and partial plates clean. Use soft toothbrush and do not use mouthwashes that contain alcohol. Biotene is a good mouthwash that is  available at most pharmacies or may be ordered by calling [phone]. Use warm salt water gargles (1 teaspoon salt per 1 quart warm water) before and after meals and at bedtime. Or you may rinse with 2 tablespoons of three-percent hydrogen peroxide mixed in eight ounces of water. If you are still having problems with your mouth or sores in your mouth please call the clinic. If you need dental work, please let the doctor know before you go for your appointment so that we can coordinate the best possible time for  you in regards to your chemo regimen. You need to also let your dentist know that you are actively taking chemo. We may need to do labs prior to your dental appointment.  Skin Care Always use sunscreen that has not expired and with SPF (Sun Protection Factor) of 50 or higher. Wear hats to protect your head from the sun. Remember to use sunscreen on your hands, ears, face, & feet.  Use good moisturizing lotions such as udder cream, eucerin, or even Vaseline. Some chemotherapies can cause dry skin, color changes in your skin and nails.    . Avoid long, hot showers or baths. . Use gentle, fragrance-free soaps and laundry detergent. . Use moisturizers, preferably creams or ointments rather than lotions because the thicker consistency is better at preventing skin dehydration. Apply the cream or ointment within 15 minutes of showering. Reapply moisturizer at night, and moisturize your hands every time after you wash them.  Hair Loss (if your doctor says your hair will fall out)  . If your doctor says that your hair is likely to fall out, decide before you begin chemo whether you want to wear a wig. You may want to shop before treatment to match your hair color. . Hats, turbans, and scarves can also camouflage hair loss, although some people prefer to leave their heads uncovered. If you go bare-headed outdoors, be sure to use sunscreen on your scalp. . Cut your hair short. It eases the  inconvenience of shedding lots of hair, but it also can reduce the emotional impact of watching your hair fall out. . Don't perm or color your hair during chemotherapy. Those chemical treatments are already damaging to hair and can enhance hair loss. Once your chemo treatments are done and your hair has grown back, it's OK to resume dyeing or perming hair. With chemotherapy, hair loss is almost always temporary. But when it grows back, it may be a different color or texture. In older adults who still had hair color before chemotherapy, the new growth may be completely gray.  Often, new hair is very fine and soft.  Infection Prevention Please wash your hands for at least 30 seconds using warm soapy water. Handwashing is the #1 way to prevent the spread of germs. Stay away from sick people or people who are getting over a cold. If you develop respiratory systems such as green/yellow mucus production or productive cough or persistent cough let us know and we will see if you need an antibiotic. It is a good idea to keep a pair of gloves on when going into grocery stores/[employer] to decrease your risk of coming into contact with germs on the carts, etc. Carry alcohol hand gel with you at all times and use it frequently if out in public. If your temperature reaches 100.5 or higher please call the clinic and let us know.  If it is after hours or on the weekend please go to the ER if your temperature is over 100.5.  Please have your own personal thermometer at home to use.    Sex and bodily fluids If you are going to have sex, a condom must be used to protect the person that isn't taking chemotherapy. Chemo can decrease your libido (sex drive). For a few days after chemotherapy, chemotherapy can be excreted through your bodily fluids.  When using the toilet please close the lid and flush the toilet twice.  Do this for a few day after you have had chemotherapy.   Effects  of chemotherapy on your sex life Some  changes are simple and won't last long. They won't affect your sex life permanently. Sometimes you may feel: . too tired . not strong enough to be very active . sick or sore  . not in the mood . anxious or low Your anxiety might not seem related to sex. For example, you may be worried about the cancer and how your treatment is going. Or you may be worried about money, or about how you family are coping with your illness. These things can cause stress, which can affect your interest in sex. It's important to talk to your partner about how you feel. Remember - the changes to your sex life don't usually last long. There's usually no medical reason to stop having sex during chemo. The drugs won't have any long term physical effects on your performance or enjoyment of sex. Cancer can't be passed on to your partner during sex  Contraception It's important to use reliable contraception during treatment. Avoid getting pregnant while you or your partner are having chemotherapy. This is because the drugs may harm the baby. Sometimes chemotherapy drugs can leave a man or woman infertile.  This means you would not be able to have children in the future. You might want to talk to someone about permanent infertility. It can be very difficult to learn that you may no longer be able to have children. Some people find counselling helpful. There might be ways to preserve your fertility, although this is easier for men than for women. You may want to speak to a fertility expert. You can talk about sperm banking or harvesting your eggs. You can also ask about other fertility options, such as donor eggs. If you have or have had breast cancer, your doctor might advise you not to take the contraceptive pill. This is because the hormones in it might affect the cancer.  It is not known for sure whether or not chemotherapy drugs can be passed on through semen or secretions from the vagina. Because of this some doctors advise  people to use a barrier method if you have sex during treatment. This applies to vaginal, anal or oral sex. Generally, doctors advise a barrier method only for the time you are actually having the treatment and for about a week after your treatment. Advice like this can be worrying, but this does not mean that you have to avoid being intimate with your partner. You can still have close contact with your partner and continue to enjoy sex.  Animals If you have cats or birds we just ask that you not change the litter or change the cage.  Please have someone else do this for you while you are on chemotherapy.   Food Safety During and After Cancer Treatment Food safety is important for people both during and after cancer treatment. Cancer and cancer treatments, such as chemotherapy, radiation therapy, and stem cell/bone marrow transplantation, often weaken the immune system. This makes it harder for your body to protect itself from foodborne illness, also called food poisoning. Foodborne illness is caused by eating food that contains harmful bacteria, parasites, or viruses.    Foods to avoid Some foods have a higher risk of becoming tainted with bacteria. These include: Marland Kitchen Unwashed fresh fruit and vegetables, especially leafy vegetables that can hide dirt and other contaminants . Raw sprouts, such as alfalfa sprouts . Raw or undercooked beef, especially ground beef, or other raw or undercooked meat and poultry .  Fatty, fried, or spicy foods immediately before or after treatment.  These can sit heavy on your stomach and make you feel nauseous. . Raw or undercooked shellfish, such as oysters. . Sushi and sashimi, which often contain raw fish.  . Unpasteurized beverages, such as unpasteurized fruit juices, raw milk, raw yogurt, or cider . Undercooked eggs, such as soft boiled, over easy, and poached; raw, unpasteurized eggs; or foods made with raw egg, such as homemade raw cookie dough and homemade  mayonnaise Simple steps for food safety Shop smart. . Do not buy food stored or displayed in an unclean area. . Do not buy bruised or damaged fruits or vegetables. . Do not buy cans that have cracks, dents, or bulges. . Pick up foods that can spoil at the end of your shopping trip and store them in a cooler on the way home. Prepare and clean up foods carefully. . Rinse all fresh fruits and vegetables under running water, and dry them with a clean towel or paper towel. . Clean the top of cans before opening them. . After preparing food, wash your hands for 20 seconds with hot water and soap. Pay special attention to areas between fingers and under nails. . Clean your utensils and dishes with hot water and soap. Marland Kitchen Disinfect your kitchen and cutting boards using 1 teaspoon of liquid, unscented bleach mixed into 1 quart of water.   Dispose of old food. . Eat canned and packaged food before its expiration date (the "use by" or "best before" date). . Consume refrigerated leftovers within 3 to 4 days. After that time, throw out the food. Even if the food does not smell or look spoiled, it still may be unsafe. Some bacteria, such as Listeria, can grow even on foods stored in the refrigerator if they are kept for too long. Take precautions when eating out. . At restaurants, avoid buffets and salad bars where food sits out for a long time and comes in contact with many people. Food can become contaminated when someone with a virus, often a norovirus, or another "bug" handles it. . Put any leftover food in a "to-go" container yourself, rather than having the server do it. And, refrigerate leftovers as soon as you get home. . Choose restaurants that are clean and that are willing to prepare your food as you order it cooked.   Prescription Meds:                                                                                                                                                           Zofran/Ondansetron 13m tablet. Take 1 tablet every 8 hours as needed for nausea/vomiting. (#1 nausea med to take, this can constipate)  Compazine/Prochlorperazine 171mtablet. Take 1 tablet every 6 hours as needed for nausea/vomiting. (#2 nausea  med to take, this can make you sleepy)  Over-the-Counter Meds: Miralax 1 capful in 8 oz of fluid daily. May increase to two times a day if needed. This is a stool softener. If this doesn't work proceed you can add:  Senokot S-start with 1 tablet two times a day and increase to 4 tablets two times a day if needed. (total of 8 tablets in a 24 hour period). This is a stimulant laxative.   Call us if this does not help your bowels move.   Imodium 46m capsule. Take 2 capsules after the 1st loose stool and then 1 capsule every 2 hours until you go a total of 12 hours without having a loose stool. [facility] the [place]. If diarrhea occurs @ bedtime, take 2 capsules @ bedtime. Then take 2 capsules every 4 hours until morning. Call [facility]    Diarrhea Sheet  If you are having loose stools/diarrhea, please purchase Imodium and begin taking as outlined:  At the first sign of poorly formed or loose stools you should begin taking Imodium(loperamide) 2 mg capsules.  Take two caplets (435m followed by one caplet (58m44mevery 2 hours until you have had no diarrhea for 12 hours.  During the night take two caplets (4mg38mt bedtime and continue every 4 hours during the night until the morning.  Stop taking Imodium only after there is no sign of diarrhea for 12 hours.    Always call the [facility]you are having loose stools/diarrhea that you can't get under control.  Loose stools/diarrhea leads to dehydration (loss of water) in your body.  We have other options of trying to get the loose stools/diarrhea to stopped but you must let us kKoreaw!    Constipation Sheet *Miralax in 8 oz of fluid daily.  May increase to two times a day if  needed.  This is a stool softener.  If this not enough to keep your bowel regular:  You can add:  *Senokot S, start with one tablet twice a day and can increase to 4 tablets twice a day if needed.  This is a stimulant laxative.  Sometimes when you take pain medication you need BOTH a medicine to keep your stool soft and a medicine to help your bowel push it out! Please call if the above does not work for you.  Do not go more than 2 days without a bowel movement.  It is very important that you do not become constipated.  It will make you feel sick to your stomach (nausea) and can cause abdominal pain and vomiting.    Nausea Sheet   Zofran/Ondansetron 8mg 23mlet. Take 1 tablet every 8 hours as needed for nausea/vomiting. (#1 nausea med to take, this can constipate)  Compazine/Prochlorperazine 10mg 33met. Take 1 tablet every 6 hours as needed for nausea/vomiting. (#2 nausea med to take, this can make you sleepy)  You can take these medications together or separately.  We would first like for you to try the Ondansetron by itself and then take the Prochloperazine if needed. But you are allowed to take both medications at the same time if your nausea is that severe.  If you are having persistent nausea (nausea that does not stop) please take these medications on a staggered schedule so that the nausea medication stays in your body.  Please call the [facility] kno[facility]athe amount of nausea that you are experiencing.  If you begin to  vomit, you need to call the [place] and if it is the weekend and you have vomited more than one time and can't get it to stop-go to the Emergency Room.  Persistent nausea/vomiting can lead to dehydration (loss of fluid in your body) and will make you feel terrible.   Ice chips, sips of clear liquids, foods that are @ room temperature, crackers, and toast tend to be better tolerated.        SYMPTOMS TO REPORT AS SOON AS POSSIBLE AFTER  TREATMENT:  FEVER GREATER THAN 100.5 F  CHILLS WITH OR WITHOUT FEVER  NAUSEA AND VOMITING THAT IS NOT CONTROLLED WITH YOUR NAUSEA MEDICATION  UNUSUAL SHORTNESS OF BREATH  UNUSUAL BRUISING OR BLEEDING  TENDERNESS IN MOUTH AND THROAT WITH OR WITHOUT PRESENCE OF ULCERS  URINARY PROBLEMS  BOWEL PROBLEMS  UNUSUAL RASH      Wear comfortable clothing and clothing appropriate for easy access to any Portacath or PICC line. Let us know if there is anything that we can do to make your therapy better!     What to do if you need assistance after hours or on the weekends: CALL [phone].  HOLD on the line, do not hang up.  You will hear multiple messages but at the end you will be connected with a nurse triage line.  They will contact the doctor if necessary.  Most of the time they will be able to assist you.  Do not call the hospital operator.       I have been informed and understand all of the instructions given to me and have received a copy. I have been instructed to call the clinic [phone] or my family physician as soon as possible for continued medical care, if indicated. I do not have any more questions at this time but understand that I may call the [place] or the Patient Navigator at [phone] during office hours should I have questions or need assistance in obtaining follow-up care.

## 2017-12-28 ENCOUNTER — Telehealth: Payer: Self-pay | Admitting: *Deleted

## 2017-12-28 NOTE — Telephone Encounter (Signed)
Called and left the patient a message to call the office back. Patient needs to have an appt in July with Dr. Denman George

## 2017-12-29 ENCOUNTER — Telehealth: Payer: Self-pay | Admitting: *Deleted

## 2017-12-29 NOTE — Telephone Encounter (Signed)
Called and spoke with the patient. Scheduled the follow up appt for July

## 2018-01-03 ENCOUNTER — Telehealth (HOSPITAL_COMMUNITY): Payer: Self-pay | Admitting: Emergency Medicine

## 2018-01-03 NOTE — Telephone Encounter (Signed)
Pt received her medication on 12/29/2017 and started taking it as prescribed on 12/30/2017 that evening.  Pt has had no problems thus far.

## 2018-01-23 ENCOUNTER — Inpatient Hospital Stay (HOSPITAL_COMMUNITY): Payer: Medicare Other | Attending: Hematology

## 2018-01-23 ENCOUNTER — Other Ambulatory Visit (HOSPITAL_COMMUNITY): Payer: Self-pay | Admitting: Internal Medicine

## 2018-01-23 ENCOUNTER — Ambulatory Visit (HOSPITAL_COMMUNITY): Payer: Medicare Other | Admitting: Hematology

## 2018-01-23 ENCOUNTER — Other Ambulatory Visit: Payer: Self-pay

## 2018-01-23 ENCOUNTER — Inpatient Hospital Stay (HOSPITAL_COMMUNITY): Payer: Medicare Other | Attending: Hematology | Admitting: Internal Medicine

## 2018-01-23 ENCOUNTER — Encounter (HOSPITAL_COMMUNITY): Payer: Self-pay | Admitting: Internal Medicine

## 2018-01-23 VITALS — BP 156/69 | HR 81 | Temp 97.6°F | Resp 16 | Wt 157.6 lb

## 2018-01-23 DIAGNOSIS — C786 Secondary malignant neoplasm of retroperitoneum and peritoneum: Secondary | ICD-10-CM | POA: Insufficient documentation

## 2018-01-23 DIAGNOSIS — C7800 Secondary malignant neoplasm of unspecified lung: Secondary | ICD-10-CM | POA: Diagnosis not present

## 2018-01-23 DIAGNOSIS — Z79899 Other long term (current) drug therapy: Secondary | ICD-10-CM | POA: Insufficient documentation

## 2018-01-23 DIAGNOSIS — E538 Deficiency of other specified B group vitamins: Secondary | ICD-10-CM | POA: Insufficient documentation

## 2018-01-23 DIAGNOSIS — I700 Atherosclerosis of aorta: Secondary | ICD-10-CM | POA: Diagnosis not present

## 2018-01-23 DIAGNOSIS — I10 Essential (primary) hypertension: Secondary | ICD-10-CM | POA: Diagnosis not present

## 2018-01-23 DIAGNOSIS — C569 Malignant neoplasm of unspecified ovary: Secondary | ICD-10-CM

## 2018-01-23 DIAGNOSIS — N993 Prolapse of vaginal vault after hysterectomy: Secondary | ICD-10-CM | POA: Diagnosis not present

## 2018-01-23 LAB — COMPREHENSIVE METABOLIC PANEL
ALBUMIN: 4.3 g/dL (ref 3.5–5.0)
ALK PHOS: 85 U/L (ref 38–126)
ALT: 32 U/L (ref 14–54)
AST: 35 U/L (ref 15–41)
Anion gap: 9 (ref 5–15)
BILIRUBIN TOTAL: 0.5 mg/dL (ref 0.3–1.2)
BUN: 20 mg/dL (ref 6–20)
CALCIUM: 9.5 mg/dL (ref 8.9–10.3)
CO2: 28 mmol/L (ref 22–32)
Chloride: 103 mmol/L (ref 101–111)
Creatinine, Ser: 1.3 mg/dL — ABNORMAL HIGH (ref 0.44–1.00)
GFR calc Af Amer: 46 mL/min — ABNORMAL LOW (ref >60)
GFR calc non Af Amer: 39 mL/min — ABNORMAL LOW (ref >60)
GLUCOSE: 96 mg/dL (ref 65–99)
Potassium: 3.9 mmol/L (ref 3.5–5.1)
Sodium: 140 mmol/L (ref 135–145)
TOTAL PROTEIN: 7.3 g/dL (ref 6.5–8.1)

## 2018-01-23 LAB — CBC WITH DIFFERENTIAL/PLATELET
BASOS ABS: 0 10*3/uL (ref 0.0–0.1)
BASOS PCT: 1 %
Eosinophils Absolute: 0.4 10*3/uL (ref 0.0–0.7)
Eosinophils Relative: 8 %
HEMATOCRIT: 29.4 % — AB (ref 36.0–46.0)
Hemoglobin: 9.7 g/dL — ABNORMAL LOW (ref 12.0–15.0)
Lymphocytes Relative: 25 %
Lymphs Abs: 1.2 10*3/uL (ref 0.7–4.0)
MCH: 35.7 pg — ABNORMAL HIGH (ref 26.0–34.0)
MCHC: 33 g/dL (ref 30.0–36.0)
MCV: 108.1 fL — ABNORMAL HIGH (ref 78.0–100.0)
Monocytes Absolute: 0.6 10*3/uL (ref 0.1–1.0)
Monocytes Relative: 13 %
NEUTROS ABS: 2.5 10*3/uL (ref 1.7–7.7)
NEUTROS PCT: 53 %
Platelets: 275 10*3/uL (ref 150–400)
RBC: 2.72 MIL/uL — ABNORMAL LOW (ref 3.87–5.11)
RDW: 12.6 % (ref 11.5–15.5)
WBC: 4.7 10*3/uL (ref 4.0–10.5)

## 2018-01-23 LAB — LACTATE DEHYDROGENASE: LDH: 193 U/L — ABNORMAL HIGH (ref 98–192)

## 2018-01-23 NOTE — Progress Notes (Signed)
Diagnosis Malignant neoplasm of ovary, unspecified laterality ([place])  Ovarian CA, unspecified laterality ([place]) - Plan: CBC with Differential/Platelet, Comprehensive metabolic panel, Lactate dehydrogenase, Methylmalonic acid, serum, Vitamin B12, Folate, CT Chest W Contrast, CT Abdomen Pelvis W Contrast  Staging Cancer Staging Ovarian cancer [facility]) Staging form: Ovary, AJCC 7th Edition - Clinical stage from 04/27/2015: FIGO Stage IVB, calculated as Stage IV (T3c, N1, M1) - Signed by Baird Cancer, PA-C on 09/16/2015 - Pathologic stage from 05/08/2015: FIGO Stage IVB, calculated as Stage IV (TX, N1, M1) - Signed by Everitt Amber, MD on 05/08/2015 - Pathologic stage from 07/28/2015: FIGO Stage IV (T3c, NX, M1) - Signed by Baird Cancer, PA-C on 09/16/2015   Assessment and Plan:  1.  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 CT C/A/P11/12/18 demonstrated progressive disease with increase in size of previous LUQ peritoneal nodule, new left adrenal nodule and new perirectal nodule.  Pt was seen by Dr. Denman George on 08/14/17 and she recommended salvage chemotherapy with platinum and taxane for at least 6 cycles followed by PARP inhibitor maintenance with Niraparib.   CT scan done 10/30/2017 showed IMPRESSION: 1. No evidence of recurrent metastatic disease in the chest. 2. Significant partial response in the abdomen and pelvis. Peritoneal implants have significantly decreased and/or resolved, as detailed. No new or progressive metastatic disease. No ascites.  Patient has completed cycle 6 of Taxol and carboplatin on 11/30/2017 .  CT chest, abdomen and pelvis was done on 12/20/2017 shows:   IMPRESSION: 1. Further decrease in size of a gastrosplenic ligament nodule. No new or progressive disease. 2. Pelvic floor laxity with cystocele. 3.  Aortic Atherosclerosis (ICD10-I70.0).   Pt is on Niraparib 300 mg once daily  for Parp inhibitor maintenance.   She is  tolerating therapy.  Labs done today 01/23/2018 shows WBC 4.7 HB 9.7 plts 275,000.  Cr 1.3, K + 3.9 LFTs WNL .  She will be set up for CT CAP in 03/2018 and will follow-up to go over results.  She is scheduled to see Dr. Denman George 04/04/2018 for follow-up.  She is tolerating medication other than minimal nausea.  CA 125 was 8.2 on labs done in 11/2017.  Will repeat labs on follow-up in 03/2018.    2.  Hypertension.  Blood pressure is 156/69.  Follow-up with PCP.   3.  B12 deficiency.  Hemoglobin 9.7 on recent labs.  Patient should continue B12 as previously directed.  Will check B12, MMA, folate on labs in 03/2018.  MCV elevated but improving at 108.    4.  RI.  Cr. 1.3.  Encourage hydration.  Will repeat labs in 03/2018.    5.  Nausea.  She reports this occurs in the am.  Continue antiemetics as recommended.    6.  Health maintenance.  Continue mammogram screening as recommended.  GI evaluation as recommended.    Current Status: Patient seen today for follow-up.  She is here to go over labs.  She is tolerating Niraparib.  She reports minimal nausea.  She denies any bloating or abdominal pain.    Ovarian cancer ([place])   04/25/2015 - 04/28/2015 Hospital Admission    Nausea/diarrhea.  Oncology consult completed on 04/27/2015.      04/25/2015 Tumor Marker    CA 125- 3902.      CEA WNL      04/25/2015 Imaging    CT Abd/pelvis- Significant ascites with multiple peritoneal based soft tissue masses within the pelvis likely representing  ovarian cancer with peritoneal carcinomatosis.      04/27/2015 Procedure    US Paracentesis- A total of approximately 3700 mL of amber colored fluid was removed. A fluid sample was sent for laboratory analysis.      04/27/2015 Imaging    CT Chest- Mild left supraclavicular lymphadenopathy and moderate mediastinal lymphadenopathy involving the prevascular, subcarinal and bilateral pericardiophrenic nodal chains, likely metastatic.      04/27/2015 Pathology Results    Diagnosis  PERITONEAL/ASCITIC FLUID(SPECIMEN 1 OF 1 COLLECTED 04/27/15): MALIGNANT CELLS CONSISTENT WITH METASTATIC ADENOCARCINOMA.  The immunophenotype is most consistent with a gynecologic primary, most likely ovary.      05/08/2015 Miscellaneous    Seen by Dr. Denman George- recommending Carboplatin/Paclitaxel x 3 cycles and return visit to see her (within 1 week of administration of third cycle) to evaluate for optimal sequencing of treatment modalities.      05/13/2015 - 05/15/2015 Hospital Admission    Hospitalized for AKI      05/19/2015 - 07/01/2015 Chemotherapy    Carboplatin/Paclitaxel x 3 cycles      07/28/2015 Surgery    Exploratory laparotomy with total abdominal hysterectomy, bilateral salpingo-oophorectomy, omentectomy radical tumor debulking for ovarian cancer; by Dr. Everitt Amber.      07/28/2015 Pathology Results    Uterus +/- tubes/ovaries, neoplastic, cervix - HIGH GRADE SEROUS CARCINOMA INVOLVING LEFT OVARY, LEFT FALLOPIAN TUBE AND RIGHT FALLOPIAN TUBE. - CERVIX, ENDOMETRIUM AND MYOMETRIUM ARE FREE OF TUMOR. 2. Cul-de-sac biop...      08/26/2015 -  Chemotherapy    Carboplatin/Paclitaxel x 3 cycles      12/23/2015 Miscellaneous    Genetic counseling.  Genetic testing was normal, and did not reveal a deleterious mutation in these genes      03/07/2017 Imaging    CT C/A/P IMPRESSION: Chest Impression:  No evidence of metastatic disease in thorax.  Abdomen / Pelvis Impression:  1. Interval increase in size of solitary nodular peritoneal implant in the LEFT upper quadrant . 2. No additional evidence of peritoneal nodularity. 3. No ascites.      07/31/2017 Progression    CT C/A/P: IMPRESSION: 1. Left upper quadrant nodule continues to progress, now measuring 20 x 26 mm. 2. New nodule identified between the in adrenal gland, concerning for metastatic deposit. 3. Interval development of tiny nodules in the left lower quadrant mesenteric, concerning for metastases. 4. No  ascites. 5. Status post total abdominal hysterectomy and omentectomy.        Problem List Patient Active Problem List   Diagnosis Date Noted  . 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 Genetic testing [Z13.79] 12/15/2015  . Ileus, postoperative ([place]) [K91.89, K56.7] 08/01/2015  . Acute renal failure ([place]) [N17.9] 05/13/2015  . Malnutrition of moderate degree ([place]) [E44.0] 04/28/2015  . Renal failure [N19] 04/25/2015  . Nausea without vomiting [R11.0] 04/25/2015  . Ascites, malignant [R18.0] 04/25/2015  . Ovarian cancer ([place]) [C56.9] 04/25/2015  . Omental mass [K66.8] 04/25/2015  . Acute kidney injury ([place]) [N17.9] 04/25/2015  . UTI (lower urinary tract infection) [N39.0] 04/25/2015  . Normocytic anemia [D64.9] 04/25/2015  . 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 Schizophrenia, unspecified type ([place]) [F20.9] 04/25/2015  . Diarrhea [R19.7] 04/25/2015    Past Medical History Past Medical History:  Diagnosis Date  . Arthritis   . B12 deficiency   . Cataract   . History of blood transfusion   . History of chemotherapy   . Hypertension   . Iron deficiency anemia   .  Mixed hyperlipidemia   . Ovarian cancer ([place])   . 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 Schizophrenia ([place])   . Ulcerative colitis ([place])   . Vitamin D deficiency     Past Surgical History Past Surgical History:  Procedure Laterality Date  . ABDOMINAL HYSTERECTOMY N/A 07/28/2015   Procedure: TOTAL HYSTERECTOMY ABDOMINAL BILATERAL SALPINGO OOPHRORECTOMY;  Surgeon: Everitt Amber, MD;  Location: [facility];  Service: Gynecology;  Laterality: N/A;  . DEBULKING N/A 07/28/2015   Procedure: DEBULKING;  Surgeon: Everitt Amber, MD;  Location: [facility];  Service: Gynecology;  Laterality: N/A;  . LAPAROTOMY N/A 07/28/2015   Procedure: EXPLORATORY LAPAROTOMY;  Surgeon: Everitt Amber, MD;  Location: [facility];  Service: Gynecology;  Laterality: N/A;  . OMENTECTOMY N/A 07/28/2015   Procedure:  OMENTECTOMY ;  Surgeon: Everitt Amber, MD;  Location: [facility];  Service: Gynecology;  Laterality: N/A;  . PARACENTESIS  04/27/15  . PORTACATH PLACEMENT Right 04/2015  . REPLACEMENT TOTAL KNEE     right knee in 2003    Family History Family History  Problem Relation Age of Onset  . Prostate cancer Brother   . Cancer Paternal Uncle        1 uncle with cancer NOS  . Lung cancer Maternal Grandmother   . Hypertension Maternal Grandfather   . Congestive Heart Failure Mother   . Seizures Sister      Social History  reports that she has never smoked. She has never used smokeless tobacco. She reports that she does not drink alcohol or use drugs.  Medications  Current Outpatient Medications:  .  acetaminophen (TYLENOL) 500 MG tablet, Take 2 tablets (1,000 mg total) by mouth every 12 (twelve) hours., Disp: 30 tablet, Rfl: 0 .  amLODipine (NORVASC) 10 MG tablet, Take 10 mg by mouth every evening. , Disp: , Rfl:  .  aspirin EC 81 MG tablet, Take 81 mg by mouth daily., Disp: , Rfl:  .  Cholecalciferol (VITAMIN D-3) 1000 UNITS CAPS, Take 1,000 Units by mouth daily. , Disp: , Rfl:  .  docusate sodium (COLACE) 100 MG capsule, Take 100 mg by mouth 2 (two) times daily., Disp: , Rfl:  .  folic acid (FOLVITE) 1 MG tablet, Take 1 mg by mouth daily., Disp: , Rfl:  .  lidocaine-prilocaine (EMLA) cream, Apply to affected area once, Disp: 30 g, Rfl: 3 .  lovastatin (MEVACOR) 10 MG tablet, Take 10 mg by mouth at bedtime., Disp: , Rfl:  .  metoprolol tartrate (LOPRESSOR) 25 MG tablet, Take 1 tablet (25 mg total) by mouth 2 (two) times daily., Disp: 60 tablet, Rfl: 6 .  mirtazapine (REMERON) 15 MG tablet, Take 7.5 mg by mouth at bedtime., Disp: , Rfl:  .  Multiple Vitamin (MULTIVITAMIN WITH MINERALS) TABS tablet, Take 1 tablet by mouth daily., Disp: , Rfl:  .  Niraparib Tosylate (ZEJULA) 100 MG CAPS, Take 300 mg by mouth daily., Disp: 90 capsule, Rfl: 2 .  ondansetron (ZOFRAN) 8 MG tablet, Take 1 tablet (8 mg  total) by mouth 2 (two) times daily as needed for refractory nausea / vomiting. Start on day 3 after chemo., Disp: 30 tablet, Rfl: 1 .  prochlorperazine (COMPAZINE) 10 MG tablet, Take 1 tablet (10 mg total) by mouth every 6 (six) hours as needed (Nausea or vomiting)., Disp: 30 tablet, Rfl: 1 .  QUEtiapine (SEROQUEL XR) 300 MG 24 hr tablet, Take 300 mg by mouth at bedtime., Disp: , Rfl:  .  sulfaSALAzine (AZULFIDINE) 500 MG tablet, Take  500 mg by mouth 3 (three) times daily., Disp: , Rfl:  .  traMADol (ULTRAM) 50 MG tablet, Take 1 tablet (50 mg total) by mouth every 6 (six) hours as needed., Disp: 90 tablet, Rfl: 1 .  vitamin B-12 (CYANOCOBALAMIN) 1000 MCG tablet, Take 1,000 mcg by mouth daily., Disp: , Rfl:   Allergies Patient has no known allergies.  Review of Systems Review of Systems - Oncology ROS as per HPI otherwise 12 point ROS is negative.   Physical Exam  Vitals Wt Readings from Last 3 Encounters:  01/23/18 157 lb 9.6 oz (71.5 kg)  12/22/17 160 lb (72.6 kg)  11/30/17 161 lb 12.8 oz (73.4 kg)   Temp Readings from Last 3 Encounters:  01/23/18 97.6 F (36.4 C) (Oral)  12/22/17 98.1 F (36.7 C)  11/30/17 97.8 F (36.6 C) (Oral)   BP Readings from Last 3 Encounters:  01/23/18 (!) 156/69  12/22/17 (!) 139/57  11/30/17 (!) 151/70   Pulse Readings from Last 3 Encounters:  01/23/18 81  12/22/17 83  11/30/17 81    Constitutional: Well-developed, well-nourished, and in no distress.   HENT: Head: Normocephalic and atraumatic.  Mouth/Throat: No oropharyngeal exudate. Mucosa moist. Eyes: Pupils are equal, round, and reactive to light. Conjunctivae are normal. No scleral icterus.  Neck: Normal range of motion. Neck supple. No JVD present.  Cardiovascular: Normal rate, regular rhythm and normal heart sounds.  Exam reveals no gallop and no friction rub.   No murmur heard. Pulmonary/Chest: Effort normal and breath sounds normal. No respiratory distress. No wheezes.No rales.   Abdominal: Soft. Bowel sounds are normal. No distension. There is no tenderness. There is no guarding.  Musculoskeletal: No edema or tenderness.  Lymphadenopathy: No cervical, axillary or supraclavicular adenopathy.  Neurological: Alert and oriented to person, place, and time. No cranial nerve deficit.  Skin: Skin is warm and dry. No rash noted. No erythema. No pallor.  Psychiatric: Affect and judgment normal.   Labs Appointment on 01/23/2018  Component Date Value Ref Range Status  . WBC 01/23/2018 4.7  4.0 - 10.5 K/uL Final  . RBC 01/23/2018 2.72* 3.87 - 5.11 MIL/uL Final  . Hemoglobin 01/23/2018 9.7* 12.0 - 15.0 g/dL Final  . HCT 01/23/2018 29.4* 36.0 - 46.0 % Final  . MCV 01/23/2018 108.1* 78.0 - 100.0 fL Final  . MCH 01/23/2018 35.7* 26.0 - 34.0 pg Final  . MCHC 01/23/2018 33.0  30.0 - 36.0 g/dL Final  . RDW 01/23/2018 12.6  11.5 - 15.5 % Final  . Platelets 01/23/2018 275  150 - 400 K/uL Final  . Neutrophils Relative % 01/23/2018 53  % Final  . Neutro Abs 01/23/2018 2.5  1.7 - 7.7 K/uL Final  . Lymphocytes Relative 01/23/2018 25  % Final  . Lymphs Abs 01/23/2018 1.2  0.7 - 4.0 K/uL Final  . Monocytes Relative 01/23/2018 13  % Final  . Monocytes Absolute 01/23/2018 0.6  0.1 - 1.0 K/uL Final  . Eosinophils Relative 01/23/2018 8  % Final  . Eosinophils Absolute 01/23/2018 0.4  0.0 - 0.7 K/uL Final  . Basophils Relative 01/23/2018 1  % Final  . Basophils Absolute 01/23/2018 0.0  0.0 - 0.1 K/uL Final   Performed at [facility], [address], [place], [place] [address]  . Sodium 01/23/2018 140  135 - 145 mmol/L Final  . Potassium 01/23/2018 3.9  3.5 - 5.1 mmol/L Final  . Chloride 01/23/2018 103  101 - 111 mmol/L Final  . CO2 01/23/2018 28  22 -  32 mmol/L Final  . Glucose, Bld 01/23/2018 96  65 - 99 mg/dL Final  . BUN 01/23/2018 20  6 - 20 mg/dL Final  . Creatinine, Ser 01/23/2018 1.30* 0.44 - 1.00 mg/dL Final  . Calcium 01/23/2018 9.5  8.9 - 10.3 mg/dL Final  . Total Protein  01/23/2018 7.3  6.5 - 8.1 g/dL Final  . Albumin 01/23/2018 4.3  3.5 - 5.0 g/dL Final  . AST 01/23/2018 35  15 - 41 U/L Final  . ALT 01/23/2018 32  14 - 54 U/L Final  . Alkaline Phosphatase 01/23/2018 85  38 - 126 U/L Final  . Total Bilirubin 01/23/2018 0.5  0.3 - 1.2 mg/dL Final  . GFR calc non Af Amer 01/23/2018 39* >60 mL/min Final  . GFR calc Af Amer 01/23/2018 46* >60 mL/min Final   Comment: (NOTE) The eGFR has been calculated using the CKD EPI equation. This calculation has not been validated in all clinical situations. eGFR's persistently <60 mL/min signify possible Chronic Kidney Disease.   Georgiann Hahn gap 01/23/2018 9  5 - 15 Final   Performed at [facility], [address], [place], [place] [address]  . LDH 01/23/2018 193* 98 - 192 U/L Final   Performed at [facility], [address], [place], [place] [address]     Pathology Orders Placed This Encounter  Procedures  . CT Chest W Contrast    Standing Status:   Future    Standing Expiration Date:   01/23/2019    Order Specific Question:   If indicated for the ordered procedure, I authorize the administration of contrast media per Radiology protocol    Answer:   Yes    Order Specific Question:   Preferred imaging location?    Answer:   [facility]    Order Specific Question:   Radiology Contrast Protocol - do NOT remove file path    Answer:   [email or web address]  . CT Abdomen Pelvis W Contrast    Standing Status:   Future    Standing Expiration Date:   01/23/2019    Order Specific Question:   If indicated for the ordered procedure, I authorize the administration of contrast media per Radiology protocol    Answer:   Yes    Order Specific Question:   Preferred imaging location?    Answer:   [facility]    Order Specific Question:   Is Oral Contrast requested for this exam?    Answer:   Yes, Per Radiology protocol    Order Specific Question:   Radiology Contrast Protocol - do NOT remove  file path    Answer:   [email or web address]  . CBC with Differential/Platelet    Standing Status:   Future    Standing Expiration Date:   01/24/2019  . Comprehensive metabolic panel    Standing Status:   Future    Standing Expiration Date:   01/24/2019  . Lactate dehydrogenase    Standing Status:   Future    Standing Expiration Date:   01/24/2019  . Methylmalonic acid, serum    Standing Status:   Future    Standing Expiration Date:   01/23/2019  . Vitamin B12    Standing Status:   Future    Standing Expiration Date:   01/23/2019  . Folate    Standing Status:   Future    Standing Expiration Date:   01/23/2019       Zoila Shutter MD

## 2018-01-24 LAB — CA 125: CANCER ANTIGEN (CA) 125: 6.7 U/mL (ref 0.0–38.1)

## 2018-02-21 ENCOUNTER — Encounter (HOSPITAL_COMMUNITY): Payer: Medicare Other

## 2018-03-21 ENCOUNTER — Other Ambulatory Visit (HOSPITAL_COMMUNITY): Payer: Self-pay | Admitting: *Deleted

## 2018-03-21 MED ORDER — NIRAPARIB TOSYLATE 100 MG PO CAPS: 300.0000 mg | ORAL_CAPSULE | Freq: Every day | ORAL | 2 refills | Status: DC

## 2018-03-26 ENCOUNTER — Inpatient Hospital Stay (HOSPITAL_COMMUNITY): Payer: Medicare Other | Attending: Oncology

## 2018-03-26 ENCOUNTER — Encounter (HOSPITAL_COMMUNITY): Payer: Self-pay

## 2018-03-26 ENCOUNTER — Other Ambulatory Visit: Payer: Self-pay

## 2018-03-26 DIAGNOSIS — C7800 Secondary malignant neoplasm of unspecified lung: Secondary | ICD-10-CM | POA: Insufficient documentation

## 2018-03-26 DIAGNOSIS — C786 Secondary malignant neoplasm of retroperitoneum and peritoneum: Secondary | ICD-10-CM | POA: Insufficient documentation

## 2018-03-26 DIAGNOSIS — Z7982 Long term (current) use of aspirin: Secondary | ICD-10-CM | POA: Insufficient documentation

## 2018-03-26 DIAGNOSIS — Z9221 Personal history of antineoplastic chemotherapy: Secondary | ICD-10-CM | POA: Diagnosis not present

## 2018-03-26 DIAGNOSIS — E538 Deficiency of other specified B group vitamins: Secondary | ICD-10-CM | POA: Insufficient documentation

## 2018-03-26 DIAGNOSIS — G4700 Insomnia, unspecified: Secondary | ICD-10-CM | POA: Insufficient documentation

## 2018-03-26 DIAGNOSIS — Z90722 Acquired absence of ovaries, bilateral: Secondary | ICD-10-CM | POA: Diagnosis not present

## 2018-03-26 DIAGNOSIS — C569 Malignant neoplasm of unspecified ovary: Secondary | ICD-10-CM | POA: Diagnosis present

## 2018-03-26 DIAGNOSIS — I10 Essential (primary) hypertension: Secondary | ICD-10-CM | POA: Insufficient documentation

## 2018-03-26 DIAGNOSIS — Z79899 Other long term (current) drug therapy: Secondary | ICD-10-CM | POA: Insufficient documentation

## 2018-03-26 DIAGNOSIS — I700 Atherosclerosis of aorta: Secondary | ICD-10-CM | POA: Insufficient documentation

## 2018-03-26 DIAGNOSIS — Z90710 Acquired absence of both cervix and uterus: Secondary | ICD-10-CM | POA: Diagnosis not present

## 2018-03-26 LAB — COMPREHENSIVE METABOLIC PANEL
ALBUMIN: 4.3 g/dL (ref 3.5–5.0)
ALT: 32 U/L (ref 0–44)
AST: 35 U/L (ref 15–41)
Alkaline Phosphatase: 82 U/L (ref 38–126)
Anion gap: 7 (ref 5–15)
BILIRUBIN TOTAL: 0.5 mg/dL (ref 0.3–1.2)
BUN: 13 mg/dL (ref 8–23)
CO2: 29 mmol/L (ref 22–32)
CREATININE: 1.26 mg/dL — AB (ref 0.44–1.00)
Calcium: 9.2 mg/dL (ref 8.9–10.3)
Chloride: 104 mmol/L (ref 98–111)
GFR calc Af Amer: 47 mL/min — ABNORMAL LOW (ref >60)
GFR calc non Af Amer: 41 mL/min — ABNORMAL LOW (ref >60)
GLUCOSE: 88 mg/dL (ref 70–99)
Potassium: 3.7 mmol/L (ref 3.5–5.1)
Sodium: 140 mmol/L (ref 135–145)
TOTAL PROTEIN: 7.5 g/dL (ref 6.5–8.1)

## 2018-03-26 LAB — CBC WITH DIFFERENTIAL/PLATELET
BASOS ABS: 0 10*3/uL (ref 0.0–0.1)
BASOS PCT: 1 %
EOS ABS: 0.2 10*3/uL (ref 0.0–0.7)
EOS PCT: 4 %
HCT: 30.3 % — ABNORMAL LOW (ref 36.0–46.0)
Hemoglobin: 9.9 g/dL — ABNORMAL LOW (ref 12.0–15.0)
Lymphocytes Relative: 20 %
Lymphs Abs: 0.9 10*3/uL (ref 0.7–4.0)
MCH: 35.9 pg — ABNORMAL HIGH (ref 26.0–34.0)
MCHC: 32.7 g/dL (ref 30.0–36.0)
MCV: 109.8 fL — AB (ref 78.0–100.0)
MONO ABS: 0.7 10*3/uL (ref 0.1–1.0)
Monocytes Relative: 16 %
Neutro Abs: 2.8 10*3/uL (ref 1.7–7.7)
Neutrophils Relative %: 59 %
PLATELETS: 319 10*3/uL (ref 150–400)
RBC: 2.76 MIL/uL — ABNORMAL LOW (ref 3.87–5.11)
RDW: 13.5 % (ref 11.5–15.5)
WBC: 4.7 10*3/uL (ref 4.0–10.5)

## 2018-03-26 LAB — VITAMIN B12: Vitamin B-12: 1393 pg/mL — ABNORMAL HIGH (ref 180–914)

## 2018-03-26 LAB — LACTATE DEHYDROGENASE: LDH: 209 U/L — ABNORMAL HIGH (ref 98–192)

## 2018-03-26 MED ORDER — HEPARIN SOD (PORK) LOCK FLUSH 100 UNIT/ML IV SOLN
500.0000 [IU] | Freq: Once | INTRAVENOUS | Status: AC
  Administered 2018-03-26: 500 [IU] via INTRAVENOUS

## 2018-03-26 MED ORDER — SODIUM CHLORIDE 0.9% FLUSH
10.0000 mL | INTRAVENOUS | Status: DC | PRN
  Administered 2018-03-26: 10 mL via INTRAVENOUS
  Filled 2018-03-26: qty 10

## 2018-03-26 NOTE — Progress Notes (Signed)
Larina Bras presented for Portacath access and flush.  Portacath located right chest wall accessed with  H 20g needle.  Good blood return present. Portacath flushed with 6m NS and 500U/520mHeparin and needle removed intact.  Procedure tolerated well and without incident. Discharged ambulatory in c/o son.

## 2018-03-27 ENCOUNTER — Other Ambulatory Visit (HOSPITAL_COMMUNITY): Payer: Self-pay | Admitting: Internal Medicine

## 2018-03-27 DIAGNOSIS — C562 Malignant neoplasm of left ovary: Secondary | ICD-10-CM

## 2018-03-27 LAB — CA 125: CANCER ANTIGEN (CA) 125: 6.6 U/mL (ref 0.0–38.1)

## 2018-03-27 LAB — FOLATE: FOLATE: 100 ng/mL (ref >5.9)

## 2018-03-28 LAB — METHYLMALONIC ACID, SERUM: Methylmalonic Acid, Quantitative: 232 nmol/L (ref 0–378)

## 2018-04-04 ENCOUNTER — Inpatient Hospital Stay: Payer: Medicare Other | Attending: Gynecologic Oncology | Admitting: Gynecologic Oncology

## 2018-04-04 ENCOUNTER — Encounter: Payer: Self-pay | Admitting: Gynecologic Oncology

## 2018-04-04 VITALS — BP 148/75 | HR 84 | Temp 98.5°F | Resp 18 | Ht 66.0 in | Wt 159.6 lb

## 2018-04-04 DIAGNOSIS — C786 Secondary malignant neoplasm of retroperitoneum and peritoneum: Secondary | ICD-10-CM | POA: Diagnosis not present

## 2018-04-04 DIAGNOSIS — Z90722 Acquired absence of ovaries, bilateral: Secondary | ICD-10-CM

## 2018-04-04 DIAGNOSIS — Z90710 Acquired absence of both cervix and uterus: Secondary | ICD-10-CM | POA: Insufficient documentation

## 2018-04-04 DIAGNOSIS — C569 Malignant neoplasm of unspecified ovary: Secondary | ICD-10-CM | POA: Diagnosis present

## 2018-04-04 DIAGNOSIS — Z9221 Personal history of antineoplastic chemotherapy: Secondary | ICD-10-CM | POA: Diagnosis not present

## 2018-04-04 NOTE — Progress Notes (Signed)
Followup Note: Gyn-Onc  Larina Bras 77 y.o. 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 Malignant neoplasm of ovary, unspecified laterality [facility])    Assessment/Plan:  Ms. Arleta Ostrum  is a 77 y.o.  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 BRCA negative on testing.  S/p salvage 2nd line chemotherapy with carb/taxol completed 11/30/17. Now on Niraparib since March, 2019.  No measurable disease. Tolerating maintenance PARPi. Recommend continuing this until 24 months disease free at which time it can be suspended.  I will see Forrestine back in 3 months for a pelvic exam as part of surveillance.   HPI: Inetha Maret is a very pleasant 77 year old G1 who is seen in consultation at the request of Dr Whitney Muse for (clinical) stage IVB ovarian cancer. The patient developed symptoms of progressive abdominal fullness and discomfort over the course of several months. In August, 2016 she presented to the [facility] where imaging of the abdomen and pelvis was obtained for diarrhea, nausea and emesis. Imaging on 04/25/15 revealed large volume ascites, carcinomatosis and peritoneal masses measuring up to 5.1cm. The patient underwent paracentesis with cytology on 04/27/15 which revealed metastatic adenocarcinoma consistent on immunostains with gyn primary.  CT of the chest on 04/27/15 revealed mild left supraclavicular lymphadenopathy, and mediastinal lymphadenopathy consistent with metastatic disease. There were <41m pulmonary nodules also seen which were indeterminant. There were trace pleural effusions seen.   CA 125 on 04/25/15: 3902.  She then went on to receive 3 cycles of paclitaxel and carboplatin chemotherapy with Dr PWhitney Muse Cycle 3 was on 07/01/15.  CA 125 on 07/01/15 was 249.  CT scan on 07/20/15 showed: Previously noted ascites has resolved. No pneumoperitoneum. Many of the  previously noted peritoneal implants are no longer confidently identified on today's examination. There are few smaller implants noted, the largest of which is in th  left side of the abdomen anteriorly measuring 9 x 11 mm. There is also a 10 x 7 mm lesion superficial to the splenic flexure of the colon which is substantially smaller than the prior study (previously 2.8 x 1.7 cm).  There was resolution of the chest adenopathy.  She has tolerated chemotherapy well with a good appetites and good general function.  On 07/31/15 she was taken to the OR for an ex lap, TAH, BSO, omentectomy, radical tumor debulking. Intraoperative findings included: Excellent response to neoadjuvant chemotherapy. Small volume plaques/nodules in left and right posterior cul de sac behind uterus, tumor on left ovary (3cm), multiple 2cm nodules in omentum. No other peritoneal disease. Diaphragm normal.  She had an optimal cytoreduction (R0) with no gross visible disease remaining.  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  Postoperatively she did very well with no postoperative complications.  She went on to receive 3 additional cycles of carboplatin and paclitaxel completed April, 2017. She tolerated therapy very well.  Post treatment scans were unremarkable and showed no residual disease.  Her CA 125 was monitored at 3 monthly intervals. In January, 2018 it was normal at 17. On April, 20th ,2018 it had increased to 32. On June 15th, 2018 it had increased again to 47. CT chest/abdo/pelvis on 03/07/17 showed solitary pertoneal nodule in the gastrosplenic ligament measuring 130mx 1633mincreased from a prior scan where it was 1cm). No additional nodularity is noted nor ascites.  She was offered secondary  cytoreduction surgery followed by chemotherapy, however she declined this and opted for expectant management because she felt good.  Interval Hx:  On 07/31/17 repeat CT abdo/pelvis  was performed. It showed progression of peritoneal disease with an enlargement of a peri-splenic lesion, a new nodule adjacent to the adrenal gland and a new left lower quadrant peritoneal metastases.  She agreed to commend salvage 2nd line chemotherapy with carboplatin and paclitaxel in December, 2018 due to the onset of symptomatic recurrence. She completed 6 cycles of this on 11/30/17 and was then changed to Niraparib maintenance 337m daily starting in March, 2019.  CA 125 was normal at 9 on 03/26/18.   She continues to feel well with no abdominal symptoms and an excellent performance status (ECOG 0).  She denies thrombocytopenia. She has mild occasional nausea.   Current Meds:  Outpatient Encounter Medications as of 04/04/2018  Medication Sig  . acetaminophen (TYLENOL) 500 MG tablet Take 2 tablets (1,000 mg total) by mouth every 12 (twelve) hours.  .Marland KitchenamLODipine (NORVASC) 10 MG tablet Take 10 mg by mouth every evening.   .Marland Kitchenaspirin EC 81 MG tablet Take 81 mg by mouth daily.  . Cholecalciferol (VITAMIN D-3) 1000 UNITS CAPS Take 1,000 Units by mouth daily.   .Marland Kitchendocusate sodium (COLACE) 100 MG capsule Take 100 mg by mouth 2 (two) times daily.  . folic acid (FOLVITE) 1 MG tablet Take 1 mg by mouth daily.  .Marland Kitchenlidocaine-prilocaine (EMLA) cream Apply to affected area once  . lovastatin (MEVACOR) 10 MG tablet Take 10 mg by mouth at bedtime.  . metoprolol tartrate (LOPRESSOR) 25 MG tablet Take 1 tablet (25 mg total) by mouth 2 (two) times daily.  . mirtazapine (REMERON) 15 MG tablet Take 7.5 mg by mouth at bedtime.  . Multiple Vitamin (MULTIVITAMIN WITH MINERALS) TABS tablet Take 1 tablet by mouth daily.  .Alanda SlimTosylate (ZEJULA) 100 MG CAPS Take 300 mg by mouth daily.  . ondansetron (ZOFRAN) 8 MG tablet Take 1 tablet (8 mg total) by mouth 2 (two) times daily as needed for refractory nausea / vomiting. Start on day 3 after chemo.  . prochlorperazine (COMPAZINE) 10 MG tablet Take 1 tablet (10 mg  total) by mouth every 6 (six) hours as needed (Nausea or vomiting).  . QUEtiapine (SEROQUEL XR) 300 MG 24 hr tablet Take 300 mg by mouth at bedtime.  . sulfaSALAzine (AZULFIDINE) 500 MG tablet Take 500 mg by mouth 3 (three) times daily.  . traMADol (ULTRAM) 50 MG tablet Take 1 tablet (50 mg total) by mouth every 6 (six) hours as needed.  . vitamin B-12 (CYANOCOBALAMIN) 1000 MCG tablet Take 1,000 mcg by mouth daily.   No facility-administered encounter medications on file as of 04/04/2018.     Allergy: No Known Allergies  Social Hx:   Social History   Socioeconomic History  . Marital status: Single    Spouse name: Not on file  . Number of children: 1  . Years of education: Not on file  . Highest education level: Not on file  Occupational History  . Not on file  Social Needs  . Financial resource strain: Not on file  . Food insecurity:    Worry: Not on file    Inability: Not on file  . Transportation needs:    Medical: Not on file    Non-medical: Not on file  Tobacco Use  . Smoking status: Never Smoker  . Smokeless tobacco: Never Used  Substance and Sexual Activity  .  Alcohol use: No  . Drug use: No  . Sexual activity: Not Currently  Lifestyle  . Physical activity:    Days per week: Not on file    Minutes per session: Not on file  . Stress: Not on file  Relationships  . Social connections:    Talks on phone: Not on file    Gets together: Not on file    Attends religious service: Not on file    Active member of club or organization: Not on file    Attends meetings of clubs or organizations: Not on file    Relationship status: Not on file  . Intimate partner violence:    Fear of current or ex partner: Not on file    Emotionally abused: Not on file    Physically abused: Not on file    Forced sexual activity: Not on file  Other Topics Concern  . Not on file  Social History Narrative  . Not on file    Past Surgical Hx:  Past Surgical History:  Procedure  Laterality Date  . ABDOMINAL HYSTERECTOMY N/A 07/28/2015   Procedure: TOTAL HYSTERECTOMY ABDOMINAL BILATERAL SALPINGO OOPHRORECTOMY;  Surgeon: Everitt Amber, MD;  Location: [facility];  Service: Gynecology;  Laterality: N/A;  . DEBULKING N/A 07/28/2015   Procedure: DEBULKING;  Surgeon: Everitt Amber, MD;  Location: [facility];  Service: Gynecology;  Laterality: N/A;  . LAPAROTOMY N/A 07/28/2015   Procedure: EXPLORATORY LAPAROTOMY;  Surgeon: Everitt Amber, MD;  Location: [facility];  Service: Gynecology;  Laterality: N/A;  . OMENTECTOMY N/A 07/28/2015   Procedure: OMENTECTOMY ;  Surgeon: Everitt Amber, MD;  Location: [facility];  Service: Gynecology;  Laterality: N/A;  . PARACENTESIS  04/27/15  . PORTACATH PLACEMENT Right 04/2015  . REPLACEMENT TOTAL KNEE     right knee in 2003    Past Medical Hx:  Past Medical History:  Diagnosis Date  . Arthritis   . B12 deficiency   . Cataract   . History of blood transfusion   . History of chemotherapy   . Hypertension   . Iron deficiency anemia   . Mixed hyperlipidemia   . Ovarian cancer ([place])   . 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 Schizophrenia ([place])   . Ulcerative colitis ([place])   . Vitamin D deficiency     Past Gynecological History:  SVD x 1  No LMP recorded. Patient is postmenopausal.  Family Hx:  Family History  Problem Relation Age of Onset  . Prostate cancer Brother   . Cancer Paternal Uncle        1 uncle with cancer NOS  . Lung cancer Maternal Grandmother   . Hypertension Maternal Grandfather   . Congestive Heart Failure Mother   . Seizures Sister     Review of Systems:  Constitutional  Feels well,    ENT Normal appearing ears and nares bilaterally Skin/Breast  No rash, sores, jaundice, itching, dryness Cardiovascular  No chest pain, shortness of breath, or edema  Pulmonary  No cough or wheeze.  Gastro Intestinal  No nausea, vomitting, or diarrhoea. No bright red blood per rectum, no abdominal pain, change in bowel movement, or constipation.  Genito  Urinary  No frequency, urgency, dysuria, no postmenopausal bleeding Musculo Skeletal  No myalgia, arthralgia, joint swelling or pain  Neurologic  No weakness, numbness, change in gait,  Psychology  No depression, anxiety, insomnia.   Vitals:  Blood pressure (!) 148/75, pulse 84, temperature 98.5 F (36.9 C), temperature source Oral, resp. rate 18,  height 5' 6"  (1.676 m), weight 159 lb 9 oz (72.4 kg), SpO2 97 %.  Physical Exam: WD in NAD Neck  Supple NROM, without any enlargements.  Lymph Node Survey No cervical supraclavicular or inguinal adenopathy Cardiovascular  Pulse normal rate, regularity and rhythm. S1 and S2 normal.  Lungs  Clear to auscultation bilateraly, without wheezes/crackles/rhonchi. Good air movement.  Skin  No rash/lesions/breakdown  Psychiatry  Alert and oriented to person, place, and time  Abdomen  Normoactive bowel sounds, abdomen soft, non distended, non-tender and thin without evidence of hernia.  Incision well healed. No palpable masses. Back No CVA tenderness Genito Urinary  Vaginal cuff normal with no lesions. In tact vagina.No bleeding. Surgically absent uterus and cervix. Extremities  No bilateral cyanosis, clubbing or edema.   Thereasa Solo, MD   04/04/2018, 1:55 PM

## 2018-04-04 NOTE — Patient Instructions (Signed)
Please notify Dr Denman George at phone number [phone] if you notice vaginal bleeding, new pelvic or abdominal pains, bloating, feeling full easy, or a change in bladder or bowel function.   Please continue to see Dr Walden Field as scheduled and continue your Niraparib as instructed.  Dr Denman George will see you back for surveillance in October, 2019.

## 2018-04-10 ENCOUNTER — Ambulatory Visit (HOSPITAL_COMMUNITY): Payer: Medicare Other | Admitting: Internal Medicine

## 2018-04-10 ENCOUNTER — Other Ambulatory Visit (HOSPITAL_COMMUNITY): Payer: Medicare Other

## 2018-04-12 ENCOUNTER — Ambulatory Visit (HOSPITAL_COMMUNITY)
Admission: RE | Admit: 2018-04-12 | Discharge: 2018-04-12 | Disposition: A | Discharge status: Home / Self Care | Payer: Medicare Other | Source: Ambulatory Visit | Attending: Internal Medicine | Admitting: Internal Medicine

## 2018-04-12 DIAGNOSIS — C562 Malignant neoplasm of left ovary: Secondary | ICD-10-CM

## 2018-04-12 DIAGNOSIS — M2420 Disorder of ligament, unspecified site: Secondary | ICD-10-CM | POA: Diagnosis not present

## 2018-04-12 DIAGNOSIS — I700 Atherosclerosis of aorta: Secondary | ICD-10-CM | POA: Diagnosis not present

## 2018-04-12 MED ORDER — IOPAMIDOL (ISOVUE-300) INJECTION 61%
100.0000 mL | Freq: Once | INTRAVENOUS | Status: AC | PRN
  Administered 2018-04-12: 100 mL via INTRAVENOUS

## 2018-04-16 ENCOUNTER — Encounter (HOSPITAL_COMMUNITY): Payer: Self-pay | Admitting: Internal Medicine

## 2018-04-16 ENCOUNTER — Other Ambulatory Visit: Payer: Self-pay

## 2018-04-16 ENCOUNTER — Inpatient Hospital Stay (HOSPITAL_BASED_OUTPATIENT_CLINIC_OR_DEPARTMENT_OTHER): Payer: Medicare Other | Admitting: Internal Medicine

## 2018-04-16 DIAGNOSIS — G4700 Insomnia, unspecified: Secondary | ICD-10-CM

## 2018-04-16 DIAGNOSIS — C569 Malignant neoplasm of unspecified ovary: Secondary | ICD-10-CM | POA: Diagnosis not present

## 2018-04-16 DIAGNOSIS — Z9221 Personal history of antineoplastic chemotherapy: Secondary | ICD-10-CM

## 2018-04-16 DIAGNOSIS — Z90722 Acquired absence of ovaries, bilateral: Secondary | ICD-10-CM

## 2018-04-16 DIAGNOSIS — C786 Secondary malignant neoplasm of retroperitoneum and peritoneum: Secondary | ICD-10-CM

## 2018-04-16 DIAGNOSIS — C7800 Secondary malignant neoplasm of unspecified lung: Secondary | ICD-10-CM

## 2018-04-16 DIAGNOSIS — Z79899 Other long term (current) drug therapy: Secondary | ICD-10-CM

## 2018-04-16 DIAGNOSIS — Z90710 Acquired absence of both cervix and uterus: Secondary | ICD-10-CM

## 2018-04-16 MED ORDER — PROCHLORPERAZINE MALEATE 10 MG PO TABS: 10.0000 mg | ORAL_TABLET | Freq: Four times a day (QID) | ORAL | 3 refills | Status: AC | PRN

## 2018-04-16 MED ORDER — TRAMADOL HCL 50 MG PO TABS: 50.0000 mg | ORAL_TABLET | Freq: Four times a day (QID) | ORAL | 1 refills | Status: DC | PRN

## 2018-04-16 NOTE — Patient Instructions (Signed)
[facility] at [facility] Discharge Instructions     Thank you for choosing [facility] at [facility] to provide your oncology and hematology care.  To afford each patient quality time with our provider, please arrive at least 15 minutes before your scheduled appointment time.   If you have a lab appointment with the Cancer Center please come in thru the  Main Entrance and check in at the main information desk  You need to re-schedule your appointment should you arrive 10 or more minutes late.  We strive to give you quality time with our providers, and arriving late affects you and other patients whose appointments are after yours.  Also, if you no show three or more times for appointments you may be dismissed from the clinic at the providers discretion.     Again, thank you for choosing [facility].  Our hope is that these requests will decrease the amount of time that you wait before being seen by our physicians.       _____________________________________________________________  Should you have questions after your visit to [facility], please contact our office at [phone] between the hours of 8:00 a.m. and 4:30 p.m.  Voicemails left after 4:00 p.m. will not be returned until the following business day.  For prescription refill requests, have your pharmacy contact our office and allow 72 hours.    Cancer Center Support Programs:   > Cancer Support Group  2nd Tuesday of the month 1pm-2pm, Journey Room    

## 2018-04-16 NOTE — Progress Notes (Signed)
Patient is taking Zejula and has not missed any doses and reports occasional nausea.

## 2018-04-16 NOTE — Progress Notes (Signed)
Diagnosis Malignant neoplasm of ovary, unspecified laterality ([place]) - Plan: prochlorperazine (COMPAZINE) 10 MG tablet, CBC with Differential/Platelet, Comprehensive metabolic panel, Lactate dehydrogenase, CA 125, CT CHEST W CONTRAST, CT Abdomen Pelvis W Contrast  Staging Cancer Staging Ovarian cancer [facility]) Staging form: Ovary, AJCC 7th Edition - Clinical stage from 04/27/2015: FIGO Stage IVB, calculated as Stage IV (T3c, N1, M1) - Signed by Baird Cancer, PA-C on 09/16/2015 - Pathologic stage from 05/08/2015: FIGO Stage IVB, calculated as Stage IV (TX, N1, M1) - Signed by Everitt Amber, MD on 05/08/2015 - Pathologic stage from 07/28/2015: FIGO Stage IV (T3c, NX, M1) - Signed by Baird Cancer, PA-C on 09/16/2015   Assessment and Plan:  1.  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 CT C/A/P11/12/18 demonstrated progressive disease with increase in size of previous LUQ peritoneal nodule, new left adrenal nodule and new perirectal nodule.  Pt was seen by Dr. Denman George on 08/14/17 and she recommended salvage chemotherapy with platinum and taxane for at least 6 cycles followed by PARP inhibitor maintenance with Niraparib.   CT chest, abdomen and pelvis was done on 12/20/2017 showed response to therapy.   Pt completed 6 cycles of Carboplatin and Taxol and was last treated on 12/05/2017.     Pt is on Niraparib 300 mg once daily  for Parp inhibitor maintenance.   She is tolerating therapy other than minimal nausea.    Pt is here to go over scan that was done 04/12/2018.  CT CAP reviewed and shows  IMPRESSION: 1. No new or progressive metastatic disease. 2. Gastrosplenic ligament nodule noted on the prior study has further decreased, not 5 mm in greatest transverse dimension. 3. No acute findings. 4. Aortic atherosclerosis.  Pt has been seen by Dr. Denman George in 03/2018 and is planned for follow-up in 06/2018.  Pt should continue  Niraparib and will remain on  therapy until 24 months disease free.  She began treatment 12/30/2017.    Labs done 03/26/2018 reviewed with pt and showed WBC 4.7 HB 9.9 plts 319,000.  Chemistries WNL.  CA 125 6.6.  Pt will be set up for repeat imaging with CT CAP  in 07/2018 with repeat labs and will follow-up at that time to go over results.   2.  Nausea.  She reports this is minimal.  Pt is requesting Rx for Compazine.  Compazine Rx sent to pharmacy.    3.  Pain.  Pt reports mild discomfort.  She is requesting refills on Tramadol.  Rx written.    4.  Hypertension.  BP 154/75.  Follow-up with PCP.   5.  B12 deficiency.  Hemoglobin 9.9 on recent labs.  B12, MMA and folate WNL. MCV elevated at 109.  Continue B12.   6.  RI.  Cr stable at 1.26.  Will repeat labs in 07/2018.  7.  Health maintenance.  Mammogram screening and GI evaluation as recommended.    8.  Insomnia.  Option for Melatonin discussed or Zzzquil with Melatonin.    Current Status: Patient seen today for follow-up.  She is here to go over labs and scans.  She remains on Niraparib.  She reports minimal nausea.  She needs refills on Tramadol and Compazine.  She is wanting something for sleep.  She denies any bloating or abdominal pain.   Ovarian cancer ([place])   04/25/2015 - 04/28/2015 Hospital Admission    Nausea/diarrhea.  Oncology consult completed on 04/27/2015.      04/25/2015 Tumor Marker  CA 125- 3902.      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      04/27/2015 Procedure    US Paracentesis- A total of approximately 3700 mL of amber colored fluid was removed. A fluid sample was sent for laboratory analysis.      04/27/2015 Imaging    CT Chest- Mild left supraclavicular lymphadenopathy and moderate mediastinal lymphadenopathy involving the prevascular, subcarinal and bilateral pericardiophrenic  nodal chains, likely metastatic.      04/27/2015 Pathology Results    Diagnosis PERITONEAL/ASCITIC FLUID(SPECIMEN 1 OF 1 COLLECTED 04/27/15): MALIGNANT CELLS CONSISTENT WITH METASTATIC ADENOCARCINOMA.  The immunophenotype is most consistent with a gynecologic primary, most likely ovary.      05/08/2015 Miscellaneous    Seen by Dr. Denman George- recommending Carboplatin/Paclitaxel x 3 cycles and return visit to see her (within 1 week of administration of third cycle) to evaluate for optimal sequencing of treatment modalities.      05/13/2015 - 05/15/2015 Hospital Admission    Hospitalized for AKI      05/19/2015 - 07/01/2015 Chemotherapy    Carboplatin/Paclitaxel x 3 cycles      07/28/2015 Surgery    Exploratory laparotomy with total abdominal hysterectomy, bilateral salpingo-oophorectomy, omentectomy radical tumor debulking for ovarian cancer; by Dr. Everitt Amber.      07/28/2015 Pathology Results    Uterus +/- tubes/ovaries, neoplastic, cervix - HIGH GRADE SEROUS CARCINOMA INVOLVING LEFT OVARY, LEFT FALLOPIAN TUBE AND RIGHT FALLOPIAN TUBE. - CERVIX, ENDOMETRIUM AND MYOMETRIUM ARE FREE OF TUMOR. 2. Cul-de-sac biop...      08/26/2015 -  Chemotherapy    Carboplatin/Paclitaxel x 3 cycles      12/23/2015 Miscellaneous    Genetic counseling.  Genetic testing was normal, and did not reveal a deleterious mutation in these genes      03/07/2017 Imaging    CT C/A/P IMPRESSION: Chest Impression:  No evidence of metastatic disease in thorax.  Abdomen / Pelvis Impression:  1. Interval increase in size of solitary nodular peritoneal implant in the LEFT upper quadrant . 2. No additional evidence of peritoneal nodularity. 3. No ascites.      07/31/2017 Progression    CT C/A/P: IMPRESSION: 1. Left upper quadrant nodule continues to progress, now measuring 20 x 26 mm. 2. New nodule identified between the in adrenal gland, concerning for metastatic deposit. 3. Interval development of tiny nodules  in the left lower quadrant mesenteric, concerning for metastases. 4. No ascites. 5. Status post total abdominal hysterectomy and omentectomy.        Problem List Patient Active Problem List   Diagnosis Date Noted  . 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 Genetic testing [Z13.79] 12/15/2015  . Ileus, postoperative ([place]) [K91.89, K56.7] 08/01/2015  . Acute renal failure ([place]) [N17.9] 05/13/2015  . Malnutrition of moderate degree ([place]) [E44.0] 04/28/2015  . Renal failure [N19] 04/25/2015  . Nausea without vomiting [R11.0] 04/25/2015  . Ascites, malignant [R18.0] 04/25/2015  . Ovarian cancer ([place]) [C56.9] 04/25/2015  . Omental mass [K66.8] 04/25/2015  . Acute kidney injury ([place]) [N17.9] 04/25/2015  . UTI (lower urinary tract infection) [N39.0] 04/25/2015  . Normocytic anemia [D64.9] 04/25/2015  . 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 Schizophrenia, unspecified type ([place]) [F20.9] 04/25/2015  . Diarrhea [R19.7] 04/25/2015    Past Medical History Past Medical History:  Diagnosis  Date  . Arthritis   . B12 deficiency   . Cataract   . History of blood transfusion   . History of chemotherapy   . Hypertension   . Iron deficiency anemia   . Mixed hyperlipidemia   . Ovarian cancer ([name])   . 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 Schizophrenia ([place])   . Ulcerative colitis ([place])   . Vitamin D deficiency     Past Surgical History Past Surgical History:  Procedure Laterality Date  . ABDOMINAL HYSTERECTOMY N/A 07/28/2015   Procedure: TOTAL HYSTERECTOMY ABDOMINAL BILATERAL SALPINGO OOPHRORECTOMY;  Surgeon: Everitt Amber, MD;  Location: [facility];  Service: Gynecology;  Laterality: N/A;  . DEBULKING N/A 07/28/2015   Procedure: DEBULKING;  Surgeon: Everitt Amber, MD;  Location: [facility];  Service: Gynecology;  Laterality: N/A;  . LAPAROTOMY N/A 07/28/2015   Procedure: EXPLORATORY LAPAROTOMY;  Surgeon: Everitt Amber, MD;  Location: [facility];  Service:  Gynecology;  Laterality: N/A;  . OMENTECTOMY N/A 07/28/2015   Procedure: OMENTECTOMY ;  Surgeon: Everitt Amber, MD;  Location: [facility];  Service: Gynecology;  Laterality: N/A;  . PARACENTESIS  04/27/15  . PORTACATH PLACEMENT Right 04/2015  . REPLACEMENT TOTAL KNEE     right knee in 2003    Family History Family History  Problem Relation Age of Onset  . Prostate cancer Brother   . Cancer Paternal Uncle        1 uncle with cancer NOS  . Lung cancer Maternal Grandmother   . Hypertension Maternal Grandfather   . Congestive Heart Failure Mother   . Seizures Sister      Social History  reports that she has never smoked. She has never used smokeless tobacco. She reports that she does not drink alcohol or use drugs.  Medications  Current Outpatient Medications:  .  acetaminophen (TYLENOL) 500 MG tablet, Take 2 tablets (1,000 mg total) by mouth every 12 (twelve) hours., Disp: 30 tablet, Rfl: 0 .  amLODipine (NORVASC) 10 MG tablet, Take 10 mg by mouth every evening. , Disp: , Rfl:  .  aspirin EC 81 MG tablet, Take 81 mg by mouth daily., Disp: , Rfl:  .  Cholecalciferol (VITAMIN D-3) 1000 UNITS CAPS, Take 1,000 Units by mouth daily. , Disp: , Rfl:  .  docusate sodium (COLACE) 100 MG capsule, Take 100 mg by mouth 2 (two) times daily., Disp: , Rfl:  .  folic acid (FOLVITE) 1 MG tablet, Take 1 mg by mouth daily., Disp: , Rfl:  .  lidocaine-prilocaine (EMLA) cream, Apply to affected area once, Disp: 30 g, Rfl: 3 .  lovastatin (MEVACOR) 10 MG tablet, Take 10 mg by mouth at bedtime., Disp: , Rfl:  .  metoprolol tartrate (LOPRESSOR) 25 MG tablet, Take 1 tablet (25 mg total) by mouth 2 (two) times daily., Disp: 60 tablet, Rfl: 6 .  mirtazapine (REMERON) 15 MG tablet, Take 7.5 mg by mouth at bedtime., Disp: , Rfl:  .  Multiple Vitamin (MULTIVITAMIN WITH MINERALS) TABS tablet, Take 1 tablet by mouth daily., Disp: , Rfl:  .  Niraparib Tosylate (ZEJULA) 100 MG CAPS, Take 300 mg by mouth daily., Disp: 90  capsule, Rfl: 2 .  ondansetron (ZOFRAN) 8 MG tablet, Take 1 tablet (8 mg total) by mouth 2 (two) times daily as needed for refractory nausea / vomiting. Start on day 3 after chemo., Disp: 30 tablet, Rfl: 1 .  prochlorperazine (COMPAZINE) 10 MG tablet, Take 1 tablet (10 mg total) by mouth every 6 (six) hours  as needed (Nausea or vomiting)., Disp: 60 tablet, Rfl: 3 .  QUEtiapine (SEROQUEL XR) 300 MG 24 hr tablet, Take 300 mg by mouth at bedtime., Disp: , Rfl:  .  sulfaSALAzine (AZULFIDINE) 500 MG tablet, Take 500 mg by mouth 3 (three) times daily., Disp: , Rfl:  .  traMADol (ULTRAM) 50 MG tablet, Take 1 tablet (50 mg total) by mouth every 6 (six) hours as needed., Disp: 90 tablet, Rfl: 1 .  vitamin B-12 (CYANOCOBALAMIN) 1000 MCG tablet, Take 1,000 mcg by mouth daily., Disp: , Rfl:   Allergies Patient has no known allergies.  Review of Systems Review of Systems - Oncology ROS negative other than nausea and mild pain.  Pt also reports insomnia.     Physical Exam  Vitals Wt Readings from Last 3 Encounters:  04/16/18 159 lb 9.6 oz (72.4 kg)  04/04/18 159 lb 9 oz (72.4 kg)  01/23/18 157 lb 9.6 oz (71.5 kg)   Temp Readings from Last 3 Encounters:  04/16/18 98.2 F (36.8 C) (Oral)  04/04/18 98.5 F (36.9 C) (Oral)  03/26/18 98 F (36.7 C) (Oral)   BP Readings from Last 3 Encounters:  04/16/18 (!) 154/75  04/04/18 (!) 148/75  03/26/18 (!) 147/67   Pulse Readings from Last 3 Encounters:  04/16/18 84  04/04/18 84  03/26/18 79   Constitutional: Well-developed, well-nourished, and in no distress.   HENT: Head: Normocephalic and atraumatic.  Mouth/Throat: No oropharyngeal exudate. Mucosa moist. Eyes: Pupils are equal, round, and reactive to light. Conjunctivae are normal. No scleral icterus.  Neck: Normal range of motion. Neck supple. No JVD present.  Cardiovascular: Normal rate, regular rhythm and normal heart sounds.  Exam reveals no gallop and no friction rub.   No murmur  heard. Pulmonary/Chest: Effort normal and breath sounds normal. No respiratory distress. No wheezes.No rales.  Abdominal: Soft. Bowel sounds are normal. No distension. There is no tenderness. There is no guarding.  Musculoskeletal: No edema or tenderness.  Lymphadenopathy: No cervical, axillary or supraclavicular adenopathy.  Neurological: Alert and oriented to person, place, and time. No cranial nerve deficit.  Skin: Skin is warm and dry. No rash noted. No erythema. No pallor.  Psychiatric: Affect and judgment normal.   Labs No visits with results within 3 Day(s) from this visit.  Latest known visit with results is:  Infusion on 03/26/2018  Component Date Value Ref Range Status  . Cancer Antigen (CA) 125 03/26/2018 6.6  0.0 - 38.1 U/mL Final   Comment: (NOTE) Roche Diagnostics Electrochemiluminescence Immunoassay (ECLIA) Values obtained with different assay methods or kits cannot be used interchangeably.  Results cannot be interpreted as absolute evidence of the presence or absence of malignant disease. Performed At: [facility] [place], [place] [id number] Rush Farmer MD [id number] Performed at [facility], [address] [place], [place] [address]   . Folate 03/26/2018 100.0  >5.9 ng/mL Final   Comment: RESULTS CONFIRMED BY MANUAL DILUTION Performed at [facility], [place] [address] [place], [place] [address]   . Vitamin B-12 03/26/2018 1,393* 180 - 914 pg/mL Final   Performed at [facility], [place] [address] [place], [place] [address]  . Methylmalonic Acid, Quantitative 03/26/2018 232  0 - 378 nmol/L Final  . Disclaimer: 03/26/2018 Comment   Final   Comment: (NOTE) This test was developed and its performance characteristics determined by [facility]. It has not been cleared or approved by the Food and Drug Administration. Performed At: [facility]  [place], [place] [id number] Rush Farmer MD [id number]   .  LDH 03/26/2018 209* 98 - 192 U/L Final   Performed at [facility], [address], [place], [place] [address]  . Sodium 03/26/2018 140  135 - 145 mmol/L Final  . Potassium 03/26/2018 3.7  3.5 - 5.1 mmol/L Final  . Chloride 03/26/2018 104  98 - 111 mmol/L Final   Please note change in reference range.  . CO2 03/26/2018 29  22 - 32 mmol/L Final  . Glucose, Bld 03/26/2018 88  70 - 99 mg/dL Final   Please note change in reference range.  . BUN 03/26/2018 13  8 - 23 mg/dL Final   Please note change in reference range.  . Creatinine, Ser 03/26/2018 1.26* 0.44 - 1.00 mg/dL Final  . Calcium 03/26/2018 9.2  8.9 - 10.3 mg/dL Final  . Total Protein 03/26/2018 7.5  6.5 - 8.1 g/dL Final  . Albumin 03/26/2018 4.3  3.5 - 5.0 g/dL Final  . AST 03/26/2018 35  15 - 41 U/L Final  . ALT 03/26/2018 32  0 - 44 U/L Final   Please note change in reference range.  . Alkaline Phosphatase 03/26/2018 82  38 - 126 U/L Final  . Total Bilirubin 03/26/2018 0.5  0.3 - 1.2 mg/dL Final  . GFR calc non Af Amer 03/26/2018 41* >60 mL/min Final  . GFR calc Af Amer 03/26/2018 47* >60 mL/min Final   Comment: (NOTE) The eGFR has been calculated using the CKD EPI equation. This calculation has not been validated in all clinical situations. eGFR's persistently <60 mL/min signify possible Chronic Kidney Disease.   Georgiann Hahn gap 03/26/2018 7  5 - 15 Final   Performed at [facility], [address] [place], [place] [address]  . WBC 03/26/2018 4.7  4.0 - 10.5 K/uL Final  . RBC 03/26/2018 2.76* 3.87 - 5.11 MIL/uL Final  . Hemoglobin 03/26/2018 9.9* 12.0 - 15.0 g/dL Final  . HCT 03/26/2018 30.3* 36.0 - 46.0 % Final  . MCV 03/26/2018 109.8* 78.0 - 100.0 fL Final  . MCH 03/26/2018 35.9* 26.0 - 34.0 pg Final  . MCHC 03/26/2018 32.7  30.0 - 36.0 g/dL Final  . RDW 03/26/2018 13.5  11.5 - 15.5 % Final  . Platelets 03/26/2018 319  150 - 400 K/uL Final  . Neutrophils Relative % 03/26/2018 59  % Final  . Neutro Abs 03/26/2018 2.8  1.7  - 7.7 K/uL Final  . Lymphocytes Relative 03/26/2018 20  % Final  . Lymphs Abs 03/26/2018 0.9  0.7 - 4.0 K/uL Final  . Monocytes Relative 03/26/2018 16  % Final  . Monocytes Absolute 03/26/2018 0.7  0.1 - 1.0 K/uL Final  . Eosinophils Relative 03/26/2018 4  % Final  . Eosinophils Absolute 03/26/2018 0.2  0.0 - 0.7 K/uL Final  . Basophils Relative 03/26/2018 1  % Final  . Basophils Absolute 03/26/2018 0.0  0.0 - 0.1 K/uL Final   Performed at [facility], [address] [place],  [address]     Pathology Orders Placed This Encounter  Procedures  . CT CHEST W CONTRAST    Standing Status:   Future    Standing Expiration Date:   04/16/2019    Order Specific Question:   If indicated for the ordered procedure, I authorize the administration of contrast media per Radiology protocol    Answer:   Yes    Order Specific Question:   Preferred imaging location?    Answer:  [facility]    Order Specific Question:   Radiology Contrast Protocol - do NOT remove file path    Answer:   \\[email or web address]  . CT Abdomen Pelvis W Contrast    Standing Status:   Future    Standing Expiration Date:   04/16/2019    Order Specific Question:   If indicated for the ordered procedure, I authorize the administration of contrast media per Radiology protocol    Answer:   Yes    Order Specific Question:   Preferred imaging location?    Answer:   [facility]    Order Specific Question:   Is Oral Contrast requested for this exam?    Answer:   Yes, Per Radiology protocol    Order Specific Question:   Radiology Contrast Protocol - do NOT remove file path    Answer:   \\[email or web address]  . CBC with Differential/Platelet    Standing Status:   Future    Standing Expiration Date:   04/17/2019  . Comprehensive metabolic panel    Standing Status:   Future    Standing Expiration Date:   04/17/2019  . Lactate dehydrogenase    Standing Status:   Future     Standing Expiration Date:   04/17/2019  . CA 125    Standing Status:   Future    Standing Expiration Date:   04/17/2019       Zoila Shutter MD

## 2018-04-18 ENCOUNTER — Other Ambulatory Visit (HOSPITAL_COMMUNITY): Payer: Medicare Other

## 2018-06-26 ENCOUNTER — Other Ambulatory Visit (HOSPITAL_COMMUNITY): Payer: Self-pay | Admitting: *Deleted

## 2018-06-26 ENCOUNTER — Telehealth (HOSPITAL_COMMUNITY): Payer: Self-pay | Admitting: *Deleted

## 2018-06-26 MED ORDER — NIRAPARIB TOSYLATE 100 MG PO CAPS: 300.0000 mg | ORAL_CAPSULE | Freq: Every day | ORAL | 2 refills | Status: DC

## 2018-06-26 NOTE — Telephone Encounter (Signed)
Chart reviewed, Zejula refilled.

## 2018-06-28 ENCOUNTER — Telehealth (HOSPITAL_COMMUNITY): Payer: Self-pay | Admitting: Internal Medicine

## 2018-06-28 NOTE — Telephone Encounter (Signed)
FAXED ZEJULA RX TO [facility]

## 2018-07-09 ENCOUNTER — Encounter: Payer: Self-pay | Admitting: Gynecologic Oncology

## 2018-07-09 ENCOUNTER — Inpatient Hospital Stay: Payer: Medicare Other | Attending: Gynecologic Oncology | Admitting: Gynecologic Oncology

## 2018-07-09 ENCOUNTER — Inpatient Hospital Stay: Payer: Medicare Other

## 2018-07-09 VITALS — BP 159/74 | HR 76 | Temp 98.0°F | Resp 20 | Ht 66.0 in | Wt 161.0 lb

## 2018-07-09 DIAGNOSIS — Z9221 Personal history of antineoplastic chemotherapy: Secondary | ICD-10-CM | POA: Diagnosis not present

## 2018-07-09 DIAGNOSIS — Z90710 Acquired absence of both cervix and uterus: Secondary | ICD-10-CM | POA: Diagnosis not present

## 2018-07-09 DIAGNOSIS — Z90722 Acquired absence of ovaries, bilateral: Secondary | ICD-10-CM | POA: Insufficient documentation

## 2018-07-09 DIAGNOSIS — C569 Malignant neoplasm of unspecified ovary: Secondary | ICD-10-CM | POA: Insufficient documentation

## 2018-07-09 LAB — CBC WITH DIFFERENTIAL/PLATELET
Abs Immature Granulocytes: 0.01 10*3/uL (ref 0.00–0.07)
BASOS PCT: 1 %
Basophils Absolute: 0 10*3/uL (ref 0.0–0.1)
EOS ABS: 0.2 10*3/uL (ref 0.0–0.5)
EOS PCT: 4 %
HEMATOCRIT: 32.9 % — AB (ref 36.0–46.0)
Hemoglobin: 10.8 g/dL — ABNORMAL LOW (ref 12.0–15.0)
IMMATURE GRANULOCYTES: 0 %
LYMPHS ABS: 1.2 10*3/uL (ref 0.7–4.0)
Lymphocytes Relative: 26 %
MCH: 36.2 pg — AB (ref 26.0–34.0)
MCHC: 32.8 g/dL (ref 30.0–36.0)
MCV: 110.4 fL — AB (ref 80.0–100.0)
MONO ABS: 0.7 10*3/uL (ref 0.1–1.0)
MONOS PCT: 15 %
Neutro Abs: 2.6 10*3/uL (ref 1.7–7.7)
Neutrophils Relative %: 54 %
Platelets: 336 10*3/uL (ref 150–400)
RBC: 2.98 MIL/uL — ABNORMAL LOW (ref 3.87–5.11)
RDW: 13 % (ref 11.5–15.5)
WBC: 4.7 10*3/uL (ref 4.0–10.5)
nRBC: 0 % (ref 0.0–0.2)

## 2018-07-09 LAB — COMPREHENSIVE METABOLIC PANEL
ALBUMIN: 4.5 g/dL (ref 3.5–5.0)
ALK PHOS: 102 U/L (ref 38–126)
ALT: 26 U/L (ref 0–44)
AST: 29 U/L (ref 15–41)
Anion gap: 8 (ref 5–15)
BILIRUBIN TOTAL: 0.4 mg/dL (ref 0.3–1.2)
BUN: 14 mg/dL (ref 8–23)
CALCIUM: 9.9 mg/dL (ref 8.9–10.3)
CO2: 30 mmol/L (ref 22–32)
CREATININE: 1.28 mg/dL — AB (ref 0.44–1.00)
Chloride: 102 mmol/L (ref 98–111)
GFR calc Af Amer: 47 mL/min — ABNORMAL LOW (ref >60)
GFR calc non Af Amer: 40 mL/min — ABNORMAL LOW (ref >60)
GLUCOSE: 94 mg/dL (ref 70–99)
Potassium: 4.5 mmol/L (ref 3.5–5.1)
SODIUM: 140 mmol/L (ref 135–145)
TOTAL PROTEIN: 8.1 g/dL (ref 6.5–8.1)

## 2018-07-09 LAB — LACTATE DEHYDROGENASE: LDH: 245 U/L — ABNORMAL HIGH (ref 98–192)

## 2018-07-09 NOTE — Patient Instructions (Signed)
Please notify Dr Denman George at phone number [phone] if you notice vaginal bleeding, new pelvic or abdominal pains, bloating, feeling full easy, or a change in bladder or bowel function.   Please follow-up with the doctors at [facility] as scheduled for lab checks and prescription of your medication.  Dr Denman George will see you back in March, 2020. Please call her office at [phone] in January, 2020 to schedule this.

## 2018-07-09 NOTE — Progress Notes (Signed)
Followup Note: Gyn-Onc  Latasha [name] 77 y.o. 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 Malignant neoplasm of ovary, unspecified laterality Latasha [name])    Assessment/Plan:  Latasha [name]  is a 77 y.o.  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 BRCA negative on testing.  S/p salvage 2nd line chemotherapy with carb/taxol completed 11/30/17. Now on Niraparib since March, 2019.  No measurable disease. Will check CA 125 today.  Tolerating maintenance PARPi. Recommend continuing this until 24 months (March, 2021) disease free at which time it can be suspended.  I will see [name] back in March, 2020 for a pelvic exam as part of surveillance.   HPI: Latasha [name] is a very pleasant 77 year old G1 who is seen in consultation at the request of Dr Whitney Muse for (clinical) stage IVB ovarian cancer. The patient developed symptoms of progressive abdominal fullness and discomfort over the course of several months. In August, 2016 she presented to the [facility] where imaging of the abdomen and pelvis was obtained for diarrhea, nausea and emesis. Imaging on 04/25/15 revealed large volume ascites, carcinomatosis and peritoneal masses measuring up to 5.1cm. The patient underwent paracentesis with cytology on 04/27/15 which revealed metastatic adenocarcinoma consistent on immunostains with gyn primary.  CT of the chest on 04/27/15 revealed mild left supraclavicular lymphadenopathy, and mediastinal lymphadenopathy consistent with metastatic disease. There were <43m pulmonary nodules also seen which were indeterminant. There were trace pleural effusions seen.   CA 125 on 04/25/15: 3902.  She then went on to receive 3 cycles of paclitaxel and carboplatin chemotherapy with Dr PWhitney Muse Cycle 3 was on 07/01/15.  CA 125 on 07/01/15 was 249.  CT scan on 07/20/15 showed: Previously noted ascites has resolved.  No pneumoperitoneum. Many of the previously noted peritoneal implants are no longer confidently identified on today's examination. There are few smaller implants noted, the largest of which is in th  left side of the abdomen anteriorly measuring 9 x 11 mm. There is also a 10 x 7 mm lesion superficial to the splenic flexure of the colon which is substantially smaller than the prior study (previously 2.8 x 1.7 cm).  There was resolution of the chest adenopathy.  She has tolerated chemotherapy well with a good appetites and good general function.  On 07/31/15 she was taken to the OR for an ex lap, TAH, BSO, omentectomy, radical tumor debulking. Intraoperative findings included: Excellent response to neoadjuvant chemotherapy. Small volume plaques/nodules in left and right posterior cul de sac behind uterus, tumor on left ovary (3cm), multiple 2cm nodules in omentum. No other peritoneal disease. Diaphragm normal.  She had an optimal cytoreduction (R0) with no gross visible disease remaining.  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  Postoperatively she did very well with no postoperative complications.  She went on to receive 3 additional cycles of carboplatin and paclitaxel completed April, 2017. She tolerated therapy very well.  Post treatment scans were unremarkable and showed no residual disease.  Her CA 125 was monitored at 3 monthly intervals. In January, 2018 it was normal at 17. On April, 20th ,2018 it had increased to 32. On June 15th, 2018 it had increased again to 47. CT chest/abdo/pelvis on 03/07/17 showed solitary pertoneal nodule in the gastrosplenic ligament measuring 114mx 1646mincreased from a prior scan where it was 1cm). No additional nodularity is  noted nor ascites.  She was offered secondary cytoreduction surgery followed by chemotherapy, however she declined this and opted for expectant management because she felt good.  Interval Hx:   On 07/31/17 repeat CT abdo/pelvis was performed. It showed progression of peritoneal disease with an enlargement of a peri-splenic lesion, a new nodule adjacent to the adrenal gland and a new left lower quadrant peritoneal metastases.  She agreed to commend salvage 2nd line chemotherapy with carboplatin and paclitaxel in December, 2018 due to the onset of symptomatic recurrence. She completed 6 cycles of this on 11/30/17 and was then changed to Niraparib maintenance 363m daily starting in March, 2019.  CA 125 was normal at 9 on 03/26/18.  CT scan abd/pelvis in July, 2019 was normal with no evidence of recurrent/progressive disease.   She continues to feel well with no abdominal symptoms and an excellent performance status (ECOG 0).  She denies thrombocytopenia. She has mild occasional nausea.   Current Meds:  Outpatient Encounter Medications as of 07/09/2018  Medication Sig  . acetaminophen (TYLENOL) 500 MG tablet Take 2 tablets (1,000 mg total) by mouth every 12 (twelve) hours.  .Marland KitchenamLODipine (NORVASC) 10 MG tablet Take 10 mg by mouth every evening.   .Marland Kitchenaspirin EC 81 MG tablet Take 81 mg by mouth daily.  . Cholecalciferol (VITAMIN D-3) 1000 UNITS CAPS Take 1,000 Units by mouth daily.   .Marland Kitchendocusate sodium (COLACE) 100 MG capsule Take 100 mg by mouth 2 (two) times daily.  . folic acid (FOLVITE) 1 MG tablet Take 1 mg by mouth daily.  .Marland Kitchenlidocaine-prilocaine (EMLA) cream Apply to affected area once  . lovastatin (MEVACOR) 10 MG tablet Take 10 mg by mouth at bedtime.  . metoprolol tartrate (LOPRESSOR) 25 MG tablet Take 1 tablet (25 mg total) by mouth 2 (two) times daily.  . mirtazapine (REMERON) 15 MG tablet Take 7.5 mg by mouth at bedtime.  . Multiple Vitamin (MULTIVITAMIN WITH MINERALS) TABS tablet Take 1 tablet by mouth daily.  .Alanda SlimTosylate (ZEJULA) 100 MG CAPS Take 300 mg by mouth daily.  . ondansetron (ZOFRAN) 8 MG tablet Take 1 tablet (8 mg total) by mouth 2 (two) times daily as  needed for refractory nausea / vomiting. Start on day 3 after chemo.  . prochlorperazine (COMPAZINE) 10 MG tablet Take 1 tablet (10 mg total) by mouth every 6 (six) hours as needed (Nausea or vomiting).  . QUEtiapine (SEROQUEL XR) 300 MG 24 hr tablet Take 300 mg by mouth at bedtime.  . sulfaSALAzine (AZULFIDINE) 500 MG tablet Take 500 mg by mouth 3 (three) times daily.  . traMADol (ULTRAM) 50 MG tablet Take 1 tablet (50 mg total) by mouth every 6 (six) hours as needed.  . vitamin B-12 (CYANOCOBALAMIN) 1000 MCG tablet Take 1,000 mcg by mouth daily.   No facility-administered encounter medications on file as of 07/09/2018.     Allergy: No Known Allergies  Social Hx:   Social History   Socioeconomic History  . Marital status: Single    Spouse name: Not on file  . Number of children: 1  . Years of education: Not on file  . Highest education level: Not on file  Occupational History  . Not on file  Social Needs  . Financial resource strain: Not on file  . Food insecurity:    Worry: Not on file    Inability: Not on file  . Transportation needs:    Medical: Not on file    Non-medical: Not on  file  Tobacco Use  . Smoking status: Never Smoker  . Smokeless tobacco: Never Used  Substance and Sexual Activity  . Alcohol use: No  . Drug use: No  . Sexual activity: Not Currently  Lifestyle  . Physical activity:    Days per week: Not on file    Minutes per session: Not on file  . Stress: Not on file  Relationships  . Social connections:    Talks on phone: Not on file    Gets together: Not on file    Attends religious service: Not on file    Active member of club or organization: Not on file    Attends meetings of clubs or organizations: Not on file    Relationship status: Not on file  . Intimate partner violence:    Fear of current or ex partner: Not on file    Emotionally abused: Not on file    Physically abused: Not on file    Forced sexual activity: Not on file  Other Topics  Concern  . Not on file  Social History Narrative  . Not on file    Past Surgical Hx:  Past Surgical History:  Procedure Laterality Date  . ABDOMINAL HYSTERECTOMY N/A 07/28/2015   Procedure: TOTAL HYSTERECTOMY ABDOMINAL BILATERAL SALPINGO OOPHRORECTOMY;  Surgeon: Everitt Amber, MD;  Location: [facility];  Service: Gynecology;  Laterality: N/A;  . DEBULKING N/A 07/28/2015   Procedure: DEBULKING;  Surgeon: Everitt Amber, MD;  Location: [facility];  Service: Gynecology;  Laterality: N/A;  . LAPAROTOMY N/A 07/28/2015   Procedure: EXPLORATORY LAPAROTOMY;  Surgeon: Everitt Amber, MD;  Location: [facility];  Service: Gynecology;  Laterality: N/A;  . OMENTECTOMY N/A 07/28/2015   Procedure: OMENTECTOMY ;  Surgeon: Everitt Amber, MD;  Location: [facility];  Service: Gynecology;  Laterality: N/A;  . PARACENTESIS  04/27/15  . PORTACATH PLACEMENT Right 04/2015  . REPLACEMENT TOTAL KNEE     right knee in 2003    Past Medical Hx:  Past Medical History:  Diagnosis Date  . Arthritis   . B12 deficiency   . Cataract   . History of blood transfusion   . History of chemotherapy   . Hypertension   . Iron deficiency anemia   . Mixed hyperlipidemia   . Ovarian cancer ([place])   . 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 Schizophrenia ([place])   . Ulcerative colitis ([place])   . Vitamin D deficiency     Past Gynecological History:  SVD x 1  No LMP recorded. Patient is postmenopausal.  Family Hx:  Family History  Problem Relation Age of Onset  . Prostate cancer Brother   . Cancer Paternal Uncle        1 uncle with cancer NOS  . Lung cancer Maternal Grandmother   . Hypertension Maternal Grandfather   . Congestive Heart Failure Mother   . Seizures Sister     Review of Systems:  Constitutional  Feels well,    ENT Normal appearing ears and nares bilaterally Skin/Breast  No rash, sores, jaundice, itching, dryness Cardiovascular  No chest pain, shortness of breath, or edema  Pulmonary  No cough or wheeze.  Gastro Intestinal  No nausea,  vomitting, or diarrhoea. No bright red blood per rectum, no abdominal pain, change in bowel movement, or constipation.  Genito Urinary  No frequency, urgency, dysuria, no postmenopausal bleeding Musculo Skeletal  No myalgia, arthralgia, joint swelling or pain  Neurologic  No weakness, numbness, change in gait,  Psychology  No depression,  anxiety, insomnia.   Vitals:  Blood pressure (!) 159/74, pulse 76, temperature 98 F (36.7 C), temperature source Oral, resp. rate 20, height _0  (1.676 m), weight 161 lb (73 kg), SpO2 97 %.  Physical Exam: WD in NAD Neck  Supple NROM, without any enlargements.  Lymph Node Survey No cervical supraclavicular or inguinal adenopathy Cardiovascular  Pulse normal rate, regularity and rhythm. S1 and S2 normal.  Lungs  Clear to auscultation bilateraly, without wheezes/crackles/rhonchi. Good air movement.  Skin  No rash/lesions/breakdown  Psychiatry  Alert and oriented to person, place, and time  Abdomen  Normoactive bowel sounds, abdomen soft, non distended, non-tender and thin without evidence of hernia.  Incision well healed. No palpable masses. Back No CVA tenderness Genito Urinary  Vaginal cuff normal with no lesions. In tact vagina.No bleeding. Surgically absent uterus and cervix. Extremities  No bilateral cyanosis, clubbing or edema.   Thereasa Solo, MD   07/09/2018, 2:53 PM

## 2018-07-10 ENCOUNTER — Telehealth: Payer: Self-pay | Admitting: *Deleted

## 2018-07-10 LAB — CA 125: Cancer Antigen (CA) 125: 7.1 U/mL (ref 0.0–38.1)

## 2018-07-10 NOTE — Telephone Encounter (Signed)
I called patient to inform her of her CA-125 results, per Joylene John, NP results are 7.1 and this is in normal range.  Patient verbalized understanding.

## 2018-07-19 ENCOUNTER — Inpatient Hospital Stay (HOSPITAL_COMMUNITY): Payer: Medicare Other | Attending: Internal Medicine

## 2018-07-19 DIAGNOSIS — Z23 Encounter for immunization: Secondary | ICD-10-CM | POA: Insufficient documentation

## 2018-07-19 MED ORDER — INFLUENZA VAC SPLIT HIGH-DOSE 0.5 ML IM SUSY
PREFILLED_SYRINGE | INTRAMUSCULAR | Status: AC
  Filled 2018-07-19: qty 0.5

## 2018-07-19 MED ORDER — INFLUENZA VAC SPLIT HIGH-DOSE 0.5 ML IM SUSY
0.5000 mL | PREFILLED_SYRINGE | INTRAMUSCULAR | Status: AC
  Administered 2018-07-19: 0.5 mL via INTRAMUSCULAR

## 2018-07-19 NOTE — Progress Notes (Signed)
Pt here today for Flu shot only. Given in the L deltoid. High dose. VSS. Pt tolerated well. No complaints at this time. Pt ambulated off the unit with son.

## 2018-07-19 NOTE — Patient Instructions (Signed)
[facility] at [facility]  Discharge Instructions:   _______________________________________________________________  Thank you for choosing [facility] at [facility] to provide your oncology and hematology care.  To afford each patient quality time with our providers, please arrive at least 15 minutes before your scheduled appointment.  You need to re-schedule your appointment if you arrive 10 or more minutes late.  We strive to give you quality time with our providers, and arriving late affects you and other patients whose appointments are after yours.  Also, if you no show three or more times for appointments you may be dismissed from the clinic.  Again, thank you for choosing [facility] at [facility]. Our hope is that these requests will allow you access to exceptional care and in a timely manner. _______________________________________________________________  If you have questions after your visit, please contact our office at [phone] between the hours of 8:30 a.m. and 5:00 p.m. Voicemails left after 4:30 p.m. will not be returned until the following business day. _______________________________________________________________  For prescription refill requests, have your pharmacy contact our office. _______________________________________________________________  Recommendations made by the consultant and any test results will be sent to your referring physician. _______________________________________________________________ 

## 2018-08-10 ENCOUNTER — Other Ambulatory Visit (HOSPITAL_COMMUNITY): Payer: Self-pay | Admitting: *Deleted

## 2018-08-10 DIAGNOSIS — C569 Malignant neoplasm of unspecified ovary: Secondary | ICD-10-CM

## 2018-08-13 ENCOUNTER — Inpatient Hospital Stay (HOSPITAL_COMMUNITY): Payer: Medicare Other | Attending: Hematology

## 2018-08-13 DIAGNOSIS — Z90710 Acquired absence of both cervix and uterus: Secondary | ICD-10-CM | POA: Diagnosis not present

## 2018-08-13 DIAGNOSIS — Z90722 Acquired absence of ovaries, bilateral: Secondary | ICD-10-CM | POA: Insufficient documentation

## 2018-08-13 DIAGNOSIS — C569 Malignant neoplasm of unspecified ovary: Secondary | ICD-10-CM | POA: Diagnosis not present

## 2018-08-13 DIAGNOSIS — Z9221 Personal history of antineoplastic chemotherapy: Secondary | ICD-10-CM | POA: Insufficient documentation

## 2018-08-13 LAB — CBC WITH DIFFERENTIAL/PLATELET
Abs Immature Granulocytes: 0.02 10*3/uL (ref 0.00–0.07)
BASOS PCT: 1 %
Basophils Absolute: 0.1 10*3/uL (ref 0.0–0.1)
Eosinophils Absolute: 0.2 10*3/uL (ref 0.0–0.5)
Eosinophils Relative: 4 %
HEMATOCRIT: 30.7 % — AB (ref 36.0–46.0)
Hemoglobin: 10 g/dL — ABNORMAL LOW (ref 12.0–15.0)
IMMATURE GRANULOCYTES: 0 %
LYMPHS PCT: 18 %
Lymphs Abs: 0.9 10*3/uL (ref 0.7–4.0)
MCH: 37 pg — ABNORMAL HIGH (ref 26.0–34.0)
MCHC: 32.6 g/dL (ref 30.0–36.0)
MCV: 113.7 fL — ABNORMAL HIGH (ref 80.0–100.0)
MONO ABS: 0.8 10*3/uL (ref 0.1–1.0)
MONOS PCT: 15 %
NEUTROS ABS: 3.2 10*3/uL (ref 1.7–7.7)
NEUTROS PCT: 62 %
PLATELETS: 304 10*3/uL (ref 150–400)
RBC: 2.7 MIL/uL — AB (ref 3.87–5.11)
RDW: 13.2 % (ref 11.5–15.5)
WBC: 5.1 10*3/uL (ref 4.0–10.5)
nRBC: 0 % (ref 0.0–0.2)

## 2018-08-13 LAB — COMPREHENSIVE METABOLIC PANEL
ALBUMIN: 4.4 g/dL (ref 3.5–5.0)
ALT: 27 U/L (ref 0–44)
ANION GAP: 8 (ref 5–15)
AST: 33 U/L (ref 15–41)
Alkaline Phosphatase: 79 U/L (ref 38–126)
BILIRUBIN TOTAL: 0.5 mg/dL (ref 0.3–1.2)
BUN: 14 mg/dL (ref 8–23)
CHLORIDE: 105 mmol/L (ref 98–111)
CO2: 29 mmol/L (ref 22–32)
Calcium: 9.4 mg/dL (ref 8.9–10.3)
Creatinine, Ser: 1.11 mg/dL — ABNORMAL HIGH (ref 0.44–1.00)
GFR calc Af Amer: 55 mL/min — ABNORMAL LOW (ref >60)
GFR calc non Af Amer: 47 mL/min — ABNORMAL LOW (ref >60)
GLUCOSE: 94 mg/dL (ref 70–99)
POTASSIUM: 3.7 mmol/L (ref 3.5–5.1)
SODIUM: 142 mmol/L (ref 135–145)
Total Protein: 7.6 g/dL (ref 6.5–8.1)

## 2018-08-13 LAB — LACTATE DEHYDROGENASE: LDH: 185 U/L (ref 98–192)

## 2018-08-14 ENCOUNTER — Telehealth: Payer: Self-pay

## 2018-08-14 NOTE — Telephone Encounter (Signed)
Incoming call from pt to schedule f/u with dr Denman George for March- appt given for 3-3 12:45 pm. No other needs per pt at this time.

## 2018-08-20 ENCOUNTER — Ambulatory Visit (HOSPITAL_COMMUNITY)
Admission: RE | Admit: 2018-08-20 | Discharge: 2018-08-20 | Disposition: A | Discharge status: Home / Self Care | Payer: Medicare Other | Source: Ambulatory Visit | Attending: Internal Medicine | Admitting: Internal Medicine

## 2018-08-20 DIAGNOSIS — N8189 Other female genital prolapse: Secondary | ICD-10-CM | POA: Diagnosis not present

## 2018-08-20 DIAGNOSIS — C569 Malignant neoplasm of unspecified ovary: Secondary | ICD-10-CM | POA: Diagnosis not present

## 2018-08-20 DIAGNOSIS — Z90710 Acquired absence of both cervix and uterus: Secondary | ICD-10-CM | POA: Insufficient documentation

## 2018-08-20 DIAGNOSIS — R9389 Abnormal findings on diagnostic imaging of other specified body structures: Secondary | ICD-10-CM | POA: Diagnosis not present

## 2018-08-20 DIAGNOSIS — N8110 Cystocele, unspecified: Secondary | ICD-10-CM | POA: Insufficient documentation

## 2018-08-20 DIAGNOSIS — I700 Atherosclerosis of aorta: Secondary | ICD-10-CM | POA: Diagnosis not present

## 2018-08-20 MED ORDER — IOPAMIDOL (ISOVUE-300) INJECTION 61%
100.0000 mL | Freq: Once | INTRAVENOUS | Status: AC | PRN
  Administered 2018-08-20: 100 mL via INTRAVENOUS

## 2018-08-24 ENCOUNTER — Other Ambulatory Visit: Payer: Self-pay

## 2018-08-24 ENCOUNTER — Encounter (HOSPITAL_COMMUNITY): Payer: Self-pay | Admitting: Internal Medicine

## 2018-08-24 ENCOUNTER — Inpatient Hospital Stay (HOSPITAL_COMMUNITY): Payer: Medicare Other | Attending: Internal Medicine | Admitting: Internal Medicine

## 2018-08-24 ENCOUNTER — Telehealth (HOSPITAL_COMMUNITY): Payer: Self-pay | Admitting: Pharmacist

## 2018-08-24 VITALS — BP 94/63 | HR 88 | Temp 97.6°F | Resp 20 | Wt 162.7 lb

## 2018-08-24 DIAGNOSIS — Z90710 Acquired absence of both cervix and uterus: Secondary | ICD-10-CM

## 2018-08-24 DIAGNOSIS — C786 Secondary malignant neoplasm of retroperitoneum and peritoneum: Secondary | ICD-10-CM | POA: Diagnosis not present

## 2018-08-24 DIAGNOSIS — Z79899 Other long term (current) drug therapy: Secondary | ICD-10-CM | POA: Diagnosis not present

## 2018-08-24 DIAGNOSIS — C562 Malignant neoplasm of left ovary: Secondary | ICD-10-CM | POA: Diagnosis present

## 2018-08-24 DIAGNOSIS — E538 Deficiency of other specified B group vitamins: Secondary | ICD-10-CM | POA: Diagnosis not present

## 2018-08-24 DIAGNOSIS — C7800 Secondary malignant neoplasm of unspecified lung: Secondary | ICD-10-CM

## 2018-08-24 DIAGNOSIS — Z7982 Long term (current) use of aspirin: Secondary | ICD-10-CM

## 2018-08-24 DIAGNOSIS — I10 Essential (primary) hypertension: Secondary | ICD-10-CM

## 2018-08-24 DIAGNOSIS — C569 Malignant neoplasm of unspecified ovary: Secondary | ICD-10-CM

## 2018-08-24 DIAGNOSIS — Z9221 Personal history of antineoplastic chemotherapy: Secondary | ICD-10-CM | POA: Diagnosis not present

## 2018-08-24 MED ORDER — NIRAPARIB TOSYLATE 100 MG PO CAPS: 300.0000 mg | ORAL_CAPSULE | Freq: Every day | ORAL | 3 refills | Status: DC

## 2018-08-24 NOTE — Progress Notes (Signed)
Patient taking Zejula daily and reports no side effects and no missed doses.

## 2018-08-24 NOTE — Patient Instructions (Signed)
[place] at [facility] Discharge Instructions  Today you saw Dr. Walden [facility] reviewed   Thank you for choosing [place] at [facility] to provide your oncology and hematology care.  To afford each patient quality time with our provider, please arrive at least 15 minutes before your scheduled appointment time.   If you have a lab appointment with the [facility] please come in thru the  Main Entrance and check in at the main information desk  You need to re-schedule your appointment should you arrive 10 or more minutes late.  We strive to give you quality time with our providers, and arriving late affects you and other patients whose appointments are after yours.  Also, if you no show three or more times for appointments you may be dismissed from the clinic at the providers discretion.     Again, thank you for choosing [facility].  Our hope is that these requests will decrease the amount of time that you wait before being seen by our physicians.       _____________________________________________________________  Should you have questions after your visit to [facility], please contact our office at [phone] between the hours of 8:00 a.m. and 4:30 p.m.  Voicemails left after 4:00 p.m. will not be returned until the following business day.  For prescription refill requests, have your pharmacy contact our office and allow 72 hours.    Cancer Center Support Programs:   > Cancer Support Group  2nd Tuesday of the month 1pm-2pm, Journey Room

## 2018-08-24 NOTE — Telephone Encounter (Signed)
Oral Oncology Pharmacist Encounter  Received prescription for Zejula (niraparib) for the treatment of ovarian cancer maintenance therapy following response to platinum based chemotherapy, planned duration until disease progression or unacceptable drug toxicity. This is an existing therapy for Ms. Nakajima.  CBC from 08/13/18 assessed, no relevant lab abnormalities. BP from 08/24/18 well controlled, continue to monitor. Prescription dose and frequency assessed.   Current medication list in Epic reviewed, no DDIs with niraparib identified.  Prescription has been e-scribed to the [facility] for benefits analysis and approval.  Oral Oncology Clinic will continue to follow for insurance authorization, copayment issues, initial counseling and start date.  Darl Pikes, PharmD, BCPS, [facility] Hematology/Oncology Clinical Pharmacist [facility]/AP Oral [facility] [phone]  08/24/2018 2:56 PM

## 2018-08-24 NOTE — Progress Notes (Signed)
Diagnosis Malignant neoplasm of left ovary ([place]) - Plan: CT CHEST W CONTRAST, CT ABDOMEN PELVIS W CONTRAST  Ovarian CA, unspecified laterality ([place]) - Plan: CBC with Differential/Platelet, Comprehensive metabolic panel, Lactate dehydrogenase, CA 125, CANCELED: CT CHEST W CONTRAST, CANCELED: CT ABDOMEN PELVIS W CONTRAST  Staging Cancer Staging Ovarian cancer [facility]) Staging form: Ovary, AJCC 7th Edition - Clinical stage from 04/27/2015: FIGO Stage IVB, calculated as Stage IV (T3c, N1, M1) - Signed by Baird Cancer, PA-C on 09/16/2015 - Pathologic stage from 05/08/2015: FIGO Stage IVB, calculated as Stage IV (TX, N1, M1) - Signed by Everitt Amber, MD on 05/08/2015 - Pathologic stage from 07/28/2015: FIGO Stage IV (T3c, NX, M1) - Signed by Baird Cancer, PA-C on 09/16/2015   Assessment and Plan:  1.  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 CT C/A/P 07/31/17 demonstrated progressive disease with increase in size of previous LUQ peritoneal nodule, new left adrenal nodule and new perirectal nodule.  Pt was seen by Dr. Denman George on 08/14/17 and she recommended salvage chemotherapy with platinum and taxane for at least 6 cycles followed by PARP inhibitor maintenance with Niraparib.   CT chest, abdomen and pelvis was done on 12/20/2017 showed response to therapy.   Pt completed 6 cycles of Carboplatin and Taxol and was last treated on 12/05/2017.     Pt is on Niraparib 300 mg once daily  for Parp inhibitor maintenance.   She is tolerating therapy other than minimal nausea.    CT CAP done 08/20/2018 reviewed and showed  Impression:At the site of the original nodule along the gastrosplenic ligament there is only a very faint 0.9 by 0.5 cm focus of stranding in the adipose tissues on image 55/2 which is roughly stable by my measurements although slightly less solid appearing. 4 mm indistinct focus of nodularity in the omentum on image 70/2  merits Surveillance.  Findings appear stable.  She will be set up for repeat Scans in 11/2018 and will follow-up at that time with labs and scan results.  RX for Niraparib sent to pharmacy.  Pt should continue to follow-up with Dr. Denman George and is scheduled to see her in 11/2018.   Pt should continue  Niraparib and will remain on therapy until 24 months disease free.  She began treatment 12/30/2017.    Labs done 08/13/2018 reviewed and showed WBC 5.1 HB 10 plts 304,000.  Chemistries WNL with K+ 3.7, Cr 1.11 and normal LFTs.  CA 125 in 06/2018 was WNL at 7.  She will have repeat labs done in 11/2018 with tumor marker.   2.  Nausea.  She reports this is minimal.  Continue compazine as recommended.  3.  Hypertension.  BP 94/63.  Follow-up with PCP.   4.  B12 deficiency.  Hemoglobin 10 on labs done 08/13/2018.  Previously, B12, MMA and folate WNL. MCV elevated at 110.  Continue B12.   5.  RI.  Cr stable at 1.11.  Will repeat labs in 11/2018.    6.  Health maintenance.  Continue Mammogram screening and GI evaluation as recommended.    25 minutes spent with more than 50% spent in counseling and coordination of care.    Current Status: Patient seen today for follow-up.  She is here to go over labs and scans.  She remains on Niraparib.  She is tolerating therapy.     Ovarian cancer ([place])   04/25/2015 - 04/28/2015 Hospital Admission    Nausea/diarrhea.  Oncology consult  completed on 04/27/2015.    04/25/2015 Tumor Marker    CA 125- 3902.      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    04/27/2015 Procedure    US Paracentesis- A total of approximately 3700 mL of amber colored fluid was removed. A fluid sample was sent for laboratory analysis.    04/27/2015 Imaging    CT Chest- Mild left supraclavicular lymphadenopathy and moderate mediastinal  lymphadenopathy involving the prevascular, subcarinal and bilateral pericardiophrenic nodal chains, likely metastatic.    04/27/2015 Pathology Results    Diagnosis PERITONEAL/ASCITIC FLUID(SPECIMEN 1 OF 1 COLLECTED 04/27/15): MALIGNANT CELLS CONSISTENT WITH METASTATIC ADENOCARCINOMA.  The immunophenotype is most consistent with a gynecologic primary, most likely ovary.    05/08/2015 Miscellaneous    Seen by Dr. Denman George- recommending Carboplatin/Paclitaxel x 3 cycles and return visit to see her (within 1 week of administration of third cycle) to evaluate for optimal sequencing of treatment modalities.    05/13/2015 - 05/15/2015 Hospital Admission    Hospitalized for AKI    05/19/2015 - 07/01/2015 Chemotherapy    Carboplatin/Paclitaxel x 3 cycles    07/28/2015 Surgery    Exploratory laparotomy with total abdominal hysterectomy, bilateral salpingo-oophorectomy, omentectomy radical tumor debulking for ovarian cancer; by Dr. Everitt Amber.    07/28/2015 Pathology Results    Uterus +/- tubes/ovaries, neoplastic, cervix - HIGH GRADE SEROUS CARCINOMA INVOLVING LEFT OVARY, LEFT FALLOPIAN TUBE AND RIGHT FALLOPIAN TUBE. - CERVIX, ENDOMETRIUM AND MYOMETRIUM ARE FREE OF TUMOR. 2. Cul-de-sac biop...    08/26/2015 -  Chemotherapy    Carboplatin/Paclitaxel x 3 cycles    12/23/2015 Miscellaneous    Genetic counseling.  Genetic testing was normal, and did not reveal a deleterious mutation in these genes    03/07/2017 Imaging    CT C/A/P IMPRESSION: Chest Impression:  No evidence of metastatic disease in thorax.  Abdomen / Pelvis Impression:  1. Interval increase in size of solitary nodular peritoneal implant in the LEFT upper quadrant . 2. No additional evidence of peritoneal nodularity. 3. No ascites.    07/31/2017 Progression    CT C/A/P: IMPRESSION: 1. Left upper quadrant nodule continues to progress, now measuring 20 x 26 mm. 2. New nodule identified between the in adrenal gland, concerning for  metastatic deposit. 3. Interval development of tiny nodules in the left lower quadrant mesenteric, concerning for metastases. 4. No ascites. 5. Status post total abdominal hysterectomy and omentectomy.      Problem List Patient Active Problem List   Diagnosis Date Noted  . 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 Genetic testing [Z13.79] 12/15/2015  . Ileus, postoperative ([place]) [K91.89, K56.7] 08/01/2015  . Acute renal failure ([place]) [N17.9] 05/13/2015  . Malnutrition of moderate degree ([place]) [E44.0] 04/28/2015  . Renal failure [N19] 04/25/2015  . Nausea without vomiting [R11.0] 04/25/2015  . Ascites, malignant [R18.0] 04/25/2015  . Ovarian cancer ([place]) [C56.9] 04/25/2015  . Omental mass [K66.8] 04/25/2015  . Acute kidney injury ([place]) [N17.9] 04/25/2015  . UTI (lower urinary tract infection) [N39.0] 04/25/2015  . Normocytic anemia [D64.9] 04/25/2015  . 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 Schizophrenia, unspecified type ([place]) [F20.9] 04/25/2015  . Diarrhea [R19.7] 04/25/2015    Past Medical History Past Medical History:  Diagnosis Date  . Arthritis   . B12 deficiency   . Cataract   .  History of blood transfusion   . History of chemotherapy   . Hypertension   . Iron deficiency anemia   . Mixed hyperlipidemia   . Ovarian cancer ([place])   . 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 Schizophrenia ([place])   . Ulcerative colitis ([place])   . Vitamin D deficiency     Past Surgical History Past Surgical History:  Procedure Laterality Date  . ABDOMINAL HYSTERECTOMY N/A 07/28/2015   Procedure: TOTAL HYSTERECTOMY ABDOMINAL BILATERAL SALPINGO OOPHRORECTOMY;  Surgeon: Everitt Amber, MD;  Location: [facility];  Service: Gynecology;  Laterality: N/A;  . DEBULKING N/A 07/28/2015   Procedure: DEBULKING;  Surgeon: Everitt Amber, MD;  Location: [facility];  Service: Gynecology;  Laterality: N/A;  . LAPAROTOMY N/A 07/28/2015   Procedure: EXPLORATORY LAPAROTOMY;   Surgeon: Everitt Amber, MD;  Location: [facility];  Service: Gynecology;  Laterality: N/A;  . OMENTECTOMY N/A 07/28/2015   Procedure: OMENTECTOMY ;  Surgeon: Everitt Amber, MD;  Location: [facility];  Service: Gynecology;  Laterality: N/A;  . PARACENTESIS  04/27/15  . PORTACATH PLACEMENT Right 04/2015  . REPLACEMENT TOTAL KNEE     right knee in 2003    Family History Family History  Problem Relation Age of Onset  . Prostate cancer Brother   . Cancer Paternal Uncle        1 uncle with cancer NOS  . Lung cancer Maternal Grandmother   . Hypertension Maternal Grandfather   . Congestive Heart Failure Mother   . Seizures Sister      Social History  reports that she has never smoked. She has never used smokeless tobacco. She reports that she does not drink alcohol or use drugs.  Medications  Current Outpatient Medications:  .  acetaminophen (TYLENOL) 500 MG tablet, Take 2 tablets (1,000 mg total) by mouth every 12 (twelve) hours., Disp: 30 tablet, Rfl: 0 .  amLODipine (NORVASC) 10 MG tablet, Take 10 mg by mouth every evening. , Disp: , Rfl:  .  aspirin EC 81 MG tablet, Take 81 mg by mouth daily., Disp: , Rfl:  .  cholecalciferol (VITAMIN D) 1000 units tablet, , Disp: , Rfl: 7 .  Cholecalciferol (VITAMIN D-3) 1000 UNITS CAPS, Take 1,000 Units by mouth daily. , Disp: , Rfl:  .  docusate sodium (COLACE) 100 MG capsule, Take 100 mg by mouth 2 (two) times daily., Disp: , Rfl:  .  folic acid (FOLVITE) 1 MG tablet, Take 1 mg by mouth daily., Disp: , Rfl:  .  lidocaine-prilocaine (EMLA) cream, Apply to affected area once, Disp: 30 g, Rfl: 3 .  lisinopril (PRINIVIL,ZESTRIL) 40 MG tablet, , Disp: , Rfl: 2 .  lovastatin (MEVACOR) 10 MG tablet, Take 10 mg by mouth at bedtime., Disp: , Rfl:  .  metoprolol tartrate (LOPRESSOR) 25 MG tablet, Take 1 tablet (25 mg total) by mouth 2 (two) times daily., Disp: 60 tablet, Rfl: 6 .  mirtazapine (REMERON) 15 MG tablet, Take 7.5 mg by mouth at bedtime., Disp: , Rfl:  .   Multiple Vitamin (MULTIVITAMIN WITH MINERALS) TABS tablet, Take 1 tablet by mouth daily., Disp: , Rfl:  .  Niraparib Tosylate (ZEJULA) 100 MG CAPS, Take 300 mg by mouth daily., Disp: 90 capsule, Rfl: 2 .  ondansetron (ZOFRAN) 8 MG tablet, Take 1 tablet (8 mg total) by mouth 2 (two) times daily as needed for refractory nausea / vomiting. Start on day 3 after chemo., Disp: 30 tablet, Rfl: 1 .  prochlorperazine (COMPAZINE) 10 MG tablet, Take 1 tablet (  10 mg total) by mouth every 6 (six) hours as needed (Nausea or vomiting)., Disp: 60 tablet, Rfl: 3 .  QUEtiapine (SEROQUEL XR) 300 MG 24 hr tablet, Take 300 mg by mouth at bedtime., Disp: , Rfl:  .  sulfaSALAzine (AZULFIDINE) 500 MG tablet, Take 500 mg by mouth 3 (three) times daily., Disp: , Rfl:  .  traMADol (ULTRAM) 50 MG tablet, Take 1 tablet (50 mg total) by mouth every 6 (six) hours as needed., Disp: 90 tablet, Rfl: 1 .  vitamin B-12 (CYANOCOBALAMIN) 1000 MCG tablet, Take 1,000 mcg by mouth daily., Disp: , Rfl:   Allergies Patient has no known allergies.  Review of Systems Review of Systems - Oncology ROS negative   Physical Exam  Vitals Wt Readings from Last 3 Encounters:  08/24/18 162 lb 11.2 oz (73.8 kg)  07/09/18 161 lb (73 kg)  04/16/18 159 lb 9.6 oz (72.4 kg)   Temp Readings from Last 3 Encounters:  08/24/18 97.6 F (36.4 C) (Oral)  07/09/18 98 F (36.7 C) (Oral)  04/16/18 98.2 F (36.8 C) (Oral)   BP Readings from Last 3 Encounters:  08/24/18 94/63  07/19/18 125/76  07/09/18 (!) 159/74   Pulse Readings from Last 3 Encounters:  08/24/18 88  07/19/18 87  07/09/18 76   Constitutional: Well-developed, well-nourished, and in no distress.   HENT: Head: Normocephalic and atraumatic.  Mouth/Throat: No oropharyngeal exudate. Mucosa moist. Eyes: Pupils are equal, round, and reactive to light. Conjunctivae are normal. No scleral icterus.  Neck: Normal range of motion. Neck supple. No JVD present.  Cardiovascular: Normal  rate, regular rhythm and normal heart sounds.  Exam reveals no gallop and no friction rub.   No murmur heard. Pulmonary/Chest: Effort normal and breath sounds normal. No respiratory distress. No wheezes.No rales.  Abdominal: Soft. Bowel sounds are normal. No distension. There is no tenderness. There is no guarding.  Scar noted on abdomen from prior surgeries.   Musculoskeletal: No edema or tenderness.  Lymphadenopathy: No cervical, axillary or supraclavicular adenopathy.  Neurological: Alert and oriented to person, place, and time. No cranial nerve deficit.  Skin: Skin is warm and dry. No rash noted. No erythema. No pallor.  Psychiatric: Affect and judgment normal.   Labs No visits with results within 3 Day(s) from this visit.  Latest known visit with results is:  Appointment on 08/13/2018  Component Date Value Ref Range Status  . WBC 08/13/2018 5.1  4.0 - 10.5 K/uL Final  . RBC 08/13/2018 2.70* 3.87 - 5.11 MIL/uL Final  . Hemoglobin 08/13/2018 10.0* 12.0 - 15.0 g/dL Final  . HCT 08/13/2018 30.7* 36.0 - 46.0 % Final  . MCV 08/13/2018 113.7* 80.0 - 100.0 fL Final  . MCH 08/13/2018 37.0* 26.0 - 34.0 pg Final  . MCHC 08/13/2018 32.6  30.0 - 36.0 g/dL Final  . RDW 08/13/2018 13.2  11.5 - 15.5 % Final  . Platelets 08/13/2018 304  150 - 400 K/uL Final  . nRBC 08/13/2018 0.0  0.0 - 0.2 % Final  . Neutrophils Relative % 08/13/2018 62  % Final  . Neutro Abs 08/13/2018 3.2  1.7 - 7.7 K/uL Final  . Lymphocytes Relative 08/13/2018 18  % Final  . Lymphs Abs 08/13/2018 0.9  0.7 - 4.0 K/uL Final  . Monocytes Relative 08/13/2018 15  % Final  . Monocytes Absolute 08/13/2018 0.8  0.1 - 1.0 K/uL Final  . Eosinophils Relative 08/13/2018 4  % Final  . Eosinophils Absolute 08/13/2018 0.2  0.0 - 0.5  K/uL Final  . Basophils Relative 08/13/2018 1  % Final  . Basophils Absolute 08/13/2018 0.1  0.0 - 0.1 K/uL Final  . WBC Morphology 08/13/2018 TOXIC GRANULATION   Final  . RBC Morphology 08/13/2018  MACROCYTES PRESENT   Final  . Immature Granulocytes 08/13/2018 0  % Final  . Abs Immature Granulocytes 08/13/2018 0.02  0.00 - 0.07 K/uL Final   Performed at [facility], [address] [place], [place] [address]  . Sodium 08/13/2018 142  135 - 145 mmol/L Final  . Potassium 08/13/2018 3.7  3.5 - 5.1 mmol/L Final  . Chloride 08/13/2018 105  98 - 111 mmol/L Final  . CO2 08/13/2018 29  22 - 32 mmol/L Final  . Glucose, Bld 08/13/2018 94  70 - 99 mg/dL Final  . BUN 08/13/2018 14  8 - 23 mg/dL Final  . Creatinine, Ser 08/13/2018 1.11* 0.44 - 1.00 mg/dL Final  . Calcium 08/13/2018 9.4  8.9 - 10.3 mg/dL Final  . Total Protein 08/13/2018 7.6  6.5 - 8.1 g/dL Final  . Albumin 08/13/2018 4.4  3.5 - 5.0 g/dL Final  . AST 08/13/2018 33  15 - 41 U/L Final  . ALT 08/13/2018 27  0 - 44 U/L Final  . Alkaline Phosphatase 08/13/2018 79  38 - 126 U/L Final  . Total Bilirubin 08/13/2018 0.5  0.3 - 1.2 mg/dL Final  . GFR calc non Af Amer 08/13/2018 47* >60 mL/min Final  . GFR calc Af Amer 08/13/2018 55* >60 mL/min Final   Comment: (NOTE) The eGFR has been calculated using the CKD EPI equation. This calculation has not been validated in all clinical situations. eGFR's persistently <60 mL/min signify possible Chronic Kidney Disease.   Georgiann Hahn gap 08/13/2018 8  5 - 15 Final   Performed at [facility], [address], [place], [place] [address]  . LDH 08/13/2018 185  98 - 192 U/L Final   Performed at [facility], [address] [place], [place] [address]     Pathology Orders Placed This Encounter  Procedures  . CT CHEST W CONTRAST    Standing Status:   Future    Standing Expiration Date:   08/24/2019    Order Specific Question:   If indicated for the ordered procedure, I authorize the administration of contrast media per Radiology protocol    Answer:   Yes    Order Specific Question:   Preferred imaging location?    Answer:   [facility]    Order Specific Question:   Radiology Contrast  Protocol - do NOT remove file path    Answer:   \\[email or web address]  . CT ABDOMEN PELVIS W CONTRAST    Standing Status:   Future    Standing Expiration Date:   08/24/2019    Order Specific Question:   If indicated for the ordered procedure, I authorize the administration of contrast media per Radiology protocol    Answer:   Yes    Order Specific Question:   Preferred imaging location?    Answer:   [facility]    Order Specific Question:   Is Oral Contrast requested for this exam?    Answer:   Yes, Per Radiology protocol    Order Specific Question:   Radiology Contrast Protocol - do NOT remove file path    Answer:   \\[email or web address]  . CBC with Differential/Platelet    Standing Status:   Future    Standing Expiration Date:   08/24/2020  .  Comprehensive metabolic panel    Standing Status:   Future    Standing Expiration Date:   08/24/2020  . Lactate dehydrogenase    Standing Status:   Future    Standing Expiration Date:   08/24/2020  . CA 125    Standing Status:   Future    Standing Expiration Date:   08/24/2020       Zoila Shutter MD

## 2018-09-07 ENCOUNTER — Telehealth: Payer: Self-pay | Admitting: Pharmacist

## 2018-09-07 ENCOUNTER — Telehealth (HOSPITAL_COMMUNITY): Payer: Self-pay | Admitting: Pharmacy Technician

## 2018-09-07 NOTE — Telephone Encounter (Signed)
Oral Oncology Patient Advocate Encounter  Spoke to patient and she is ok with filling with [facility].  I have scheduled her Zejula to be filled and mailed on 12/23 for delivery 12/24 or 12/26 because of the holiday.  Patient has 10+ days on hand of medication as of today.  Copay is $0.00.  [place] [facility] [place] Phone [phone] Fax [phone] 09/07/2018 8:40 AM

## 2018-09-07 NOTE — Telephone Encounter (Signed)
Oral Chemotherapy Pharmacist Encounter    Spoke with patient's son Jeneen Rinks to review dosing, administration, and side effects for Zejula. Jeneen Rinks reported no new side effects or concerns since they were last seen in the office.   Darl Pikes, PharmD, BCPS, [facility] Hematology/Oncology Clinical Pharmacist [facility] [phone]  09/07/2018 4:29 PM

## 2018-10-09 ENCOUNTER — Other Ambulatory Visit (HOSPITAL_COMMUNITY): Payer: Self-pay | Admitting: *Deleted

## 2018-10-09 DIAGNOSIS — C562 Malignant neoplasm of left ovary: Secondary | ICD-10-CM

## 2018-10-09 MED ORDER — NIRAPARIB TOSYLATE 100 MG PO CAPS: 300.0000 mg | ORAL_CAPSULE | Freq: Every day | ORAL | 3 refills | Status: DC

## 2018-10-09 NOTE — Telephone Encounter (Signed)
Chart reviewed per Dr. Walden Field last note, okay to continue Zejula, Zejula refilled.

## 2018-11-07 ENCOUNTER — Other Ambulatory Visit (HOSPITAL_COMMUNITY): Payer: Self-pay | Admitting: Internal Medicine

## 2018-11-15 ENCOUNTER — Ambulatory Visit (HOSPITAL_COMMUNITY)
Admission: RE | Admit: 2018-11-15 | Discharge: 2018-11-15 | Disposition: A | Discharge status: Home / Self Care | Payer: Medicare Other | Source: Ambulatory Visit | Attending: Internal Medicine | Admitting: Internal Medicine

## 2018-11-15 ENCOUNTER — Inpatient Hospital Stay (HOSPITAL_COMMUNITY): Payer: Medicare Other | Attending: Hematology

## 2018-11-15 DIAGNOSIS — Z9221 Personal history of antineoplastic chemotherapy: Secondary | ICD-10-CM | POA: Diagnosis not present

## 2018-11-15 DIAGNOSIS — E538 Deficiency of other specified B group vitamins: Secondary | ICD-10-CM | POA: Diagnosis not present

## 2018-11-15 DIAGNOSIS — C569 Malignant neoplasm of unspecified ovary: Secondary | ICD-10-CM

## 2018-11-15 DIAGNOSIS — Z7982 Long term (current) use of aspirin: Secondary | ICD-10-CM | POA: Insufficient documentation

## 2018-11-15 DIAGNOSIS — C786 Secondary malignant neoplasm of retroperitoneum and peritoneum: Secondary | ICD-10-CM | POA: Insufficient documentation

## 2018-11-15 DIAGNOSIS — Z79899 Other long term (current) drug therapy: Secondary | ICD-10-CM | POA: Insufficient documentation

## 2018-11-15 DIAGNOSIS — C562 Malignant neoplasm of left ovary: Secondary | ICD-10-CM | POA: Diagnosis present

## 2018-11-15 DIAGNOSIS — Z90710 Acquired absence of both cervix and uterus: Secondary | ICD-10-CM | POA: Diagnosis not present

## 2018-11-15 DIAGNOSIS — C7800 Secondary malignant neoplasm of unspecified lung: Secondary | ICD-10-CM | POA: Insufficient documentation

## 2018-11-15 DIAGNOSIS — I10 Essential (primary) hypertension: Secondary | ICD-10-CM | POA: Insufficient documentation

## 2018-11-15 LAB — COMPREHENSIVE METABOLIC PANEL
ALK PHOS: 99 U/L (ref 38–126)
ALT: 28 U/L (ref 0–44)
AST: 32 U/L (ref 15–41)
Albumin: 4.6 g/dL (ref 3.5–5.0)
Anion gap: 10 (ref 5–15)
BILIRUBIN TOTAL: 0.5 mg/dL (ref 0.3–1.2)
BUN: 9 mg/dL (ref 8–23)
CALCIUM: 9.8 mg/dL (ref 8.9–10.3)
CO2: 27 mmol/L (ref 22–32)
CREATININE: 1.14 mg/dL — AB (ref 0.44–1.00)
Chloride: 101 mmol/L (ref 98–111)
GFR calc Af Amer: 54 mL/min — ABNORMAL LOW (ref >60)
GFR calc non Af Amer: 47 mL/min — ABNORMAL LOW (ref >60)
GLUCOSE: 109 mg/dL — AB (ref 70–99)
Potassium: 3.9 mmol/L (ref 3.5–5.1)
Sodium: 138 mmol/L (ref 135–145)
TOTAL PROTEIN: 7.9 g/dL (ref 6.5–8.1)

## 2018-11-15 LAB — CBC WITH DIFFERENTIAL/PLATELET
Abs Immature Granulocytes: 0.01 10*3/uL (ref 0.00–0.07)
Basophils Absolute: 0 10*3/uL (ref 0.0–0.1)
Basophils Relative: 1 %
EOS PCT: 4 %
Eosinophils Absolute: 0.2 10*3/uL (ref 0.0–0.5)
HEMATOCRIT: 34.5 % — AB (ref 36.0–46.0)
HEMOGLOBIN: 10.9 g/dL — AB (ref 12.0–15.0)
IMMATURE GRANULOCYTES: 0 %
LYMPHS ABS: 1.1 10*3/uL (ref 0.7–4.0)
LYMPHS PCT: 23 %
MCH: 34.5 pg — ABNORMAL HIGH (ref 26.0–34.0)
MCHC: 31.6 g/dL (ref 30.0–36.0)
MCV: 109.2 fL — AB (ref 80.0–100.0)
MONOS PCT: 13 %
Monocytes Absolute: 0.6 10*3/uL (ref 0.1–1.0)
NRBC: 0 % (ref 0.0–0.2)
Neutro Abs: 2.9 10*3/uL (ref 1.7–7.7)
Neutrophils Relative %: 59 %
Platelets: 305 10*3/uL (ref 150–400)
RBC: 3.16 MIL/uL — ABNORMAL LOW (ref 3.87–5.11)
RDW: 12.9 % (ref 11.5–15.5)
WBC: 4.9 10*3/uL (ref 4.0–10.5)

## 2018-11-15 LAB — LACTATE DEHYDROGENASE: LDH: 198 U/L — ABNORMAL HIGH (ref 98–192)

## 2018-11-15 MED ORDER — IOHEXOL 300 MG/ML  SOLN
100.0000 mL | Freq: Once | INTRAMUSCULAR | Status: AC | PRN
  Administered 2018-11-15: 100 mL via INTRAVENOUS

## 2018-11-16 LAB — CA 125: CANCER ANTIGEN (CA) 125: 6.7 U/mL (ref 0.0–38.1)

## 2018-11-20 ENCOUNTER — Inpatient Hospital Stay: Payer: Medicare Other | Attending: Gynecologic Oncology | Admitting: Gynecologic Oncology

## 2018-11-20 ENCOUNTER — Encounter: Payer: Self-pay | Admitting: Gynecologic Oncology

## 2018-11-20 VITALS — BP 158/64 | HR 77 | Temp 98.0°F | Resp 18 | Ht 66.0 in | Wt 166.0 lb

## 2018-11-20 DIAGNOSIS — C786 Secondary malignant neoplasm of retroperitoneum and peritoneum: Secondary | ICD-10-CM

## 2018-11-20 DIAGNOSIS — C569 Malignant neoplasm of unspecified ovary: Secondary | ICD-10-CM | POA: Diagnosis present

## 2018-11-20 DIAGNOSIS — Z90722 Acquired absence of ovaries, bilateral: Secondary | ICD-10-CM

## 2018-11-20 DIAGNOSIS — C778 Secondary and unspecified malignant neoplasm of lymph nodes of multiple regions: Secondary | ICD-10-CM

## 2018-11-20 DIAGNOSIS — Z9221 Personal history of antineoplastic chemotherapy: Secondary | ICD-10-CM

## 2018-11-20 DIAGNOSIS — Z90710 Acquired absence of both cervix and uterus: Secondary | ICD-10-CM

## 2018-11-20 NOTE — Patient Instructions (Signed)
Please notify Dr Denman George at phone number [phone] if you notice vaginal bleeding, new pelvic or abdominal pains, bloating, feeling full easy, or a change in bladder or bowel function.   Dr Denman George will see you back for check up in September, 2020. Dr Ishmael Holter will see you between now and then. Please continue to take the Olaparib tablets as directed.  Please contact Dr Serita Grit office (at [phone]) in May to request an appointment with her for September, 2020.

## 2018-11-20 NOTE — Progress Notes (Signed)
Followup Note: Gyn-Onc  Latasha [name] 78 y.o. 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 Malignant neoplasm of ovary, unspecified laterality [facility])    Assessment/Plan:  [name]  is a 78 y.o.  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 BRCA negative on testing.  S/p salvage 2nd line chemotherapy with carb/taxol completed 11/30/17. Now on Niraparib since March, 2019.  No measurable disease.  Tolerating maintenance PARPi. Recommend continuing this until 24 months (March, 2021) disease free at which time it can be suspended.  I will see Latasha [name] back in September, 2020 for a pelvic exam as part of surveillance.   HPI: Latasha [name] is a very pleasant 78 year old G1 who is seen in consultation at the request of Dr Whitney Muse for (clinical) stage IVB ovarian cancer. The patient developed symptoms of progressive abdominal fullness and discomfort over the course of several months. In August, 2016 she presented to the [place] where imaging of the abdomen and pelvis was obtained for diarrhea, nausea and emesis. Imaging on 04/25/15 revealed large volume ascites, carcinomatosis and peritoneal masses measuring up to 5.1cm. The patient underwent paracentesis with cytology on 04/27/15 which revealed metastatic adenocarcinoma consistent on immunostains with gyn primary.  CT of the chest on 04/27/15 revealed mild left supraclavicular lymphadenopathy, and mediastinal lymphadenopathy consistent with metastatic disease. There were <2m pulmonary nodules also seen which were indeterminant. There were trace pleural effusions seen.   CA 125 on 04/25/15: 3902.  She then went on to receive 3 cycles of paclitaxel and carboplatin chemotherapy with Dr PWhitney Muse Cycle 3 was on 07/01/15.  CA 125 on 07/01/15 was 249.  CT scan on 07/20/15 showed: Previously noted ascites has resolved. No  pneumoperitoneum. Many of the previously noted peritoneal implants are no longer confidently identified on today's examination. There are few smaller implants noted, the largest of which is in th  left side of the abdomen anteriorly measuring 9 x 11 mm. There is also a 10 x 7 mm lesion superficial to the splenic flexure of the colon which is substantially smaller than the prior study (previously 2.8 x 1.7 cm).  There was resolution of the chest adenopathy.  She has tolerated chemotherapy well with a good appetites and good general function.  On 07/31/15 she was taken to the OR for an ex lap, TAH, BSO, omentectomy, radical tumor debulking. Intraoperative findings included: Excellent response to neoadjuvant chemotherapy. Small volume plaques/nodules in left and right posterior cul de sac behind uterus, tumor on left ovary (3cm), multiple 2cm nodules in omentum. No other peritoneal disease. Diaphragm normal.  She had an optimal cytoreduction (R0) with no gross visible disease remaining.  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  Postoperatively she did very well with no postoperative complications.  She went on to receive 3 additional cycles of carboplatin and paclitaxel completed April, 2017. She tolerated therapy very well.  Post treatment scans were unremarkable and showed no residual disease.  Her CA 125 was monitored at 3 monthly intervals. In January, 2018 it was normal at 17. On April, 20th ,2018 it had increased to 32. On June 15th, 2018 it had increased again to 47. CT chest/abdo/pelvis on 03/07/17 showed solitary pertoneal nodule in the gastrosplenic ligament measuring 123mx 1664mincreased from a prior scan where it was 1cm). No additional nodularity is noted nor ascites.  She  was offered secondary cytoreduction surgery followed by chemotherapy, however she declined this and opted for expectant management because she felt good.  Interval Hx:  On  07/31/17 repeat CT abdo/pelvis was performed. It showed progression of peritoneal disease with an enlargement of a peri-splenic lesion, a new nodule adjacent to the adrenal gland and a new left lower quadrant peritoneal metastases.  She agreed to commend salvage 2nd line chemotherapy with carboplatin and paclitaxel in December, 2018 due to the onset of symptomatic recurrence. She completed 6 cycles of this on 11/30/17 and was then changed to Niraparib maintenance 329m daily starting in March, 2019.  CA 125 was normal at 9 on 03/26/18.  CT scan abd/pelvis in July, 2019 was normal with no evidence of recurrent/progressive disease.  CT scan in December, 2019 was stable with no progression.  CA 125 on 11/15/18 was normal and stable at 6.7. CT scan abd/pelvis on 11/15/18 showed no new progression or recurrence.   She continues to feel well with no abdominal symptoms and an excellent performance status (ECOG 0).  She denies thrombocytopenia. She has mild occasional nausea.   Current Meds:  Outpatient Encounter Medications as of 11/20/2018  Medication Sig  . acetaminophen (TYLENOL) 500 MG tablet Take 2 tablets (1,000 mg total) by mouth every 12 (twelve) hours.  .Marland KitchenamLODipine (NORVASC) 10 MG tablet Take 10 mg by mouth every evening.   .Marland Kitchenaspirin EC 81 MG tablet Take 81 mg by mouth daily.  . Cholecalciferol (VITAMIN D-3) 1000 UNITS CAPS Take 1,000 Units by mouth daily.   .Marland Kitchendocusate sodium (COLACE) 100 MG capsule Take 100 mg by mouth 2 (two) times daily.  . folic acid (FOLVITE) 1 MG tablet Take 1 mg by mouth daily.  .Marland Kitchenlidocaine-prilocaine (EMLA) cream Apply to affected area once  . lisinopril (PRINIVIL,ZESTRIL) 40 MG tablet   . lovastatin (MEVACOR) 10 MG tablet Take 10 mg by mouth at bedtime.  . metoprolol tartrate (LOPRESSOR) 25 MG tablet Take 1 tablet (25 mg total) by mouth 2 (two) times daily.  . mirtazapine (REMERON) 15 MG tablet Take 7.5 mg by mouth at bedtime.  . Multiple Vitamin (MULTIVITAMIN WITH  MINERALS) TABS tablet Take 1 tablet by mouth daily.  .Alanda SlimTosylate (ZEJULA) 100 MG CAPS Take 300 mg by mouth daily.  . prochlorperazine (COMPAZINE) 10 MG tablet Take 1 tablet (10 mg total) by mouth every 6 (six) hours as needed (Nausea or vomiting).  . QUEtiapine (SEROQUEL XR) 300 MG 24 hr tablet Take 300 mg by mouth at bedtime.  . sulfaSALAzine (AZULFIDINE) 500 MG tablet Take 500 mg by mouth 3 (three) times daily.  . traMADol (ULTRAM) 50 MG tablet TAKE (1) TABLET BY MOUTH EVERY SIX HOURS AS NEEDED.  .Marland Kitchenvitamin B-12 (CYANOCOBALAMIN) 1000 MCG tablet Take 1,000 mcg by mouth daily.  . [DISCONTINUED] cholecalciferol (VITAMIN D) 1000 units tablet   . ondansetron (ZOFRAN) 8 MG tablet Take 1 tablet (8 mg total) by mouth 2 (two) times daily as needed for refractory nausea / vomiting. Start on day 3 after chemo. (Patient not taking: Reported on 11/20/2018)   No facility-administered encounter medications on file as of 11/20/2018.     Allergy: No Known Allergies  Social Hx:   Social History   Socioeconomic History  . Marital status: Single    Spouse name: Not on file  . Number of children: 1  . Years of education: Not on file  . Highest education level: Not on file  Occupational History  . Not  on file  Social Needs  . Financial resource strain: Not on file  . Food insecurity:    Worry: Not on file    Inability: Not on file  . Transportation needs:    Medical: Not on file    Non-medical: Not on file  Tobacco Use  . Smoking status: Never Smoker  . Smokeless tobacco: Never Used  Substance and Sexual Activity  . Alcohol use: No  . Drug use: No  . Sexual activity: Not Currently  Lifestyle  . Physical activity:    Days per week: Not on file    Minutes per session: Not on file  . Stress: Not on file  Relationships  . Social connections:    Talks on phone: Not on file    Gets together: Not on file    Attends religious service: Not on file    Active member of club or organization:  Not on file    Attends meetings of clubs or organizations: Not on file    Relationship status: Not on file  . Intimate partner violence:    Fear of current or ex partner: Not on file    Emotionally abused: Not on file    Physically abused: Not on file    Forced sexual activity: Not on file  Other Topics Concern  . Not on file  Social History Narrative  . Not on file    Past Surgical Hx:  Past Surgical History:  Procedure Laterality Date  . ABDOMINAL HYSTERECTOMY N/A 07/28/2015   Procedure: TOTAL HYSTERECTOMY ABDOMINAL BILATERAL SALPINGO OOPHRORECTOMY;  Surgeon: Everitt Amber, MD;  Location: [facility];  Service: Gynecology;  Laterality: N/A;  . DEBULKING N/A 07/28/2015   Procedure: DEBULKING;  Surgeon: Everitt Amber, MD;  Location: [facility];  Service: Gynecology;  Laterality: N/A;  . LAPAROTOMY N/A 07/28/2015   Procedure: EXPLORATORY LAPAROTOMY;  Surgeon: Everitt Amber, MD;  Location: [facility];  Service: Gynecology;  Laterality: N/A;  . OMENTECTOMY N/A 07/28/2015   Procedure: OMENTECTOMY ;  Surgeon: Everitt Amber, MD;  Location: [facility];  Service: Gynecology;  Laterality: N/A;  . PARACENTESIS  04/27/15  . PORTACATH PLACEMENT Right 04/2015  . REPLACEMENT TOTAL KNEE     right knee in 2003    Past Medical Hx:  Past Medical History:  Diagnosis Date  . Arthritis   . B12 deficiency   . Cataract   . History of blood transfusion   . History of chemotherapy   . Hypertension   . Iron deficiency anemia   . Mixed hyperlipidemia   . Ovarian cancer ([place])   . 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 Schizophrenia ([place])   . Ulcerative colitis ([place])   . Vitamin D deficiency     Past Gynecological History:  SVD x 1  No LMP recorded. Patient has had a hysterectomy.  Family Hx:  Family History  Problem Relation Age of Onset  . Prostate cancer Brother   . Cancer Paternal Uncle        1 uncle with cancer NOS  . Lung cancer Maternal Grandmother   . Hypertension Maternal Grandfather   . Congestive Heart Failure Mother    . Seizures Sister     Review of Systems:  Constitutional  Feels well,    ENT Normal appearing ears and nares bilaterally Skin/Breast  No rash, sores, jaundice, itching, dryness Cardiovascular  No chest pain, shortness of breath, or edema  Pulmonary  No cough or wheeze.  Gastro Intestinal  No nausea, vomitting, or diarrhoea.  No bright red blood per rectum, no abdominal pain, change in bowel movement, or constipation.  Genito Urinary  No frequency, urgency, dysuria, no postmenopausal bleeding Musculo Skeletal  No myalgia, arthralgia, joint swelling or pain  Neurologic  No weakness, numbness, change in gait,  Psychology  No depression, anxiety, insomnia.   Vitals:  Blood pressure (!) 158/64, pulse 77, temperature 98 F (36.7 C), temperature source Oral, resp. rate 18, height _0  (1.676 m), weight 166 lb (75.3 kg), SpO2 99 %.  Physical Exam: WD in NAD Neck  Supple NROM, without any enlargements.  Lymph Node Survey No cervical supraclavicular or inguinal adenopathy Cardiovascular  Pulse normal rate, regularity and rhythm. S1 and S2 normal.  Lungs  Clear to auscultation bilateraly, without wheezes/crackles/rhonchi. Good air movement.  Skin  No rash/lesions/breakdown  Psychiatry  Alert and oriented to person, place, and time  Abdomen  Normoactive bowel sounds, abdomen soft, non distended, non-tender and thin without evidence of hernia.  Incision well healed. No palpable masses. Back No CVA tenderness Genito Urinary  Vaginal cuff normal with no lesions. In tact vagina.No bleeding. Surgically absent uterus and cervix. Extremities  No bilateral cyanosis, clubbing or edema.   Thereasa Solo, MD   11/20/2018, 1:25 PM

## 2018-11-23 ENCOUNTER — Ambulatory Visit (HOSPITAL_COMMUNITY): Payer: Medicare Other | Admitting: Hematology

## 2018-11-23 ENCOUNTER — Other Ambulatory Visit: Payer: Self-pay

## 2018-11-23 ENCOUNTER — Encounter (HOSPITAL_COMMUNITY): Payer: Self-pay | Admitting: Hematology

## 2018-11-23 ENCOUNTER — Inpatient Hospital Stay (HOSPITAL_COMMUNITY): Payer: Medicare Other | Attending: Internal Medicine | Admitting: Hematology

## 2018-11-23 VITALS — BP 152/71 | HR 75 | Temp 97.8°F | Resp 20 | Wt 167.0 lb

## 2018-11-23 DIAGNOSIS — I10 Essential (primary) hypertension: Secondary | ICD-10-CM | POA: Insufficient documentation

## 2018-11-23 DIAGNOSIS — Z90710 Acquired absence of both cervix and uterus: Secondary | ICD-10-CM | POA: Insufficient documentation

## 2018-11-23 DIAGNOSIS — C786 Secondary malignant neoplasm of retroperitoneum and peritoneum: Secondary | ICD-10-CM | POA: Diagnosis not present

## 2018-11-23 DIAGNOSIS — Z79899 Other long term (current) drug therapy: Secondary | ICD-10-CM | POA: Diagnosis not present

## 2018-11-23 DIAGNOSIS — Z7982 Long term (current) use of aspirin: Secondary | ICD-10-CM | POA: Diagnosis not present

## 2018-11-23 DIAGNOSIS — F209 Schizophrenia, unspecified: Secondary | ICD-10-CM | POA: Insufficient documentation

## 2018-11-23 DIAGNOSIS — Z9079 Acquired absence of other genital organ(s): Secondary | ICD-10-CM | POA: Diagnosis not present

## 2018-11-23 DIAGNOSIS — Z9221 Personal history of antineoplastic chemotherapy: Secondary | ICD-10-CM | POA: Diagnosis not present

## 2018-11-23 DIAGNOSIS — Z90722 Acquired absence of ovaries, bilateral: Secondary | ICD-10-CM | POA: Diagnosis not present

## 2018-11-23 DIAGNOSIS — C562 Malignant neoplasm of left ovary: Secondary | ICD-10-CM | POA: Diagnosis present

## 2018-11-23 MED ORDER — TRAMADOL HCL 50 MG PO TABS: ORAL_TABLET | ORAL | 0 refills | Status: DC

## 2018-11-23 NOTE — Patient Instructions (Addendum)
[place] at [facility] Discharge Instructions  You were seen today by Dr. Delton Coombes, he went over how you've been feeling and if you are having any side effects from your medication. He reviewed your recent scan results with you, your blood work looked today. Your scans were good as well. He will do another scan in 4 months. He will see you back in 2 months for labs and follow up.   Thank you for choosing [place] at [facility] to provide your oncology and hematology care.  To afford each patient quality time with our provider, please arrive at least 15 minutes before your scheduled appointment time.   If you have a lab appointment with the [facility] please come in thru the  Main Entrance and check in at the main information desk  You need to re-schedule your appointment should you arrive 10 or more minutes late.  We strive to give you quality time with our providers, and arriving late affects you and other patients whose appointments are after yours.  Also, if you no show three or more times for appointments you may be dismissed from the clinic at the providers discretion.     Again, thank you for choosing [facility].  Our hope is that these requests will decrease the amount of time that you wait before being seen by our physicians.       _____________________________________________________________  Should you have questions after your visit to [facility], please contact our office at [phone] between the hours of 8:00 a.m. and 4:30 p.m.  Voicemails left after 4:00 p.m. will not be returned until the following business day.  For prescription refill requests, have your pharmacy contact our office and allow 72 hours.    Cancer Center Support Programs:   > Cancer Support Group  2nd Tuesday of the month 1pm-2pm, Journey Room

## 2018-11-23 NOTE — Assessment & Plan Note (Addendum)
1.  Stage IVb ovarian cancer: - Had a history of mediastinal lymphadenopathy and malignant ascites. - She underwent 3 cycles of carboplatin and paclitaxel followed by debulking surgery by Dr. Denman George on 07/28/2015, followed by 3 more cycles finished on 10/07/2015. -Status post 6 cycles of carboplatin and paclitaxel from 08/17/2017 through 11/30/2017 in the second line setting. -Germline mutation testing for BRCA negative. -She was subsequently placed on niraparib 300 mg daily maintenance since April 2019.  She was recommended to be on niraparib until March/April 2021 by Dr. Denman George. -She is tolerating niraparib very well.  She is able to do all her ADLs and IADLs.  No new pains were reported. - We reviewed her blood work.  C A1 25 is within normal limits.  Mildly elevated creatinine is stable. - CT CAP on 11/15/2018 shows no new or progressive interval findings. - She is doing very well on niraparib.  I will see her back in 2 months for follow-up with repeat blood work.  I plan to repeat scans in 4 months.  2.  Pain: -She is taking tramadol 50 mg every 6 hours as needed.  We have refilled her prescription.  3.  Hypertension: -Her blood pressure today is 150/71. -She will continue lisinopril 40 mg daily and metoprolol 25 mg twice daily.

## 2018-11-23 NOTE — Progress Notes (Signed)
[address] [place], [place] [address]   CLINIC:  Medical Oncology/Hematology  PCP:  Patient, No Pcp Per No address on file None   REASON FOR VISIT:  Follow-up for metastatic ovarian cancer.  CURRENT THERAPY: Niraparib 300 mg daily.  BRIEF ONCOLOGIC HISTORY:    Ovarian cancer ([place])   04/25/2015 - 04/28/2015 Hospital Admission    Nausea/diarrhea.  Oncology consult completed on 04/27/2015.    04/25/2015 Tumor Marker    CA 125- 3902.      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    04/27/2015 Procedure    US Paracentesis- A total of approximately 3700 mL of amber colored fluid was removed. A fluid sample was sent for laboratory analysis.    04/27/2015 Imaging    CT Chest- Mild left supraclavicular lymphadenopathy and moderate mediastinal lymphadenopathy involving the prevascular, subcarinal and bilateral pericardiophrenic nodal chains, likely metastatic.    04/27/2015 Pathology Results    Diagnosis PERITONEAL/ASCITIC FLUID(SPECIMEN 1 OF 1 COLLECTED 04/27/15): MALIGNANT CELLS CONSISTENT WITH METASTATIC ADENOCARCINOMA.  The immunophenotype is most consistent with a gynecologic primary, most likely ovary.    05/08/2015 Miscellaneous    Seen by Dr. Denman George- recommending Carboplatin/Paclitaxel x 3 cycles and return visit to see her (within 1 week of administration of third cycle) to evaluate for optimal sequencing of treatment modalities.    05/13/2015 - 05/15/2015 Hospital Admission    Hospitalized for AKI    05/19/2015 - 07/01/2015 Chemotherapy    Carboplatin/Paclitaxel x 3 cycles    07/28/2015 Surgery    Exploratory laparotomy with total abdominal hysterectomy, bilateral salpingo-oophorectomy, omentectomy radical tumor debulking for ovarian cancer; by Dr. Everitt Amber.    07/28/2015 Pathology Results    Uterus  +/- tubes/ovaries, neoplastic, cervix - HIGH GRADE SEROUS CARCINOMA INVOLVING LEFT OVARY, LEFT FALLOPIAN TUBE AND RIGHT FALLOPIAN TUBE. - CERVIX, ENDOMETRIUM AND MYOMETRIUM ARE FREE OF TUMOR. 2. Cul-de-sac biop...    08/26/2015 -  Chemotherapy    Carboplatin/Paclitaxel x 3 cycles    12/23/2015 Miscellaneous    Genetic counseling.  Genetic testing was normal, and did not reveal a deleterious mutation in these genes    03/07/2017 Imaging    CT C/A/P IMPRESSION: Chest Impression:  No evidence of metastatic disease in thorax.  Abdomen / Pelvis Impression:  1. Interval increase in size of solitary nodular peritoneal implant in the LEFT upper quadrant . 2. No additional evidence of peritoneal nodularity. 3. No ascites.    07/31/2017 Progression    CT C/A/P: IMPRESSION: 1. Left upper quadrant nodule continues to progress, now measuring 20 x 26 mm. 2. New nodule identified between the in adrenal gland, concerning for metastatic deposit. 3. Interval development of tiny nodules in the left lower quadrant mesenteric, concerning for metastases. 4. No ascites. 5. Status post total abdominal hysterectomy and omentectomy.      CANCER STAGING: Cancer Staging Ovarian cancer [facility]) Staging form: Ovary, AJCC 7th Edition - Clinical stage from 04/27/2015: FIGO Stage IVB, calculated as Stage IV (T3c, N1, M1) - Signed by Baird Cancer, PA-C on 09/16/2015 - Pathologic stage from 05/08/2015: FIGO Stage IVB, calculated as Stage IV (TX, N1, M1) - Signed by Everitt Amber, MD on 05/08/2015 - Pathologic stage from 07/28/2015: FIGO Stage IV (T3c, NX, M1) - Signed by Baird Cancer, PA-C on 09/16/2015    INTERVAL HISTORY:  Ms. Teigen 78 y.o. female returns for follow-up of stage IV ovarian cancer.  She is taking niraparib 300 mg daily.  She is tolerating it very well.  Denies any nausea, vomiting, diarrhea or abdominal pains.  She had a CT scan done on 11/15/2018.  Her appetite and energy levels are  100%.  She requests refill for tramadol.  Denies any recent infections or hospitalizations or ER visits.  Appetite has been good.  She is able to do all her activities in and around the house.    REVIEW OF SYSTEMS:  Review of Systems - Oncology   PAST MEDICAL/SURGICAL HISTORY:  Past Medical History:  Diagnosis Date  . Arthritis   . B12 deficiency   . Cataract   . History of blood transfusion   . History of chemotherapy   . Hypertension   . Iron deficiency anemia   . Mixed hyperlipidemia   . Ovarian cancer ([place])   . 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 Schizophrenia ([place])   . Ulcerative colitis ([place])   . Vitamin D deficiency    Past Surgical History:  Procedure Laterality Date  . ABDOMINAL HYSTERECTOMY N/A 07/28/2015   Procedure: TOTAL HYSTERECTOMY ABDOMINAL BILATERAL SALPINGO OOPHRORECTOMY;  Surgeon: Everitt Amber, MD;  Location: [facility];  Service: Gynecology;  Laterality: N/A;  . DEBULKING N/A 07/28/2015   Procedure: DEBULKING;  Surgeon: Everitt Amber, MD;  Location: [facility];  Service: Gynecology;  Laterality: N/A;  . LAPAROTOMY N/A 07/28/2015   Procedure: EXPLORATORY LAPAROTOMY;  Surgeon: Everitt Amber, MD;  Location: [facility];  Service: Gynecology;  Laterality: N/A;  . OMENTECTOMY N/A 07/28/2015   Procedure: OMENTECTOMY ;  Surgeon: Everitt Amber, MD;  Location: [facility];  Service: Gynecology;  Laterality: N/A;  . PARACENTESIS  04/27/15  . PORTACATH PLACEMENT Right 04/2015  . REPLACEMENT TOTAL KNEE     right knee in 2003     SOCIAL HISTORY:  Social History   Socioeconomic History  . Marital status: Single    Spouse name: Not on file  . Number of children: 1  . Years of education: Not on file  . Highest education level: Not on file  Occupational History  . Not on file  Social Needs  . Financial resource strain: Not on file  . Food insecurity:    Worry: Not on file    Inability: Not on file  . Transportation needs:    Medical: Not on file    Non-medical: Not on file  Tobacco Use  .  Smoking status: Never Smoker  . Smokeless tobacco: Never Used  Substance and Sexual Activity  . Alcohol use: No  . Drug use: No  . Sexual activity: Not Currently  Lifestyle  . Physical activity:    Days per week: Not on file    Minutes per session: Not on file  . Stress: Not on file  Relationships  . Social connections:    Talks on phone: Not on file    Gets together: Not on file    Attends religious service: Not on file    Active member of club or organization: Not on file    Attends meetings of clubs or organizations: Not on file    Relationship status: Not on file  . Intimate partner violence:    Fear of current or ex partner: Not on file    Emotionally abused: Not on file    Physically abused: Not on file    Forced sexual activity: Not on file  Other Topics  Concern  . Not on file  Social History Narrative  . Not on file    FAMILY HISTORY:  Family History  Problem Relation Age of Onset  . Prostate cancer Brother   . Cancer Paternal Uncle        1 uncle with cancer NOS  . Lung cancer Maternal Grandmother   . Hypertension Maternal Grandfather   . Congestive Heart Failure Mother   . Seizures Sister     CURRENT MEDICATIONS:  Outpatient Encounter Medications as of 11/23/2018  Medication Sig Note  . acetaminophen (TYLENOL) 500 MG tablet Take 2 tablets (1,000 mg total) by mouth every 12 (twelve) hours.   Marland Kitchen amLODipine (NORVASC) 10 MG tablet Take 10 mg by mouth every evening.    Marland Kitchen aspirin EC 81 MG tablet Take 81 mg by mouth daily. 10/07/2015: taking  . Cholecalciferol (VITAMIN D-3) 1000 UNITS CAPS Take 1,000 Units by mouth daily.    Marland Kitchen docusate sodium (COLACE) 100 MG capsule Take 100 mg by mouth 2 (two) times daily.   . folic acid (FOLVITE) 1 MG tablet Take 1 mg by mouth daily.   Marland Kitchen lidocaine-prilocaine (EMLA) cream Apply to affected area once   . lisinopril (PRINIVIL,ZESTRIL) 40 MG tablet Take 40 mg by mouth daily.    Marland Kitchen lovastatin (MEVACOR) 10 MG tablet Take 10 mg by mouth  at bedtime.   . metoprolol tartrate (LOPRESSOR) 25 MG tablet Take 1 tablet (25 mg total) by mouth 2 (two) times daily.   . mirtazapine (REMERON) 15 MG tablet Take 7.5 mg by mouth at bedtime.   . Multiple Vitamin (MULTIVITAMIN WITH MINERALS) TABS tablet Take 1 tablet by mouth daily.   Alanda Slim Tosylate (ZEJULA) 100 MG CAPS Take 300 mg by mouth daily.   . ondansetron (ZOFRAN) 8 MG tablet Take 1 tablet (8 mg total) by mouth 2 (two) times daily as needed for refractory nausea / vomiting. Start on day 3 after chemo.   Marland Kitchen QUEtiapine (SEROQUEL XR) 300 MG 24 hr tablet Take 300 mg by mouth at bedtime.   . sulfaSALAzine (AZULFIDINE) 500 MG tablet Take 500 mg by mouth 3 (three) times daily.   . traMADol (ULTRAM) 50 MG tablet TAKE (1) TABLET BY MOUTH EVERY SIX HOURS AS NEEDED.   Marland Kitchen vitamin B-12 (CYANOCOBALAMIN) 1000 MCG tablet Take 1,000 mcg by mouth daily.   . [DISCONTINUED] traMADol (ULTRAM) 50 MG tablet TAKE (1) TABLET BY MOUTH EVERY SIX HOURS AS NEEDED.   Marland Kitchen prochlorperazine (COMPAZINE) 10 MG tablet Take 1 tablet (10 mg total) by mouth every 6 (six) hours as needed (Nausea or vomiting). (Patient not taking: Reported on 11/23/2018)    No facility-administered encounter medications on file as of 11/23/2018.     ALLERGIES:  No Known Allergies   PHYSICAL EXAM:  ECOG Performance status: 1  Vitals:   11/23/18 0924  BP: (!) 152/71  Pulse: 75  Resp: 20  Temp: 97.8 F (36.6 C)  SpO2: 98%   Filed Weights   11/23/18 0923 11/23/18 0924  Weight: 167 lb (75.8 kg) 167 lb (75.8 kg)    Physical Exam Constitutional:      Appearance: Normal appearance.  Cardiovascular:     Rate and Rhythm: Normal rate and regular rhythm.     Heart sounds: Normal heart sounds.  Pulmonary:     Effort: Pulmonary effort is normal.     Breath sounds: Normal breath sounds.  Abdominal:     General: Bowel sounds are normal. There is no distension.  Palpations: Abdomen is soft. There is no mass.  Musculoskeletal:         General: No swelling.  Skin:    General: Skin is warm.     Findings: No rash.  Neurological:     Mental Status: She is alert and oriented to person, place, and time.  Psychiatric:        Mood and Affect: Mood normal.        Behavior: Behavior normal.      LABORATORY DATA:  I have reviewed the labs as listed.  CBC    Component Value Date/Time   WBC 4.9 11/15/2018 0828   RBC 3.16 (L) 11/15/2018 0828   HGB 10.9 (L) 11/15/2018 0828   HCT 34.5 (L) 11/15/2018 0828   PLT 305 11/15/2018 0828   MCV 109.2 (H) 11/15/2018 0828   MCH 34.5 (H) 11/15/2018 0828   MCHC 31.6 11/15/2018 0828   RDW 12.9 11/15/2018 0828   LYMPHSABS 1.1 11/15/2018 0828   MONOABS 0.6 11/15/2018 0828   EOSABS 0.2 11/15/2018 0828   BASOSABS 0.0 11/15/2018 0828   CMP Latest Ref Rng & Units 11/15/2018 08/13/2018 07/09/2018  Glucose 70 - 99 mg/dL 109(H) 94 94  BUN 8 - 23 mg/dL _0 Creatinine 0.44 - 1.00 mg/dL 1.14(H) 1.11(H) 1.28(H)  Sodium 135 - 145 mmol/L 138 142 140  Potassium 3.5 - 5.1 mmol/L 3.9 3.7 4.5  Chloride 98 - 111 mmol/L 101 105 102  CO2 22 - 32 mmol/L _1 Calcium 8.9 - 10.3 mg/dL 9.8 9.4 9.9  Total Protein 6.5 - 8.1 g/dL 7.9 7.6 8.1  Total Bilirubin 0.3 - 1.2 mg/dL 0.5 0.5 0.4  Alkaline Phos 38 - 126 U/L 99 79 102  AST 15 - 41 U/L 32 33 29  ALT 0 - 44 U/L _2 DIAGNOSTIC IMAGING:  I have independently reviewed the scans and discussed with the patient.      ASSESSMENT & PLAN:   Ovarian cancer ([place]) 1.  Stage IVb ovarian cancer: - Had a history of mediastinal lymphadenopathy and malignant ascites. - She underwent 3 cycles of carboplatin and paclitaxel followed by debulking surgery by Dr. Denman George on 07/28/2015, followed by 3 more cycles finished on 10/07/2015. -Status post 6 cycles of carboplatin and paclitaxel from 08/17/2017 through 11/30/2017 in the second line setting. -Germline mutation testing for BRCA negative. -She was subsequently placed on niraparib 300 mg  daily maintenance since April 2019.  She was recommended to be on niraparib until March/April 2021 by Dr. Denman George. -She is tolerating niraparib very well.  She is able to do all her ADLs and IADLs.  No new pains were reported. - We reviewed her blood work.  C A1 25 is within normal limits.  Mildly elevated creatinine is stable. - CT CAP on 11/15/2018 shows no new or progressive interval findings. - She is doing very well on niraparib.  I will see her back in 2 months for follow-up with repeat blood work.  I plan to repeat scans in 4 months.  2.  Pain: -She is taking tramadol 50 mg every 6 hours as needed.  We have refilled her prescription.  3.  Hypertension: -Her blood pressure today is 150/71. -She will continue lisinopril 40 mg daily and metoprolol 25 mg twice daily.  Total time spent is 25 minutes with more than 50% of the time spent face-to-face discussing disease prognosis, lab results, CT scan results and coordination of  care.    Orders placed this encounter:  Orders Placed This Encounter  Procedures  . CBC with Differential  . Comprehensive metabolic panel  . [place], [place] [phone]

## 2018-11-23 NOTE — Progress Notes (Signed)
Patient reports no side effects related to the [facility] and reports no missed doses. Patient wants a refill on Tramadol. This was communicated to collaborative nurse Caryl Pina and Dr. Raliegh Ip as well as written on their intake form.

## 2019-01-22 ENCOUNTER — Inpatient Hospital Stay (HOSPITAL_COMMUNITY): Payer: Medicare Other | Attending: Hematology

## 2019-01-22 ENCOUNTER — Other Ambulatory Visit: Payer: Self-pay

## 2019-01-22 DIAGNOSIS — Z9079 Acquired absence of other genital organ(s): Secondary | ICD-10-CM | POA: Insufficient documentation

## 2019-01-22 DIAGNOSIS — I10 Essential (primary) hypertension: Secondary | ICD-10-CM | POA: Insufficient documentation

## 2019-01-22 DIAGNOSIS — Z7982 Long term (current) use of aspirin: Secondary | ICD-10-CM | POA: Insufficient documentation

## 2019-01-22 DIAGNOSIS — C562 Malignant neoplasm of left ovary: Secondary | ICD-10-CM

## 2019-01-22 DIAGNOSIS — Z90710 Acquired absence of both cervix and uterus: Secondary | ICD-10-CM | POA: Insufficient documentation

## 2019-01-22 DIAGNOSIS — C786 Secondary malignant neoplasm of retroperitoneum and peritoneum: Secondary | ICD-10-CM | POA: Diagnosis not present

## 2019-01-22 DIAGNOSIS — Z79899 Other long term (current) drug therapy: Secondary | ICD-10-CM | POA: Insufficient documentation

## 2019-01-22 DIAGNOSIS — Z90722 Acquired absence of ovaries, bilateral: Secondary | ICD-10-CM | POA: Diagnosis not present

## 2019-01-22 DIAGNOSIS — Z9221 Personal history of antineoplastic chemotherapy: Secondary | ICD-10-CM | POA: Insufficient documentation

## 2019-01-22 DIAGNOSIS — F209 Schizophrenia, unspecified: Secondary | ICD-10-CM | POA: Insufficient documentation

## 2019-01-22 LAB — COMPREHENSIVE METABOLIC PANEL
ALT: 28 U/L (ref 0–44)
AST: 31 U/L (ref 15–41)
Albumin: 4.3 g/dL (ref 3.5–5.0)
Alkaline Phosphatase: 84 U/L (ref 38–126)
Anion gap: 6 (ref 5–15)
BUN: 11 mg/dL (ref 8–23)
CO2: 28 mmol/L (ref 22–32)
Calcium: 9.6 mg/dL (ref 8.9–10.3)
Chloride: 108 mmol/L (ref 98–111)
Creatinine, Ser: 1.1 mg/dL — ABNORMAL HIGH (ref 0.44–1.00)
GFR calc Af Amer: 57 mL/min — ABNORMAL LOW (ref >60)
GFR calc non Af Amer: 49 mL/min — ABNORMAL LOW (ref >60)
Glucose, Bld: 96 mg/dL (ref 70–99)
Potassium: 4.5 mmol/L (ref 3.5–5.1)
Sodium: 142 mmol/L (ref 135–145)
Total Bilirubin: 0.4 mg/dL (ref 0.3–1.2)
Total Protein: 7.3 g/dL (ref 6.5–8.1)

## 2019-01-22 LAB — CBC WITH DIFFERENTIAL/PLATELET
Abs Immature Granulocytes: 0.02 10*3/uL (ref 0.00–0.07)
Basophils Absolute: 0.1 10*3/uL (ref 0.0–0.1)
Basophils Relative: 1 %
Eosinophils Absolute: 0.3 10*3/uL (ref 0.0–0.5)
Eosinophils Relative: 5 %
HCT: 31.5 % — ABNORMAL LOW (ref 36.0–46.0)
Hemoglobin: 10.4 g/dL — ABNORMAL LOW (ref 12.0–15.0)
Immature Granulocytes: 0 %
Lymphocytes Relative: 17 %
Lymphs Abs: 0.9 10*3/uL (ref 0.7–4.0)
MCH: 36.7 pg — ABNORMAL HIGH (ref 26.0–34.0)
MCHC: 33 g/dL (ref 30.0–36.0)
MCV: 111.3 fL — ABNORMAL HIGH (ref 80.0–100.0)
Monocytes Absolute: 0.8 10*3/uL (ref 0.1–1.0)
Monocytes Relative: 16 %
Neutro Abs: 3.2 10*3/uL (ref 1.7–7.7)
Neutrophils Relative %: 61 %
Platelets: 300 10*3/uL (ref 150–400)
RBC: 2.83 MIL/uL — ABNORMAL LOW (ref 3.87–5.11)
RDW: 13.2 % (ref 11.5–15.5)
WBC: 5.3 10*3/uL (ref 4.0–10.5)
nRBC: 0.4 % — ABNORMAL HIGH (ref 0.0–0.2)

## 2019-01-23 LAB — CA 125: Cancer Antigen (CA) 125: 6 U/mL (ref 0.0–38.1)

## 2019-01-29 ENCOUNTER — Other Ambulatory Visit (HOSPITAL_COMMUNITY): Payer: Self-pay | Admitting: *Deleted

## 2019-01-29 ENCOUNTER — Inpatient Hospital Stay (HOSPITAL_BASED_OUTPATIENT_CLINIC_OR_DEPARTMENT_OTHER): Payer: Medicare Other | Admitting: Hematology

## 2019-01-29 ENCOUNTER — Encounter (HOSPITAL_COMMUNITY): Payer: Self-pay | Admitting: Hematology

## 2019-01-29 ENCOUNTER — Other Ambulatory Visit: Payer: Self-pay

## 2019-01-29 VITALS — BP 156/81 | HR 72 | Temp 97.9°F | Resp 18 | Wt 166.6 lb

## 2019-01-29 DIAGNOSIS — Z90710 Acquired absence of both cervix and uterus: Secondary | ICD-10-CM

## 2019-01-29 DIAGNOSIS — Z9221 Personal history of antineoplastic chemotherapy: Secondary | ICD-10-CM | POA: Diagnosis not present

## 2019-01-29 DIAGNOSIS — Z90722 Acquired absence of ovaries, bilateral: Secondary | ICD-10-CM

## 2019-01-29 DIAGNOSIS — Z79899 Other long term (current) drug therapy: Secondary | ICD-10-CM

## 2019-01-29 DIAGNOSIS — C786 Secondary malignant neoplasm of retroperitoneum and peritoneum: Secondary | ICD-10-CM

## 2019-01-29 DIAGNOSIS — C562 Malignant neoplasm of left ovary: Secondary | ICD-10-CM | POA: Diagnosis not present

## 2019-01-29 DIAGNOSIS — Z9079 Acquired absence of other genital organ(s): Secondary | ICD-10-CM

## 2019-01-29 MED ORDER — TRAMADOL HCL 50 MG PO TABS: ORAL_TABLET | ORAL | 0 refills | Status: DC

## 2019-01-29 NOTE — Assessment & Plan Note (Signed)
1.  Stage IVb ovarian cancer: - Had a history of mediastinal lymphadenopathy and malignant ascites. - She underwent 3 cycles of carboplatin and paclitaxel followed by debulking surgery by Dr. Denman George on 07/28/2015, followed by 3 more cycles finished on 10/07/2015. -Status post 6 cycles of carboplatin and paclitaxel from 08/17/2017 through 11/30/2017 in the second line setting. -Germline mutation testing for BRCA negative. -She was subsequently placed on niraparib 300 mg daily maintenance since April 2019.  She was recommended to be on niraparib until March/April 2021 by Dr. Denman George. -She is tolerating niraparib very well.  Does not have any GI side effects or skin rashes.  She is able to do all her activities. -No new abdominal pains were reported. -CT CAP on 11/15/2018 did not show any progression.  - She will continue niraparib at this time.  I will see her back in 6 weeks with a repeat CT scan, tumor marker and other labs.  .  2.  Back pain and abdominal pain: -She takes tramadol 50 mg as needed.  We have refilled her prescription.  3.  Hypertension: -Blood pressure is 156/81. -We will continue lisinopril 40 mg daily and metoprolol 25 mg twice daily.

## 2019-01-29 NOTE — Progress Notes (Signed)
[place], [place] [address]   CLINIC:  Medical Oncology/Hematology  PCP:  Patient, No Pcp Per No address on file None   REASON FOR VISIT:  Follow-up for ovarian cancer   BRIEF ONCOLOGIC HISTORY:    Ovarian cancer ([place])   04/25/2015 - 04/28/2015 Hospital Admission    Nausea/diarrhea.  Oncology consult completed on 04/27/2015.    04/25/2015 Tumor Marker    CA 125- 3902.      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    04/27/2015 Procedure    US Paracentesis- A total of approximately 3700 mL of amber colored fluid was removed. A fluid sample was sent for laboratory analysis.    04/27/2015 Imaging    CT Chest- Mild left supraclavicular lymphadenopathy and moderate mediastinal lymphadenopathy involving the prevascular, subcarinal and bilateral pericardiophrenic nodal chains, likely metastatic.    04/27/2015 Pathology Results    Diagnosis PERITONEAL/ASCITIC FLUID(SPECIMEN 1 OF 1 COLLECTED 04/27/15): MALIGNANT CELLS CONSISTENT WITH METASTATIC ADENOCARCINOMA.  The immunophenotype is most consistent with a gynecologic primary, most likely ovary.    05/08/2015 Miscellaneous    Seen by Dr. Denman George- recommending Carboplatin/Paclitaxel x 3 cycles and return visit to see her (within 1 week of administration of third cycle) to evaluate for optimal sequencing of treatment modalities.    05/13/2015 - 05/15/2015 Hospital Admission    Hospitalized for AKI    05/19/2015 - 07/01/2015 Chemotherapy    Carboplatin/Paclitaxel x 3 cycles    07/28/2015 Surgery    Exploratory laparotomy with total abdominal hysterectomy, bilateral salpingo-oophorectomy, omentectomy radical tumor debulking for ovarian cancer; by Dr. Everitt Amber.    07/28/2015 Pathology Results    Uterus +/- tubes/ovaries, neoplastic, cervix - HIGH GRADE  SEROUS CARCINOMA INVOLVING LEFT OVARY, LEFT FALLOPIAN TUBE AND RIGHT FALLOPIAN TUBE. - CERVIX, ENDOMETRIUM AND MYOMETRIUM ARE FREE OF TUMOR. 2. Cul-de-sac biop...    08/26/2015 -  Chemotherapy    Carboplatin/Paclitaxel x 3 cycles    12/23/2015 Miscellaneous    Genetic counseling.  Genetic testing was normal, and did not reveal a deleterious mutation in these genes    03/07/2017 Imaging    CT C/A/P IMPRESSION: Chest Impression:  No evidence of metastatic disease in thorax.  Abdomen / Pelvis Impression:  1. Interval increase in size of solitary nodular peritoneal implant in the LEFT upper quadrant . 2. No additional evidence of peritoneal nodularity. 3. No ascites.    07/31/2017 Progression    CT C/A/P: IMPRESSION: 1. Left upper quadrant nodule continues to progress, now measuring 20 x 26 mm. 2. New nodule identified between the in adrenal gland, concerning for metastatic deposit. 3. Interval development of tiny nodules in the left lower quadrant mesenteric, concerning for metastases. 4. No ascites. 5. Status post total abdominal hysterectomy and omentectomy.      CANCER STAGING: Cancer Staging Ovarian cancer [facility]) Staging form: Ovary, AJCC 7th Edition - Clinical stage from 04/27/2015: FIGO Stage IVB, calculated as Stage IV (T3c, N1, M1) - Signed by Baird Cancer, PA-C on 09/16/2015 - Pathologic stage from 05/08/2015: FIGO Stage IVB, calculated as Stage IV (TX, N1, M1) - Signed by Everitt Amber, MD on 05/08/2015 - Pathologic stage from 07/28/2015: FIGO Stage IV (T3c, NX, M1) - Signed by Baird Cancer, PA-C on 09/16/2015    INTERVAL HISTORY:  Latasha [name] 78 y.o. female returns  for routine follow-up. She is here today alone. She states that she has been doing well since her last visit. Denies any nausea, vomiting, or diarrhea. Denies any new pains. Had not noticed any recent bleeding such as epistaxis, hematuria or hematochezia. Denies recent chest pain on exertion,  shortness of breath on minimal exertion, pre-syncopal episodes, or palpitations. Denies any numbness or tingling in hands or feet. Denies any recent fevers, infections, or recent hospitalizations. Patient reports appetite at 100% and energy level at 100%.    REVIEW OF SYSTEMS:  Review of Systems  All other systems reviewed and are negative.    PAST MEDICAL/SURGICAL HISTORY:  Past Medical History:  Diagnosis Date  . Arthritis   . B12 deficiency   . Cataract   . History of blood transfusion   . History of chemotherapy   . Hypertension   . Iron deficiency anemia   . Mixed hyperlipidemia   . Ovarian cancer ([place])   . 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 Schizophrenia ([place])   . Ulcerative colitis ([place])   . Vitamin D deficiency    Past Surgical History:  Procedure Laterality Date  . ABDOMINAL HYSTERECTOMY N/A 07/28/2015   Procedure: TOTAL HYSTERECTOMY ABDOMINAL BILATERAL SALPINGO OOPHRORECTOMY;  Surgeon: Everitt Amber, MD;  Location: [facility];  Service: Gynecology;  Laterality: N/A;  . DEBULKING N/A 07/28/2015   Procedure: DEBULKING;  Surgeon: Everitt Amber, MD;  Location: [facility];  Service: Gynecology;  Laterality: N/A;  . LAPAROTOMY N/A 07/28/2015   Procedure: EXPLORATORY LAPAROTOMY;  Surgeon: Everitt Amber, MD;  Location: [facility];  Service: Gynecology;  Laterality: N/A;  . OMENTECTOMY N/A 07/28/2015   Procedure: OMENTECTOMY ;  Surgeon: Everitt Amber, MD;  Location: [facility];  Service: Gynecology;  Laterality: N/A;  . PARACENTESIS  04/27/15  . PORTACATH PLACEMENT Right 04/2015  . REPLACEMENT TOTAL KNEE     right knee in 2003     SOCIAL HISTORY:  Social History   Socioeconomic History  . Marital status: Single    Spouse name: Not on file  . Number of children: 1  . Years of education: Not on file  . Highest education level: Not on file  Occupational History  . Not on file  Social Needs  . Financial resource strain: Not on file  . Food insecurity:    Worry: Not on file    Inability: Not on file   . Transportation needs:    Medical: Not on file    Non-medical: Not on file  Tobacco Use  . Smoking status: Never Smoker  . Smokeless tobacco: Never Used  Substance and Sexual Activity  . Alcohol use: No  . Drug use: No  . Sexual activity: Not Currently  Lifestyle  . Physical activity:    Days per week: Not on file    Minutes per session: Not on file  . Stress: Not on file  Relationships  . Social connections:    Talks on phone: Not on file    Gets together: Not on file    Attends religious service: Not on file    Active member of club or organization: Not on file    Attends meetings of clubs or organizations: Not on file    Relationship status: Not on file  . Intimate partner violence:    Fear of current or ex partner: Not on file    Emotionally abused: Not on file    Physically abused: Not on file    Forced sexual activity: Not on file  Other Topics Concern  . Not on file  Social History Narrative  . Not on file    FAMILY HISTORY:  Family History  Problem Relation Age of Onset  . Prostate cancer Brother   . Cancer Paternal Uncle        1 uncle with cancer NOS  . Lung cancer Maternal Grandmother   . Hypertension Maternal Grandfather   . Congestive Heart Failure Mother   . Seizures Sister     CURRENT MEDICATIONS:  Outpatient Encounter Medications as of 01/29/2019  Medication Sig Note  . acetaminophen (TYLENOL) 500 MG tablet Take 2 tablets (1,000 mg total) by mouth every 12 (twelve) hours.   Marland Kitchen amLODipine (NORVASC) 10 MG tablet Take 10 mg by mouth every evening.    Marland Kitchen aspirin EC 81 MG tablet Take 81 mg by mouth daily. 10/07/2015: taking  . Cholecalciferol (VITAMIN D-3) 1000 UNITS CAPS Take 1,000 Units by mouth daily.    Marland Kitchen docusate sodium (COLACE) 100 MG capsule Take 100 mg by mouth 2 (two) times daily.   . folic acid (FOLVITE) 1 MG tablet Take 1 mg by mouth daily.   Marland Kitchen lidocaine-prilocaine (EMLA) cream Apply to affected area once   . lisinopril (PRINIVIL,ZESTRIL) 40  MG tablet Take 40 mg by mouth daily.    Marland Kitchen lovastatin (MEVACOR) 10 MG tablet Take 10 mg by mouth at bedtime.   . metoprolol tartrate (LOPRESSOR) 25 MG tablet Take 1 tablet (25 mg total) by mouth 2 (two) times daily.   . mirtazapine (REMERON) 15 MG tablet Take 7.5 mg by mouth at bedtime.   . Multiple Vitamin (MULTIVITAMIN WITH MINERALS) TABS tablet Take 1 tablet by mouth daily.   Alanda Slim Tosylate (ZEJULA) 100 MG CAPS Take 300 mg by mouth daily.   . prochlorperazine (COMPAZINE) 10 MG tablet Take 1 tablet (10 mg total) by mouth every 6 (six) hours as needed (Nausea or vomiting).   . QUEtiapine (SEROQUEL XR) 300 MG 24 hr tablet Take 300 mg by mouth at bedtime.   . sulfaSALAzine (AZULFIDINE) 500 MG tablet Take 500 mg by mouth 3 (three) times daily.   . vitamin B-12 (CYANOCOBALAMIN) 1000 MCG tablet Take 1,000 mcg by mouth daily.   . [DISCONTINUED] traMADol (ULTRAM) 50 MG tablet TAKE (1) TABLET BY MOUTH EVERY SIX HOURS AS NEEDED.   Marland Kitchen ondansetron (ZOFRAN) 8 MG tablet Take 1 tablet (8 mg total) by mouth 2 (two) times daily as needed for refractory nausea / vomiting. Start on day 3 after chemo.    No facility-administered encounter medications on file as of 01/29/2019.     ALLERGIES:  No Known Allergies   PHYSICAL EXAM:  ECOG Performance status: 1  Vitals:   01/29/19 1108  BP: (!) 156/81  Pulse: 72  Resp: 18  Temp: 97.9 F (36.6 C)  SpO2: 94%   Filed Weights   01/29/19 1108  Weight: 166 lb 9.6 oz (75.6 kg)    Physical Exam Vitals signs reviewed.  Constitutional:      Appearance: Normal appearance.  Cardiovascular:     Rate and Rhythm: Normal rate and regular rhythm.     Heart sounds: Normal heart sounds.  Pulmonary:     Effort: Pulmonary effort is normal.     Breath sounds: Normal breath sounds.  Abdominal:     General: There is no distension.     Palpations: Abdomen is soft. There is no mass.  Musculoskeletal:        General: No swelling.  Skin:  General: Skin is warm.   Neurological:     General: No focal deficit present.     Mental Status: She is alert.  Psychiatric:        Mood and Affect: Mood normal.        Behavior: Behavior normal.      LABORATORY DATA:  I have reviewed the labs as listed.  CBC    Component Value Date/Time   WBC 5.3 01/22/2019 1039   RBC 2.83 (L) 01/22/2019 1039   HGB 10.4 (L) 01/22/2019 1039   HCT 31.5 (L) 01/22/2019 1039   PLT 300 01/22/2019 1039   MCV 111.3 (H) 01/22/2019 1039   MCH 36.7 (H) 01/22/2019 1039   MCHC 33.0 01/22/2019 1039   RDW 13.2 01/22/2019 1039   LYMPHSABS 0.9 01/22/2019 1039   MONOABS 0.8 01/22/2019 1039   EOSABS 0.3 01/22/2019 1039   BASOSABS 0.1 01/22/2019 1039   CMP Latest Ref Rng & Units 01/22/2019 11/15/2018 08/13/2018  Glucose 70 - 99 mg/dL 96 109(H) 94  BUN 8 - 23 mg/dL 11 9 14   Creatinine 0.44 - 1.00 mg/dL 1.10(H) 1.14(H) 1.11(H)  Sodium 135 - 145 mmol/L 142 138 142  Potassium 3.5 - 5.1 mmol/L 4.5 3.9 3.7  Chloride 98 - 111 mmol/L 108 101 105  CO2 22 - 32 mmol/L 28 27 29   Calcium 8.9 - 10.3 mg/dL 9.6 9.8 9.4  Total Protein 6.5 - 8.1 g/dL 7.3 7.9 7.6  Total Bilirubin 0.3 - 1.2 mg/dL 0.4 0.5 0.5  Alkaline Phos 38 - 126 U/L 84 99 79  AST 15 - 41 U/L 31 32 33  ALT 0 - 44 U/L 28 28 27        DIAGNOSTIC IMAGING:  I have independently reviewed the scans and discussed with the patient.   I have reviewed Venita Lick LPN's note and agree with the documentation.  I personally performed a face-to-face visit, made revisions and my assessment and plan is as follows.    ASSESSMENT & PLAN:   Ovarian cancer ([place]) 1.  Stage IVb ovarian cancer: - Had a history of mediastinal lymphadenopathy and malignant ascites. - She underwent 3 cycles of carboplatin and paclitaxel followed by debulking surgery by Dr. Denman George on 07/28/2015, followed by 3 more cycles finished on 10/07/2015. -Status post 6 cycles of carboplatin and paclitaxel from 08/17/2017 through 11/30/2017 in the second line setting.  -Germline mutation testing for BRCA negative. -She was subsequently placed on niraparib 300 mg daily maintenance since April 2019.  She was recommended to be on niraparib until March/April 2021 by Dr. Denman George. -She is tolerating niraparib very well.  Does not have any GI side effects or skin rashes.  She is able to do all her activities. -No new abdominal pains were reported. -CT CAP on 11/15/2018 did not show any progression.  - She will continue niraparib at this time.  I will see her back in 6 weeks with a repeat CT scan, tumor marker and other labs.  .  2.  Back pain and abdominal pain: -She takes tramadol 50 mg as needed.  We have refilled her prescription.  3.  Hypertension: -Blood pressure is 156/81. -We will continue lisinopril 40 mg daily and metoprolol 25 mg twice daily.    Total time spent is 25 minutes with more than 50% of the time spent face-to-face discussing treatment plan and coordination of care.  Orders placed this encounter:  Orders Placed This Encounter  Procedures  . CT Abdomen Pelvis W Contrast  .  CT Chest W Contrast  . CA 125  . CBC with Differential/Platelet  . Comprehensive metabolic panel      Derek Jack, MD [place] [phone]

## 2019-02-07 ENCOUNTER — Other Ambulatory Visit (HOSPITAL_COMMUNITY): Payer: Self-pay | Admitting: *Deleted

## 2019-02-07 ENCOUNTER — Other Ambulatory Visit (HOSPITAL_COMMUNITY): Payer: Self-pay | Admitting: Internal Medicine

## 2019-02-07 DIAGNOSIS — C562 Malignant neoplasm of left ovary: Secondary | ICD-10-CM

## 2019-02-07 MED ORDER — NIRAPARIB TOSYLATE 100 MG PO CAPS: 300.0000 mg | ORAL_CAPSULE | Freq: Every day | ORAL | 3 refills | Status: DC

## 2019-03-11 ENCOUNTER — Ambulatory Visit (HOSPITAL_COMMUNITY)
Admission: RE | Admit: 2019-03-11 | Discharge: 2019-03-11 | Disposition: A | Discharge status: Home / Self Care | Payer: Medicare Other | Source: Ambulatory Visit | Attending: Hematology | Admitting: Hematology

## 2019-03-11 ENCOUNTER — Other Ambulatory Visit: Payer: Self-pay

## 2019-03-11 ENCOUNTER — Inpatient Hospital Stay (HOSPITAL_COMMUNITY): Payer: Medicare Other | Attending: Hematology

## 2019-03-11 DIAGNOSIS — R109 Unspecified abdominal pain: Secondary | ICD-10-CM | POA: Insufficient documentation

## 2019-03-11 DIAGNOSIS — Z9221 Personal history of antineoplastic chemotherapy: Secondary | ICD-10-CM | POA: Insufficient documentation

## 2019-03-11 DIAGNOSIS — Z90710 Acquired absence of both cervix and uterus: Secondary | ICD-10-CM | POA: Diagnosis not present

## 2019-03-11 DIAGNOSIS — C562 Malignant neoplasm of left ovary: Secondary | ICD-10-CM | POA: Insufficient documentation

## 2019-03-11 DIAGNOSIS — I10 Essential (primary) hypertension: Secondary | ICD-10-CM | POA: Insufficient documentation

## 2019-03-11 DIAGNOSIS — Z79899 Other long term (current) drug therapy: Secondary | ICD-10-CM | POA: Insufficient documentation

## 2019-03-11 DIAGNOSIS — E559 Vitamin D deficiency, unspecified: Secondary | ICD-10-CM | POA: Insufficient documentation

## 2019-03-11 DIAGNOSIS — F209 Schizophrenia, unspecified: Secondary | ICD-10-CM | POA: Diagnosis not present

## 2019-03-11 DIAGNOSIS — E782 Mixed hyperlipidemia: Secondary | ICD-10-CM | POA: Diagnosis not present

## 2019-03-11 DIAGNOSIS — M549 Dorsalgia, unspecified: Secondary | ICD-10-CM | POA: Insufficient documentation

## 2019-03-11 DIAGNOSIS — Z7982 Long term (current) use of aspirin: Secondary | ICD-10-CM | POA: Diagnosis not present

## 2019-03-11 LAB — COMPREHENSIVE METABOLIC PANEL
ALT: 29 U/L (ref 0–44)
AST: 33 U/L (ref 15–41)
Albumin: 4.6 g/dL (ref 3.5–5.0)
Alkaline Phosphatase: 87 U/L (ref 38–126)
Anion gap: 10 (ref 5–15)
BUN: 14 mg/dL (ref 8–23)
CO2: 27 mmol/L (ref 22–32)
Calcium: 9.7 mg/dL (ref 8.9–10.3)
Chloride: 102 mmol/L (ref 98–111)
Creatinine, Ser: 1.32 mg/dL — ABNORMAL HIGH (ref 0.44–1.00)
GFR calc Af Amer: 46 mL/min — ABNORMAL LOW (ref >60)
GFR calc non Af Amer: 39 mL/min — ABNORMAL LOW (ref >60)
Glucose, Bld: 104 mg/dL — ABNORMAL HIGH (ref 70–99)
Potassium: 4.4 mmol/L (ref 3.5–5.1)
Sodium: 139 mmol/L (ref 135–145)
Total Bilirubin: 0.6 mg/dL (ref 0.3–1.2)
Total Protein: 7.8 g/dL (ref 6.5–8.1)

## 2019-03-11 LAB — CBC WITH DIFFERENTIAL/PLATELET
Abs Immature Granulocytes: 0.03 10*3/uL (ref 0.00–0.07)
Basophils Absolute: 0.1 10*3/uL (ref 0.0–0.1)
Basophils Relative: 1 %
Eosinophils Absolute: 0.3 10*3/uL (ref 0.0–0.5)
Eosinophils Relative: 5 %
HCT: 32.9 % — ABNORMAL LOW (ref 36.0–46.0)
Hemoglobin: 10.7 g/dL — ABNORMAL LOW (ref 12.0–15.0)
Immature Granulocytes: 1 %
Lymphocytes Relative: 24 %
Lymphs Abs: 1.3 10*3/uL (ref 0.7–4.0)
MCH: 36.3 pg — ABNORMAL HIGH (ref 26.0–34.0)
MCHC: 32.5 g/dL (ref 30.0–36.0)
MCV: 111.5 fL — ABNORMAL HIGH (ref 80.0–100.0)
Monocytes Absolute: 0.8 10*3/uL (ref 0.1–1.0)
Monocytes Relative: 16 %
Neutro Abs: 2.8 10*3/uL (ref 1.7–7.7)
Neutrophils Relative %: 53 %
Platelets: 275 10*3/uL (ref 150–400)
RBC: 2.95 MIL/uL — ABNORMAL LOW (ref 3.87–5.11)
RDW: 13.2 % (ref 11.5–15.5)
WBC: 5.2 10*3/uL (ref 4.0–10.5)
nRBC: 0 % (ref 0.0–0.2)

## 2019-03-11 MED ORDER — IOHEXOL 300 MG/ML  SOLN
100.0000 mL | Freq: Once | INTRAMUSCULAR | Status: AC | PRN
  Administered 2019-03-11: 80 mL via INTRAVENOUS

## 2019-03-12 LAB — CA 125: Cancer Antigen (CA) 125: 6.7 U/mL (ref 0.0–38.1)

## 2019-03-13 ENCOUNTER — Encounter (HOSPITAL_COMMUNITY): Payer: Self-pay | Admitting: Hematology

## 2019-03-13 ENCOUNTER — Other Ambulatory Visit: Payer: Self-pay

## 2019-03-13 ENCOUNTER — Inpatient Hospital Stay (HOSPITAL_BASED_OUTPATIENT_CLINIC_OR_DEPARTMENT_OTHER): Payer: Medicare Other | Admitting: Hematology

## 2019-03-13 DIAGNOSIS — C569 Malignant neoplasm of unspecified ovary: Secondary | ICD-10-CM

## 2019-03-13 DIAGNOSIS — C562 Malignant neoplasm of left ovary: Secondary | ICD-10-CM | POA: Diagnosis not present

## 2019-03-13 DIAGNOSIS — R109 Unspecified abdominal pain: Secondary | ICD-10-CM | POA: Diagnosis not present

## 2019-03-13 DIAGNOSIS — E782 Mixed hyperlipidemia: Secondary | ICD-10-CM

## 2019-03-13 DIAGNOSIS — Z90710 Acquired absence of both cervix and uterus: Secondary | ICD-10-CM

## 2019-03-13 DIAGNOSIS — M549 Dorsalgia, unspecified: Secondary | ICD-10-CM

## 2019-03-13 DIAGNOSIS — Z7982 Long term (current) use of aspirin: Secondary | ICD-10-CM

## 2019-03-13 DIAGNOSIS — Z9221 Personal history of antineoplastic chemotherapy: Secondary | ICD-10-CM

## 2019-03-13 DIAGNOSIS — I10 Essential (primary) hypertension: Secondary | ICD-10-CM

## 2019-03-13 DIAGNOSIS — F209 Schizophrenia, unspecified: Secondary | ICD-10-CM

## 2019-03-13 DIAGNOSIS — E559 Vitamin D deficiency, unspecified: Secondary | ICD-10-CM

## 2019-03-13 DIAGNOSIS — Z79899 Other long term (current) drug therapy: Secondary | ICD-10-CM

## 2019-03-13 NOTE — Assessment & Plan Note (Addendum)
1.  Stage IVb ovarian cancer: - Had a history of mediastinal lymphadenopathy and malignant ascites. - She underwent 3 cycles of carboplatin and paclitaxel followed by debulking surgery by Dr. Rossi on 07/28/2015, followed by 3 more cycles finished on 10/07/2015. -Status post 6 cycles of carboplatin and paclitaxel from 08/17/2017 through 11/30/2017 in the second line setting. -Germline mutation testing for BRCA was negative. -She was started on niraparib 300 mg daily maintenance since April 2019.  She was recommended to be on niraparib until March/April 2021 by Dr. Rossi.  -CT CAP on 03/11/2019 did not show any progression.  -Ca1 25 results within normal limits.  She has mild elevated creatinine likely from neratinib. -She is continuing to tolerate it very well.  I plan to see her back in 2 months with repeat blood works and tumor marker.  2.  Back pain and abdominal pain: -Takes tramadol 50 mg as needed.  3.  Hypertension: -Blood pressure today is 139/70. -We will continue lisinopril 40 mg daily and metoprolol 25 mg twice daily.   

## 2019-03-13 NOTE — Patient Instructions (Signed)
[place] at [facility]  Discharge Instructions:  You saw Dr. Delton Coombes today. _______________________________________________________________  Thank you for choosing [place] at [facility] to provide your oncology and hematology care.  To afford each patient quality time with our providers, please arrive at least 15 minutes before your scheduled appointment.  You need to re-schedule your appointment if you arrive 10 or more minutes late.  We strive to give you quality time with our providers, and arriving late affects you and other patients whose appointments are after yours.  Also, if you no show three or more times for appointments you may be dismissed from the clinic.  Again, thank you for choosing [place] at [place] hope is that these requests will allow you access to exceptional care and in a timely manner. _______________________________________________________________  If you have questions after your visit, please contact our office at [phone] between the hours of 8:30 a.m. and 5:00 p.m. Voicemails left after 4:30 p.m. will not be returned until the following business day. _______________________________________________________________  For prescription refill requests, have your pharmacy contact our office. _______________________________________________________________  Recommendations made by the consultant and any test results will be sent to your referring physician. _______________________________________________________________

## 2019-03-13 NOTE — Progress Notes (Signed)
Latasha [name], [place] [id number]   CLINIC:  Medical Oncology/Hematology  PCP:  Patient, No Pcp Per No address on file None   REASON FOR VISIT:  Follow-up for ovarian [name]   BRIEF ONCOLOGIC HISTORY:  Oncology History  Ovarian [name] ([place])  04/25/2015 - 04/28/2015 Hospital Admission   Nausea/diarrhea.  Oncology consult completed on 04/27/2015.   04/25/2015 Tumor Marker   CA 125- 3902.      CEA WNL   04/25/2015 Imaging   CT Abd/pelvis- Significant ascites with multiple peritoneal based soft tissue masses within the pelvis likely representing ovarian [name] with peritoneal carcinomatosis.   04/27/2015 Procedure   US Paracentesis- A total of approximately 3700 mL of [name] colored fluid was removed. A fluid sample was sent for laboratory analysis.   04/27/2015 Imaging   CT Chest- Mild left supraclavicular lymphadenopathy and moderate mediastinal lymphadenopathy involving the prevascular, subcarinal and bilateral pericardiophrenic nodal chains, likely metastatic.   04/27/2015 Pathology Results   Diagnosis PERITONEAL/ASCITIC FLUID(SPECIMEN 1 OF 1 COLLECTED 04/27/15): MALIGNANT CELLS CONSISTENT WITH METASTATIC ADENOCARCINOMA.  The immunophenotype is most consistent with a gynecologic primary, most likely ovary.   05/08/2015 Miscellaneous   Seen by Dr. Denman [name]- recommending Carboplatin/Paclitaxel x 3 cycles and return visit to see her (within 1 week of administration of third cycle) to evaluate for optimal sequencing of treatment modalities.   05/13/2015 - 05/15/2015 Hospital Admission   Hospitalized for AKI   05/19/2015 - 07/01/2015 Chemotherapy   Carboplatin/Paclitaxel x 3 cycles   07/28/2015 Surgery   Exploratory laparotomy with total abdominal hysterectomy, bilateral salpingo-oophorectomy, omentectomy radical tumor debulking for ovarian [name]; by Dr. Everitt [name].   07/28/2015 Pathology Results   Uterus +/- tubes/ovaries, neoplastic, cervix - HIGH GRADE SEROUS  CARCINOMA INVOLVING LEFT OVARY, LEFT FALLOPIAN TUBE AND RIGHT FALLOPIAN TUBE. - CERVIX, ENDOMETRIUM AND MYOMETRIUM ARE FREE OF TUMOR. 2. Cul-de-sac biop...   08/26/2015 -  Chemotherapy   Carboplatin/Paclitaxel x 3 cycles   12/23/2015 Miscellaneous   Genetic counseling.  Genetic testing was normal, and did not reveal a deleterious mutation in these genes   03/07/2017 Imaging   CT C/A/P IMPRESSION: Chest Impression:  No evidence of metastatic disease in thorax.  Abdomen / Pelvis Impression:  1. Interval increase in size of solitary nodular peritoneal implant in the LEFT upper quadrant . 2. No additional evidence of peritoneal nodularity. 3. No ascites.   07/31/2017 Progression   CT C/A/P: IMPRESSION: 1. Left upper quadrant nodule continues to progress, now measuring 20 x 26 mm. 2. New nodule identified between the in adrenal gland, concerning for metastatic deposit. 3. Interval development of tiny nodules in the left lower quadrant mesenteric, concerning for metastases. 4. No ascites. 5. Status post total abdominal hysterectomy and omentectomy.      [name] STAGING: [name] Staging Ovarian [name] [facility]) Staging form: Ovary, AJCC 7th Edition - Clinical stage from 04/27/2015: FIGO Stage IVB, calculated as Stage IV (T3c, N1, M1) - Signed by Latasha Cancer, PA-C on 09/16/2015 - Pathologic stage from 05/08/2015: FIGO Stage IVB, calculated as Stage IV (TX, N1, M1) - Signed by Latasha Amber, MD on 05/08/2015 - Pathologic stage from 07/28/2015: FIGO Stage IV (T3c, NX, M1) - Signed by Latasha Cancer, PA-C on 09/16/2015    INTERVAL HISTORY:  [name] 78 y.o. female returns for follow-up of her ovarian [name].  She is tolerating neratinib very well.  She does not miss any doses.  Denies any  vomiting or diarrhea.  Has  mild fatigue which is stable.  Occasional nausea has been stable.  Intermittent dizziness is also stable.  Denies any bleeding per rectum or melena.  Denies any  tingling or numbness in the extremities.  Denies any ER visits or hospitalizations.  No fevers or chills was reported.  Appetite is 100% and energy levels are 75%.   REVIEW OF SYSTEMS:  Review of Systems  Constitutional: Positive for fatigue.  All other systems reviewed and are negative.    PAST MEDICAL/SURGICAL HISTORY:  Past Medical History:  Diagnosis Date  . Arthritis   . B12 deficiency   . Cataract   . History of blood transfusion   . History of chemotherapy   . Hypertension   . Iron deficiency anemia   . Mixed hyperlipidemia   . Ovarian [name] ([place])   . 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 Schizophrenia ([place])   . Ulcerative colitis ([place])   . Vitamin D deficiency    Past Surgical History:  Procedure Laterality Date  . ABDOMINAL HYSTERECTOMY N/A 07/28/2015   Procedure: TOTAL HYSTERECTOMY ABDOMINAL BILATERAL SALPINGO OOPHRORECTOMY;  Surgeon: Latasha Amber, MD;  Location: [facility];  Service: Gynecology;  Laterality: N/A;  . DEBULKING N/A 07/28/2015   Procedure: DEBULKING;  Surgeon: Latasha Amber, MD;  Location: [facility];  Service: Gynecology;  Laterality: N/A;  . LAPAROTOMY N/A 07/28/2015   Procedure: EXPLORATORY LAPAROTOMY;  Surgeon: Latasha Amber, MD;  Location: [facility];  Service: Gynecology;  Laterality: N/A;  . OMENTECTOMY N/A 07/28/2015   Procedure: OMENTECTOMY ;  Surgeon: Latasha Amber, MD;  Location: [facility];  Service: Gynecology;  Laterality: N/A;  . PARACENTESIS  04/27/15  . PORTACATH PLACEMENT Right 04/2015  . REPLACEMENT TOTAL KNEE     right knee in 2003     SOCIAL HISTORY:  Social History   Socioeconomic History  . Marital status: Single    Spouse name: Not on file  . Number of children: 1  . Years of education: Not on file  . Highest education level: Not on file  Occupational History  . Not on file  Social Needs  . Financial resource strain: Not on file  . Food insecurity    Worry: Not on file    Inability: Not on file  . Transportation needs    Medical: Not on file     Non-medical: Not on file  Tobacco Use  . Smoking status: Never Smoker  . Smokeless tobacco: Never Used  Substance and Sexual Activity  . Alcohol use: No  . Drug use: No  . Sexual activity: Not Currently  Lifestyle  . Physical activity    Days per week: Not on file    Minutes per session: Not on file  . Stress: Not on file  Relationships  . Social Herbalist on phone: Not on file    Gets together: Not on file    Attends religious service: Not on file    Active member of club or organization: Not on file    Attends meetings of clubs or organizations: Not on file    Relationship status: Not on file  . Intimate partner violence    Fear of current or ex partner: Not on file    Emotionally abused: Not on file    Physically abused: Not on file    Forced sexual activity: Not on file  Other Topics Concern  . Not on file  Social History Narrative  . Not on file    FAMILY HISTORY:  Family History  Problem Relation Age of Onset  . Prostate [name] Brother   . [name] Paternal Uncle        1 uncle with [name] NOS  . Lung [name] Maternal Grandmother   . Hypertension Maternal Grandfather   . Congestive Heart Failure Mother   . Seizures Sister     CURRENT MEDICATIONS:  Outpatient Encounter Medications as of 03/13/2019  Medication Sig Note  . acetaminophen (TYLENOL) 500 MG tablet Take 2 tablets (1,000 mg total) by mouth every 12 (twelve) hours. (Patient taking differently: Take 1,000 mg by mouth as needed. )   . amLODipine (NORVASC) 10 MG tablet Take 10 mg by mouth every evening.    Marland Kitchen aspirin EC 81 MG tablet Take 81 mg by mouth daily. 10/07/2015: taking  . Cholecalciferol (VITAMIN D-3) 1000 UNITS CAPS Take 1,000 Units by mouth daily.    Marland Kitchen docusate sodium (COLACE) 100 MG capsule Take 100 mg by mouth 2 (two) times daily.   . folic acid (FOLVITE) 1 MG tablet Take 1 mg by mouth daily.   Marland Kitchen lidocaine-prilocaine (EMLA) cream Apply to affected area once   . lisinopril  (PRINIVIL,ZESTRIL) 40 MG tablet Take 40 mg by mouth daily.    Marland Kitchen lovastatin (MEVACOR) 10 MG tablet Take 10 mg by mouth at bedtime.   . metoprolol tartrate (LOPRESSOR) 25 MG tablet Take 1 tablet (25 mg total) by mouth 2 (two) times daily.   . mirtazapine (REMERON) 15 MG tablet Take 7.5 mg by mouth at bedtime.   . Multiple Vitamin (MULTIVITAMIN WITH MINERALS) TABS tablet Take 1 tablet by mouth daily.   Alanda Slim Tosylate (ZEJULA) 100 MG CAPS Take 300 mg by mouth daily.   . ondansetron (ZOFRAN) 8 MG tablet Take 1 tablet (8 mg total) by mouth 2 (two) times daily as needed for refractory nausea / vomiting. Start on day 3 after chemo.   . prochlorperazine (COMPAZINE) 10 MG tablet Take 1 tablet (10 mg total) by mouth every 6 (six) hours as needed (Nausea or vomiting).   . QUEtiapine (SEROQUEL XR) 300 MG 24 hr tablet Take 300 mg by mouth at bedtime.   . sulfaSALAzine (AZULFIDINE) 500 MG tablet Take 500 mg by mouth 3 (three) times daily.   . traMADol (ULTRAM) 50 MG tablet TAKE (1) TABLET BY MOUTH EVERY SIX HOURS AS NEEDED.   Marland Kitchen vitamin B-12 (CYANOCOBALAMIN) 1000 MCG tablet Take 1,000 mcg by mouth daily.   . [DISCONTINUED] cholecalciferol (VITAMIN D) 25 MCG (1000 UT) tablet    . [DISCONTINUED] sulfaSALAzine (AZULFIDINE) 500 MG tablet     No facility-administered encounter medications on file as of 03/13/2019.     ALLERGIES:  No Known Allergies   PHYSICAL EXAM:  ECOG Performance status: 1  Vitals:   03/13/19 1049  BP: 139/70  Pulse: 76  Resp: 18  Temp: 98.1 F (36.7 C)  SpO2: 99%   Filed Weights   03/13/19 1049  Weight: 166 lb 3.2 oz (75.4 kg)    Physical Exam Vitals signs reviewed.  Constitutional:      Appearance: Normal appearance.  Cardiovascular:     Rate and Rhythm: Normal rate and regular rhythm.     Heart sounds: Normal heart sounds.  Pulmonary:     Effort: Pulmonary effort is normal.     Breath sounds: Normal breath sounds.  Abdominal:     General: There is no  distension.     Palpations: Abdomen is soft. There is no mass.  Musculoskeletal:  General: No swelling.  Skin:    General: Skin is warm.  Neurological:     General: No focal deficit present.     Mental Status: She is alert.  Psychiatric:        Mood and Affect: Mood normal.        Behavior: Behavior normal.      LABORATORY DATA:  I have reviewed the labs as listed.  CBC    Component Value Date/Time   WBC 5.2 03/11/2019 0817   RBC 2.95 (L) 03/11/2019 0817   HGB 10.7 (L) 03/11/2019 0817   HCT 32.9 (L) 03/11/2019 0817   PLT 275 03/11/2019 0817   MCV 111.5 (H) 03/11/2019 0817   MCH 36.3 (H) 03/11/2019 0817   MCHC 32.5 03/11/2019 0817   RDW 13.2 03/11/2019 0817   LYMPHSABS 1.3 03/11/2019 0817   MONOABS 0.8 03/11/2019 0817   EOSABS 0.3 03/11/2019 0817   BASOSABS 0.1 03/11/2019 0817   CMP Latest Ref Rng & Units 03/11/2019 01/22/2019 11/15/2018  Glucose 70 - 99 mg/dL 104(H) 96 109(H)  BUN 8 - 23 mg/dL 14 11 9   Creatinine 0.44 - 1.00 mg/dL 1.32(H) 1.10(H) 1.14(H)  Sodium 135 - 145 mmol/L 139 142 138  Potassium 3.5 - 5.1 mmol/L 4.4 4.5 3.9  Chloride 98 - 111 mmol/L 102 108 101  CO2 22 - 32 mmol/L 27 28 27   Calcium 8.9 - 10.3 mg/dL 9.7 9.6 9.8  Total Protein 6.5 - 8.1 g/dL 7.8 7.3 7.9  Total Bilirubin 0.3 - 1.2 mg/dL 0.6 0.4 0.5  Alkaline Phos 38 - 126 U/L 87 84 99  AST 15 - 41 U/L 33 31 32  ALT 0 - 44 U/L 29 28 28        DIAGNOSTIC IMAGING:  I have independently reviewed the scans and discussed with the patient.    ASSESSMENT & PLAN:   Ovarian [name] ([place]) 1.  Stage IVb ovarian [name]: - Had a history of mediastinal lymphadenopathy and malignant ascites. - She underwent 3 cycles of carboplatin and paclitaxel followed by debulking surgery by Dr. Denman [name] on 07/28/2015, followed by 3 more cycles finished on 10/07/2015. -Status post 6 cycles of carboplatin and paclitaxel from 08/17/2017 through 11/30/2017 in the second line setting. -Germline mutation testing for BRCA  was negative. -She was started on niraparib 300 mg daily maintenance since April 2019.  She was recommended to be on niraparib until March/April 2021 by Dr. Denman [name].  -CT CAP on 03/11/2019 did not show any progression.  -Ca1 25 results within normal limits.  She has mild elevated creatinine likely from neratinib. -She is continuing to tolerate it very well.  I plan to see her back in 2 months with repeat blood works and tumor marker.  2.  Back pain and abdominal pain: -Takes tramadol 50 mg as needed.  3.  Hypertension: -Blood pressure today is 139/70. -We will continue lisinopril 40 mg daily and metoprolol 25 mg twice daily.     Total time spent is 25 minutes with more than 50% of the time spent face-to-face discussing treatment plan and coordination of care.  Orders placed this encounter:  Orders Placed This Encounter  Procedures  . CBC with Differential  . Comprehensive metabolic panel  . CA [place], MD [place] [phone]

## 2019-03-14 ENCOUNTER — Ambulatory Visit (HOSPITAL_COMMUNITY): Payer: Medicare Other | Admitting: Hematology

## 2019-05-07 ENCOUNTER — Inpatient Hospital Stay (HOSPITAL_COMMUNITY): Payer: Medicare Other | Attending: Hematology

## 2019-05-07 ENCOUNTER — Other Ambulatory Visit: Payer: Self-pay

## 2019-05-07 DIAGNOSIS — R109 Unspecified abdominal pain: Secondary | ICD-10-CM | POA: Diagnosis not present

## 2019-05-07 DIAGNOSIS — Z9221 Personal history of antineoplastic chemotherapy: Secondary | ICD-10-CM | POA: Insufficient documentation

## 2019-05-07 DIAGNOSIS — C562 Malignant neoplasm of left ovary: Secondary | ICD-10-CM | POA: Insufficient documentation

## 2019-05-07 DIAGNOSIS — Z79899 Other long term (current) drug therapy: Secondary | ICD-10-CM | POA: Insufficient documentation

## 2019-05-07 DIAGNOSIS — I10 Essential (primary) hypertension: Secondary | ICD-10-CM | POA: Insufficient documentation

## 2019-05-07 DIAGNOSIS — M549 Dorsalgia, unspecified: Secondary | ICD-10-CM | POA: Insufficient documentation

## 2019-05-07 DIAGNOSIS — C569 Malignant neoplasm of unspecified ovary: Secondary | ICD-10-CM

## 2019-05-07 LAB — COMPREHENSIVE METABOLIC PANEL
ALT: 24 U/L (ref 0–44)
AST: 30 U/L (ref 15–41)
Albumin: 4.5 g/dL (ref 3.5–5.0)
Alkaline Phosphatase: 93 U/L (ref 38–126)
Anion gap: 9 (ref 5–15)
BUN: 13 mg/dL (ref 8–23)
CO2: 27 mmol/L (ref 22–32)
Calcium: 9.5 mg/dL (ref 8.9–10.3)
Chloride: 103 mmol/L (ref 98–111)
Creatinine, Ser: 1.24 mg/dL — ABNORMAL HIGH (ref 0.44–1.00)
GFR calc Af Amer: 49 mL/min — ABNORMAL LOW (ref >60)
GFR calc non Af Amer: 42 mL/min — ABNORMAL LOW (ref >60)
Glucose, Bld: 92 mg/dL (ref 70–99)
Potassium: 4.3 mmol/L (ref 3.5–5.1)
Sodium: 139 mmol/L (ref 135–145)
Total Bilirubin: 0.5 mg/dL (ref 0.3–1.2)
Total Protein: 7.8 g/dL (ref 6.5–8.1)

## 2019-05-07 LAB — CBC WITH DIFFERENTIAL/PLATELET
Abs Immature Granulocytes: 0.01 10*3/uL (ref 0.00–0.07)
Basophils Absolute: 0.1 10*3/uL (ref 0.0–0.1)
Basophils Relative: 1 %
Eosinophils Absolute: 0.3 10*3/uL (ref 0.0–0.5)
Eosinophils Relative: 5 %
HCT: 32.4 % — ABNORMAL LOW (ref 36.0–46.0)
Hemoglobin: 10.3 g/dL — ABNORMAL LOW (ref 12.0–15.0)
Immature Granulocytes: 0 %
Lymphocytes Relative: 24 %
Lymphs Abs: 1.3 10*3/uL (ref 0.7–4.0)
MCH: 35.6 pg — ABNORMAL HIGH (ref 26.0–34.0)
MCHC: 31.8 g/dL (ref 30.0–36.0)
MCV: 112.1 fL — ABNORMAL HIGH (ref 80.0–100.0)
Monocytes Absolute: 0.9 10*3/uL (ref 0.1–1.0)
Monocytes Relative: 16 %
Neutro Abs: 3 10*3/uL (ref 1.7–7.7)
Neutrophils Relative %: 54 %
Platelets: 303 10*3/uL (ref 150–400)
RBC: 2.89 MIL/uL — ABNORMAL LOW (ref 3.87–5.11)
RDW: 13.2 % (ref 11.5–15.5)
WBC: 5.5 10*3/uL (ref 4.0–10.5)
nRBC: 0.4 % — ABNORMAL HIGH (ref 0.0–0.2)

## 2019-05-08 LAB — CA 125: Cancer Antigen (CA) 125: 6.9 U/mL (ref 0.0–38.1)

## 2019-05-14 ENCOUNTER — Inpatient Hospital Stay (HOSPITAL_BASED_OUTPATIENT_CLINIC_OR_DEPARTMENT_OTHER): Payer: Medicare Other | Admitting: Hematology

## 2019-05-14 ENCOUNTER — Other Ambulatory Visit: Payer: Self-pay

## 2019-05-14 ENCOUNTER — Other Ambulatory Visit (HOSPITAL_COMMUNITY): Payer: Self-pay | Admitting: *Deleted

## 2019-05-14 ENCOUNTER — Encounter (HOSPITAL_COMMUNITY): Payer: Self-pay | Admitting: Hematology

## 2019-05-14 VITALS — BP 176/66 | HR 87 | Temp 97.5°F | Resp 16 | Wt 166.0 lb

## 2019-05-14 DIAGNOSIS — C562 Malignant neoplasm of left ovary: Secondary | ICD-10-CM | POA: Diagnosis not present

## 2019-05-14 DIAGNOSIS — C569 Malignant neoplasm of unspecified ovary: Secondary | ICD-10-CM | POA: Diagnosis not present

## 2019-05-14 MED ORDER — TRAMADOL HCL 50 MG PO TABS: ORAL_TABLET | ORAL | 0 refills | Status: DC

## 2019-05-14 NOTE — Patient Instructions (Addendum)
[place] at [facility] Discharge Instructions  You were seen today by Dr. Delton Coombes. He went over your recent lab results. He will see you back in 2 months for labs and follow up.   Thank you for choosing [place] at [facility] to provide your oncology and hematology care.  To afford each patient quality time with our provider, please arrive at least 15 minutes before your scheduled appointment time.   If you have a lab appointment with the [facility] please come in thru the  Main Entrance and check in at the main information desk  You need to re-schedule your appointment should you arrive 10 or more minutes late.  We strive to give you quality time with our providers, and arriving late affects you and other patients whose appointments are after yours.  Also, if you no show three or more times for appointments you may be dismissed from the clinic at the providers discretion.     Again, thank you for choosing [facility]  Our hope is that these requests will decrease the amount of time that you wait before being seen by our physicians.       _____________________________________________________________  Should you have questions after your visit to [facility], please contact our office at [phone] between the hours of 8:00 a.m. and 4:30 p.m.  Voicemails left after 4:00 p.m. will not be returned until the following business day.  For prescription refill requests, have your pharmacy contact our office and allow 72 hours.    Cancer Center Support Programs:   > Cancer Support Group  2nd Tuesday of the month 1pm-2pm, Journey Room

## 2019-05-14 NOTE — Assessment & Plan Note (Addendum)
1.  Stage IVb ovarian cancer: - History of mediastinal adenopathy and malignant ascites. -She underwent 3 cycles of carboplatin and paclitaxel followed by debulking surgery by Dr. Denman George on 07/28/2015 followed by 3 more cycles finished on 10/07/2015. - Status post 6 cycles of carboplatin and paclitaxel from 08/17/2017 through 11/30/2017 in the second line setting. -Germline mutation testing for BRCA was negative. -She was started on niraparib 300 mg daily maintenance since April 2019.  Latasha [name] was recommended until March/April 2021 by Dr. Denman George. - Last scans on 03/11/2019 did not show any progression. -She is tolerating niraparib very well.  Ca1 25 level was normal.  Creatinine is mildly elevated at 1.24 and stable, likely from niraparib. - Physical exam did not reveal any palpable masses.  I will recommend her to come back in 2 months with repeat scans and tumor marker.  2.  Back pain and abdominal pain: -She is taking tramadol 50 mg as needed.  I have given a refill.  3.  Hypertension: -Blood pressure today is 176/66.  Pulse rate is 87. -She will continue lisinopril 40 mg daily and metoprolol 25 mg twice daily.  She reports much better readings at home.

## 2019-05-14 NOTE — Progress Notes (Signed)
Latasha [name], [place] [address]   CLINIC:  Medical Oncology/Hematology  PCP:  Patient, No Pcp Per No address on file None   REASON FOR VISIT:  Follow-up for ovarian cancer   BRIEF ONCOLOGIC HISTORY:  Oncology History  Ovarian cancer ([place])  04/25/2015 - 04/28/2015 Hospital Admission   Nausea/diarrhea.  Oncology consult completed on 04/27/2015.   04/25/2015 Tumor Marker   CA 125- 3902.      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   04/27/2015 Procedure   US Paracentesis- A total of approximately 3700 mL of amber colored fluid was removed. A fluid sample was sent for laboratory analysis.   04/27/2015 Imaging   CT Chest- Mild left supraclavicular lymphadenopathy and moderate mediastinal lymphadenopathy involving the prevascular, subcarinal and bilateral pericardiophrenic nodal chains, likely metastatic.   04/27/2015 Pathology Results   Diagnosis PERITONEAL/ASCITIC FLUID(SPECIMEN 1 OF 1 COLLECTED 04/27/15): MALIGNANT CELLS CONSISTENT WITH METASTATIC ADENOCARCINOMA.  The immunophenotype is most consistent with a gynecologic primary, most likely ovary.   05/08/2015 Miscellaneous   Seen by Dr. Denman George- recommending Carboplatin/Paclitaxel x 3 cycles and return visit to see her (within 1 week of administration of third cycle) to evaluate for optimal sequencing of treatment modalities.   05/13/2015 - 05/15/2015 Hospital Admission   Hospitalized for AKI   05/19/2015 - 07/01/2015 Chemotherapy   Carboplatin/Paclitaxel x 3 cycles   07/28/2015 Surgery   Exploratory laparotomy with total abdominal hysterectomy, bilateral salpingo-oophorectomy, omentectomy radical tumor debulking for ovarian cancer; by Dr. Everitt Amber.   07/28/2015 Pathology Results   Uterus +/- tubes/ovaries, neoplastic, cervix - HIGH GRADE SEROUS  CARCINOMA INVOLVING LEFT OVARY, LEFT FALLOPIAN TUBE AND RIGHT FALLOPIAN TUBE. - CERVIX, ENDOMETRIUM AND MYOMETRIUM ARE FREE OF TUMOR. 2. Cul-de-sac biop...   08/26/2015 -  Chemotherapy   Carboplatin/Paclitaxel x 3 cycles   12/23/2015 Miscellaneous   Genetic counseling.  Genetic testing was normal, and did not reveal a deleterious mutation in these genes   03/07/2017 Imaging   CT C/A/P IMPRESSION: Chest Impression:  No evidence of metastatic disease in thorax.  Abdomen / Pelvis Impression:  1. Interval increase in size of solitary nodular peritoneal implant in the LEFT upper quadrant . 2. No additional evidence of peritoneal nodularity. 3. No ascites.   07/31/2017 Progression   CT C/A/P: IMPRESSION: 1. Left upper quadrant nodule continues to progress, now measuring 20 x 26 mm. 2. New nodule identified between the in adrenal gland, concerning for metastatic deposit. 3. Interval development of tiny nodules in the left lower quadrant mesenteric, concerning for metastases. 4. No ascites. 5. Status post total abdominal hysterectomy and omentectomy.      CANCER STAGING: Cancer Staging Ovarian cancer [facility]) Staging form: Ovary, AJCC 7th Edition - Clinical stage from 04/27/2015: FIGO Stage IVB, calculated as Stage IV (T3c, N1, M1) - Signed by Baird Cancer, PA-C on 09/16/2015 - Pathologic stage from 05/08/2015: FIGO Stage IVB, calculated as Stage IV (TX, N1, M1) - Signed by Everitt Amber, MD on 05/08/2015 - Pathologic stage from 07/28/2015: FIGO Stage IV (T3c, NX, M1) - Signed by Baird Cancer, PA-C on 09/16/2015    INTERVAL HISTORY:  Latasha [name] 78 y.o. female seen for follow-up of ovarian cancer.  She is tolerating niraparib very well.  Appetite is 75%.  Energy levels are 50%.  She is able to do her household  activities.  Denies any nausea, vomiting, diarrhea or constipation.  No abdominal pains or bloating reported.  Back pain is well controlled with tramadol.  She is  taking her blood pressure medications without fail.  No chest pains or lightheadedness reported.  No tingling or numbness in extremities.  No ER visits or hospitalizations.  Denies any bleeding per rectum or melena.   REVIEW OF SYSTEMS:  Review of Systems  All other systems reviewed and are negative.    PAST MEDICAL/SURGICAL HISTORY:  Past Medical History:  Diagnosis Date  . Arthritis   . B12 deficiency   . Cataract   . History of blood transfusion   . History of chemotherapy   . Hypertension   . Iron deficiency anemia   . Mixed hyperlipidemia   . Ovarian cancer ([place])   . 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 Schizophrenia ([place])   . Ulcerative colitis ([place])   . Vitamin D deficiency    Past Surgical History:  Procedure Laterality Date  . ABDOMINAL HYSTERECTOMY N/A 07/28/2015   Procedure: TOTAL HYSTERECTOMY ABDOMINAL BILATERAL SALPINGO OOPHRORECTOMY;  Surgeon: Everitt Amber, MD;  Location: [facility];  Service: Gynecology;  Laterality: N/A;  . DEBULKING N/A 07/28/2015   Procedure: DEBULKING;  Surgeon: Everitt Amber, MD;  Location: [facility];  Service: Gynecology;  Laterality: N/A;  . LAPAROTOMY N/A 07/28/2015   Procedure: EXPLORATORY LAPAROTOMY;  Surgeon: Everitt Amber, MD;  Location: [facility];  Service: Gynecology;  Laterality: N/A;  . OMENTECTOMY N/A 07/28/2015   Procedure: OMENTECTOMY ;  Surgeon: Everitt Amber, MD;  Location: [facility];  Service: Gynecology;  Laterality: N/A;  . PARACENTESIS  04/27/15  . PORTACATH PLACEMENT Right 04/2015  . REPLACEMENT TOTAL KNEE     right knee in 2003     SOCIAL HISTORY:  Social History   Socioeconomic History  . Marital status: Single    Spouse name: Not on file  . Number of children: 1  . Years of education: Not on file  . Highest education level: Not on file  Occupational History  . Not on file  Social Needs  . Financial resource strain: Not on file  . Food insecurity    Worry: Not on file    Inability: Not on file  . Transportation needs    Medical: Not on  file    Non-medical: Not on file  Tobacco Use  . Smoking status: Never Smoker  . Smokeless tobacco: Never Used  Substance and Sexual Activity  . Alcohol use: No  . Drug use: No  . Sexual activity: Not Currently  Lifestyle  . Physical activity    Days per week: Not on file    Minutes per session: Not on file  . Stress: Not on file  Relationships  . Social Herbalist on phone: Not on file    Gets together: Not on file    Attends religious service: Not on file    Active member of club or organization: Not on file    Attends meetings of clubs or organizations: Not on file    Relationship status: Not on file  . Intimate partner violence    Fear of current or ex partner: Not on file    Emotionally abused: Not on file    Physically abused: Not on file    Forced sexual activity: Not on file  Other Topics Concern  . Not on file  Social History Narrative  . Not on file    FAMILY HISTORY:  Family History  Problem Relation Age of Onset  . Prostate cancer Brother   . Cancer Paternal Uncle        1 uncle with cancer NOS  . Lung cancer Maternal Grandmother   . Hypertension Maternal Grandfather   . Congestive Heart Failure Mother   . Seizures Sister     CURRENT MEDICATIONS:  Outpatient Encounter Medications as of 05/14/2019  Medication Sig Note  . acetaminophen (TYLENOL) 500 MG tablet Take 2 tablets (1,000 mg total) by mouth every 12 (twelve) hours. (Patient taking differently: Take 1,000 mg by mouth as needed. )   . amLODipine (NORVASC) 10 MG tablet Take 10 mg by mouth every evening.    Marland Kitchen aspirin EC 81 MG tablet Take 81 mg by mouth daily. 10/07/2015: taking  . cholecalciferol (VITAMIN D) 25 MCG (1000 UT) tablet    . Cholecalciferol (VITAMIN D-3) 1000 UNITS CAPS Take 1,000 Units by mouth daily.    Marland Kitchen docusate sodium (COLACE) 100 MG capsule Take 100 mg by mouth 2 (two) times daily.   . folic acid (FOLVITE) 1 MG tablet Take 1 mg by mouth daily.   Marland Kitchen lidocaine-prilocaine  (EMLA) cream Apply to affected area once   . lisinopril (PRINIVIL,ZESTRIL) 40 MG tablet Take 40 mg by mouth daily.    Marland Kitchen lovastatin (MEVACOR) 10 MG tablet Take 10 mg by mouth at bedtime.   . metoprolol tartrate (LOPRESSOR) 25 MG tablet Take 1 tablet (25 mg total) by mouth 2 (two) times daily.   . mirtazapine (REMERON) 15 MG tablet Take 7.5 mg by mouth at bedtime.   . Multiple Vitamin (MULTIVITAMIN WITH MINERALS) TABS tablet Take 1 tablet by mouth daily.   Alanda Slim Tosylate (ZEJULA) 100 MG CAPS Take 300 mg by mouth daily.   . ondansetron (ZOFRAN) 8 MG tablet Take 1 tablet (8 mg total) by mouth 2 (two) times daily as needed for refractory nausea / vomiting. Start on day 3 after chemo.   . prochlorperazine (COMPAZINE) 10 MG tablet Take 1 tablet (10 mg total) by mouth every 6 (six) hours as needed (Nausea or vomiting).   . QUEtiapine (SEROQUEL XR) 300 MG 24 hr tablet Take 300 mg by mouth at bedtime.   . sulfaSALAzine (AZULFIDINE) 500 MG tablet Take 500 mg by mouth 3 (three) times daily.   . vitamin B-12 (CYANOCOBALAMIN) 1000 MCG tablet Take 1,000 mcg by mouth daily.   . [DISCONTINUED] traMADol (ULTRAM) 50 MG tablet TAKE (1) TABLET BY MOUTH EVERY SIX HOURS AS NEEDED.    No facility-administered encounter medications on file as of 05/14/2019.     ALLERGIES:  No Known Allergies   PHYSICAL EXAM:  ECOG Performance status: 1  Vitals:   05/14/19 1119  BP: (!) 176/66  Pulse: 87  Resp: 16  Temp: (!) 97.5 F (36.4 C)  SpO2: 98%   Filed Weights   05/14/19 1119  Weight: 166 lb (75.3 kg)    Physical Exam Vitals signs reviewed.  Constitutional:      Appearance: Normal appearance.  Cardiovascular:     Rate and Rhythm: Normal rate and regular rhythm.     Heart sounds: Normal heart sounds.  Pulmonary:     Effort: Pulmonary effort is normal.     Breath sounds: Normal breath sounds.  Abdominal:     General: There is no distension.     Palpations: Abdomen is soft. There is no mass.   Musculoskeletal:        General: No swelling.  Skin:  General: Skin is warm.  Neurological:     General: No focal deficit present.     Mental Status: She is alert.  Psychiatric:        Mood and Affect: Mood normal.        Behavior: Behavior normal.      LABORATORY DATA:  I have reviewed the labs as listed.  CBC    Component Value Date/Time   WBC 5.5 05/07/2019 1033   RBC 2.89 (L) 05/07/2019 1033   HGB 10.3 (L) 05/07/2019 1033   HCT 32.4 (L) 05/07/2019 1033   PLT 303 05/07/2019 1033   MCV 112.1 (H) 05/07/2019 1033   MCH 35.6 (H) 05/07/2019 1033   MCHC 31.8 05/07/2019 1033   RDW 13.2 05/07/2019 1033   LYMPHSABS 1.3 05/07/2019 1033   MONOABS 0.9 05/07/2019 1033   EOSABS 0.3 05/07/2019 1033   BASOSABS 0.1 05/07/2019 1033   CMP Latest Ref Rng & Units 05/07/2019 03/11/2019 01/22/2019  Glucose 70 - 99 mg/dL 92 104(H) 96  BUN 8 - 23 mg/dL 13 14 11   Creatinine 0.44 - 1.00 mg/dL 1.24(H) 1.32(H) 1.10(H)  Sodium 135 - 145 mmol/L 139 139 142  Potassium 3.5 - 5.1 mmol/L 4.3 4.4 4.5  Chloride 98 - 111 mmol/L 103 102 108  CO2 22 - 32 mmol/L 27 27 28   Calcium 8.9 - 10.3 mg/dL 9.5 9.7 9.6  Total Protein 6.5 - 8.1 g/dL 7.8 7.8 7.3  Total Bilirubin 0.3 - 1.2 mg/dL 0.5 0.6 0.4  Alkaline Phos 38 - 126 U/L 93 87 84  AST 15 - 41 U/L 30 33 31  ALT 0 - 44 U/L 24 29 28        DIAGNOSTIC IMAGING:  I have independently reviewed the scans and discussed with the patient.    ASSESSMENT & PLAN:   Ovarian cancer ([place]) 1.  Stage IVb ovarian cancer: - History of mediastinal adenopathy and malignant ascites. -She underwent 3 cycles of carboplatin and paclitaxel followed by debulking surgery by Dr. Denman George on 07/28/2015 followed by 3 more cycles finished on 10/07/2015. - Status post 6 cycles of carboplatin and paclitaxel from 08/17/2017 through 11/30/2017 in the second line setting. -Germline mutation testing for BRCA was negative. -She was started on niraparib 300 mg daily maintenance since  April 2019.  Alanda Slim was recommended until March/April 2021 by Dr. Denman George. - Last scans on 03/11/2019 did not show any progression. -She is tolerating niraparib very well.  Ca1 25 level was normal.  Creatinine is mildly elevated at 1.24 and stable, likely from niraparib. - Physical exam did not reveal any palpable masses.  I will recommend her to come back in 2 months with repeat scans and tumor marker.  2.  Back pain and abdominal pain: -She is taking tramadol 50 mg as needed.  I have given a refill.  3.  Hypertension: -Blood pressure today is 176/66.  Pulse rate is 87. -She will continue lisinopril 40 mg daily and metoprolol 25 mg twice daily.  She reports much better readings at home.   Total time spent is 25 minutes with more than 50% of the time spent face-to-face discussing treatment plan and coordination of care.  Orders placed this encounter:  Orders Placed This Encounter  Procedures  . CT Abdomen Pelvis W Contrast  . CBC with Differential/Platelet  . Comprehensive metabolic panel  . [place], [place] [phone]

## 2019-06-11 ENCOUNTER — Other Ambulatory Visit (HOSPITAL_COMMUNITY): Payer: Self-pay | Admitting: Hematology

## 2019-06-11 ENCOUNTER — Other Ambulatory Visit (HOSPITAL_COMMUNITY): Payer: Self-pay | Admitting: *Deleted

## 2019-06-11 DIAGNOSIS — C562 Malignant neoplasm of left ovary: Secondary | ICD-10-CM

## 2019-06-11 MED ORDER — ZEJULA 100 MG PO CAPS: 300.0000 mg | ORAL_CAPSULE | Freq: Every day | ORAL | 3 refills | Status: DC

## 2019-06-11 NOTE — Telephone Encounter (Signed)
[place] refilled, chart reviewed, okay per Dr. Tomie China last office note.

## 2019-06-14 ENCOUNTER — Inpatient Hospital Stay: Payer: Medicare Other | Attending: Gynecologic Oncology | Admitting: Gynecologic Oncology

## 2019-06-14 ENCOUNTER — Encounter: Payer: Self-pay | Admitting: Gynecologic Oncology

## 2019-06-14 ENCOUNTER — Other Ambulatory Visit: Payer: Self-pay

## 2019-06-14 VITALS — BP 136/80 | HR 86 | Temp 98.3°F | Resp 16 | Ht 66.0 in | Wt 170.4 lb

## 2019-06-14 DIAGNOSIS — Z7982 Long term (current) use of aspirin: Secondary | ICD-10-CM | POA: Insufficient documentation

## 2019-06-14 DIAGNOSIS — C569 Malignant neoplasm of unspecified ovary: Secondary | ICD-10-CM | POA: Diagnosis present

## 2019-06-14 DIAGNOSIS — E559 Vitamin D deficiency, unspecified: Secondary | ICD-10-CM | POA: Diagnosis not present

## 2019-06-14 DIAGNOSIS — Z9221 Personal history of antineoplastic chemotherapy: Secondary | ICD-10-CM | POA: Diagnosis not present

## 2019-06-14 DIAGNOSIS — M1990 Unspecified osteoarthritis, unspecified site: Secondary | ICD-10-CM | POA: Insufficient documentation

## 2019-06-14 DIAGNOSIS — E538 Deficiency of other specified B group vitamins: Secondary | ICD-10-CM | POA: Diagnosis not present

## 2019-06-14 DIAGNOSIS — Z79899 Other long term (current) drug therapy: Secondary | ICD-10-CM | POA: Insufficient documentation

## 2019-06-14 DIAGNOSIS — I10 Essential (primary) hypertension: Secondary | ICD-10-CM | POA: Diagnosis not present

## 2019-06-14 DIAGNOSIS — E782 Mixed hyperlipidemia: Secondary | ICD-10-CM | POA: Insufficient documentation

## 2019-06-14 DIAGNOSIS — D509 Iron deficiency anemia, unspecified: Secondary | ICD-10-CM | POA: Diagnosis not present

## 2019-06-14 DIAGNOSIS — Z90722 Acquired absence of ovaries, bilateral: Secondary | ICD-10-CM | POA: Insufficient documentation

## 2019-06-14 DIAGNOSIS — F209 Schizophrenia, unspecified: Secondary | ICD-10-CM | POA: Insufficient documentation

## 2019-06-14 DIAGNOSIS — Z90710 Acquired absence of both cervix and uterus: Secondary | ICD-10-CM | POA: Insufficient documentation

## 2019-06-14 NOTE — Progress Notes (Signed)
Followup Note: Gyn-Onc  Latasha [name] 78 y.o. 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 Ovarian Cancer    follow-up    Assessment/Plan:  Latasha [name]  is a 78 y.o.  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 BRCA negative on testing.  S/p salvage 2nd line chemotherapy with carb/taxol completed 11/30/17. Now on Niraparib since March, 2019.  No measurable disease.  Tolerating maintenance PARPi. Recommend continuing this until 24 months (March, 2021) disease free at which time it can be suspended versus continued (if she is tolerating treatment well and disease free).  I will see Latasha [name] back in March, 2021  for a pelvic exam as part of surveillance.   HPI: Latasha [name] is a very pleasant 78 year old G1 who is seen in consultation at the request of Dr Whitney Muse for (clinical) stage IVB ovarian cancer. The patient developed symptoms of progressive abdominal fullness and discomfort over the course of several months. In August, 2016 she presented to the [facility] where imaging of the abdomen and pelvis was obtained for diarrhea, nausea and emesis. Imaging on 04/25/15 revealed large volume ascites, carcinomatosis and peritoneal masses measuring up to 5.1cm. The patient underwent paracentesis with cytology on 04/27/15 which revealed metastatic adenocarcinoma consistent on immunostains with gyn primary.  CT of the chest on 04/27/15 revealed mild left supraclavicular lymphadenopathy, and mediastinal lymphadenopathy consistent with metastatic disease. There were <32m pulmonary nodules also seen which were indeterminant. There were trace pleural effusions seen.   CA 125 on 04/25/15: 3902.  She then went on to receive 3 cycles of paclitaxel and carboplatin chemotherapy with Dr PWhitney Muse Cycle 3 was on 07/01/15.  CA 125 on 07/01/15 was 249.  CT scan on 07/20/15 showed: Previously noted  ascites has resolved. No pneumoperitoneum. Many of the previously noted peritoneal implants are no longer confidently identified on today's examination. There are few smaller implants noted, the largest of which is in th  left side of the abdomen anteriorly measuring 9 x 11 mm. There is also a 10 x 7 mm lesion superficial to the splenic flexure of the colon which is substantially smaller than the prior study (previously 2.8 x 1.7 cm).  There was resolution of the chest adenopathy.  She has tolerated chemotherapy well with a good appetites and good general function.  On 07/31/15 she was taken to the OR for an ex lap, TAH, BSO, omentectomy, radical tumor debulking. Intraoperative findings included: Excellent response to neoadjuvant chemotherapy. Small volume plaques/nodules in left and right posterior cul de sac behind uterus, tumor on left ovary (3cm), multiple 2cm nodules in omentum. No other peritoneal disease. Diaphragm normal.  She had an optimal cytoreduction (R0) with no gross visible disease remaining.  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  Postoperatively she did very well with no postoperative complications.  She went on to receive 3 additional cycles of carboplatin and paclitaxel completed April, 2017. She tolerated therapy very well.  Post treatment scans were unremarkable and showed no residual disease.  Her CA 125 was monitored at 3 monthly intervals. In January, 2018 it was normal at 17. On April, 20th ,2018 it had increased to 32. On June 15th, 2018 it had increased again to 47. CT chest/abdo/pelvis on 03/07/17 showed solitary pertoneal nodule in the gastrosplenic ligament measuring 157mx 1634mincreased from a prior scan where it  was 1cm). No additional nodularity is noted nor ascites.  She was offered secondary cytoreduction surgery followed by chemotherapy, however she declined this and opted for expectant management because she felt  good.  On 07/31/17 repeat CT abdo/pelvis was performed. It showed progression of peritoneal disease with an enlargement of a peri-splenic lesion, a new nodule adjacent to the adrenal gland and a new left lower quadrant peritoneal metastases.  She agreed to commend salvage 2nd line chemotherapy with carboplatin and paclitaxel in December, 2018 due to the onset of symptomatic recurrence. She completed 6 cycles of this on 11/30/17 and was then changed to Niraparib maintenance 356m daily starting in March, 2019.  CA 125 was normal at 9 on 03/26/18.  CT scan abd/pelvis in July, 2019 was normal with no evidence of recurrent/progressive disease.  CT scan in December, 2019 was stable with no progression.  CA 125 on 11/15/18 was normal and stable at 6.7. CT scan abd/pelvis on 11/15/18 showed no new progression or recurrence.   Interval Hx:  CA 125 on 01/22/19 was normal at 6. CA 125 on 03/11/19 was normal at 6.7. CA 125 on 05/07/19 was normal at 6.9.  She continues to feel well with no abdominal symptoms and an excellent performance status (ECOG 0).  She denies thrombocytopenia. She has mild occasional nausea.   Current Meds:  Outpatient Encounter Medications as of 06/14/2019  Medication Sig  . acetaminophen (TYLENOL) 500 MG tablet Take 2 tablets (1,000 mg total) by mouth every 12 (twelve) hours. (Patient taking differently: Take 1,000 mg by mouth as needed. )  . amLODipine (NORVASC) 10 MG tablet Take 10 mg by mouth every evening.   .Marland Kitchenaspirin EC 81 MG tablet Take 81 mg by mouth daily.  . cholecalciferol (VITAMIN D) 25 MCG (1000 UT) tablet   . Cholecalciferol (VITAMIN D-3) 1000 UNITS CAPS Take 1,000 Units by mouth daily.   .Marland Kitchendocusate sodium (COLACE) 100 MG capsule Take 100 mg by mouth 2 (two) times daily.  . folic acid (FOLVITE) 1 MG tablet Take 1 mg by mouth daily.  .Marland Kitchenlidocaine-prilocaine (EMLA) cream Apply to affected area once  . lisinopril (PRINIVIL,ZESTRIL) 40 MG tablet Take 40 mg by mouth daily.    .Marland Kitchenlovastatin (MEVACOR) 10 MG tablet Take 10 mg by mouth at bedtime.  . metoprolol tartrate (LOPRESSOR) 25 MG tablet Take 1 tablet (25 mg total) by mouth 2 (two) times daily.  . mirtazapine (REMERON) 15 MG tablet Take 7.5 mg by mouth at bedtime.  . Multiple Vitamin (MULTIVITAMIN WITH MINERALS) TABS tablet Take 1 tablet by mouth daily.  . ondansetron (ZOFRAN) 8 MG tablet Take 1 tablet (8 mg total) by mouth 2 (two) times daily as needed for refractory nausea / vomiting. Start on day 3 after chemo.  . prochlorperazine (COMPAZINE) 10 MG tablet Take 1 tablet (10 mg total) by mouth every 6 (six) hours as needed (Nausea or vomiting).  . QUEtiapine (SEROQUEL XR) 300 MG 24 hr tablet Take 300 mg by mouth at bedtime.  . sulfaSALAzine (AZULFIDINE) 500 MG tablet Take 500 mg by mouth 3 (three) times daily.  . traMADol (ULTRAM) 50 MG tablet TAKE (1) TABLET BY MOUTH EVERY SIX HOURS AS NEEDED.  .Marland Kitchenvitamin B-12 (CYANOCOBALAMIN) 1000 MCG tablet Take 1,000 mcg by mouth daily.  .Marland KitchenZEJULA 100 MG CAPS TAKE 3 CAPSULES (300MG) BY MOUTH DAILY.   No facility-administered encounter medications on file as of 06/14/2019.     Allergy: No Known Allergies  Social Hx:  Social History   Socioeconomic History  . Marital status: Single    Spouse name: Not on file  . Number of children: 1  . Years of education: Not on file  . Highest education level: Not on file  Occupational History  . Not on file  Social Needs  . Financial resource strain: Not on file  . Food insecurity    Worry: Not on file    Inability: Not on file  . Transportation needs    Medical: Not on file    Non-medical: Not on file  Tobacco Use  . Smoking status: Never Smoker  . Smokeless tobacco: Never Used  Substance and Sexual Activity  . Alcohol use: No  . Drug use: No  . Sexual activity: Not Currently  Lifestyle  . Physical activity    Days per week: Not on file    Minutes per session: Not on file  . Stress: Not on file  Relationships  .  Social Herbalist on phone: Not on file    Gets together: Not on file    Attends religious service: Not on file    Active member of club or organization: Not on file    Attends meetings of clubs or organizations: Not on file    Relationship status: Not on file  . Intimate partner violence    Fear of current or ex partner: Not on file    Emotionally abused: Not on file    Physically abused: Not on file    Forced sexual activity: Not on file  Other Topics Concern  . Not on file  Social History Narrative  . Not on file    Past Surgical Hx:  Past Surgical History:  Procedure Laterality Date  . ABDOMINAL HYSTERECTOMY N/A 07/28/2015   Procedure: TOTAL HYSTERECTOMY ABDOMINAL BILATERAL SALPINGO OOPHRORECTOMY;  Surgeon: Everitt Amber, MD;  Location: [facility];  Service: Gynecology;  Laterality: N/A;  . DEBULKING N/A 07/28/2015   Procedure: DEBULKING;  Surgeon: Everitt Amber, MD;  Location: [facility];  Service: Gynecology;  Laterality: N/A;  . LAPAROTOMY N/A 07/28/2015   Procedure: EXPLORATORY LAPAROTOMY;  Surgeon: Everitt Amber, MD;  Location: [facility];  Service: Gynecology;  Laterality: N/A;  . OMENTECTOMY N/A 07/28/2015   Procedure: OMENTECTOMY ;  Surgeon: Everitt Amber, MD;  Location: [facility];  Service: Gynecology;  Laterality: N/A;  . PARACENTESIS  04/27/15  . PORTACATH PLACEMENT Right 04/2015  . REPLACEMENT TOTAL KNEE     right knee in 2003    Past Medical Hx:  Past Medical History:  Diagnosis Date  . Arthritis   . B12 deficiency   . Cataract   . History of blood transfusion   . History of chemotherapy   . Hypertension   . Iron deficiency anemia   . Mixed hyperlipidemia   . Ovarian cancer ([place])   . 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 Schizophrenia ([place])   . Ulcerative colitis ([place])   . Vitamin D deficiency     Past Gynecological History:  SVD x 1  No LMP recorded. Patient has had a hysterectomy.  Family Hx:  Family History  Problem Relation Age of Onset  . Prostate cancer Brother   .  Cancer Paternal Uncle        1 uncle with cancer NOS  . Lung cancer Maternal Grandmother   . Hypertension Maternal Grandfather   . Congestive Heart Failure Mother   . Seizures Sister     Review of Systems:  Constitutional  Feels well,    ENT Normal appearing ears and nares bilaterally Skin/Breast  No rash, sores, jaundice, itching, dryness Cardiovascular  No chest pain, shortness of breath, or edema  Pulmonary  No cough or wheeze.  Gastro Intestinal  No nausea, vomitting, or diarrhoea. No bright red blood per rectum, no abdominal pain, change in bowel movement, or constipation.  Genito Urinary  No frequency, urgency, dysuria, no postmenopausal bleeding Musculo Skeletal  No myalgia, arthralgia, joint swelling or pain  Neurologic  No weakness, numbness, change in gait,  Psychology  No depression, anxiety, insomnia.   Vitals:  Blood pressure 136/80, pulse 86, temperature 98.3 F (36.8 C), temperature source Temporal, resp. rate 16, height 5' 6"  (1.676 m), weight 170 lb 6 oz (77.3 kg), SpO2 99 %.  Physical Exam: WD in NAD Neck  Supple NROM, without any enlargements.  Lymph Node Survey No cervical supraclavicular or inguinal adenopathy Cardiovascular  Pulse normal rate, regularity and rhythm. S1 and S2 normal.  Lungs  Clear to auscultation bilateraly, without wheezes/crackles/rhonchi. Good air movement.  Skin  No rash/lesions/breakdown  Psychiatry  Alert and oriented to person, place, and time  Abdomen  Normoactive bowel sounds, abdomen soft, non distended, non-tender and thin without evidence of hernia.  Incision well healed. No palpable masses. Back No CVA tenderness Genito Urinary  Vaginal cuff normal with no lesions. In tact vagina.No bleeding. Surgically absent uterus and cervix. Extremities  No bilateral cyanosis, clubbing or edema.   Thereasa Solo, MD   06/14/2019, 2:07 PM

## 2019-06-14 NOTE — Patient Instructions (Signed)
Please notify Dr Denman George at phone number [phone] if you notice vaginal bleeding, new pelvic or abdominal pains, bloating, feeling full easy, or a change in bladder or bowel function.   Dr Denman George is recommending continuing the Niraparib until at least March, 2021. If you are feeling well on this medication it is reasonable to continue until there is a sign of the cancer coming back. If you are feeling sick or tired on this medicine it is reasonable to stop it.  Please contact Dr Serita Grit office (at [phone]) in January, 2021 to request an appointment with her for March, 2021.

## 2019-07-04 IMAGING — CT CT ABDOMEN AND PELVIS WITH CONTRAST
2 of 5 series · 14 of 46 positions shown, 16 images · IV contrast (Isovue)
Comparison: 11/15/2018, 04/12/2018, 08/20/2018

CLINICAL DATA: Stage IVB LEFT ovarian carcinoma diagnosed 2318.
Status post hysterectomy. Oral chemotherapy maintenance. History of
Crohn's disease. Renal insufficiency. Reduced contrast

EXAM:
CT CHEST, ABDOMEN, AND PELVIS WITH CONTRAST
TECHNIQUE: Multidetector CT imaging of the chest, abdomen and pelvis was
performed following the standard protocol during bolus
administration of intravenous contrast.
CONTRAST:  80mL OMNIPAQUE IOHEXOL 300 MG/ML  SOLN

[Series 3: cap with · axial · 0.65mm/px · z∈[+1161,+1661]mm · 11 of 122 slices shown, 13 images]
[im 11/122  soft-tissue]
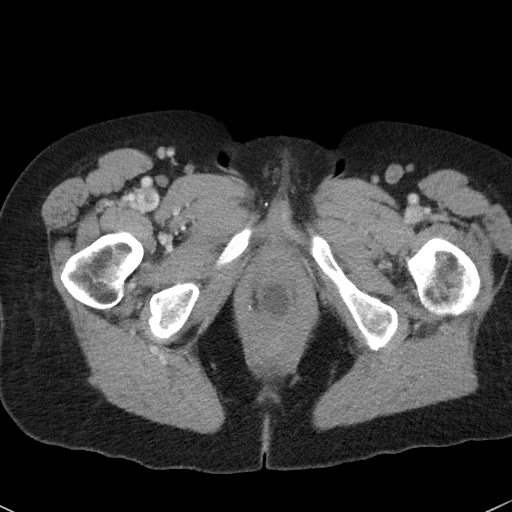
[im 11/122  bone]
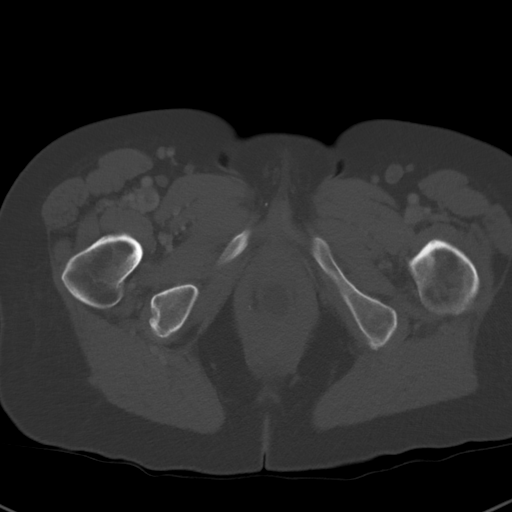
[im 21/122  soft-tissue]
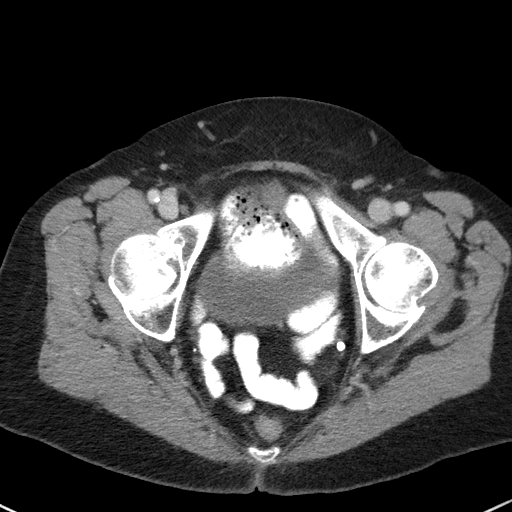
[im 31/122  soft-tissue]
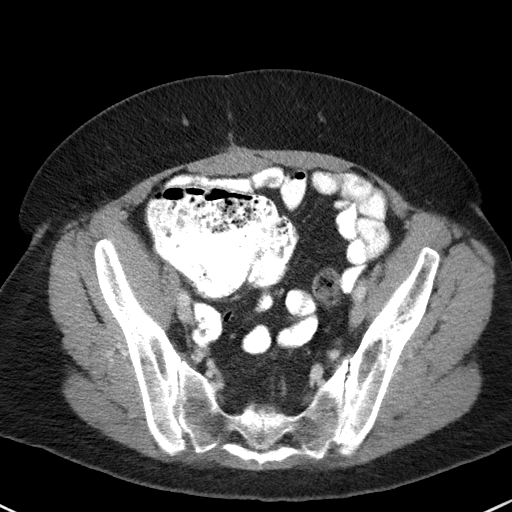
[im 41/122  soft-tissue]
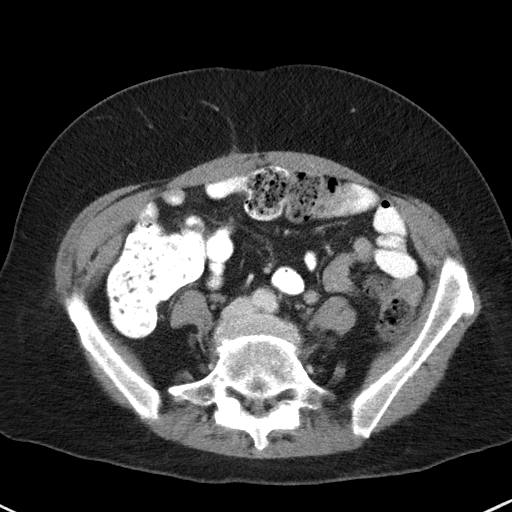
[im 51/122  soft-tissue]
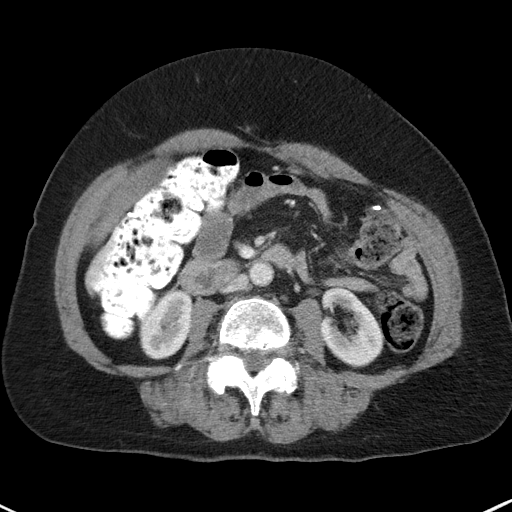
[im 61/122  soft-tissue]
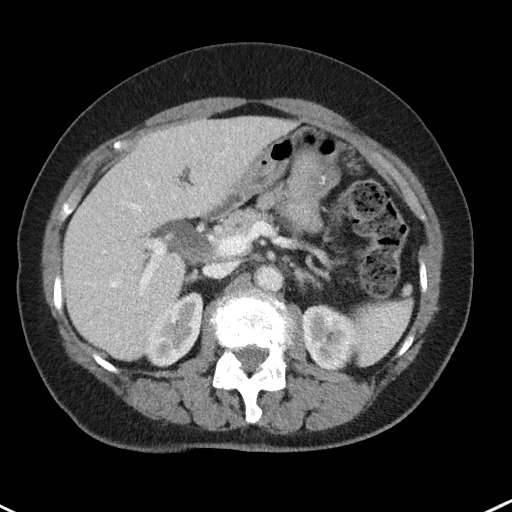
[im 71/122  soft-tissue]
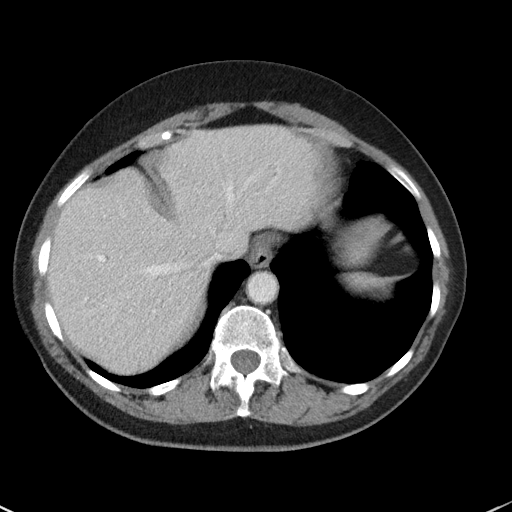
[im 81/122  soft-tissue]
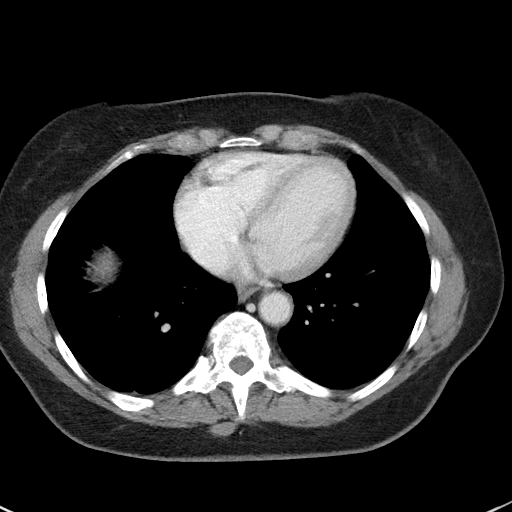
[im 91/122  soft-tissue]
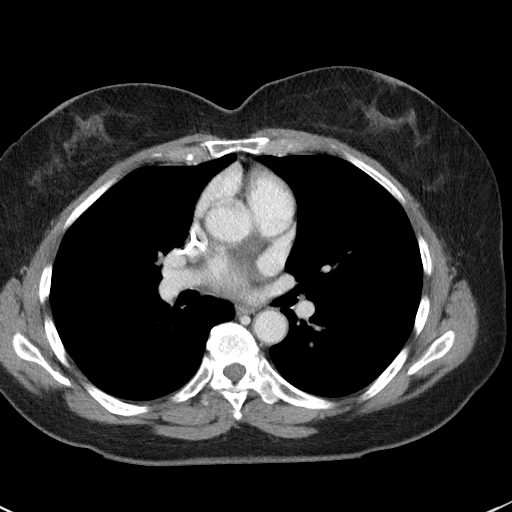
[im 91/122  bone]
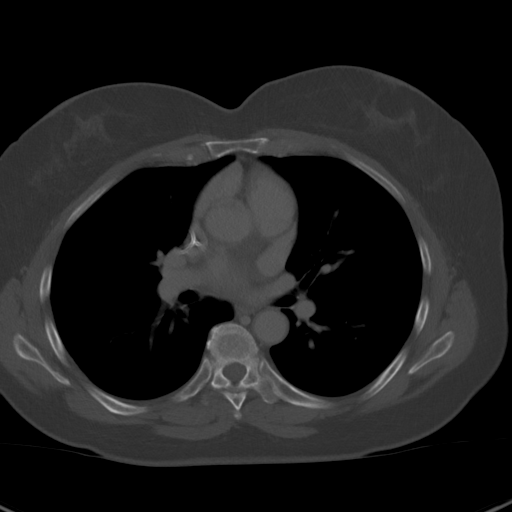
[im 101/122  soft-tissue]
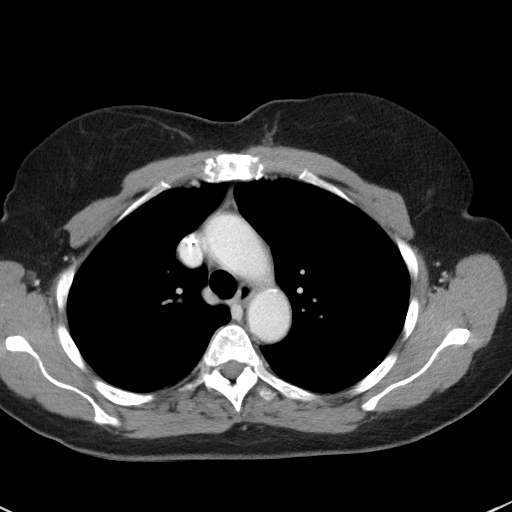
[im 111/122  soft-tissue]
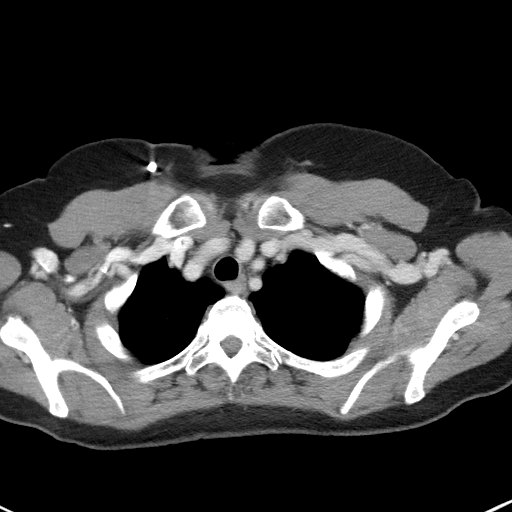

[Series 6: coronals · coronal · 0.69mm/px · 3 of 125 slices shown]
[im 42/125  soft-tissue]
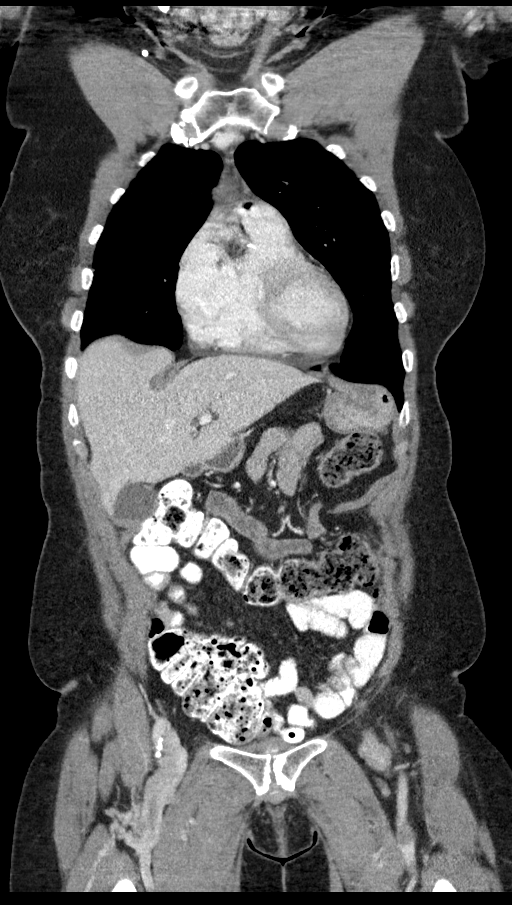
[im 56/125  soft-tissue]
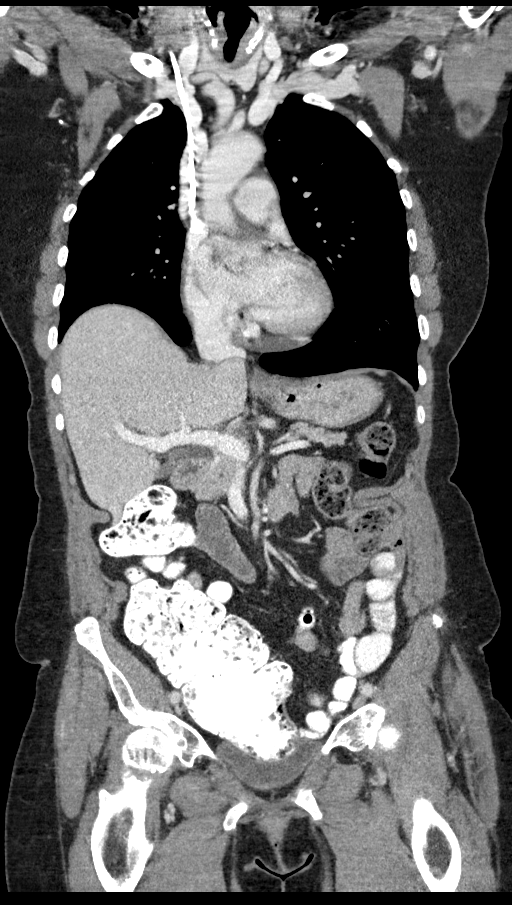
[im 69/125  soft-tissue]
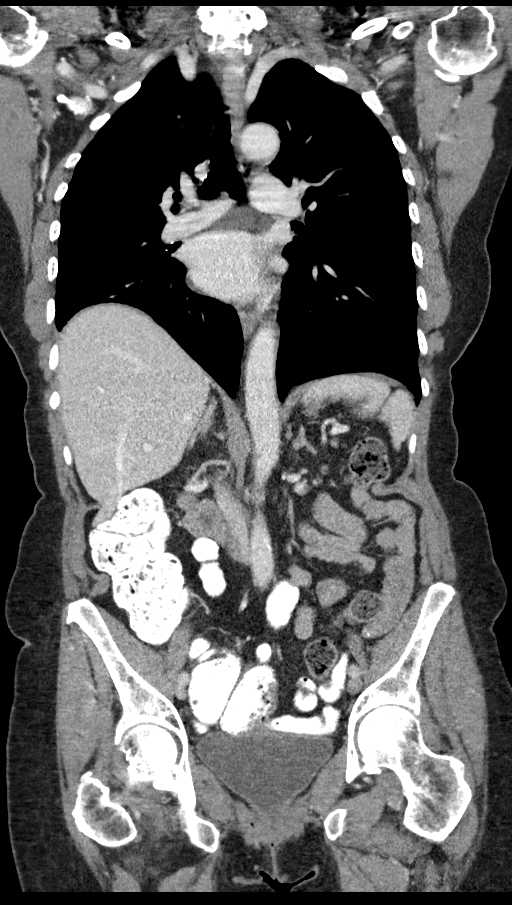

[14 of 46 positions shown; findings below may reference images not displayed]

FINDINGS: CT CHEST FINDINGS

Cardiovascular: Port in the anterior chest wall with tip in distal
SVC. No significant vascular findings. Normal heart size. No
pericardial effusion.

Mediastinum/Nodes: No axillary or supraclavicular adenopathy. No
mediastinal adenopathy. No pericardial fluid.

Lungs/Pleura: No suspicious pulmonary nodules.  Airways normal.

Musculoskeletal: No aggressive osseous lesion.

CT ABDOMEN AND PELVIS FINDINGS

Hepatobiliary: No focal hepatic lesion. Gallbladder normal.
Prominence of the common bile duct measuring up to 5 mm similar to
comparison exams.

Pancreas: Pancreas is normal. No ductal dilatation. No pancreatic
inflammation.

Spleen: Normal spleen

Adrenals/urinary tract: Adrenal glands and kidneys are normal. The
ureters and bladder normal.

Stomach/Bowel: Stomach, small bowel, appendix, and cecum are normal.
The colon and rectosigmoid colon are normal.

Vascular/Lymphatic: Abdominal aorta is normal caliber. There is no
retroperitoneal or periportal lymphadenopathy. No pelvic
lymphadenopathy.

Reproductive: Post hysterectomy and oophorectomy. No pelvic
nodularity

Other: Interval resolution of the peritoneal nodularity. No
peritoneal or omental nodularity identified. No free-fluid

Musculoskeletal: Degenerative changes of the spine no aggressive
osseous lesion.
IMPRESSION: Chest Impression:

No evidence of thoracic metastasis

Abdomen / Pelvis Impression:

1. Resolution of peritoneal nodularity.
2. No evidence of new or progressive carcinoma.
3. No free fluid.
4. Post hysterectomy and oophorectomy without evidence local
recurrence

## 2019-07-12 ENCOUNTER — Inpatient Hospital Stay (HOSPITAL_COMMUNITY): Payer: Medicare Other | Attending: Hematology

## 2019-07-12 ENCOUNTER — Ambulatory Visit (HOSPITAL_COMMUNITY)
Admission: RE | Admit: 2019-07-12 | Discharge: 2019-07-12 | Disposition: A | Discharge status: Home / Self Care | Payer: Medicare Other | Source: Ambulatory Visit | Attending: Hematology | Admitting: Hematology

## 2019-07-12 ENCOUNTER — Other Ambulatory Visit: Payer: Self-pay

## 2019-07-12 DIAGNOSIS — C562 Malignant neoplasm of left ovary: Secondary | ICD-10-CM | POA: Diagnosis present

## 2019-07-12 DIAGNOSIS — C569 Malignant neoplasm of unspecified ovary: Secondary | ICD-10-CM | POA: Insufficient documentation

## 2019-07-12 DIAGNOSIS — E559 Vitamin D deficiency, unspecified: Secondary | ICD-10-CM | POA: Diagnosis not present

## 2019-07-12 DIAGNOSIS — E782 Mixed hyperlipidemia: Secondary | ICD-10-CM | POA: Insufficient documentation

## 2019-07-12 DIAGNOSIS — M1990 Unspecified osteoarthritis, unspecified site: Secondary | ICD-10-CM | POA: Insufficient documentation

## 2019-07-12 DIAGNOSIS — I10 Essential (primary) hypertension: Secondary | ICD-10-CM | POA: Insufficient documentation

## 2019-07-12 DIAGNOSIS — Z90710 Acquired absence of both cervix and uterus: Secondary | ICD-10-CM | POA: Insufficient documentation

## 2019-07-12 DIAGNOSIS — E538 Deficiency of other specified B group vitamins: Secondary | ICD-10-CM | POA: Diagnosis not present

## 2019-07-12 DIAGNOSIS — Z9221 Personal history of antineoplastic chemotherapy: Secondary | ICD-10-CM | POA: Diagnosis not present

## 2019-07-12 DIAGNOSIS — Z7982 Long term (current) use of aspirin: Secondary | ICD-10-CM | POA: Insufficient documentation

## 2019-07-12 DIAGNOSIS — D509 Iron deficiency anemia, unspecified: Secondary | ICD-10-CM | POA: Insufficient documentation

## 2019-07-12 DIAGNOSIS — Z79899 Other long term (current) drug therapy: Secondary | ICD-10-CM | POA: Diagnosis not present

## 2019-07-12 DIAGNOSIS — Z90722 Acquired absence of ovaries, bilateral: Secondary | ICD-10-CM | POA: Diagnosis not present

## 2019-07-12 DIAGNOSIS — F209 Schizophrenia, unspecified: Secondary | ICD-10-CM | POA: Diagnosis not present

## 2019-07-12 LAB — CBC WITH DIFFERENTIAL/PLATELET
Abs Immature Granulocytes: 0.01 10*3/uL (ref 0.00–0.07)
Basophils Absolute: 0.1 10*3/uL (ref 0.0–0.1)
Basophils Relative: 1 %
Eosinophils Absolute: 0.3 10*3/uL (ref 0.0–0.5)
Eosinophils Relative: 5 %
HCT: 33.9 % — ABNORMAL LOW (ref 36.0–46.0)
Hemoglobin: 10.9 g/dL — ABNORMAL LOW (ref 12.0–15.0)
Immature Granulocytes: 0 %
Lymphocytes Relative: 23 %
Lymphs Abs: 1.2 10*3/uL (ref 0.7–4.0)
MCH: 35.9 pg — ABNORMAL HIGH (ref 26.0–34.0)
MCHC: 32.2 g/dL (ref 30.0–36.0)
MCV: 111.5 fL — ABNORMAL HIGH (ref 80.0–100.0)
Monocytes Absolute: 0.8 10*3/uL (ref 0.1–1.0)
Monocytes Relative: 15 %
Neutro Abs: 3 10*3/uL (ref 1.7–7.7)
Neutrophils Relative %: 56 %
Platelets: 322 10*3/uL (ref 150–400)
RBC: 3.04 MIL/uL — ABNORMAL LOW (ref 3.87–5.11)
RDW: 12.9 % (ref 11.5–15.5)
WBC: 5.4 10*3/uL (ref 4.0–10.5)
nRBC: 0 % (ref 0.0–0.2)

## 2019-07-12 LAB — COMPREHENSIVE METABOLIC PANEL
ALT: 22 U/L (ref 0–44)
AST: 30 U/L (ref 15–41)
Albumin: 4.6 g/dL (ref 3.5–5.0)
Alkaline Phosphatase: 88 U/L (ref 38–126)
Anion gap: 9 (ref 5–15)
BUN: 12 mg/dL (ref 8–23)
CO2: 27 mmol/L (ref 22–32)
Calcium: 9.9 mg/dL (ref 8.9–10.3)
Chloride: 103 mmol/L (ref 98–111)
Creatinine, Ser: 1.18 mg/dL — ABNORMAL HIGH (ref 0.44–1.00)
GFR calc Af Amer: 52 mL/min — ABNORMAL LOW (ref >60)
GFR calc non Af Amer: 45 mL/min — ABNORMAL LOW (ref >60)
Glucose, Bld: 97 mg/dL (ref 70–99)
Potassium: 4.2 mmol/L (ref 3.5–5.1)
Sodium: 139 mmol/L (ref 135–145)
Total Bilirubin: 0.7 mg/dL (ref 0.3–1.2)
Total Protein: 7.6 g/dL (ref 6.5–8.1)

## 2019-07-12 MED ORDER — IOHEXOL 300 MG/ML  SOLN
100.0000 mL | Freq: Once | INTRAMUSCULAR | Status: AC | PRN
  Administered 2019-07-12: 10:00:00 100 mL via INTRAVENOUS

## 2019-07-13 LAB — CA 125: Cancer Antigen (CA) 125: 6.5 U/mL (ref 0.0–38.1)

## 2019-07-16 ENCOUNTER — Inpatient Hospital Stay (HOSPITAL_BASED_OUTPATIENT_CLINIC_OR_DEPARTMENT_OTHER): Payer: Medicare Other | Admitting: Hematology

## 2019-07-16 ENCOUNTER — Encounter (HOSPITAL_COMMUNITY): Payer: Self-pay | Admitting: Hematology

## 2019-07-16 ENCOUNTER — Other Ambulatory Visit: Payer: Self-pay

## 2019-07-16 VITALS — BP 155/74 | HR 84 | Temp 97.3°F | Resp 18 | Wt 168.0 lb

## 2019-07-16 DIAGNOSIS — C562 Malignant neoplasm of left ovary: Secondary | ICD-10-CM

## 2019-07-16 DIAGNOSIS — C569 Malignant neoplasm of unspecified ovary: Secondary | ICD-10-CM | POA: Diagnosis not present

## 2019-07-16 NOTE — Patient Instructions (Signed)
[facility] at [facility] Discharge Instructions  You were seen today by Dr. Delton Coombes. He went over your recent lab and scan results. He will see you back in 3 months for labs and follow up.   Thank you for choosing [facility] at [facility] to provide your oncology and hematology care.  To afford each patient quality time with our provider, please arrive at least 15 minutes before your scheduled appointment time.   If you have a lab appointment with the [facility] please come in thru the  Main Entrance and check in at the main information desk  You need to re-schedule your appointment should you arrive 10 or more minutes late.  We strive to give you quality time with our providers, and arriving late affects you and other patients whose appointments are after yours.  Also, if you no show three or more times for appointments you may be dismissed from the clinic at the providers discretion.     Again, thank you for choosing [facility].  Our hope is that these requests will decrease the amount of time that you wait before being seen by our physicians.       _____________________________________________________________  Should you have questions after your visit to [facility], please contact our office at [phone] between the hours of 8:00 a.m. and 4:30 p.m.  Voicemails left after 4:00 p.m. will not be returned until the following business day.  For prescription refill requests, have your pharmacy contact our office and allow 72 hours.    Cancer Center Support Programs:   > Cancer Support Group  2nd Tuesday of the month 1pm-2pm, Journey Room

## 2019-07-19 NOTE — Progress Notes (Signed)
Latasha [name], [place] [address]   CLINIC:  Medical Oncology/Hematology  PCP:  Patient, No Pcp Per No address on file None   REASON FOR VISIT:  Follow-up for ovarian cancer   BRIEF ONCOLOGIC HISTORY:  Oncology History  Ovarian cancer ([place])  04/25/2015 - 04/28/2015 Hospital Admission   Nausea/diarrhea.  Oncology consult completed on 04/27/2015.   04/25/2015 Tumor Marker   CA 125- 3902.      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   04/27/2015 Procedure   US Paracentesis- A total of approximately 3700 mL of amber colored fluid was removed. A fluid sample was sent for laboratory analysis.   04/27/2015 Imaging   CT Chest- Mild left supraclavicular lymphadenopathy and moderate mediastinal lymphadenopathy involving the prevascular, subcarinal and bilateral pericardiophrenic nodal chains, likely metastatic.   04/27/2015 Pathology Results   Diagnosis PERITONEAL/ASCITIC FLUID(SPECIMEN 1 OF 1 COLLECTED 04/27/15): MALIGNANT CELLS CONSISTENT WITH METASTATIC ADENOCARCINOMA.  The immunophenotype is most consistent with a gynecologic primary, most likely ovary.   05/08/2015 Miscellaneous   Seen by Dr. Denman George- recommending Carboplatin/Paclitaxel x 3 cycles and return visit to see her (within 1 week of administration of third cycle) to evaluate for optimal sequencing of treatment modalities.   05/13/2015 - 05/15/2015 Hospital Admission   Hospitalized for AKI   05/19/2015 - 07/01/2015 Chemotherapy   Carboplatin/Paclitaxel x 3 cycles   07/28/2015 Surgery   Exploratory laparotomy with total abdominal hysterectomy, bilateral salpingo-oophorectomy, omentectomy radical tumor debulking for ovarian cancer; by Dr. Everitt Amber.   07/28/2015 Pathology Results   Uterus +/- tubes/ovaries, neoplastic, cervix - HIGH GRADE SEROUS  CARCINOMA INVOLVING LEFT OVARY, LEFT FALLOPIAN TUBE AND RIGHT FALLOPIAN TUBE. - CERVIX, ENDOMETRIUM AND MYOMETRIUM ARE FREE OF TUMOR. 2. Cul-de-sac biop...   08/26/2015 -  Chemotherapy   Carboplatin/Paclitaxel x 3 cycles   12/23/2015 Miscellaneous   Genetic counseling.  Genetic testing was normal, and did not reveal a deleterious mutation in these genes   03/07/2017 Imaging   CT C/A/P IMPRESSION: Chest Impression:  No evidence of metastatic disease in thorax.  Abdomen / Pelvis Impression:  1. Interval increase in size of solitary nodular peritoneal implant in the LEFT upper quadrant . 2. No additional evidence of peritoneal nodularity. 3. No ascites.   07/31/2017 Progression   CT C/A/P: IMPRESSION: 1. Left upper quadrant nodule continues to progress, now measuring 20 x 26 mm. 2. New nodule identified between the in adrenal gland, concerning for metastatic deposit. 3. Interval development of tiny nodules in the left lower quadrant mesenteric, concerning for metastases. 4. No ascites. 5. Status post total abdominal hysterectomy and omentectomy.      CANCER STAGING: Cancer Staging Ovarian cancer [facility]) Staging form: Ovary, AJCC 7th Edition - Clinical stage from 04/27/2015: FIGO Stage IVB, calculated as Stage IV (T3c, N1, M1) - Signed by Baird Cancer, PA-C on 09/16/2015 - Pathologic stage from 05/08/2015: FIGO Stage IVB, calculated as Stage IV (TX, N1, M1) - Signed by Everitt Amber, MD on 05/08/2015 - Pathologic stage from 07/28/2015: FIGO Stage IV (T3c, NX, M1) - Signed by Baird Cancer, PA-C on 09/16/2015    INTERVAL HISTORY:  Latasha [name] 78 y.o. female seen for follow-up of ovarian cancer.  She is tolerating niraparib very well.  Appetite is 100%.  Energy levels are 75%.  No pain reported.  Denies any nausea,  vomiting or diarrhea or constipation.  She does report occasional sleep problems.  Denies any fevers or infections.  No ER visits or hospitalizations.  No  abdominal pains reported.   REVIEW OF SYSTEMS:  Review of Systems  Psychiatric/Behavioral: Positive for sleep disturbance.  All other systems reviewed and are negative.    PAST MEDICAL/SURGICAL HISTORY:  Past Medical History:  Diagnosis Date  . Arthritis   . B12 deficiency   . Cataract   . History of blood transfusion   . History of chemotherapy   . Hypertension   . Iron deficiency anemia   . Mixed hyperlipidemia   . Ovarian cancer ([place])   . 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 Schizophrenia ([place])   . Ulcerative colitis ([place])   . Vitamin D deficiency    Past Surgical History:  Procedure Laterality Date  . ABDOMINAL HYSTERECTOMY N/A 07/28/2015   Procedure: TOTAL HYSTERECTOMY ABDOMINAL BILATERAL SALPINGO OOPHRORECTOMY;  Surgeon: Everitt Amber, MD;  Location: [facility] ORS;  Service: Gynecology;  Laterality: N/A;  . DEBULKING N/A 07/28/2015   Procedure: DEBULKING;  Surgeon: Everitt Amber, MD;  Location: [facility];  Service: Gynecology;  Laterality: N/A;  . LAPAROTOMY N/A 07/28/2015   Procedure: EXPLORATORY LAPAROTOMY;  Surgeon: Everitt Amber, MD;  Location: [facility];  Service: Gynecology;  Laterality: N/A;  . OMENTECTOMY N/A 07/28/2015   Procedure: OMENTECTOMY ;  Surgeon: Everitt Amber, MD;  Location: [facility];  Service: Gynecology;  Laterality: N/A;  . PARACENTESIS  04/27/15  . PORTACATH PLACEMENT Right 04/2015  . REPLACEMENT TOTAL KNEE     right knee in 2003     SOCIAL HISTORY:  Social History   Socioeconomic History  . Marital status: Single    Spouse name: Not on file  . Number of children: 1  . Years of education: Not on file  . Highest education level: Not on file  Occupational History  . Not on file  Social Needs  . Financial resource strain: Not on file  . Food insecurity    Worry: Not on file    Inability: Not on file  . Transportation needs    Medical: Not on file    Non-medical: Not on file  Tobacco Use  . Smoking status: Never Smoker  . Smokeless tobacco: Never Used  Substance and  Sexual Activity  . Alcohol use: No  . Drug use: No  . Sexual activity: Not Currently  Lifestyle  . Physical activity    Days per week: Not on file    Minutes per session: Not on file  . Stress: Not on file  Relationships  . Social Herbalist on phone: Not on file    Gets together: Not on file    Attends religious service: Not on file    Active member of club or organization: Not on file    Attends meetings of clubs or organizations: Not on file    Relationship status: Not on file  . Intimate partner violence    Fear of current or ex partner: Not on file    Emotionally abused: Not on file    Physically abused: Not on file    Forced sexual activity: Not on file  Other Topics Concern  . Not on file  Social History Narrative  . Not on file    FAMILY HISTORY:  Family History  Problem Relation Age of Onset  . Prostate cancer Brother   . Cancer Paternal Uncle        1  uncle with cancer NOS  . Lung cancer Maternal Grandmother   . Hypertension Maternal Grandfather   . Congestive Heart Failure Mother   . Seizures Sister     CURRENT MEDICATIONS:  Outpatient Encounter Medications as of 07/16/2019  Medication Sig Note  . amLODipine (NORVASC) 10 MG tablet Take 10 mg by mouth every evening.    Marland Kitchen aspirin EC 81 MG tablet Take 81 mg by mouth daily. 10/07/2015: taking  . Cholecalciferol (VITAMIN D-3) 1000 UNITS CAPS Take 1,000 Units by mouth daily.    Marland Kitchen docusate sodium (COLACE) 100 MG capsule Take 100 mg by mouth 2 (two) times daily.   . folic acid (FOLVITE) 1 MG tablet Take 1 mg by mouth daily.   Marland Kitchen lisinopril (PRINIVIL,ZESTRIL) 40 MG tablet Take 40 mg by mouth daily.    Marland Kitchen lovastatin (MEVACOR) 10 MG tablet Take 10 mg by mouth at bedtime.   . metoprolol tartrate (LOPRESSOR) 25 MG tablet Take 1 tablet (25 mg total) by mouth 2 (two) times daily.   . mirtazapine (REMERON) 15 MG tablet Take 7.5 mg by mouth at bedtime.   . Multiple Vitamin (MULTIVITAMIN WITH MINERALS) TABS tablet  Take 1 tablet by mouth daily.   . QUEtiapine (SEROQUEL XR) 300 MG 24 hr tablet Take 300 mg by mouth at bedtime.   . sulfaSALAzine (AZULFIDINE) 500 MG tablet Take 500 mg by mouth 3 (three) times daily.   . traMADol (ULTRAM) 50 MG tablet TAKE (1) TABLET BY MOUTH EVERY SIX HOURS AS NEEDED.   Marland Kitchen vitamin B-12 (CYANOCOBALAMIN) 1000 MCG tablet Take 1,000 mcg by mouth daily.   Marland Kitchen ZEJULA 100 MG CAPS TAKE 3 CAPSULES (300MG) BY MOUTH DAILY.   . [DISCONTINUED] cholecalciferol (VITAMIN D) 25 MCG (1000 UT) tablet    . acetaminophen (TYLENOL) 500 MG tablet Take 2 tablets (1,000 mg total) by mouth every 12 (twelve) hours. (Patient not taking: Reported on 07/16/2019)   . lidocaine-prilocaine (EMLA) cream Apply to affected area once (Patient not taking: Reported on 07/16/2019)   . ondansetron (ZOFRAN) 8 MG tablet Take 1 tablet (8 mg total) by mouth 2 (two) times daily as needed for refractory nausea / vomiting. Start on day 3 after chemo. (Patient not taking: Reported on 07/16/2019)   . prochlorperazine (COMPAZINE) 10 MG tablet Take 1 tablet (10 mg total) by mouth every 6 (six) hours as needed (Nausea or vomiting). (Patient not taking: Reported on 07/16/2019)    No facility-administered encounter medications on file as of 07/16/2019.     ALLERGIES:  No Known Allergies   PHYSICAL EXAM:  ECOG Performance status: 1  Vitals:   07/16/19 1052  BP: (!) 155/74  Pulse: 84  Resp: 18  Temp: (!) 97.3 F (36.3 C)  SpO2: 100%   Filed Weights   07/16/19 1052  Weight: 168 lb (76.2 kg)    Physical Exam Vitals signs reviewed.  Constitutional:      Appearance: Normal appearance.  Cardiovascular:     Rate and Rhythm: Normal rate and regular rhythm.     Heart sounds: Normal heart sounds.  Pulmonary:     Effort: Pulmonary effort is normal.     Breath sounds: Normal breath sounds.  Abdominal:     General: There is no distension.     Palpations: Abdomen is soft. There is no mass.  Musculoskeletal:         General: No swelling.  Skin:    General: Skin is warm.  Neurological:     General: No focal  deficit present.     Mental Status: She is alert.  Psychiatric:        Mood and Affect: Mood normal.        Behavior: Behavior normal.      LABORATORY DATA:  I have reviewed the labs as listed.  CBC    Component Value Date/Time   WBC 5.4 07/12/2019 0841   RBC 3.04 (L) 07/12/2019 0841   HGB 10.9 (L) 07/12/2019 0841   HCT 33.9 (L) 07/12/2019 0841   PLT 322 07/12/2019 0841   MCV 111.5 (H) 07/12/2019 0841   MCH 35.9 (H) 07/12/2019 0841   MCHC 32.2 07/12/2019 0841   RDW 12.9 07/12/2019 0841   LYMPHSABS 1.2 07/12/2019 0841   MONOABS 0.8 07/12/2019 0841   EOSABS 0.3 07/12/2019 0841   BASOSABS 0.1 07/12/2019 0841   CMP Latest Ref Rng & Units 07/12/2019 05/07/2019 03/11/2019  Glucose 70 - 99 mg/dL 97 92 104(H)  BUN 8 - 23 mg/dL 12 13 14   Creatinine 0.44 - 1.00 mg/dL 1.18(H) 1.24(H) 1.32(H)  Sodium 135 - 145 mmol/L 139 139 139  Potassium 3.5 - 5.1 mmol/L 4.2 4.3 4.4  Chloride 98 - 111 mmol/L 103 103 102  CO2 22 - 32 mmol/L 27 27 27   Calcium 8.9 - 10.3 mg/dL 9.9 9.5 9.7  Total Protein 6.5 - 8.1 g/dL 7.6 7.8 7.8  Total Bilirubin 0.3 - 1.2 mg/dL 0.7 0.5 0.6  Alkaline Phos 38 - 126 U/L 88 93 87  AST 15 - 41 U/L 30 30 33  ALT 0 - 44 U/L 22 24 29        DIAGNOSTIC IMAGING:  I have independently reviewed the scans and discussed with the patient.    ASSESSMENT & PLAN:   Ovarian cancer ([place]) 1.  Stage IVb ovarian cancer: - History of mediastinal adenopathy and malignant ascites. -She underwent 3 cycles of carboplatin and paclitaxel followed by debulking surgery by Dr. Denman George on 07/28/2015 followed by 3 more cycles finished on 10/07/2015. - Status post 6 cycles of carboplatin and paclitaxel from 08/17/2017 through 11/30/2017 in the second line setting. -Germline mutation testing for BRCA was negative. -She was started on niraparib 300 mg daily maintenance since April 2019.  Alanda Slim was  recommended until March/April 2021 by Dr. Denman George. - Last scans on 03/11/2019 did not show any progression. -She is continuing to tolerate niraparib very well.  Blood work and tumor markers are within normal limits.  She will continue niraparib until March 2021.  That will complete 2 years.  At that point we will make a decision to further continue it or not. -She will come back in 3 months for follow-up.  2.  Back pain and abdominal pain: -She takes tramadol 50 mg as needed.  3.  Hypertension: -Blood pressure today is 155/74.  Pulse rate is 84. -She will continue lisinopril and metoprolol.   Total time spent is 25 minutes with more than 50% of the time spent face-to-face discussing treatment plan and coordination of care.  Orders placed this encounter:  Orders Placed This Encounter  Procedures  . CBC with Differential/Platelet  . Comprehensive metabolic panel  . CA [place], MD [place] [phone]

## 2019-07-19 NOTE — Assessment & Plan Note (Signed)
1.  Stage IVb ovarian cancer: - History of mediastinal adenopathy and malignant ascites. -She underwent 3 cycles of carboplatin and paclitaxel followed by debulking surgery by Dr. Denman George on 07/28/2015 followed by 3 more cycles finished on 10/07/2015. - Status post 6 cycles of carboplatin and paclitaxel from 08/17/2017 through 11/30/2017 in the second line setting. -Germline mutation testing for BRCA was negative. -She was started on niraparib 300 mg daily maintenance since April 2019.  Alanda Slim was recommended until March/April 2021 by Dr. Denman George. - Last scans on 03/11/2019 did not show any progression. -She is continuing to tolerate niraparib very well.  Blood work and tumor markers are within normal limits.  She will continue niraparib until March 2021.  That will complete 2 years.  At that point we will make a decision to further continue it or not. -She will come back in 3 months for follow-up.  2.  Back pain and abdominal pain: -She takes tramadol 50 mg as needed.  3.  Hypertension: -Blood pressure today is 155/74.  Pulse rate is 84. -She will continue lisinopril and metoprolol.

## 2019-09-30 ENCOUNTER — Telehealth: Payer: Self-pay | Admitting: *Deleted

## 2019-09-30 NOTE — Telephone Encounter (Signed)
Patient called and scheduled a follow up appt for March

## 2019-10-15 ENCOUNTER — Inpatient Hospital Stay (HOSPITAL_COMMUNITY): Payer: Medicare Other | Attending: Hematology

## 2019-10-15 ENCOUNTER — Other Ambulatory Visit: Payer: Self-pay

## 2019-10-15 DIAGNOSIS — Z79899 Other long term (current) drug therapy: Secondary | ICD-10-CM | POA: Diagnosis not present

## 2019-10-15 DIAGNOSIS — E538 Deficiency of other specified B group vitamins: Secondary | ICD-10-CM | POA: Diagnosis not present

## 2019-10-15 DIAGNOSIS — Z90710 Acquired absence of both cervix and uterus: Secondary | ICD-10-CM | POA: Diagnosis not present

## 2019-10-15 DIAGNOSIS — D509 Iron deficiency anemia, unspecified: Secondary | ICD-10-CM | POA: Diagnosis not present

## 2019-10-15 DIAGNOSIS — E785 Hyperlipidemia, unspecified: Secondary | ICD-10-CM | POA: Diagnosis not present

## 2019-10-15 DIAGNOSIS — Z9221 Personal history of antineoplastic chemotherapy: Secondary | ICD-10-CM | POA: Insufficient documentation

## 2019-10-15 DIAGNOSIS — C569 Malignant neoplasm of unspecified ovary: Secondary | ICD-10-CM | POA: Diagnosis not present

## 2019-10-15 DIAGNOSIS — Z7982 Long term (current) use of aspirin: Secondary | ICD-10-CM | POA: Diagnosis not present

## 2019-10-15 DIAGNOSIS — F209 Schizophrenia, unspecified: Secondary | ICD-10-CM | POA: Insufficient documentation

## 2019-10-15 DIAGNOSIS — M1990 Unspecified osteoarthritis, unspecified site: Secondary | ICD-10-CM | POA: Diagnosis not present

## 2019-10-15 DIAGNOSIS — I10 Essential (primary) hypertension: Secondary | ICD-10-CM | POA: Diagnosis not present

## 2019-10-15 DIAGNOSIS — C562 Malignant neoplasm of left ovary: Secondary | ICD-10-CM

## 2019-10-15 DIAGNOSIS — E559 Vitamin D deficiency, unspecified: Secondary | ICD-10-CM | POA: Insufficient documentation

## 2019-10-15 DIAGNOSIS — Z90722 Acquired absence of ovaries, bilateral: Secondary | ICD-10-CM | POA: Diagnosis not present

## 2019-10-15 LAB — COMPREHENSIVE METABOLIC PANEL
ALT: 28 U/L (ref 0–44)
AST: 35 U/L (ref 15–41)
Albumin: 4.4 g/dL (ref 3.5–5.0)
Alkaline Phosphatase: 92 U/L (ref 38–126)
Anion gap: 7 (ref 5–15)
BUN: 15 mg/dL (ref 8–23)
CO2: 28 mmol/L (ref 22–32)
Calcium: 9.5 mg/dL (ref 8.9–10.3)
Chloride: 103 mmol/L (ref 98–111)
Creatinine, Ser: 1.17 mg/dL — ABNORMAL HIGH (ref 0.44–1.00)
GFR calc Af Amer: 52 mL/min — ABNORMAL LOW (ref >60)
GFR calc non Af Amer: 45 mL/min — ABNORMAL LOW (ref >60)
Glucose, Bld: 96 mg/dL (ref 70–99)
Potassium: 4.5 mmol/L (ref 3.5–5.1)
Sodium: 138 mmol/L (ref 135–145)
Total Bilirubin: 0.5 mg/dL (ref 0.3–1.2)
Total Protein: 7.4 g/dL (ref 6.5–8.1)

## 2019-10-15 LAB — CBC WITH DIFFERENTIAL/PLATELET
Abs Immature Granulocytes: 0.01 10*3/uL (ref 0.00–0.07)
Basophils Absolute: 0.1 10*3/uL (ref 0.0–0.1)
Basophils Relative: 1 %
Eosinophils Absolute: 0.3 10*3/uL (ref 0.0–0.5)
Eosinophils Relative: 6 %
HCT: 32.3 % — ABNORMAL LOW (ref 36.0–46.0)
Hemoglobin: 10.6 g/dL — ABNORMAL LOW (ref 12.0–15.0)
Immature Granulocytes: 0 %
Lymphocytes Relative: 29 %
Lymphs Abs: 1.4 10*3/uL (ref 0.7–4.0)
MCH: 36.6 pg — ABNORMAL HIGH (ref 26.0–34.0)
MCHC: 32.8 g/dL (ref 30.0–36.0)
MCV: 111.4 fL — ABNORMAL HIGH (ref 80.0–100.0)
Monocytes Absolute: 0.8 10*3/uL (ref 0.1–1.0)
Monocytes Relative: 16 %
Neutro Abs: 2.3 10*3/uL (ref 1.7–7.7)
Neutrophils Relative %: 48 %
Platelets: 300 10*3/uL (ref 150–400)
RBC: 2.9 MIL/uL — ABNORMAL LOW (ref 3.87–5.11)
RDW: 12.7 % (ref 11.5–15.5)
WBC: 4.8 10*3/uL (ref 4.0–10.5)
nRBC: 0 % (ref 0.0–0.2)

## 2019-10-16 LAB — CA 125: Cancer Antigen (CA) 125: 6.2 U/mL (ref 0.0–38.1)

## 2019-10-22 ENCOUNTER — Other Ambulatory Visit: Payer: Self-pay

## 2019-10-22 ENCOUNTER — Other Ambulatory Visit (HOSPITAL_COMMUNITY): Payer: Self-pay | Admitting: *Deleted

## 2019-10-22 ENCOUNTER — Inpatient Hospital Stay (HOSPITAL_COMMUNITY): Payer: Medicare Other | Attending: Hematology | Admitting: Hematology

## 2019-10-22 VITALS — BP 136/71 | HR 76 | Temp 97.1°F | Resp 18 | Wt 169.0 lb

## 2019-10-22 DIAGNOSIS — Z8042 Family history of malignant neoplasm of prostate: Secondary | ICD-10-CM | POA: Insufficient documentation

## 2019-10-22 DIAGNOSIS — Z79899 Other long term (current) drug therapy: Secondary | ICD-10-CM | POA: Insufficient documentation

## 2019-10-22 DIAGNOSIS — Z7982 Long term (current) use of aspirin: Secondary | ICD-10-CM | POA: Insufficient documentation

## 2019-10-22 DIAGNOSIS — R7989 Other specified abnormal findings of blood chemistry: Secondary | ICD-10-CM | POA: Diagnosis not present

## 2019-10-22 DIAGNOSIS — I10 Essential (primary) hypertension: Secondary | ICD-10-CM | POA: Insufficient documentation

## 2019-10-22 DIAGNOSIS — Z9079 Acquired absence of other genital organ(s): Secondary | ICD-10-CM | POA: Diagnosis not present

## 2019-10-22 DIAGNOSIS — C562 Malignant neoplasm of left ovary: Secondary | ICD-10-CM

## 2019-10-22 DIAGNOSIS — Z801 Family history of malignant neoplasm of trachea, bronchus and lung: Secondary | ICD-10-CM | POA: Diagnosis not present

## 2019-10-22 DIAGNOSIS — D649 Anemia, unspecified: Secondary | ICD-10-CM | POA: Diagnosis not present

## 2019-10-22 DIAGNOSIS — M549 Dorsalgia, unspecified: Secondary | ICD-10-CM | POA: Diagnosis not present

## 2019-10-22 DIAGNOSIS — Z90722 Acquired absence of ovaries, bilateral: Secondary | ICD-10-CM | POA: Diagnosis not present

## 2019-10-22 DIAGNOSIS — Z90710 Acquired absence of both cervix and uterus: Secondary | ICD-10-CM | POA: Insufficient documentation

## 2019-10-22 DIAGNOSIS — E782 Mixed hyperlipidemia: Secondary | ICD-10-CM | POA: Insufficient documentation

## 2019-10-22 DIAGNOSIS — Z8249 Family history of ischemic heart disease and other diseases of the circulatory system: Secondary | ICD-10-CM | POA: Insufficient documentation

## 2019-10-22 DIAGNOSIS — F209 Schizophrenia, unspecified: Secondary | ICD-10-CM | POA: Diagnosis not present

## 2019-10-22 DIAGNOSIS — Z8543 Personal history of malignant neoplasm of ovary: Secondary | ICD-10-CM | POA: Insufficient documentation

## 2019-10-22 DIAGNOSIS — R109 Unspecified abdominal pain: Secondary | ICD-10-CM | POA: Diagnosis not present

## 2019-10-22 MED ORDER — TRAMADOL HCL 50 MG PO TABS: ORAL_TABLET | ORAL | 0 refills | Status: DC

## 2019-10-22 NOTE — Patient Instructions (Signed)
[place] at [facility] Discharge Instructions  You were seen today by Dr. Delton Coombes. He went over your recent lab results. He will repeat scans prior to your next visit. He will see you back in 8 weeks for labs and follow up.   Thank you for choosing [place] at [facility] to provide your oncology and hematology care.  To afford each patient quality time with our provider, please arrive at least 15 minutes before your scheduled appointment time.   If you have a lab appointment with the [place] please come in thru the  Main Entrance and check in at the main information desk  You need to re-schedule your appointment should you arrive 10 or more minutes late.  We strive to give you quality time with our providers, and arriving late affects you and other patients whose appointments are after yours.  Also, if you no show three or more times for appointments you may be dismissed from the clinic at the providers discretion.     Again, thank you for choosing [facility].  Our hope is that these requests will decrease the amount of time that you wait before being seen by our physicians.       _____________________________________________________________  Should you have questions after your visit to [facility], please contact our office at [phone] between the hours of 8:00 a.m. and 4:30 p.m.  Voicemails left after 4:00 p.m. will not be returned until the following business day.  For prescription refill requests, have your pharmacy contact our office and allow 72 hours.    Cancer Center Support Programs:   > Cancer Support Group  2nd Tuesday of the month 1pm-2pm, Journey Room

## 2019-10-26 NOTE — Progress Notes (Signed)
Latasha [name], Latasha [name] [id number]   CLINIC:  Medical Oncology/Hematology  PCP:  Patient, No Pcp Per No address on file None   REASON FOR VISIT:  Follow-up for ovarian cancer   BRIEF ONCOLOGIC HISTORY:  Oncology History  Ovarian cancer ([place])  04/25/2015 - 04/28/2015 Hospital Admission   Nausea/diarrhea.  Oncology consult completed on 04/27/2015.   04/25/2015 Tumor Marker   CA 125- 3902.      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   04/27/2015 Procedure   US Paracentesis- A total of approximately 3700 mL of amber colored fluid was removed. A fluid sample was sent for laboratory analysis.   04/27/2015 Imaging   CT Chest- Mild left supraclavicular lymphadenopathy and moderate mediastinal lymphadenopathy involving the prevascular, subcarinal and bilateral pericardiophrenic nodal chains, likely metastatic.   04/27/2015 Pathology Results   Diagnosis PERITONEAL/ASCITIC FLUID(SPECIMEN 1 OF 1 COLLECTED 04/27/15): MALIGNANT CELLS CONSISTENT WITH METASTATIC ADENOCARCINOMA.  The immunophenotype is most consistent with a gynecologic primary, most likely ovary.   05/08/2015 Miscellaneous   Seen by Dr. Denman George- recommending Carboplatin/Paclitaxel x 3 cycles and return visit to see her (within 1 week of administration of third cycle) to evaluate for optimal sequencing of treatment modalities.   05/13/2015 - 05/15/2015 Hospital Admission   Hospitalized for AKI   05/19/2015 - 07/01/2015 Chemotherapy   Carboplatin/Paclitaxel x 3 cycles   07/28/2015 Surgery   Exploratory laparotomy with total abdominal hysterectomy, bilateral salpingo-oophorectomy, omentectomy radical tumor debulking for ovarian cancer; by Dr. Everitt Amber.   07/28/2015 Pathology Results   Uterus +/- tubes/ovaries, neoplastic, cervix - HIGH GRADE SEROUS  CARCINOMA INVOLVING LEFT OVARY, LEFT FALLOPIAN TUBE AND RIGHT FALLOPIAN TUBE. - CERVIX, ENDOMETRIUM AND MYOMETRIUM ARE FREE OF TUMOR. 2. Cul-de-sac biop...   08/26/2015 -  Chemotherapy   Carboplatin/Paclitaxel x 3 cycles   12/23/2015 Miscellaneous   Genetic counseling.  Genetic testing was normal, and did not reveal a deleterious mutation in these genes   03/07/2017 Imaging   CT C/A/P IMPRESSION: Chest Impression:  No evidence of metastatic disease in thorax.  Abdomen / Pelvis Impression:  1. Interval increase in size of solitary nodular peritoneal implant in the LEFT upper quadrant . 2. No additional evidence of peritoneal nodularity. 3. No ascites.   07/31/2017 Progression   CT C/A/P: IMPRESSION: 1. Left upper quadrant nodule continues to progress, now measuring 20 x 26 mm. 2. New nodule identified between the in adrenal gland, concerning for metastatic deposit. 3. Interval development of tiny nodules in the left lower quadrant mesenteric, concerning for metastases. 4. No ascites. 5. Status post total abdominal hysterectomy and omentectomy.      CANCER STAGING: Cancer Staging Ovarian cancer [facility]) Staging form: Ovary, AJCC 7th Edition - Clinical stage from 04/27/2015: FIGO Stage IVB, calculated as Stage IV (T3c, N1, M1) - Signed by Baird Cancer, PA-C on 09/16/2015 - Pathologic stage from 05/08/2015: FIGO Stage IVB, calculated as Stage IV (TX, N1, M1) - Signed by Everitt Amber, MD on 05/08/2015 - Pathologic stage from 07/28/2015: FIGO Stage IV (T3c, NX, M1) - Signed by Baird Cancer, PA-C on 09/16/2015    INTERVAL HISTORY:  Ms. Mullinax 79 y.o. female seen for follow-up of her recurrent ovarian cancer.  She is continuing to tolerate niraparib very well.  Appetite is 100%.  Energy levels are 75%.  No abdominal pain  or bloating reported.  She takes tramadol as needed for back and abdominal pains.  Denies any GI symptoms including nausea, vomiting, diarrhea or  constipation.  No fevers or infections reported.   REVIEW OF SYSTEMS:  Review of Systems  All other systems reviewed and are negative.    PAST MEDICAL/SURGICAL HISTORY:  Past Medical History:  Diagnosis Date  . Arthritis   . B12 deficiency   . Cataract   . History of blood transfusion   . History of chemotherapy   . Hypertension   . Iron deficiency anemia   . Mixed hyperlipidemia   . Ovarian cancer ([place])   . 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 Schizophrenia ([place])   . Ulcerative colitis ([place])   . Vitamin D deficiency    Past Surgical History:  Procedure Laterality Date  . ABDOMINAL HYSTERECTOMY N/A 07/28/2015   Procedure: TOTAL HYSTERECTOMY ABDOMINAL BILATERAL SALPINGO OOPHRORECTOMY;  Surgeon: Everitt Amber, MD;  Location: [facility];  Service: Gynecology;  Laterality: N/A;  . DEBULKING N/A 07/28/2015   Procedure: DEBULKING;  Surgeon: Everitt Amber, MD;  Location: [facility];  Service: Gynecology;  Laterality: N/A;  . LAPAROTOMY N/A 07/28/2015   Procedure: EXPLORATORY LAPAROTOMY;  Surgeon: Everitt Amber, MD;  Location: [facility];  Service: Gynecology;  Laterality: N/A;  . OMENTECTOMY N/A 07/28/2015   Procedure: OMENTECTOMY ;  Surgeon: Everitt Amber, MD;  Location: [facility];  Service: Gynecology;  Laterality: N/A;  . PARACENTESIS  04/27/15  . PORTACATH PLACEMENT Right 04/2015  . REPLACEMENT TOTAL KNEE     right knee in 2003     SOCIAL HISTORY:  Social History   Socioeconomic History  . Marital status: Single    Spouse name: Not on file  . Number of children: 1  . Years of education: Not on file  . Highest education level: Not on file  Occupational History  . Not on file  Tobacco Use  . Smoking status: Never Smoker  . Smokeless tobacco: Never Used  Substance and Sexual Activity  . Alcohol use: No  . Drug use: No  . Sexual activity: Not Currently  Other Topics Concern  . Not on file  Social History Narrative  . Not on file   Social Determinants of Health   Financial Resource Strain:   .  Difficulty of Paying Living Expenses: Not on file  Food Insecurity:   . Worried About Charity fundraiser in the Last Year: Not on file  . Ran Out of Food in the Last Year: Not on file  Transportation Needs:   . Lack of Transportation (Medical): Not on file  . Lack of Transportation (Non-Medical): Not on file  Physical Activity:   . Days of Exercise per Week: Not on file  . Minutes of Exercise per Session: Not on file  Stress:   . Feeling of Stress : Not on file  Social Connections:   . Frequency of Communication with Friends and Family: Not on file  . Frequency of Social Gatherings with Friends and Family: Not on file  . Attends Religious Services: Not on file  . Active Member of Clubs or Organizations: Not on file  . Attends Archivist Meetings: Not on file  . Marital Status: Not on file  Intimate Partner Violence:   . Fear of Current or Ex-Partner: Not on file  . Emotionally Abused: Not on file  . Physically Abused: Not on file  . Sexually Abused: Not on file    FAMILY HISTORY:  Family  History  Problem Relation Age of Onset  . Prostate cancer Brother   . Cancer Paternal Uncle        1 uncle with cancer NOS  . Lung cancer Maternal Grandmother   . Hypertension Maternal Grandfather   . Congestive Heart Failure Mother   . Seizures Sister     CURRENT MEDICATIONS:  Outpatient Encounter Medications as of 10/22/2019  Medication Sig  . amLODipine (NORVASC) 10 MG tablet Take 10 mg by mouth every evening.   Marland Kitchen aspirin EC 81 MG tablet Take 81 mg by mouth daily.  . cholecalciferol (VITAMIN D) 25 MCG (1000 UNIT) tablet Take 1,000 Units by mouth daily.  . Cholecalciferol (VITAMIN D-3) 1000 UNITS CAPS Take 1,000 Units by mouth daily.   Marland Kitchen docusate sodium (COLACE) 100 MG capsule Take 100 mg by mouth 2 (two) times daily.  . folic acid (FOLVITE) 1 MG tablet Take 1 mg by mouth daily.  Marland Kitchen lisinopril (PRINIVIL,ZESTRIL) 40 MG tablet Take 40 mg by mouth daily.   Marland Kitchen lovastatin (MEVACOR)  10 MG tablet Take 10 mg by mouth at bedtime.  . metoprolol tartrate (LOPRESSOR) 25 MG tablet Take 1 tablet (25 mg total) by mouth 2 (two) times daily.  . mirtazapine (REMERON) 15 MG tablet Take 7.5 mg by mouth at bedtime.  . Multiple Vitamin (MULTIVITAMIN WITH MINERALS) TABS tablet Take 1 tablet by mouth daily.  . QUEtiapine (SEROQUEL XR) 300 MG 24 hr tablet Take 300 mg by mouth at bedtime.  . sulfaSALAzine (AZULFIDINE) 500 MG tablet Take 500 mg by mouth 3 (three) times daily.  Marland Kitchen ZEJULA 100 MG CAPS TAKE 3 CAPSULES (300MG) BY MOUTH DAILY.  Marland Kitchen acetaminophen (TYLENOL) 500 MG tablet Take 2 tablets (1,000 mg total) by mouth every 12 (twelve) hours. (Patient not taking: Reported on 07/16/2019)  . lidocaine-prilocaine (EMLA) cream Apply to affected area once (Patient not taking: Reported on 07/16/2019)  . ondansetron (ZOFRAN) 8 MG tablet Take 1 tablet (8 mg total) by mouth 2 (two) times daily as needed for refractory nausea / vomiting. Start on day 3 after chemo. (Patient not taking: Reported on 07/16/2019)  . prochlorperazine (COMPAZINE) 10 MG tablet Take 1 tablet (10 mg total) by mouth every 6 (six) hours as needed (Nausea or vomiting). (Patient not taking: Reported on 07/16/2019)  . [DISCONTINUED] traMADol (ULTRAM) 50 MG tablet TAKE (1) TABLET BY MOUTH EVERY SIX HOURS AS NEEDED. (Patient not taking: Reported on 10/22/2019)  . [DISCONTINUED] vitamin B-12 (CYANOCOBALAMIN) 1000 MCG tablet Take 1,000 mcg by mouth daily.   No facility-administered encounter medications on file as of 10/22/2019.    ALLERGIES:  No Known Allergies   PHYSICAL EXAM:  ECOG Performance status: 1  Vitals:   10/22/19 1102  BP: 136/71  Pulse: 76  Resp: 18  Temp: (!) 97.1 F (36.2 C)  SpO2: 97%   Filed Weights   10/22/19 1102  Weight: 169 lb (76.7 kg)    Physical Exam Vitals reviewed.  Constitutional:      Appearance: Normal appearance.  Cardiovascular:     Rate and Rhythm: Normal rate and regular rhythm.      Heart sounds: Normal heart sounds.  Pulmonary:     Effort: Pulmonary effort is normal.     Breath sounds: Normal breath sounds.  Abdominal:     General: There is no distension.     Palpations: Abdomen is soft. There is no mass.  Musculoskeletal:        General: No swelling.  Skin:  General: Skin is warm.  Neurological:     General: No focal deficit present.     Mental Status: She is alert.  Psychiatric:        Mood and Affect: Mood normal.        Behavior: Behavior normal.      LABORATORY DATA:  I have reviewed the labs as listed.  CBC    Component Value Date/Time   WBC 4.8 10/15/2019 1020   RBC 2.90 (L) 10/15/2019 1020   HGB 10.6 (L) 10/15/2019 1020   HCT 32.3 (L) 10/15/2019 1020   PLT 300 10/15/2019 1020   MCV 111.4 (H) 10/15/2019 1020   MCH 36.6 (H) 10/15/2019 1020   MCHC 32.8 10/15/2019 1020   RDW 12.7 10/15/2019 1020   LYMPHSABS 1.4 10/15/2019 1020   MONOABS 0.8 10/15/2019 1020   EOSABS 0.3 10/15/2019 1020   BASOSABS 0.1 10/15/2019 1020   CMP Latest Ref Rng & Units 10/15/2019 07/12/2019 05/07/2019  Glucose 70 - 99 mg/dL 96 97 92  BUN 8 - 23 mg/dL 15 12 13   Creatinine 0.44 - 1.00 mg/dL 1.17(H) 1.18(H) 1.24(H)  Sodium 135 - 145 mmol/L 138 139 139  Potassium 3.5 - 5.1 mmol/L 4.5 4.2 4.3  Chloride 98 - 111 mmol/L 103 103 103  CO2 22 - 32 mmol/L 28 27 27   Calcium 8.9 - 10.3 mg/dL 9.5 9.9 9.5  Total Protein 6.5 - 8.1 g/dL 7.4 7.6 7.8  Total Bilirubin 0.3 - 1.2 mg/dL 0.5 0.7 0.5  Alkaline Phos 38 - 126 U/L 92 88 93  AST 15 - 41 U/L 35 30 30  ALT 0 - 44 U/L 28 22 24        DIAGNOSTIC IMAGING:  I have independently reviewed the scans and discussed with the patient.    ASSESSMENT & PLAN:   Ovarian cancer ([place]) 1.Stage IVb ovarian cancer: -History of mediastinal adenopathy and malignant ascites. -Status post 3 cycles of carboplatin and paclitaxel followed by debulking surgery by Dr. Denman George on 07/28/2015 followed by 3 more cycles finished on  10/07/2015. -Status post 6 cycles of carboplatin and paclitaxel from 08/17/2017 through 11/30/2017 and the second line setting. -Germline mutation testing for BRCA was negative. -Niraparib 300 mg daily maintenance started in April 2019. -CT scan of the abdomen and pelvis on 07/12/2019 reviewed by me did not show any progression. -She is tolerating niraparib reasonably well.  Physical exam today did not reveal any palpable masses or distention. -I have recommended CT scan of the abdomen and pelvis in 8 weeks.  We will make a decision whether to continue niraparib or to stop it in March 2021 which will complete 2 years. -I have reviewed her CBC.  Mild anemia from bone marrow suppression with hemoglobin 10.6.  CA-125 is 6.2.  LFTs are grossly normal.  I will see her back in 8 weeks with CT scan.  She also has follow-up with Dr. Denman George.  2.  Elevated creatinine: -He has mildly elevated creatinine of 1.17.  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  We will closely monitor it.  3.  Back pain and abdominal pain: -She will take tramadol 50 mg as needed.  4.  Hypertension: -Blood pressure today is 136/71 and well controlled. -She will continue lisinopril and metoprolol.    Orders placed this encounter:  Orders Placed This Encounter  Procedures  . CT Chest W Contrast  . CT Abdomen Pelvis W Contrast  . CBC with Differential/Platelet  . Comprehensive metabolic panel  . CA 125  Derek Jack, MD [place] [phone]

## 2019-10-26 NOTE — Assessment & Plan Note (Signed)
1.Stage IVb ovarian cancer: -History of mediastinal adenopathy and malignant ascites. -Status post 3 cycles of carboplatin and paclitaxel followed by debulking surgery by Dr. Denman George on 07/28/2015 followed by 3 more cycles finished on 10/07/2015. -Status post 6 cycles of carboplatin and paclitaxel from 08/17/2017 through 11/30/2017 and the second line setting. -Germline mutation testing for BRCA was negative. -Niraparib 300 mg daily maintenance started in April 2019. -CT scan of the abdomen and pelvis on 07/12/2019 reviewed by me did not show any progression. -She is tolerating niraparib reasonably well.  Physical exam today did not reveal any palpable masses or distention. -I have recommended CT scan of the abdomen and pelvis in 8 weeks.  We will make a decision whether to continue niraparib or to stop it in March 2021 which will complete 2 years. -I have reviewed her CBC.  Mild anemia from bone marrow suppression with hemoglobin 10.6.  CA-125 is 6.2.  LFTs are grossly normal.  I will see her back in 8 weeks with CT scan.  She also has follow-up with Dr. Denman George.  2.  Elevated creatinine: -He has mildly elevated creatinine of 1.17.  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  We will closely monitor it.  3.  Back pain and abdominal pain: -She will take tramadol 50 mg as needed.  4.  Hypertension: -Blood pressure today is 136/71 and well controlled. -She will continue lisinopril and metoprolol.

## 2019-10-31 ENCOUNTER — Encounter: Payer: Self-pay | Admitting: Genetic Counselor

## 2019-11-19 ENCOUNTER — Other Ambulatory Visit: Payer: Self-pay

## 2019-11-19 ENCOUNTER — Encounter: Payer: Self-pay | Admitting: Gynecologic Oncology

## 2019-11-19 ENCOUNTER — Inpatient Hospital Stay: Payer: Medicare Other | Attending: Gynecologic Oncology | Admitting: Gynecologic Oncology

## 2019-11-19 VITALS — BP 152/85 | HR 78 | Temp 98.5°F | Resp 16 | Ht 66.0 in | Wt 167.4 lb

## 2019-11-19 DIAGNOSIS — Z79899 Other long term (current) drug therapy: Secondary | ICD-10-CM | POA: Insufficient documentation

## 2019-11-19 DIAGNOSIS — Z90710 Acquired absence of both cervix and uterus: Secondary | ICD-10-CM

## 2019-11-19 DIAGNOSIS — C786 Secondary malignant neoplasm of retroperitoneum and peritoneum: Secondary | ICD-10-CM | POA: Diagnosis not present

## 2019-11-19 DIAGNOSIS — Z90722 Acquired absence of ovaries, bilateral: Secondary | ICD-10-CM | POA: Diagnosis not present

## 2019-11-19 DIAGNOSIS — C569 Malignant neoplasm of unspecified ovary: Secondary | ICD-10-CM | POA: Insufficient documentation

## 2019-11-19 DIAGNOSIS — Z9221 Personal history of antineoplastic chemotherapy: Secondary | ICD-10-CM | POA: Diagnosis not present

## 2019-11-19 NOTE — Progress Notes (Signed)
Followup Note: Gyn-Onc  Larina Bras 79 y.o. 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 Malignant neoplasm of ovary, unspecified laterality ([place])    Follow up    Assessment/Plan:  Ms. Jassmine Vandruff  is a 79 y.o.  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 BRCA negative on testing.  S/p salvage 2nd line chemotherapy with carb/taxol completed 11/30/17. Now on Niraparib since March, 2019.  No measurable disease.  Tolerating maintenance PARPi with Zejula. Recommend continuing this indefinitely (as she is tolerating treatment well and is disease free).  I will see Jace back in 6 months for a pelvic exam as part of surveillance.   HPI: Katrin Grabel is a very pleasant 79 year old G1 who is seena in consultation at the request of Dr Whitney Muse for (clinical) stage IVB ovarian cancer. The patient developed symptoms of progressive abdominal fullness and discomfort over the course of several months. In August, 2016 she presented to the [facility] where imaging of the abdomen and pelvis was obtained for diarrhea, nausea and emesis. Imaging on 04/25/15 revealed large volume ascites, carcinomatosis and peritoneal masses measuring up to 5.1cm. The patient underwent paracentesis with cytology on 04/27/15 which revealed metastatic adenocarcinoma consistent on immunostains with gyn primary.  CT of the chest on 04/27/15 revealed mild left supraclavicular lymphadenopathy, and mediastinal lymphadenopathy consistent with metastatic disease. There were <20m pulmonary nodules also seen which were indeterminant. There were trace pleural effusions seen.   CA 125 on 04/25/15: 3902.  She then went on to receive 3 cycles of paclitaxel and carboplatin chemotherapy with Dr PWhitney Muse Cycle 3 was on 07/01/15.  CA 125 on 07/01/15 was 249.  CT scan on 07/20/15 showed: Previously noted ascites has resolved. No  pneumoperitoneum. Many of the previously noted peritoneal implants are no longer confidently identified on today's examination. There are few smaller implants noted, the largest of which is in th  left side of the abdomen anteriorly measuring 9 x 11 mm. There is also a 10 x 7 mm lesion superficial to the splenic flexure of the colon which is substantially smaller than the prior study (previously 2.8 x 1.7 cm).  There was resolution of the chest adenopathy.  She has tolerated chemotherapy well with a good appetites and good general function.  On 07/31/15 she was taken to the OR for an ex lap, TAH, BSO, omentectomy, radical tumor debulking. Intraoperative findings included: Excellent response to neoadjuvant chemotherapy. Small volume plaques/nodules in left and right posterior cul de sac behind uterus, tumor on left ovary (3cm), multiple 2cm nodules in omentum. No other peritoneal disease. Diaphragm normal.  She had an optimal cytoreduction (R0) with no gross visible disease remaining.  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  Postoperatively she did very well with no postoperative complications.  She went on to receive 3 additional cycles of carboplatin and paclitaxel completed April, 2017. She tolerated therapy very well.  Post treatment scans were unremarkable and showed no residual disease.  Her CA 125 was monitored at 3 monthly intervals. In January, 2018 it was normal at 17. On April, 20th ,2018 it had increased to 32. On June 15th, 2018 it had increased again to 47. CT chest/abdo/pelvis on 03/07/17 showed solitary pertoneal nodule in the gastrosplenic ligament measuring 133mx 1619mincreased from a prior scan where it was 1cm). No additional nodularity is noted  nor ascites.  She was offered secondary cytoreduction surgery followed by chemotherapy, however she declined this and opted for expectant management because she felt good.  On 07/31/17  repeat CT abdo/pelvis was performed. It showed progression of peritoneal disease with an enlargement of a peri-splenic lesion, a new nodule adjacent to the adrenal gland and a new left lower quadrant peritoneal metastases.  She agreed to commend salvage 2nd line chemotherapy with carboplatin and paclitaxel in December, 2018 due to the onset of symptomatic recurrence. She completed 6 cycles of this on 11/30/17 and was then changed to Niraparib maintenance 315m daily starting in March, 2019.  CA 125 was normal at 9 on 03/26/18.  CT scan abd/pelvis in July, 2019 was normal with no evidence of recurrent/progressive disease.  CT scan in December, 2019 was stable with no progression.  CA 125 on 11/15/18 was normal and stable at 6.7. CT scan abd/pelvis on 11/15/18 showed no new progression or recurrence.   Interval Hx:  CA 125 on 01/22/19 was normal at 6. CA 125 on 03/11/19 was normal at 6.7. CA 125 on 05/07/19 was normal at 6.9. CA 125  On 10/15/19 was normal at 6.2.  CT abd/pelvis on 07/12/19 showed no evidence of recurrent/progressive disease.  She continues to feel well with no abdominal symptoms and an excellent performance status (ECOG 0).  She denies thrombocytopenia. She has mild occasional nausea.   Current Meds:  Outpatient Encounter Medications as of 11/19/2019  Medication Sig  . acetaminophen (TYLENOL) 500 MG tablet Take 2 tablets (1,000 mg total) by mouth every 12 (twelve) hours.  .Marland KitchenamLODipine (NORVASC) 10 MG tablet Take 10 mg by mouth every evening.   .Marland Kitchenaspirin EC 81 MG tablet Take 81 mg by mouth daily.  . cholecalciferol (VITAMIN D) 25 MCG (1000 UNIT) tablet Take 1,000 Units by mouth daily.  . Cholecalciferol (VITAMIN D-3) 1000 UNITS CAPS Take 1,000 Units by mouth daily.   .Marland Kitchendocusate sodium (COLACE) 100 MG capsule Take 100 mg by mouth 2 (two) times daily.  . folic acid (FOLVITE) 1 MG tablet Take 1 mg by mouth daily.  .Marland Kitchenlidocaine-prilocaine (EMLA) cream Apply to affected area once  .  lisinopril (PRINIVIL,ZESTRIL) 40 MG tablet Take 40 mg by mouth daily.   .Marland Kitchenlovastatin (MEVACOR) 10 MG tablet Take 10 mg by mouth at bedtime.  . metoprolol tartrate (LOPRESSOR) 25 MG tablet Take 1 tablet (25 mg total) by mouth 2 (two) times daily.  . mirtazapine (REMERON) 15 MG tablet Take 7.5 mg by mouth at bedtime.  . Multiple Vitamin (MULTIVITAMIN WITH MINERALS) TABS tablet Take 1 tablet by mouth daily.  . ondansetron (ZOFRAN) 8 MG tablet Take 1 tablet (8 mg total) by mouth 2 (two) times daily as needed for refractory nausea / vomiting. Start on day 3 after chemo.  . prochlorperazine (COMPAZINE) 10 MG tablet Take 1 tablet (10 mg total) by mouth every 6 (six) hours as needed (Nausea or vomiting).  . QUEtiapine (SEROQUEL XR) 300 MG 24 hr tablet Take 300 mg by mouth at bedtime.  . sulfaSALAzine (AZULFIDINE) 500 MG tablet Take 500 mg by mouth 3 (three) times daily.  . traMADol (ULTRAM) 50 MG tablet TAKE (1) TABLET BY MOUTH EVERY SIX HOURS AS NEEDED.  .Marland KitchenZEJULA 100 MG CAPS TAKE 3 CAPSULES (300MG) BY MOUTH DAILY.   No facility-administered encounter medications on file as of 11/19/2019.    Allergy: No Known Allergies  Social Hx:   Social History   Socioeconomic History  .  Marital status: Single    Spouse name: Not on file  . Number of children: 1  . Years of education: Not on file  . Highest education level: Not on file  Occupational History  . Not on file  Tobacco Use  . Smoking status: Never Smoker  . Smokeless tobacco: Never Used  Substance and Sexual Activity  . Alcohol use: No  . Drug use: No  . Sexual activity: Not Currently  Other Topics Concern  . Not on file  Social History Narrative  . Not on file   Social Determinants of Health   Financial Resource Strain:   . Difficulty of Paying Living Expenses: Not on file  Food Insecurity:   . Worried About Charity fundraiser in the Last Year: Not on file  . Ran Out of Food in the Last Year: Not on file  Transportation Needs:    . Lack of Transportation (Medical): Not on file  . Lack of Transportation (Non-Medical): Not on file  Physical Activity:   . Days of Exercise per Week: Not on file  . Minutes of Exercise per Session: Not on file  Stress:   . Feeling of Stress : Not on file  Social Connections:   . Frequency of Communication with Friends and Family: Not on file  . Frequency of Social Gatherings with Friends and Family: Not on file  . Attends Religious Services: Not on file  . Active Member of Clubs or Organizations: Not on file  . Attends Archivist Meetings: Not on file  . Marital Status: Not on file  Intimate Partner Violence:   . Fear of Current or Ex-Partner: Not on file  . Emotionally Abused: Not on file  . Physically Abused: Not on file  . Sexually Abused: Not on file    Past Surgical Hx:  Past Surgical History:  Procedure Laterality Date  . ABDOMINAL HYSTERECTOMY N/A 07/28/2015   Procedure: TOTAL HYSTERECTOMY ABDOMINAL BILATERAL SALPINGO OOPHRORECTOMY;  Surgeon: Everitt Amber, MD;  Location: [facility];  Service: Gynecology;  Laterality: N/A;  . DEBULKING N/A 07/28/2015   Procedure: DEBULKING;  Surgeon: Everitt Amber, MD;  Location: [facility];  Service: Gynecology;  Laterality: N/A;  . LAPAROTOMY N/A 07/28/2015   Procedure: EXPLORATORY LAPAROTOMY;  Surgeon: Everitt Amber, MD;  Location: [facility];  Service: Gynecology;  Laterality: N/A;  . OMENTECTOMY N/A 07/28/2015   Procedure: OMENTECTOMY ;  Surgeon: Everitt Amber, MD;  Location: [facility];  Service: Gynecology;  Laterality: N/A;  . PARACENTESIS  04/27/15  . PORTACATH PLACEMENT Right 04/2015  . REPLACEMENT TOTAL KNEE     right knee in 2003    Past Medical Hx:  Past Medical History:  Diagnosis Date  . Arthritis   . B12 deficiency   . Cataract   . History of blood transfusion   . History of chemotherapy   . Hypertension   . Iron deficiency anemia   . Mixed hyperlipidemia   . Ovarian cancer ([place])   . 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 Schizophrenia  ([place])   . Ulcerative colitis ([place])   . Vitamin D deficiency     Past Gynecological History:  SVD x 1  No LMP recorded. Patient has had a hysterectomy.  Family Hx:  Family History  Problem Relation Age of Onset  . Prostate cancer Brother   . Cancer Paternal Uncle        1 uncle with cancer NOS  . Lung cancer Maternal Grandmother   . Hypertension Maternal  Grandfather   . Congestive Heart Failure Mother   . Seizures Sister     Review of Systems:  Constitutional  Feels well,    ENT Normal appearing ears and nares bilaterally Skin/Breast  No rash, sores, jaundice, itching, dryness Cardiovascular  No chest pain, shortness of breath, or edema  Pulmonary  No cough or wheeze.  Gastro Intestinal  No nausea, vomitting, or diarrhoea. No bright red blood per rectum, no abdominal pain, change in bowel movement, or constipation.  Genito Urinary  No frequency, urgency, dysuria, no postmenopausal bleeding Musculo Skeletal  No myalgia, arthralgia, joint swelling or pain  Neurologic  No weakness, numbness, change in gait,  Psychology  No depression, anxiety, insomnia.   Vitals:  Blood pressure (!) 152/85, pulse 78, temperature 98.5 F (36.9 C), temperature source Temporal, resp. rate 16, height _0  (1.676 m), weight 167 lb 6.4 oz (75.9 kg), SpO2 98 %.  Physical Exam: WD in NAD Neck  Supple NROM, without any enlargements.  Lymph Node Survey No cervical supraclavicular or inguinal adenopathy Cardiovascular  Pulse normal rate, regularity and rhythm. S1 and S2 normal.  Lungs  Clear to auscultation bilateraly, without wheezes/crackles/rhonchi. Good air movement.  Skin  No rash/lesions/breakdown  Psychiatry  Alert and oriented to person, place, and time  Abdomen  Normoactive bowel sounds, abdomen soft, non distended, non-tender and thin without evidence of hernia.  Incision well healed. No palpable masses. Back No CVA tenderness Genito Urinary  Vaginal cuff normal with no  lesions. In tact vagina.No bleeding. Surgically absent uterus and cervix. Extremities  No bilateral cyanosis, clubbing or edema.   Thereasa Solo, MD   11/19/2019, 5:12 PM

## 2019-11-19 NOTE — Patient Instructions (Signed)
Dr Denman George is recommending follow-up in 6 months.

## 2019-12-16 ENCOUNTER — Other Ambulatory Visit (HOSPITAL_COMMUNITY): Payer: Self-pay | Admitting: *Deleted

## 2019-12-16 DIAGNOSIS — C562 Malignant neoplasm of left ovary: Secondary | ICD-10-CM

## 2019-12-17 ENCOUNTER — Ambulatory Visit (HOSPITAL_COMMUNITY): Payer: Medicare Other | Admitting: Hematology

## 2019-12-17 ENCOUNTER — Other Ambulatory Visit: Payer: Self-pay

## 2019-12-17 ENCOUNTER — Ambulatory Visit (HOSPITAL_COMMUNITY)
Admission: RE | Admit: 2019-12-17 | Discharge: 2019-12-17 | Disposition: A | Discharge status: Home / Self Care | Payer: Medicare Other | Source: Ambulatory Visit | Attending: Hematology | Admitting: Hematology

## 2019-12-17 ENCOUNTER — Inpatient Hospital Stay (HOSPITAL_COMMUNITY): Payer: Medicare Other | Attending: Hematology

## 2019-12-17 DIAGNOSIS — C562 Malignant neoplasm of left ovary: Secondary | ICD-10-CM

## 2019-12-17 DIAGNOSIS — Z9221 Personal history of antineoplastic chemotherapy: Secondary | ICD-10-CM | POA: Diagnosis not present

## 2019-12-17 DIAGNOSIS — Z79899 Other long term (current) drug therapy: Secondary | ICD-10-CM | POA: Insufficient documentation

## 2019-12-17 DIAGNOSIS — Z90710 Acquired absence of both cervix and uterus: Secondary | ICD-10-CM | POA: Diagnosis not present

## 2019-12-17 DIAGNOSIS — Z90722 Acquired absence of ovaries, bilateral: Secondary | ICD-10-CM | POA: Diagnosis not present

## 2019-12-17 DIAGNOSIS — C569 Malignant neoplasm of unspecified ovary: Secondary | ICD-10-CM | POA: Insufficient documentation

## 2019-12-17 DIAGNOSIS — C786 Secondary malignant neoplasm of retroperitoneum and peritoneum: Secondary | ICD-10-CM | POA: Insufficient documentation

## 2019-12-17 LAB — CBC WITH DIFFERENTIAL/PLATELET
Abs Immature Granulocytes: 0.01 10*3/uL (ref 0.00–0.07)
Basophils Absolute: 0.1 10*3/uL (ref 0.0–0.1)
Basophils Relative: 1 %
Eosinophils Absolute: 0.3 10*3/uL (ref 0.0–0.5)
Eosinophils Relative: 5 %
HCT: 33.9 % — ABNORMAL LOW (ref 36.0–46.0)
Hemoglobin: 10.9 g/dL — ABNORMAL LOW (ref 12.0–15.0)
Immature Granulocytes: 0 %
Lymphocytes Relative: 24 %
Lymphs Abs: 1.2 10*3/uL (ref 0.7–4.0)
MCH: 35.5 pg — ABNORMAL HIGH (ref 26.0–34.0)
MCHC: 32.2 g/dL (ref 30.0–36.0)
MCV: 110.4 fL — ABNORMAL HIGH (ref 80.0–100.0)
Monocytes Absolute: 0.8 10*3/uL (ref 0.1–1.0)
Monocytes Relative: 16 %
Neutro Abs: 2.7 10*3/uL (ref 1.7–7.7)
Neutrophils Relative %: 54 %
Platelets: 320 10*3/uL (ref 150–400)
RBC: 3.07 MIL/uL — ABNORMAL LOW (ref 3.87–5.11)
RDW: 13.1 % (ref 11.5–15.5)
WBC: 5.1 10*3/uL (ref 4.0–10.5)
nRBC: 0.4 % — ABNORMAL HIGH (ref 0.0–0.2)

## 2019-12-17 LAB — COMPREHENSIVE METABOLIC PANEL
ALT: 23 U/L (ref 0–44)
AST: 32 U/L (ref 15–41)
Albumin: 4.5 g/dL (ref 3.5–5.0)
Alkaline Phosphatase: 95 U/L (ref 38–126)
Anion gap: 10 (ref 5–15)
BUN: 12 mg/dL (ref 8–23)
CO2: 27 mmol/L (ref 22–32)
Calcium: 9.6 mg/dL (ref 8.9–10.3)
Chloride: 102 mmol/L (ref 98–111)
Creatinine, Ser: 1.19 mg/dL — ABNORMAL HIGH (ref 0.44–1.00)
GFR calc Af Amer: 51 mL/min — ABNORMAL LOW (ref >60)
GFR calc non Af Amer: 44 mL/min — ABNORMAL LOW (ref >60)
Glucose, Bld: 98 mg/dL (ref 70–99)
Potassium: 4.5 mmol/L (ref 3.5–5.1)
Sodium: 139 mmol/L (ref 135–145)
Total Bilirubin: 0.8 mg/dL (ref 0.3–1.2)
Total Protein: 7.5 g/dL (ref 6.5–8.1)

## 2019-12-17 MED ORDER — IOHEXOL 300 MG/ML  SOLN
100.0000 mL | Freq: Once | INTRAMUSCULAR | Status: AC | PRN
  Administered 2019-12-17: 100 mL via INTRAVENOUS

## 2019-12-18 LAB — CA 125: Cancer Antigen (CA) 125: 6.7 U/mL (ref 0.0–38.1)

## 2019-12-24 ENCOUNTER — Inpatient Hospital Stay (HOSPITAL_COMMUNITY): Payer: Medicare Other | Attending: Hematology | Admitting: Hematology

## 2019-12-24 ENCOUNTER — Encounter (HOSPITAL_COMMUNITY): Payer: Self-pay | Admitting: Hematology

## 2019-12-24 ENCOUNTER — Other Ambulatory Visit: Payer: Self-pay

## 2019-12-24 VITALS — BP 144/89 | HR 82 | Temp 96.9°F | Resp 16 | Wt 169.4 lb

## 2019-12-24 DIAGNOSIS — Z90710 Acquired absence of both cervix and uterus: Secondary | ICD-10-CM | POA: Insufficient documentation

## 2019-12-24 DIAGNOSIS — R109 Unspecified abdominal pain: Secondary | ICD-10-CM | POA: Diagnosis not present

## 2019-12-24 DIAGNOSIS — C569 Malignant neoplasm of unspecified ovary: Secondary | ICD-10-CM

## 2019-12-24 DIAGNOSIS — R7989 Other specified abnormal findings of blood chemistry: Secondary | ICD-10-CM | POA: Diagnosis not present

## 2019-12-24 DIAGNOSIS — Z79899 Other long term (current) drug therapy: Secondary | ICD-10-CM | POA: Insufficient documentation

## 2019-12-24 DIAGNOSIS — Z8543 Personal history of malignant neoplasm of ovary: Secondary | ICD-10-CM | POA: Diagnosis not present

## 2019-12-24 DIAGNOSIS — M549 Dorsalgia, unspecified: Secondary | ICD-10-CM | POA: Diagnosis not present

## 2019-12-24 DIAGNOSIS — I10 Essential (primary) hypertension: Secondary | ICD-10-CM | POA: Insufficient documentation

## 2019-12-24 MED ORDER — VITAMIN B-12 1000 MCG PO TABS: 1000.0000 ug | ORAL_TABLET | Freq: Every day | ORAL | 12 refills | Status: DC

## 2019-12-24 NOTE — Patient Instructions (Addendum)
[place] at [facility] Discharge Instructions  You were seen today by Dr. Delton Coombes. He went over your recent lab results. Your CT scan results were favorable.  Continue taking your cancer medication as prescribed.  He will see you back in 3 months for labs and follow up.   Thank you for choosing [place] at [facility] to provide your oncology and hematology care.  To afford each patient quality time with our provider, please arrive at least 15 minutes before your scheduled appointment time.   If you have a lab appointment with the [facility] please come in thru the  Main Entrance and check in at the main information desk  You need to re-schedule your appointment should you arrive 10 or more minutes late.  We strive to give you quality time with our providers, and arriving late affects you and other patients whose appointments are after yours.  Also, if you no show three or more times for appointments you may be dismissed from the clinic at the providers discretion.     Again, thank you for choosing [facility].  Our hope is that these requests will decrease the amount of time that you wait before being seen by our physicians.       _____________________________________________________________  Should you have questions after your visit to [facility], please contact our office at [phone] between the hours of 8:00 a.m. and 4:30 p.m.  Voicemails left after 4:00 p.m. will not be returned until the following business day.  For prescription refill requests, have your pharmacy contact our office and allow 72 hours.    Cancer Center Support Programs:   > Cancer Support Group  2nd Tuesday of the month 1pm-2pm, Journey Room

## 2019-12-24 NOTE — Assessment & Plan Note (Addendum)
1.  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 -Status post 3 cycles of carboplatin and paclitaxel followed by debulking surgery by Dr. Denman George on 08/07/2015 followed by 3 more cycles completed on 10/07/2015. -Status post 6 cycles of carboplatin and paclitaxel from 08/17/2017-11/30/2017 and the second line setting. -Germline mutation testing was negative. -Niraparib 300 mg daily maintenance started in April 2019. -We reviewed results of the CT scan of the abdomen and pelvis from 12/17/2019 which showed no evidence of local recurrence or metastatic disease.  Pelvic floor laxity with cystocele.  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 -We reviewed her CA-125 which was normal.  We also reviewed her CBC which showed mild anemia likely from bone marrow suppression. -She also met with Dr. Denman George recently.  He was recommended to continue niraparib indefinitely until progression as she is tolerating it very well. -I have discussed the recommendation with the patient.  We also discussed the rare chance of MDS/AML with continued therapy.  She is agreeable to continue therapy at this time. -We will reevaluate her in 3 months with repeat labs.  2.  Elevated creatinine: -She has mildly elevated creatinine of 1.17. -This is from niraparib.  We will closely monitor it.  3.  Back pain and abdominal pain: -She is continuing tramadol 50 mg as needed which is helping.  4.  Hypertension: -Blood pressure today is 144/89. -She will continue lisinopril and metoprolol.

## 2019-12-24 NOTE — Progress Notes (Signed)
[facility] [place], [place] [address]   CLINIC:  Medical Oncology/Hematology  PCP:  Patient, No Pcp Per No address on file None   REASON FOR VISIT:  Follow-up for ovarian cancer   BRIEF ONCOLOGIC HISTORY:  Oncology History  Ovarian cancer ([place])  04/25/2015 - 04/28/2015 Hospital Admission   Nausea/diarrhea.  Oncology consult completed on 04/27/2015.   04/25/2015 Tumor Marker   CA 125- 3902.      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   04/27/2015 Procedure   US Paracentesis- A total of approximately 3700 mL of amber colored fluid was removed. A fluid sample was sent for laboratory analysis.   04/27/2015 Imaging   CT Chest- Mild left supraclavicular lymphadenopathy and moderate mediastinal lymphadenopathy involving the prevascular, subcarinal and bilateral pericardiophrenic nodal chains, likely metastatic.   04/27/2015 Pathology Results   Diagnosis PERITONEAL/ASCITIC FLUID(SPECIMEN 1 OF 1 COLLECTED 04/27/15): MALIGNANT CELLS CONSISTENT WITH METASTATIC ADENOCARCINOMA.  The immunophenotype is most consistent with a gynecologic primary, most likely ovary.   05/08/2015 Miscellaneous   Seen by Dr. Denman George- recommending Carboplatin/Paclitaxel x 3 cycles and return visit to see her (within 1 week of administration of third cycle) to evaluate for optimal sequencing of treatment modalities.   05/13/2015 - 05/15/2015 Hospital Admission   Hospitalized for AKI   05/19/2015 - 07/01/2015 Chemotherapy   Carboplatin/Paclitaxel x 3 cycles   07/28/2015 Surgery   Exploratory laparotomy with total abdominal hysterectomy, bilateral salpingo-oophorectomy, omentectomy radical tumor debulking for ovarian cancer; by Dr. Everitt Amber.   07/28/2015 Pathology Results   Uterus +/- tubes/ovaries, neoplastic, cervix - HIGH GRADE SEROUS  CARCINOMA INVOLVING LEFT OVARY, LEFT FALLOPIAN TUBE AND RIGHT FALLOPIAN TUBE. - CERVIX, ENDOMETRIUM AND MYOMETRIUM ARE FREE OF TUMOR. 2. Cul-de-sac biop...   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  The Breast/Ovarian gene panel offered by [facility] includes sequencing and rearrangement analysis for the following 20 genes:  ATM, BARD1, BRCA1, BRCA2, BRIP1, CDH1, CHEK2, EPCAM, FANCC, MLH1, MSH2, MSH6, NBN, PALB2, PMS2, PTEN, RAD51C, RAD51D, TP53, and XRCC2.   The report date is 12/14/2015.  Update:  MSH6 c.2667G>T VUS has been reclassified from a variant of uncertain significance to a likely benign variant based on a combination of sources, e.g., internal data, published literature, population databases and in [place].  The updated report date is October 29, 2019.   12/23/2015 Miscellaneous   Genetic counseling.  Genetic testing was normal, and did not reveal a deleterious mutation in these genes   03/07/2017 Imaging   CT C/A/P IMPRESSION: Chest Impression:  No evidence of metastatic disease in thorax.  Abdomen / Pelvis Impression:  1. Interval increase in size of solitary nodular peritoneal implant in the LEFT upper quadrant . 2. No additional evidence of peritoneal nodularity. 3. No ascites.   07/31/2017 Progression   CT C/A/P: IMPRESSION: 1. Left upper quadrant nodule continues to progress, now measuring 20 x 26 mm. 2. New nodule identified between the in adrenal gland, concerning for metastatic deposit. 3. Interval development of tiny nodules in the left lower quadrant mesenteric, concerning for metastases. 4. No ascites. 5. Status post total abdominal hysterectomy and omentectomy.      CANCER STAGING: Cancer Staging Ovarian cancer [facility]) Staging form: Ovary, AJCC 7th  Edition - Clinical stage from 04/27/2015: FIGO Stage IVB, calculated as Stage IV (T3c, N1,  M1) - Signed by Baird Cancer, PA-C on 09/16/2015 - Pathologic stage from 05/08/2015: FIGO Stage IVB, calculated as Stage IV (TX, N1, M1) - Signed by Everitt Amber, MD on 05/08/2015 - Pathologic stage from 07/28/2015: FIGO Stage IV (T3c, NX, M1) - Signed by Baird Cancer, PA-C on 09/16/2015    INTERVAL HISTORY:  Ms. Trevino 79 y.o. female seen for follow-up of recurrent ovarian cancer.  Appetite is 100%.  Energy levels are 75%.  Reports that the pain is well controlled with tramadol.  She had CT scans done recently.  Denies any nausea, vomiting, diarrhea or constipation.   REVIEW OF SYSTEMS:  Review of Systems  All other systems reviewed and are negative.    PAST MEDICAL/SURGICAL HISTORY:  Past Medical History:  Diagnosis Date  . Arthritis   . B12 deficiency   . Cataract   . History of blood transfusion   . History of chemotherapy   . Hypertension   . Iron deficiency anemia   . Mixed hyperlipidemia   . Ovarian cancer ([place])   . 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 Schizophrenia ([place])   . Ulcerative colitis ([place])   . Vitamin D deficiency    Past Surgical History:  Procedure Laterality Date  . ABDOMINAL HYSTERECTOMY N/A 07/28/2015   Procedure: TOTAL HYSTERECTOMY ABDOMINAL BILATERAL SALPINGO OOPHRORECTOMY;  Surgeon: Everitt Amber, MD;  Location: [facility]S;  Service: Gynecology;  Laterality: N/A;  . DEBULKING N/A 07/28/2015   Procedure: DEBULKING;  Surgeon: Everitt Amber, MD;  Location: [facility];  Service: Gynecology;  Laterality: N/A;  . LAPAROTOMY N/A 07/28/2015   Procedure: EXPLORATORY LAPAROTOMY;  Surgeon: Everitt Amber, MD;  Location: [facility];  Service: Gynecology;  Laterality: N/A;  . OMENTECTOMY N/A 07/28/2015   Procedure: OMENTECTOMY ;  Surgeon: Everitt Amber, MD;  Location: [facility];  Service: Gynecology;  Laterality: N/A;  . PARACENTESIS  04/27/15  . PORTACATH PLACEMENT Right 04/2015  . REPLACEMENT TOTAL KNEE     right knee in 2003     SOCIAL HISTORY:  Social History   Socioeconomic  History  . Marital status: Single    Spouse name: Not on file  . Number of children: 1  . Years of education: Not on file  . Highest education level: Not on file  Occupational History  . Not on file  Tobacco Use  . Smoking status: Never Smoker  . Smokeless tobacco: Never Used  Substance and Sexual Activity  . Alcohol use: No  . Drug use: No  . Sexual activity: Not Currently  Other Topics Concern  . Not on file  Social History Narrative  . Not on file   Social Determinants of Health   Financial Resource Strain:   . Difficulty of Paying Living Expenses:   Food Insecurity:   . Worried About Charity fundraiser in the Last Year:   . Arboriculturist in the Last Year:   Transportation Needs:   . Film/video editor (Medical):   Marland Kitchen Lack of Transportation (Non-Medical):   Physical Activity:   . Days of Exercise per Week:   . Minutes of Exercise per Session:   Stress:   . Feeling of Stress :   Social Connections:   . Frequency of Communication with Friends and Family:   . Frequency of Social Gatherings with Friends and Family:   . Attends Religious Services:   . Active Member  of Clubs or Organizations:   . Attends Archivist Meetings:   Marland Kitchen Marital Status:   Intimate Partner Violence:   . Fear of Current or Ex-Partner:   . Emotionally Abused:   Marland Kitchen Physically Abused:   . Sexually Abused:     FAMILY HISTORY:  Family History  Problem Relation Age of Onset  . Prostate cancer Brother   . Cancer Paternal Uncle        1 uncle with cancer NOS  . Lung cancer Maternal Grandmother   . Hypertension Maternal Grandfather   . Congestive Heart Failure Mother   . Seizures Sister     CURRENT MEDICATIONS:  Outpatient Encounter Medications as of 12/24/2019  Medication Sig  . acetaminophen (TYLENOL) 500 MG tablet Take 2 tablets (1,000 mg total) by mouth every 12 (twelve) hours.  Marland Kitchen amLODipine (NORVASC) 10 MG tablet Take 10 mg by mouth every evening.   Marland Kitchen aspirin EC 81 MG  tablet Take 81 mg by mouth daily.  . cholecalciferol (VITAMIN D) 25 MCG (1000 UNIT) tablet Take 1,000 Units by mouth daily.  . Cholecalciferol (VITAMIN D-3) 1000 UNITS CAPS Take 1,000 Units by mouth daily.   Marland Kitchen docusate sodium (COLACE) 100 MG capsule Take 100 mg by mouth 2 (two) times daily.  . folic acid (FOLVITE) 1 MG tablet Take 1 mg by mouth daily.  Marland Kitchen lidocaine-prilocaine (EMLA) cream Apply to affected area once  . lisinopril (PRINIVIL,ZESTRIL) 40 MG tablet Take 40 mg by mouth daily.   Marland Kitchen lovastatin (MEVACOR) 10 MG tablet Take 10 mg by mouth at bedtime.  . metoprolol tartrate (LOPRESSOR) 25 MG tablet Take 1 tablet (25 mg total) by mouth 2 (two) times daily.  . mirtazapine (REMERON) 15 MG tablet Take 7.5 mg by mouth at bedtime.  . Multiple Vitamin (MULTIVITAMIN WITH MINERALS) TABS tablet Take 1 tablet by mouth daily.  . ondansetron (ZOFRAN) 8 MG tablet Take 1 tablet (8 mg total) by mouth 2 (two) times daily as needed for refractory nausea / vomiting. Start on day 3 after chemo.  . prochlorperazine (COMPAZINE) 10 MG tablet Take 1 tablet (10 mg total) by mouth every 6 (six) hours as needed (Nausea or vomiting).  . QUEtiapine (SEROQUEL XR) 300 MG 24 hr tablet Take 300 mg by mouth at bedtime.  . sulfaSALAzine (AZULFIDINE) 500 MG tablet Take 500 mg by mouth 3 (three) times daily.  . traMADol (ULTRAM) 50 MG tablet TAKE (1) TABLET BY MOUTH EVERY SIX HOURS AS NEEDED.  Marland Kitchen ZEJULA 100 MG CAPS TAKE 3 CAPSULES (300MG) BY MOUTH DAILY.  . vitamin B-12 (CYANOCOBALAMIN) 1000 MCG tablet Take 1 tablet (1,000 mcg total) by mouth daily.   No facility-administered encounter medications on file as of 12/24/2019.    ALLERGIES:  No Known Allergies   PHYSICAL EXAM:  ECOG Performance status: 1  Vitals:   12/24/19 1000  BP: (!) 144/89  Pulse: 82  Resp: 16  Temp: (!) 96.9 F (36.1 C)  SpO2: 98%   Filed Weights   12/24/19 1000  Weight: 169 lb 7 oz (76.9 kg)    Physical Exam Vitals reviewed.    Constitutional:      Appearance: Normal appearance.  Cardiovascular:     Rate and Rhythm: Normal rate and regular rhythm.     Heart sounds: Normal heart sounds.  Pulmonary:     Effort: Pulmonary effort is normal.     Breath sounds: Normal breath sounds.  Abdominal:     General: There is no distension.  Palpations: Abdomen is soft. There is no mass.  Musculoskeletal:        General: No swelling.  Skin:    General: Skin is warm.  Neurological:     General: No focal deficit present.     Mental Status: She is alert.  Psychiatric:        Mood and Affect: Mood normal.        Behavior: Behavior normal.      LABORATORY DATA:  I have reviewed the labs as listed.  CBC    Component Value Date/Time   WBC 5.1 12/17/2019 0902   RBC 3.07 (L) 12/17/2019 0902   HGB 10.9 (L) 12/17/2019 0902   HCT 33.9 (L) 12/17/2019 0902   PLT 320 12/17/2019 0902   MCV 110.4 (H) 12/17/2019 0902   MCH 35.5 (H) 12/17/2019 0902   MCHC 32.2 12/17/2019 0902   RDW 13.1 12/17/2019 0902   LYMPHSABS 1.2 12/17/2019 0902   MONOABS 0.8 12/17/2019 0902   EOSABS 0.3 12/17/2019 0902   BASOSABS 0.1 12/17/2019 0902   CMP Latest Ref Rng & Units 12/17/2019 10/15/2019 07/12/2019  Glucose 70 - 99 mg/dL 98 96 97  BUN 8 - 23 mg/dL 12 15 12   Creatinine 0.44 - 1.00 mg/dL 1.19(H) 1.17(H) 1.18(H)  Sodium 135 - 145 mmol/L 139 138 139  Potassium 3.5 - 5.1 mmol/L 4.5 4.5 4.2  Chloride 98 - 111 mmol/L 102 103 103  CO2 22 - 32 mmol/L 27 28 27   Calcium 8.9 - 10.3 mg/dL 9.6 9.5 9.9  Total Protein 6.5 - 8.1 g/dL 7.5 7.4 7.6  Total Bilirubin 0.3 - 1.2 mg/dL 0.8 0.5 0.7  Alkaline Phos 38 - 126 U/L 95 92 88  AST 15 - 41 U/L 32 35 30  ALT 0 - 44 U/L 23 28 22        DIAGNOSTIC IMAGING:  I have reviewed the scans with the patient.    ASSESSMENT & PLAN:   Ovarian cancer ([place]) 1.  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 -Status post 3 cycles of carboplatin and paclitaxel  followed by debulking surgery by Dr. Denman George on 08/07/2015 followed by 3 more cycles completed on 10/07/2015. -Status post 6 cycles of carboplatin and paclitaxel from 08/17/2017-11/30/2017 and the second line setting. -Germline mutation testing was negative. -Niraparib 300 mg daily maintenance started in April 2019. -We reviewed results of the CT scan of the abdomen and pelvis from 12/17/2019 which showed no evidence of local recurrence or metastatic disease.  Pelvic floor laxity with cystocele.  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 -We reviewed her CA-125 which was normal.  We also reviewed her CBC which showed mild anemia likely from bone marrow suppression. -She also met with Dr. Denman George recently.  He was recommended to continue niraparib indefinitely until progression as she is tolerating it very well. -I have discussed the recommendation with the patient.  We also discussed the rare chance of MDS/AML with continued therapy.  She is agreeable to continue therapy at this time. -We will reevaluate her in 3 months with repeat labs.  2.  Elevated creatinine: -She has mildly elevated creatinine of 1.17. -This is from niraparib.  We will closely monitor it.  3.  Back pain and abdominal pain: -She is continuing tramadol 50 mg as needed which is helping.  4.  Hypertension: -Blood pressure today is 144/89. -She will continue lisinopril and metoprolol.    Orders placed this encounter:  Orders Placed This Encounter  Procedures  . CBC with Differential/Platelet  . Comprehensive metabolic panel  . Lactate dehydrogenase  . Magnesium  . CA 125   Total time spent is 30 minutes with more than 60% of the time spent face-to-face discussing scan results, lab results, treatment recommendations, side effects, counseling and coordination of care.   Derek Jack, MD [place] [phone]

## 2020-01-28 ENCOUNTER — Other Ambulatory Visit (HOSPITAL_COMMUNITY): Payer: Self-pay | Admitting: Hematology

## 2020-01-28 DIAGNOSIS — C562 Malignant neoplasm of left ovary: Secondary | ICD-10-CM

## 2020-02-25 ENCOUNTER — Other Ambulatory Visit (HOSPITAL_COMMUNITY): Payer: Self-pay | Admitting: *Deleted

## 2020-02-25 DIAGNOSIS — C562 Malignant neoplasm of left ovary: Secondary | ICD-10-CM

## 2020-02-25 MED ORDER — TRAMADOL HCL 50 MG PO TABS: ORAL_TABLET | ORAL | 0 refills | Status: DC

## 2020-03-24 ENCOUNTER — Other Ambulatory Visit: Payer: Self-pay

## 2020-03-24 ENCOUNTER — Inpatient Hospital Stay (HOSPITAL_COMMUNITY): Payer: Medicare Other | Attending: Hematology

## 2020-03-24 DIAGNOSIS — R109 Unspecified abdominal pain: Secondary | ICD-10-CM | POA: Insufficient documentation

## 2020-03-24 DIAGNOSIS — M549 Dorsalgia, unspecified: Secondary | ICD-10-CM | POA: Insufficient documentation

## 2020-03-24 DIAGNOSIS — I10 Essential (primary) hypertension: Secondary | ICD-10-CM | POA: Diagnosis not present

## 2020-03-24 DIAGNOSIS — R7989 Other specified abnormal findings of blood chemistry: Secondary | ICD-10-CM | POA: Insufficient documentation

## 2020-03-24 DIAGNOSIS — C569 Malignant neoplasm of unspecified ovary: Secondary | ICD-10-CM

## 2020-03-24 DIAGNOSIS — Z79899 Other long term (current) drug therapy: Secondary | ICD-10-CM | POA: Insufficient documentation

## 2020-03-24 DIAGNOSIS — Z8543 Personal history of malignant neoplasm of ovary: Secondary | ICD-10-CM | POA: Insufficient documentation

## 2020-03-24 DIAGNOSIS — Z90710 Acquired absence of both cervix and uterus: Secondary | ICD-10-CM | POA: Diagnosis not present

## 2020-03-24 LAB — CBC WITH DIFFERENTIAL/PLATELET
Abs Immature Granulocytes: 0.01 10*3/uL (ref 0.00–0.07)
Basophils Absolute: 0.1 10*3/uL (ref 0.0–0.1)
Basophils Relative: 1 %
Eosinophils Absolute: 0.4 10*3/uL (ref 0.0–0.5)
Eosinophils Relative: 8 %
HCT: 34.2 % — ABNORMAL LOW (ref 36.0–46.0)
Hemoglobin: 11.1 g/dL — ABNORMAL LOW (ref 12.0–15.0)
Immature Granulocytes: 0 %
Lymphocytes Relative: 25 %
Lymphs Abs: 1.3 10*3/uL (ref 0.7–4.0)
MCH: 35.8 pg — ABNORMAL HIGH (ref 26.0–34.0)
MCHC: 32.5 g/dL (ref 30.0–36.0)
MCV: 110.3 fL — ABNORMAL HIGH (ref 80.0–100.0)
Monocytes Absolute: 0.8 10*3/uL (ref 0.1–1.0)
Monocytes Relative: 17 %
Neutro Abs: 2.5 10*3/uL (ref 1.7–7.7)
Neutrophils Relative %: 49 %
Platelets: 299 10*3/uL (ref 150–400)
RBC: 3.1 MIL/uL — ABNORMAL LOW (ref 3.87–5.11)
RDW: 13.2 % (ref 11.5–15.5)
WBC: 5 10*3/uL (ref 4.0–10.5)
nRBC: 0.4 % — ABNORMAL HIGH (ref 0.0–0.2)

## 2020-03-24 LAB — COMPREHENSIVE METABOLIC PANEL
ALT: 18 U/L (ref 0–44)
AST: 31 U/L (ref 15–41)
Albumin: 4.5 g/dL (ref 3.5–5.0)
Alkaline Phosphatase: 91 U/L (ref 38–126)
Anion gap: 9 (ref 5–15)
BUN: 16 mg/dL (ref 8–23)
CO2: 29 mmol/L (ref 22–32)
Calcium: 9.7 mg/dL (ref 8.9–10.3)
Chloride: 102 mmol/L (ref 98–111)
Creatinine, Ser: 1.25 mg/dL — ABNORMAL HIGH (ref 0.44–1.00)
GFR calc Af Amer: 48 mL/min — ABNORMAL LOW (ref >60)
GFR calc non Af Amer: 42 mL/min — ABNORMAL LOW (ref >60)
Glucose, Bld: 93 mg/dL (ref 70–99)
Potassium: 4.1 mmol/L (ref 3.5–5.1)
Sodium: 140 mmol/L (ref 135–145)
Total Bilirubin: 0.5 mg/dL (ref 0.3–1.2)
Total Protein: 7.6 g/dL (ref 6.5–8.1)

## 2020-03-24 LAB — LACTATE DEHYDROGENASE: LDH: 219 U/L — ABNORMAL HIGH (ref 98–192)

## 2020-03-24 LAB — MAGNESIUM: Magnesium: 2.1 mg/dL (ref 1.7–2.4)

## 2020-03-25 LAB — CA 125: Cancer Antigen (CA) 125: 6.2 U/mL (ref 0.0–38.1)

## 2020-03-31 ENCOUNTER — Inpatient Hospital Stay (HOSPITAL_BASED_OUTPATIENT_CLINIC_OR_DEPARTMENT_OTHER): Payer: Medicare Other | Admitting: Hematology

## 2020-03-31 ENCOUNTER — Other Ambulatory Visit: Payer: Self-pay

## 2020-03-31 VITALS — BP 169/77 | HR 76 | Temp 97.5°F | Resp 18 | Wt 167.8 lb

## 2020-03-31 DIAGNOSIS — C569 Malignant neoplasm of unspecified ovary: Secondary | ICD-10-CM

## 2020-03-31 DIAGNOSIS — C562 Malignant neoplasm of left ovary: Secondary | ICD-10-CM

## 2020-03-31 DIAGNOSIS — Z8543 Personal history of malignant neoplasm of ovary: Secondary | ICD-10-CM

## 2020-03-31 NOTE — Patient Instructions (Signed)
[place] at [facility] Discharge Instructions  You were seen today by Dr. Delton Coombes. He went over your recent results. Please start checking your blood pressure at home every morning. You will be scheduled for a CT scan of your abdomen and pelvis. Dr. Delton Coombes will see you back in 3 months for labs and follow up.   Thank you for choosing [place] at [facility] to provide your oncology and hematology care.  To afford each patient quality time with our provider, please arrive at least 15 minutes before your scheduled appointment time.   If you have a lab appointment with the [facility] please come in thru the Main Entrance and check in at the main information desk  You need to re-schedule your appointment should you arrive 10 or more minutes late.  We strive to give you quality time with our providers, and arriving late affects you and other patients whose appointments are after yours.  Also, if you no show three or more times for appointments you may be dismissed from the clinic at the providers discretion.     Again, thank you for choosing [facility].  Our hope is that these requests will decrease the amount of time that you wait before being seen by our physicians.       _____________________________________________________________  Should you have questions after your visit to [facility], please contact our office at [phone] between the hours of 8:00 a.m. and 4:30 p.m.  Voicemails left after 4:00 p.m. will not be returned until the following business day.  For prescription refill requests, have your pharmacy contact our office and allow 72 hours.    Cancer Center Support Programs:   > Cancer Support Group  2nd Tuesday of the month 1pm-2pm, Journey Room

## 2020-03-31 NOTE — Progress Notes (Signed)
Latasha [name], Latasha [name]   CLINIC:  Medical Oncology/Hematology  PCP:  Patient, No Pcp Per None None   REASON FOR VISIT:  Follow-up for ovarian cancer  PRIOR THERAPY:  1. Carboplatin and paclitaxel x 3 cycles from 05/19/2015 to 07/01/2015, then 3 more cycles through 10/07/2015. Afterwards, x 6 cycles from 08/17/2017 to 11/30/2017. 2. Debulking surgery with TAH-BSO on 08/07/2015.  CURRENT THERAPY: Surveillance  BRIEF ONCOLOGIC HISTORY:  Oncology History  Ovarian cancer ([place])  04/25/2015 - 04/28/2015 Hospital Admission   Nausea/diarrhea.  Oncology consult completed on 04/27/2015.   04/25/2015 Tumor Marker   CA 125- 3902.      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   04/27/2015 Procedure   US Paracentesis- A total of approximately 3700 mL of amber colored fluid was removed. A fluid sample was sent for laboratory analysis.   04/27/2015 Imaging   CT Chest- Mild left supraclavicular lymphadenopathy and moderate mediastinal lymphadenopathy involving the prevascular, subcarinal and bilateral pericardiophrenic nodal chains, likely metastatic.   04/27/2015 Pathology Results   Diagnosis PERITONEAL/ASCITIC FLUID(SPECIMEN 1 OF 1 COLLECTED 04/27/15): MALIGNANT CELLS CONSISTENT WITH METASTATIC ADENOCARCINOMA.  The immunophenotype is most consistent with a gynecologic primary, most likely ovary.   05/08/2015 Miscellaneous   Seen by Dr. Denman George- recommending Carboplatin/Paclitaxel x 3 cycles and return visit to see her (within 1 week of administration of third cycle) to evaluate for optimal sequencing of treatment modalities.   05/13/2015 - 05/15/2015 Hospital Admission   Hospitalized for AKI   05/19/2015 - 07/01/2015 Chemotherapy   Carboplatin/Paclitaxel x 3 cycles   07/28/2015 Surgery   Exploratory  laparotomy with total abdominal hysterectomy, bilateral salpingo-oophorectomy, omentectomy radical tumor debulking for ovarian cancer; by Dr. Everitt Amber.   07/28/2015 Pathology Results   Uterus +/- tubes/ovaries, neoplastic, cervix - HIGH GRADE SEROUS CARCINOMA INVOLVING LEFT OVARY, LEFT FALLOPIAN TUBE AND RIGHT FALLOPIAN TUBE. - CERVIX, ENDOMETRIUM AND MYOMETRIUM ARE FREE OF TUMOR. 2. Cul-de-sac biop...   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  The Breast/Ovarian gene panel offered by [facility] includes sequencing and rearrangement analysis for the following 20 genes:  ATM, BARD1, BRCA1, BRCA2, BRIP1, CDH1, CHEK2, EPCAM, FANCC, MLH1, MSH2, MSH6, NBN, PALB2, PMS2, PTEN, RAD51C, RAD51D, TP53, and XRCC2.   The report date is 12/14/2015.  Update:  MSH6 c.2667G>T VUS has been reclassified from a variant of uncertain significance to a likely benign variant based on a combination of sources, e.g., internal data, published literature, population databases and in [place].  The updated report date is October 29, 2019.   12/23/2015 Miscellaneous   Genetic counseling.  Genetic testing was normal, and did not reveal a deleterious mutation in these genes   03/07/2017 Imaging   CT C/A/P IMPRESSION: Chest Impression:  No evidence of metastatic disease in thorax.  Abdomen / Pelvis Impression:  1. Interval increase in size of solitary nodular peritoneal implant in the LEFT upper quadrant . 2. No additional evidence of peritoneal nodularity. 3. No ascites.   07/31/2017 Progression   CT C/A/P: IMPRESSION: 1. Left upper quadrant nodule continues to progress, now measuring 20 x 26 mm. 2. New nodule identified between the in adrenal gland, concerning for metastatic deposit. 3. Interval development of tiny nodules in  the left lower quadrant mesenteric, concerning for metastases. 4. No  ascites. 5. Status post total abdominal hysterectomy and omentectomy.     CANCER STAGING: Cancer Staging Ovarian cancer [facility]) Staging form: Ovary, AJCC 7th Edition - Clinical stage from 04/27/2015: FIGO Stage IVB, calculated as Stage IV (T3c, N1, M1) - Signed by Baird Cancer, PA-C on 09/16/2015 - Pathologic stage from 05/08/2015: FIGO Stage IVB, calculated as Stage IV (TX, N1, M1) - Signed by Everitt Amber, MD on 05/08/2015 - Pathologic stage from 07/28/2015: FIGO Stage IV (T3c, NX, M1) - Signed by Baird Cancer, PA-C on 09/16/2015   INTERVAL HISTORY:  Latasha [name], a 79 y.o. female, returns for routine follow-up of her ovarian cancer. Latasha [name] was last seen on 12/24/2019.  Today she is accompanied by her husband. She reports feeling well and denies any issues since the last visit; denies any abdominal pain, N/V/D, F/C or infections over the past 6 months. She is tolerating her Zejula well and does not take her tramadol daily.  She has a blood pressure machine at home, but she does not check her BP daily.    REVIEW OF SYSTEMS:  Review of Systems  Constitutional: Positive for fatigue. Negative for appetite change, chills and fever.  Cardiovascular: Negative for leg swelling.  Gastrointestinal: Negative for abdominal pain, diarrhea and nausea.  Psychiatric/Behavioral: Positive for depression. The patient is nervous/anxious.   All other systems reviewed and are negative.   PAST MEDICAL/SURGICAL HISTORY:  Past Medical History:  Diagnosis Date  . Arthritis   . B12 deficiency   . Cataract   . History of blood transfusion   . History of chemotherapy   . Hypertension   . Iron deficiency anemia   . Mixed hyperlipidemia   . Ovarian cancer ([place])   . 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 Schizophrenia ([place])   . Ulcerative colitis ([place])   . Vitamin D deficiency    Past Surgical History:  Procedure Laterality Date  . ABDOMINAL HYSTERECTOMY N/A 07/28/2015   Procedure: TOTAL  HYSTERECTOMY ABDOMINAL BILATERAL SALPINGO OOPHRORECTOMY;  Surgeon: Everitt Amber, MD;  Location: [facility];  Service: Gynecology;  Laterality: N/A;  . DEBULKING N/A 07/28/2015   Procedure: DEBULKING;  Surgeon: Everitt Amber, MD;  Location: [facility];  Service: Gynecology;  Laterality: N/A;  . LAPAROTOMY N/A 07/28/2015   Procedure: EXPLORATORY LAPAROTOMY;  Surgeon: Everitt Amber, MD;  Location: [facility];  Service: Gynecology;  Laterality: N/A;  . OMENTECTOMY N/A 07/28/2015   Procedure: OMENTECTOMY ;  Surgeon: Everitt Amber, MD;  Location: [facility];  Service: Gynecology;  Laterality: N/A;  . PARACENTESIS  04/27/15  . PORTACATH PLACEMENT Right 04/2015  . REPLACEMENT TOTAL KNEE     right knee in 2003    SOCIAL HISTORY:  Social History   Socioeconomic History  . Marital status: Single    Spouse name: Not on file  . Number of children: 1  . Years of education: Not on file  . Highest education level: Not on file  Occupational History  . Not on file  Tobacco Use  . Smoking status: Never Smoker  . Smokeless tobacco: Never Used  Vaping Use  . Vaping Use: Never used  Substance and Sexual Activity  . Alcohol use: No  . Drug use: No  . Sexual activity: Not Currently  Other Topics Concern  . Not on file  Social History Narrative  . Not on file   Social Determinants of Health   Financial Resource Strain:   .  Difficulty of Paying Living Expenses:   Food Insecurity:   . Worried About Charity fundraiser in the Last Year:   . Arboriculturist in the Last Year:   Transportation Needs:   . Film/video editor (Medical):   Marland Kitchen Lack of Transportation (Non-Medical):   Physical Activity:   . Days of Exercise per Week:   . Minutes of Exercise per Session:   Stress:   . Feeling of Stress :   Social Connections:   . Frequency of Communication with Friends and Family:   . Frequency of Social Gatherings with Friends and Family:   . Attends Religious Services:   . Active Member of Clubs or Organizations:   . Attends  Archivist Meetings:   Marland Kitchen Marital Status:   Intimate Partner Violence:   . Fear of Current or Ex-Partner:   . Emotionally Abused:   Marland Kitchen Physically Abused:   . Sexually Abused:     FAMILY HISTORY:  Family History  Problem Relation Age of Onset  . Prostate cancer Brother   . Cancer Paternal Uncle        1 uncle with cancer NOS  . Lung cancer Maternal Grandmother   . Hypertension Maternal Grandfather   . Congestive Heart Failure Mother   . Seizures Sister     CURRENT MEDICATIONS:  Current Outpatient Medications  Medication Sig Dispense Refill  . amLODipine (NORVASC) 10 MG tablet Take 10 mg by mouth every evening.     Marland Kitchen aspirin EC 81 MG tablet Take 81 mg by mouth daily.    . cholecalciferol (VITAMIN D) 25 MCG (1000 UNIT) tablet Take 1,000 Units by mouth daily.    . Cholecalciferol (VITAMIN D-3) 1000 UNITS CAPS Take 1,000 Units by mouth daily.     Marland Kitchen docusate sodium (COLACE) 100 MG capsule Take 100 mg by mouth 2 (two) times daily.    . folic acid (FOLVITE) 1 MG tablet Take 1 mg by mouth daily.    Marland Kitchen lisinopril (PRINIVIL,ZESTRIL) 40 MG tablet Take 40 mg by mouth daily.   2  . lovastatin (MEVACOR) 10 MG tablet Take 10 mg by mouth at bedtime.    . metoprolol tartrate (LOPRESSOR) 25 MG tablet Take 1 tablet (25 mg total) by mouth 2 (two) times daily. 60 tablet 6  . mirtazapine (REMERON) 15 MG tablet Take 7.5 mg by mouth at bedtime.    . Multiple Vitamin (MULTIVITAMIN WITH MINERALS) TABS tablet Take 1 tablet by mouth daily.    . QUEtiapine (SEROQUEL XR) 300 MG 24 hr tablet Take 300 mg by mouth at bedtime.    . sulfaSALAzine (AZULFIDINE) 500 MG tablet Take 500 mg by mouth 3 (three) times daily.    . traMADol (ULTRAM) 50 MG tablet TAKE (1) TABLET BY MOUTH EVERY SIX HOURS AS NEEDED. 90 tablet 0  . vitamin B-12 (CYANOCOBALAMIN) 1000 MCG tablet Take 1 tablet (1,000 mcg total) by mouth daily. 30 tablet 12  . ZEJULA 100 MG capsule TAKE 3 CAPSULES (300 MG) BY MOUTH DAILY. 90 capsule 3  .  acetaminophen (TYLENOL) 500 MG tablet Take 2 tablets (1,000 mg total) by mouth every 12 (twelve) hours. (Patient not taking: Reported on 03/31/2020) 30 tablet 0  . lidocaine-prilocaine (EMLA) cream Apply to affected area once (Patient not taking: Reported on 03/31/2020) 30 g 3  . ondansetron (ZOFRAN) 8 MG tablet Take 1 tablet (8 mg total) by mouth 2 (two) times daily as needed for refractory nausea / vomiting. Start on  day 3 after chemo. (Patient not taking: Reported on 03/31/2020) 30 tablet 1  . prochlorperazine (COMPAZINE) 10 MG tablet Take 1 tablet (10 mg total) by mouth every 6 (six) hours as needed (Nausea or vomiting). (Patient not taking: Reported on 03/31/2020) 60 tablet 3   No current facility-administered medications for this visit.    ALLERGIES:  No Known Allergies  PHYSICAL EXAM:  Performance status (ECOG): 1 - Symptomatic but completely ambulatory  Vitals:   03/31/20 1140  BP: (!) 169/77  Pulse: 76  Resp: 18  Temp: (!) 97.5 F (36.4 C)  SpO2: 95%   Wt Readings from Last 3 Encounters:  03/31/20 167 lb 12.8 oz (76.1 kg)  12/24/19 169 lb 7 oz (76.9 kg)  11/19/19 167 lb 6.4 oz (75.9 kg)   Physical Exam Vitals reviewed.  Constitutional:      Appearance: Normal appearance.  Cardiovascular:     Rate and Rhythm: Normal rate and regular rhythm.     Pulses: Normal pulses.     Heart sounds: Normal heart sounds.  Pulmonary:     Effort: Pulmonary effort is normal.     Breath sounds: Normal breath sounds.  Abdominal:     Palpations: Abdomen is soft. There is no hepatomegaly, splenomegaly or mass.     Tenderness: There is no abdominal tenderness.     Hernia: No hernia is present.  Musculoskeletal:     Right lower leg: No edema.     Left lower leg: No edema.  Neurological:     General: No focal deficit present.     Mental Status: She is alert and oriented to person, place, and time.  Psychiatric:        Mood and Affect: Mood normal.        Behavior: Behavior normal.       LABORATORY DATA:  I have reviewed the labs as listed.  CBC Latest Ref Rng & Units 03/24/2020 12/17/2019 10/15/2019  WBC 4.0 - 10.5 K/uL 5.0 5.1 4.8  Hemoglobin 12.0 - 15.0 g/dL 11.1(L) 10.9(L) 10.6(L)  Hematocrit 36 - 46 % 34.2(L) 33.9(L) 32.3(L)  Platelets 150 - 400 K/uL 299 320 300   CMP Latest Ref Rng & Units 03/24/2020 12/17/2019 10/15/2019  Glucose 70 - 99 mg/dL 93 98 96  BUN 8 - 23 mg/dL _0 Creatinine 0.44 - 1.00 mg/dL 1.25(H) 1.19(H) 1.17(H)  Sodium 135 - 145 mmol/L 140 139 138  Potassium 3.5 - 5.1 mmol/L 4.1 4.5 4.5  Chloride 98 - 111 mmol/L 102 102 103  CO2 22 - 32 mmol/L _1 Calcium 8.9 - 10.3 mg/dL 9.7 9.6 9.5  Total Protein 6.5 - 8.1 g/dL 7.6 7.5 7.4  Total Bilirubin 0.3 - 1.2 mg/dL 0.5 0.8 0.5  Alkaline Phos 38 - 126 U/L 91 95 92  AST 15 - 41 U/L 31 32 35  ALT 0 - 44 U/L _2 Lab Results  Component Value Date   CA125 47.3 (H) 03/03/2017   CA125 31.7 01/06/2017   CA125 16.9 10/05/2016   Lab Results  Component Value Date   LDH 219 (H) 03/24/2020   LDH 198 (H) 11/15/2018   LDH 185 08/13/2018    DIAGNOSTIC IMAGING:  I have independently reviewed the scans and discussed with the patient. No results found.   ASSESSMENT:  1.  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 -3 cycles of carboplatin and paclitaxel followed by debulking surgery by Dr. Denman George on  08/07/2015 followed by 3 more cycles completed on 10/07/2015. -6 cycles of carboplatin and paclitaxel from 08/17/2017 through 11/30/2017 in the second line setting. -Germline mutation testing was negative. -Maintenance Niraparib 300 mg daily started in April 2019.  She was recommended to continue therapy indefinitely until progression by Dr. Denman George. -CTCAP on 12/17/2019 shows stable exam with no evidence of focal recurrence or metastatic disease.  Pelvic floor laxity with cystocele.   PLAN:  1.  Stage IVb ovarian cancer: -She is tolerating niraparib very well.  -I have reviewed her labs which are grossly within normal limits.  CA-125 was 6.2.  MCV was elevated to 110.  LFTs are normal. -I reviewed results of the CT scan with the patient and her husband.  We have discussed again about continuation of niraparib until progression.  We have also discussed rare chance of MDS/AML associated with it. -I plan to see her back in 3 months with repeat labs and CT scans.  2.  Elevated creatinine: -She has mildly elevated creatinine likely from the right knee.  This has slightly increased to 1.25. -I have recommended hydration with 2 to 3 L of water daily.  3.  Back pain and abdominal pain: -Continue tramadol 50 mg as needed which is helping.  4.  Hypertension: -Blood pressure today is 169/77.  Continue lisinopril and metoprolol.    Orders placed this encounter:  No orders of the defined types were placed in this encounter.    Derek Jack, MD [place] [phone]   I, Milinda Antis, am acting as a scribe for Dr. Sanda Linger.  I, Derek Jack MD, have reviewed the above documentation for accuracy and completeness, and I agree with the above.

## 2020-06-03 ENCOUNTER — Other Ambulatory Visit (HOSPITAL_COMMUNITY): Payer: Self-pay | Admitting: Hematology

## 2020-06-03 ENCOUNTER — Other Ambulatory Visit (HOSPITAL_COMMUNITY): Payer: Self-pay | Admitting: Nurse Practitioner

## 2020-06-03 DIAGNOSIS — C562 Malignant neoplasm of left ovary: Secondary | ICD-10-CM

## 2020-06-05 ENCOUNTER — Inpatient Hospital Stay: Payer: Medicare Other | Attending: Gynecologic Oncology

## 2020-06-05 ENCOUNTER — Encounter: Payer: Self-pay | Admitting: Gynecologic Oncology

## 2020-06-05 ENCOUNTER — Inpatient Hospital Stay (HOSPITAL_BASED_OUTPATIENT_CLINIC_OR_DEPARTMENT_OTHER): Payer: Medicare Other | Admitting: Gynecologic Oncology

## 2020-06-05 ENCOUNTER — Other Ambulatory Visit: Payer: Self-pay

## 2020-06-05 VITALS — BP 164/70 | HR 79 | Temp 98.0°F | Resp 16 | Wt 168.6 lb

## 2020-06-05 DIAGNOSIS — Z8249 Family history of ischemic heart disease and other diseases of the circulatory system: Secondary | ICD-10-CM | POA: Diagnosis not present

## 2020-06-05 DIAGNOSIS — Z801 Family history of malignant neoplasm of trachea, bronchus and lung: Secondary | ICD-10-CM | POA: Diagnosis not present

## 2020-06-05 DIAGNOSIS — C562 Malignant neoplasm of left ovary: Secondary | ICD-10-CM | POA: Insufficient documentation

## 2020-06-05 DIAGNOSIS — F209 Schizophrenia, unspecified: Secondary | ICD-10-CM | POA: Insufficient documentation

## 2020-06-05 DIAGNOSIS — Z9221 Personal history of antineoplastic chemotherapy: Secondary | ICD-10-CM | POA: Insufficient documentation

## 2020-06-05 DIAGNOSIS — E782 Mixed hyperlipidemia: Secondary | ICD-10-CM | POA: Diagnosis not present

## 2020-06-05 DIAGNOSIS — Z9079 Acquired absence of other genital organ(s): Secondary | ICD-10-CM | POA: Diagnosis not present

## 2020-06-05 DIAGNOSIS — Z90722 Acquired absence of ovaries, bilateral: Secondary | ICD-10-CM | POA: Insufficient documentation

## 2020-06-05 DIAGNOSIS — Z8042 Family history of malignant neoplasm of prostate: Secondary | ICD-10-CM | POA: Insufficient documentation

## 2020-06-05 DIAGNOSIS — C786 Secondary malignant neoplasm of retroperitoneum and peritoneum: Secondary | ICD-10-CM | POA: Insufficient documentation

## 2020-06-05 DIAGNOSIS — I10 Essential (primary) hypertension: Secondary | ICD-10-CM | POA: Diagnosis not present

## 2020-06-05 DIAGNOSIS — C569 Malignant neoplasm of unspecified ovary: Secondary | ICD-10-CM

## 2020-06-05 DIAGNOSIS — Z79899 Other long term (current) drug therapy: Secondary | ICD-10-CM | POA: Diagnosis not present

## 2020-06-05 DIAGNOSIS — Z90710 Acquired absence of both cervix and uterus: Secondary | ICD-10-CM | POA: Insufficient documentation

## 2020-06-05 DIAGNOSIS — Z809 Family history of malignant neoplasm, unspecified: Secondary | ICD-10-CM | POA: Insufficient documentation

## 2020-06-05 DIAGNOSIS — Z7982 Long term (current) use of aspirin: Secondary | ICD-10-CM | POA: Insufficient documentation

## 2020-06-05 LAB — CBC WITH DIFFERENTIAL (CANCER CENTER ONLY)
Abs Immature Granulocytes: 0.02 10*3/uL (ref 0.00–0.07)
Basophils Absolute: 0.1 10*3/uL (ref 0.0–0.1)
Basophils Relative: 1 %
Eosinophils Absolute: 0.3 10*3/uL (ref 0.0–0.5)
Eosinophils Relative: 4 %
HCT: 33.4 % — ABNORMAL LOW (ref 36.0–46.0)
Hemoglobin: 11.1 g/dL — ABNORMAL LOW (ref 12.0–15.0)
Immature Granulocytes: 0 %
Lymphocytes Relative: 26 %
Lymphs Abs: 1.8 10*3/uL (ref 0.7–4.0)
MCH: 36 pg — ABNORMAL HIGH (ref 26.0–34.0)
MCHC: 33.2 g/dL (ref 30.0–36.0)
MCV: 108.4 fL — ABNORMAL HIGH (ref 80.0–100.0)
Monocytes Absolute: 1 10*3/uL (ref 0.1–1.0)
Monocytes Relative: 15 %
Neutro Abs: 3.7 10*3/uL (ref 1.7–7.7)
Neutrophils Relative %: 54 %
Platelet Count: 328 10*3/uL (ref 150–400)
RBC: 3.08 MIL/uL — ABNORMAL LOW (ref 3.87–5.11)
RDW: 12.9 % (ref 11.5–15.5)
WBC Count: 6.9 10*3/uL (ref 4.0–10.5)
nRBC: 0.3 % — ABNORMAL HIGH (ref 0.0–0.2)

## 2020-06-05 NOTE — Patient Instructions (Signed)
Dr Denman George is placing orders for you to get your blood work checked today.  After you have seen Dr Delton Coombes, please return to see Dr Denman George in April, 2022. You can call her office at [phone] to make this appointment (call after November of this year).

## 2020-06-05 NOTE — Progress Notes (Signed)
Followup Note: Gyn-Onc  Latasha [name] 79 y.o. 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 Ovarian Cancer    follow-up    Assessment/Plan:  [name]  is a 79 y.o.  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 BRCA negative on testing.  S/p salvage 2nd line chemotherapy with carb/taxol completed 11/30/17. Now on Niraparib since March, 2019.  No measurable disease.  Tolerating maintenance PARPi with Zejula. Recommend continuing this indefinitely (as she is tolerating treatment well and is disease free).  I will see Latasha [name] back in 6 months for a pelvic exam as part of surveillance.  We'll check CA 125 and CBC today.   HPI: Latasha [name] is a very pleasant 79 year old G1 who is seena in consultation at the request of Dr Whitney Muse for (clinical) stage IVB ovarian cancer. The patient developed symptoms of progressive abdominal fullness and discomfort over the course of several months. In August, 2016 she presented to the [facility] where imaging of the abdomen and pelvis was obtained for diarrhea, nausea and emesis. Imaging on 04/25/15 revealed large volume ascites, carcinomatosis and peritoneal masses measuring up to 5.1cm. The patient underwent paracentesis with cytology on 04/27/15 which revealed metastatic adenocarcinoma consistent on immunostains with gyn primary.  CT of the chest on 04/27/15 revealed mild left supraclavicular lymphadenopathy, and mediastinal lymphadenopathy consistent with metastatic disease. There were <80m pulmonary nodules also seen which were indeterminant. There were trace pleural effusions seen.   CA 125 on 04/25/15: 3902.  She then went on to receive 3 cycles of paclitaxel and carboplatin chemotherapy with Dr PWhitney Muse Cycle 3 was on 07/01/15.  CA 125 on 07/01/15 was 249.  CT scan on 07/20/15 showed: Previously noted ascites has resolved. No  pneumoperitoneum. Many of the previously noted peritoneal implants are no longer confidently identified on today's examination. There are few smaller implants noted, the largest of which is in th  left side of the abdomen anteriorly measuring 9 x 11 mm. There is also a 10 x 7 mm lesion superficial to the splenic flexure of the colon which is substantially smaller than the prior study (previously 2.8 x 1.7 cm).  There was resolution of the chest adenopathy.  She has tolerated chemotherapy well with a good appetites and good general function.  On 07/31/15 she was taken to the OR for an ex lap, TAH, BSO, omentectomy, radical tumor debulking. Intraoperative findings included: Excellent response to neoadjuvant chemotherapy. Small volume plaques/nodules in left and right posterior cul de sac behind uterus, tumor on left ovary (3cm), multiple 2cm nodules in omentum. No other peritoneal disease. Diaphragm normal.  She had an optimal cytoreduction (R0) with no gross visible disease remaining.  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  Postoperatively she did very well with no postoperative complications.  She went on to receive 3 additional cycles of carboplatin and paclitaxel completed April, 2017. She tolerated therapy very well.  Post treatment scans were unremarkable and showed no residual disease.  Her CA 125 was monitored at 3 monthly intervals. In January, 2018 it was normal at 17. On April, 20th ,2018 it had increased to 32. On June 15th, 2018 it had increased again to 47. CT chest/abdo/pelvis on 03/07/17 showed solitary pertoneal nodule in the gastrosplenic ligament measuring 168mx 1639mincreased from a prior scan where it was 1cm). No additional nodularity  is noted nor ascites.  She was offered secondary cytoreduction surgery followed by chemotherapy, however she declined this and opted for expectant management because she felt good.  On 07/31/17  repeat CT abdo/pelvis was performed. It showed progression of peritoneal disease with an enlargement of a peri-splenic lesion, a new nodule adjacent to the adrenal gland and a new left lower quadrant peritoneal metastases.  She agreed to commend salvage 2nd line chemotherapy with carboplatin and paclitaxel in December, 2018 due to the onset of symptomatic recurrence. She completed 6 cycles of this on 11/30/17 and was then changed to Niraparib maintenance 364m daily starting in March, 2019.  CA 125 was normal at 9 on 03/26/18.  CT scan abd/pelvis in July, 2019 was normal with no evidence of recurrent/progressive disease.  CT scan in December, 2019 was stable with no progression.  CA 125 on 11/15/18 was normal and stable at 6.7. CT scan abd/pelvis on 11/15/18 showed no new progression or recurrence.   CA 125 on 01/22/19 was normal at 6. CA 125 on 03/11/19 was normal at 6.7. CA 125 on 05/07/19 was normal at 6.9. CA 125  On 10/15/19 was normal at 6.2.  CT abd/pelvis on 07/12/19 showed no evidence of recurrent/progressive disease.  Interval Hx:  CA 125 on 12/17/19 was normal at 6.7 CT chest/abd/pelvis on 12/17/19 showed no evidence of recurrent disease.  CA 125 on 03/24/20 was normal at 6.2.  She continues to feel well with no abdominal symptoms and an excellent performance status (ECOG 0).  She denies thrombocytopenia. She has mild occasional nausea.   Current Meds:  Outpatient Encounter Medications as of 06/05/2020  Medication Sig  . acetaminophen (TYLENOL) 500 MG tablet Take 2 tablets (1,000 mg total) by mouth every 12 (twelve) hours.  .Marland KitchenamLODipine (NORVASC) 10 MG tablet Take 10 mg by mouth every evening.   .Marland Kitchenaspirin EC 81 MG tablet Take 81 mg by mouth daily.  . cholecalciferol (VITAMIN D) 25 MCG (1000 UNIT) tablet Take 1,000 Units by mouth daily.  . Cholecalciferol (VITAMIN D-3) 1000 UNITS CAPS Take 1,000 Units by mouth daily.   .Marland Kitchendocusate sodium (COLACE) 100 MG capsule Take 100 mg by mouth 2  (two) times daily.  . folic acid (FOLVITE) 1 MG tablet Take 1 mg by mouth daily.  .Marland Kitchenlidocaine-prilocaine (EMLA) cream Apply to affected area once  . lisinopril (PRINIVIL,ZESTRIL) 40 MG tablet Take 40 mg by mouth daily.   .Marland Kitchenlovastatin (MEVACOR) 10 MG tablet Take 10 mg by mouth at bedtime.  . metoprolol tartrate (LOPRESSOR) 25 MG tablet Take 1 tablet (25 mg total) by mouth 2 (two) times daily.  . mirtazapine (REMERON) 15 MG tablet Take 7.5 mg by mouth at bedtime.  . Multiple Vitamin (MULTIVITAMIN WITH MINERALS) TABS tablet Take 1 tablet by mouth daily.  . ondansetron (ZOFRAN) 8 MG tablet Take 1 tablet (8 mg total) by mouth 2 (two) times daily as needed for refractory nausea / vomiting. Start on day 3 after chemo.  . prochlorperazine (COMPAZINE) 10 MG tablet Take 1 tablet (10 mg total) by mouth every 6 (six) hours as needed (Nausea or vomiting).  . QUEtiapine (SEROQUEL XR) 300 MG 24 hr tablet Take 300 mg by mouth at bedtime.  . sulfaSALAzine (AZULFIDINE) 500 MG tablet Take 500 mg by mouth 3 (three) times daily.  . traMADol (ULTRAM) 50 MG tablet TAKE (1) TABLET BY MOUTH EVERY SIX HOURS AS NEEDED.  .Marland Kitchenvitamin B-12 (CYANOCOBALAMIN) 1000 MCG tablet Take 1 tablet (1,000 mcg total)  by mouth daily.  Marland Kitchen ZEJULA 100 MG capsule TAKE 3 CAPSULES (300 MG) BY MOUTH DAILY.  . [DISCONTINUED] ZEJULA 100 MG capsule TAKE 3 CAPSULES (300 MG) BY MOUTH DAILY.   No facility-administered encounter medications on file as of 06/05/2020.    Allergy: No Known Allergies  Social Hx:   Social History   Socioeconomic History  . Marital status: Single    Spouse name: Not on file  . Number of children: 1  . Years of education: Not on file  . Highest education level: Not on file  Occupational History  . Not on file  Tobacco Use  . Smoking status: Never Smoker  . Smokeless tobacco: Never Used  Vaping Use  . Vaping Use: Never used  Substance and Sexual Activity  . Alcohol use: No  . Drug use: No  . Sexual activity: Not  Currently  Other Topics Concern  . Not on file  Social History Narrative  . Not on file   Social Determinants of Health   Financial Resource Strain:   . Difficulty of Paying Living Expenses: Not on file  Food Insecurity:   . Worried About Charity fundraiser in the Last Year: Not on file  . Ran Out of Food in the Last Year: Not on file  Transportation Needs:   . Lack of Transportation (Medical): Not on file  . Lack of Transportation (Non-Medical): Not on file  Physical Activity:   . Days of Exercise per Week: Not on file  . Minutes of Exercise per Session: Not on file  Stress:   . Feeling of Stress : Not on file  Social Connections:   . Frequency of Communication with Friends and Family: Not on file  . Frequency of Social Gatherings with Friends and Family: Not on file  . Attends Religious Services: Not on file  . Active Member of Clubs or Organizations: Not on file  . Attends Archivist Meetings: Not on file  . Marital Status: Not on file  Intimate Partner Violence:   . Fear of Current or Ex-Partner: Not on file  . Emotionally Abused: Not on file  . Physically Abused: Not on file  . Sexually Abused: Not on file    Past Surgical Hx:  Past Surgical History:  Procedure Laterality Date  . ABDOMINAL HYSTERECTOMY N/A 07/28/2015   Procedure: TOTAL HYSTERECTOMY ABDOMINAL BILATERAL SALPINGO OOPHRORECTOMY;  Surgeon: Everitt Amber, MD;  Location: [facility];  Service: Gynecology;  Laterality: N/A;  . DEBULKING N/A 07/28/2015   Procedure: DEBULKING;  Surgeon: Everitt Amber, MD;  Location: [facility];  Service: Gynecology;  Laterality: N/A;  . LAPAROTOMY N/A 07/28/2015   Procedure: EXPLORATORY LAPAROTOMY;  Surgeon: Everitt Amber, MD;  Location: [facility];  Service: Gynecology;  Laterality: N/A;  . OMENTECTOMY N/A 07/28/2015   Procedure: OMENTECTOMY ;  Surgeon: Everitt Amber, MD;  Location: [facility];  Service: Gynecology;  Laterality: N/A;  . PARACENTESIS  04/27/15  . PORTACATH PLACEMENT Right 04/2015   . REPLACEMENT TOTAL KNEE     right knee in 2003    Past Medical Hx:  Past Medical History:  Diagnosis Date  . Arthritis   . B12 deficiency   . Cataract   . History of blood transfusion   . History of chemotherapy   . Hypertension   . Iron deficiency anemia   . Mixed hyperlipidemia   . Ovarian cancer ([place])   . 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 Schizophrenia ([place])   . Ulcerative colitis ([place])   .  Vitamin D deficiency     Past Gynecological History:  SVD x 1  No LMP recorded. Patient has had a hysterectomy.  Family Hx:  Family History  Problem Relation Age of Onset  . Prostate cancer Brother   . Cancer Paternal Uncle        1 uncle with cancer NOS  . Lung cancer Maternal Grandmother   . Hypertension Maternal Grandfather   . Congestive Heart Failure Mother   . Seizures Sister     Review of Systems:  Constitutional  Feels well,    ENT Normal appearing ears and nares bilaterally Skin/Breast  No rash, sores, jaundice, itching, dryness Cardiovascular  No chest pain, shortness of breath, or edema  Pulmonary  No cough or wheeze.  Gastro Intestinal  No nausea, vomitting, or diarrhoea. No bright red blood per rectum, no abdominal pain, change in bowel movement, or constipation.  Genito Urinary  No frequency, urgency, dysuria, no postmenopausal bleeding Musculo Skeletal  No myalgia, arthralgia, joint swelling or pain  Neurologic  No weakness, numbness, change in gait,  Psychology  No depression, anxiety, insomnia.   Vitals:  Blood pressure (!) 164/70, pulse 79, temperature 98 F (36.7 C), temperature source Tympanic, resp. rate 16, weight 168 lb 9.6 oz (76.5 kg), SpO2 100 %.  Physical Exam: WD in NAD Neck  Supple NROM, without any enlargements.  Lymph Node Survey No cervical supraclavicular or inguinal adenopathy Cardiovascular  Pulse normal rate, regularity and rhythm. S1 and S2 normal.  Lungs  Clear to auscultation bilateraly, without wheezes/crackles/rhonchi.  Good air movement.  Skin  No rash/lesions/breakdown  Psychiatry  Alert and oriented to person, place, and time  Abdomen  Normoactive bowel sounds, abdomen soft, non distended, non-tender and thin without evidence of hernia.  Incision well healed. No palpable masses. Back No CVA tenderness Genito Urinary  Vaginal cuff normal with no lesions. In tact vagina.No bleeding. Surgically absent uterus and cervix. Extremities  No bilateral cyanosis, clubbing or edema.   Thereasa Solo, MD   06/05/2020, 2:53 PM

## 2020-06-06 LAB — CA 125: Cancer Antigen (CA) 125: 6.2 U/mL (ref 0.0–38.1)

## 2020-06-08 ENCOUNTER — Telehealth: Payer: Self-pay

## 2020-06-08 NOTE — Telephone Encounter (Signed)
L/M for Latasha [name] to call back to discuss the results of her CA-125 results from 06-05-20.

## 2020-06-08 NOTE — Telephone Encounter (Signed)
L/M for Ms Skeen to call back to discuss the results of her CA-125 results from 06-05-20.  When she calls back she can be told that her CA-125 was stable and WNL at 6.2 per Melissa Cross,NP.

## 2020-06-08 NOTE — Telephone Encounter (Signed)
Told ms Jenniges the result of CA-125 as noted below. Pt verbalized understanding.

## 2020-07-01 ENCOUNTER — Other Ambulatory Visit: Payer: Self-pay

## 2020-07-01 ENCOUNTER — Ambulatory Visit (HOSPITAL_COMMUNITY)
Admission: RE | Admit: 2020-07-01 | Discharge: 2020-07-01 | Disposition: A | Discharge status: Home / Self Care | Payer: Medicare Other | Source: Ambulatory Visit | Attending: Hematology | Admitting: Hematology

## 2020-07-01 ENCOUNTER — Inpatient Hospital Stay (HOSPITAL_COMMUNITY): Payer: Medicare Other | Attending: Hematology

## 2020-07-01 DIAGNOSIS — Z90722 Acquired absence of ovaries, bilateral: Secondary | ICD-10-CM | POA: Diagnosis not present

## 2020-07-01 DIAGNOSIS — Z8249 Family history of ischemic heart disease and other diseases of the circulatory system: Secondary | ICD-10-CM | POA: Insufficient documentation

## 2020-07-01 DIAGNOSIS — Z8042 Family history of malignant neoplasm of prostate: Secondary | ICD-10-CM | POA: Insufficient documentation

## 2020-07-01 DIAGNOSIS — Z9079 Acquired absence of other genital organ(s): Secondary | ICD-10-CM | POA: Diagnosis not present

## 2020-07-01 DIAGNOSIS — Z90710 Acquired absence of both cervix and uterus: Secondary | ICD-10-CM | POA: Insufficient documentation

## 2020-07-01 DIAGNOSIS — Z79899 Other long term (current) drug therapy: Secondary | ICD-10-CM | POA: Insufficient documentation

## 2020-07-01 DIAGNOSIS — D509 Iron deficiency anemia, unspecified: Secondary | ICD-10-CM | POA: Insufficient documentation

## 2020-07-01 DIAGNOSIS — C562 Malignant neoplasm of left ovary: Secondary | ICD-10-CM | POA: Diagnosis present

## 2020-07-01 DIAGNOSIS — I10 Essential (primary) hypertension: Secondary | ICD-10-CM | POA: Insufficient documentation

## 2020-07-01 DIAGNOSIS — Z9221 Personal history of antineoplastic chemotherapy: Secondary | ICD-10-CM | POA: Insufficient documentation

## 2020-07-01 DIAGNOSIS — E559 Vitamin D deficiency, unspecified: Secondary | ICD-10-CM | POA: Insufficient documentation

## 2020-07-01 DIAGNOSIS — Z7982 Long term (current) use of aspirin: Secondary | ICD-10-CM | POA: Diagnosis not present

## 2020-07-01 DIAGNOSIS — E538 Deficiency of other specified B group vitamins: Secondary | ICD-10-CM | POA: Diagnosis not present

## 2020-07-01 DIAGNOSIS — Z801 Family history of malignant neoplasm of trachea, bronchus and lung: Secondary | ICD-10-CM | POA: Insufficient documentation

## 2020-07-01 DIAGNOSIS — Z923 Personal history of irradiation: Secondary | ICD-10-CM | POA: Insufficient documentation

## 2020-07-01 LAB — COMPREHENSIVE METABOLIC PANEL
ALT: 18 U/L (ref 0–44)
AST: 29 U/L (ref 15–41)
Albumin: 4.5 g/dL (ref 3.5–5.0)
Alkaline Phosphatase: 79 U/L (ref 38–126)
Anion gap: 9 (ref 5–15)
BUN: 13 mg/dL (ref 8–23)
CO2: 28 mmol/L (ref 22–32)
Calcium: 9.6 mg/dL (ref 8.9–10.3)
Chloride: 101 mmol/L (ref 98–111)
Creatinine, Ser: 1.14 mg/dL — ABNORMAL HIGH (ref 0.44–1.00)
GFR, Estimated: 47 mL/min — ABNORMAL LOW (ref >60)
Glucose, Bld: 101 mg/dL — ABNORMAL HIGH (ref 70–99)
Potassium: 4.3 mmol/L (ref 3.5–5.1)
Sodium: 138 mmol/L (ref 135–145)
Total Bilirubin: 0.6 mg/dL (ref 0.3–1.2)
Total Protein: 7.6 g/dL (ref 6.5–8.1)

## 2020-07-01 LAB — CBC WITH DIFFERENTIAL/PLATELET
Abs Immature Granulocytes: 0.02 10*3/uL (ref 0.00–0.07)
Basophils Absolute: 0.1 10*3/uL (ref 0.0–0.1)
Basophils Relative: 1 %
Eosinophils Absolute: 0.3 10*3/uL (ref 0.0–0.5)
Eosinophils Relative: 6 %
HCT: 32.5 % — ABNORMAL LOW (ref 36.0–46.0)
Hemoglobin: 11.1 g/dL — ABNORMAL LOW (ref 12.0–15.0)
Immature Granulocytes: 0 %
Lymphocytes Relative: 22 %
Lymphs Abs: 1.1 10*3/uL (ref 0.7–4.0)
MCH: 37 pg — ABNORMAL HIGH (ref 26.0–34.0)
MCHC: 34.2 g/dL (ref 30.0–36.0)
MCV: 108.3 fL — ABNORMAL HIGH (ref 80.0–100.0)
Monocytes Absolute: 0.8 10*3/uL (ref 0.1–1.0)
Monocytes Relative: 15 %
Neutro Abs: 2.7 10*3/uL (ref 1.7–7.7)
Neutrophils Relative %: 56 %
Platelets: 282 10*3/uL (ref 150–400)
RBC: 3 MIL/uL — ABNORMAL LOW (ref 3.87–5.11)
RDW: 12.9 % (ref 11.5–15.5)
WBC: 5 10*3/uL (ref 4.0–10.5)
nRBC: 0 % (ref 0.0–0.2)

## 2020-07-01 MED ORDER — IOHEXOL 300 MG/ML  SOLN
75.0000 mL | Freq: Once | INTRAMUSCULAR | Status: AC | PRN
  Administered 2020-07-01: 75 mL via INTRAVENOUS

## 2020-07-01 MED ORDER — IOHEXOL 300 MG/ML  SOLN: 100.0000 mL | Freq: Once | INTRAMUSCULAR | Status: DC | PRN

## 2020-07-02 LAB — CA 125: Cancer Antigen (CA) 125: 6.6 U/mL (ref 0.0–38.1)

## 2020-07-08 ENCOUNTER — Other Ambulatory Visit: Payer: Self-pay

## 2020-07-08 ENCOUNTER — Inpatient Hospital Stay (HOSPITAL_BASED_OUTPATIENT_CLINIC_OR_DEPARTMENT_OTHER): Payer: Medicare Other | Admitting: Hematology

## 2020-07-08 VITALS — BP 148/71 | HR 80 | Temp 97.2°F | Resp 18 | Wt 165.7 lb

## 2020-07-08 DIAGNOSIS — C562 Malignant neoplasm of left ovary: Secondary | ICD-10-CM | POA: Diagnosis not present

## 2020-07-08 MED ORDER — TRAMADOL HCL 50 MG PO TABS
ORAL_TABLET | ORAL | 0 refills | Status: DC
Start: 2020-07-08 — End: 2020-10-08

## 2020-07-08 NOTE — Patient Instructions (Signed)
[place] at [facility] Discharge Instructions  You were seen today by Dr. Delton Coombes. He went over your recent results and scans. Dr. Delton Coombes will see you back in 3 months for labs and follow up.   Thank you for choosing [place] at [facility] to provide your oncology and hematology care.  To afford each patient quality time with our provider, please arrive at least 15 minutes before your scheduled appointment time.   If you have a lab appointment with the [facility] please come in thru the Main Entrance and check in at the main information desk  You need to re-schedule your appointment should you arrive 10 or more minutes late.  We strive to give you quality time with our providers, and arriving late affects you and other patients whose appointments are after yours.  Also, if you no show three or more times for appointments you may be dismissed from the clinic at the providers discretion.     Again, thank you for choosing [facility]  Our hope is that these requests will decrease the amount of time that you wait before being seen by our physicians.       _____________________________________________________________  Should you have questions after your visit to [facility], please contact our office at [phone] between the hours of 8:00 a.m. and 4:30 p.m.  Voicemails left after 4:00 p.m. will not be returned until the following business day.  For prescription refill requests, have your pharmacy contact our office and allow 72 hours.    Cancer Center Support Programs:   > Cancer Support Group  2nd Tuesday of the month 1pm-2pm, Journey Room

## 2020-07-08 NOTE — Progress Notes (Signed)
Latasha [name], [place] [id number]   CLINIC:  Medical Oncology/Hematology  PCP:  Patient, No Pcp Per None None   REASON FOR VISIT:  Follow-up for left ovarian cancer  PRIOR THERAPY:  1. Carboplatin and paclitaxel x 3 cycles from 05/19/2015 to 07/01/2015, then 3 more cycles through 10/07/2015. Afterwards, x 6 cycles from 08/17/2017 to 11/30/2017. 2. Debulking surgery with TAH-BSO on 08/07/2015.  NGS Results: Not done  CURRENT THERAPY: Zejula daily  BRIEF ONCOLOGIC HISTORY:  Oncology History  Ovarian cancer ([place])  04/25/2015 - 04/28/2015 Hospital Admission   Nausea/diarrhea.  Oncology consult completed on 04/27/2015.   04/25/2015 Tumor Marker   CA 125- 3902.      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   04/27/2015 Procedure   US Paracentesis- A total of approximately 3700 mL of amber colored fluid was removed. A fluid sample was sent for laboratory analysis.   04/27/2015 Imaging   CT Chest- Mild left supraclavicular lymphadenopathy and moderate mediastinal lymphadenopathy involving the prevascular, subcarinal and bilateral pericardiophrenic nodal chains, likely metastatic.   04/27/2015 Pathology Results   Diagnosis PERITONEAL/ASCITIC FLUID(SPECIMEN 1 OF 1 COLLECTED 04/27/15): MALIGNANT CELLS CONSISTENT WITH METASTATIC ADENOCARCINOMA.  The immunophenotype is most consistent with a gynecologic primary, most likely ovary.   05/08/2015 Miscellaneous   Seen by Dr. Denman George- recommending Carboplatin/Paclitaxel x 3 cycles and return visit to see her (within 1 week of administration of third cycle) to evaluate for optimal sequencing of treatment modalities.   05/13/2015 - 05/15/2015 Hospital Admission   Hospitalized for AKI   05/19/2015 - 07/01/2015 Chemotherapy   Carboplatin/Paclitaxel x 3 cycles     07/28/2015 Surgery   Exploratory laparotomy with total abdominal hysterectomy, bilateral salpingo-oophorectomy, omentectomy radical tumor debulking for ovarian cancer; by Dr. Everitt Amber.   07/28/2015 Pathology Results   Uterus +/- tubes/ovaries, neoplastic, cervix - HIGH GRADE SEROUS CARCINOMA INVOLVING LEFT OVARY, LEFT FALLOPIAN TUBE AND RIGHT FALLOPIAN TUBE. - CERVIX, ENDOMETRIUM AND MYOMETRIUM ARE FREE OF TUMOR. 2. Cul-de-sac biop...   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  The Breast/Ovarian gene panel offered by [facility] includes sequencing and rearrangement analysis for the following 20 genes:  ATM, BARD1, BRCA1, BRCA2, BRIP1, CDH1, CHEK2, EPCAM, FANCC, MLH1, MSH2, MSH6, NBN, PALB2, PMS2, PTEN, RAD51C, RAD51D, TP53, and XRCC2.   The report date is 12/14/2015.  Update:  MSH6 c.2667G>T VUS has been reclassified from a variant of uncertain significance to a likely benign variant based on a combination of sources, e.g., internal data, published literature, population databases and in [place].  The updated report date is October 29, 2019.   12/23/2015 Miscellaneous   Genetic counseling.  Genetic testing was normal, and did not reveal a deleterious mutation in these genes   03/07/2017 Imaging   CT C/A/P IMPRESSION: Chest Impression:  No evidence of metastatic disease in thorax.  Abdomen / Pelvis Impression:  1. Interval increase in size of solitary nodular peritoneal implant in the LEFT upper quadrant . 2. No additional evidence of peritoneal nodularity. 3. No ascites.   07/31/2017 Progression   CT C/A/P: IMPRESSION: 1. Left upper quadrant nodule continues to progress, now measuring 20 x 26 mm. 2. New nodule identified between the in adrenal gland, concerning for metastatic  deposit. 3. Interval development of tiny nodules in the left lower quadrant mesenteric,  concerning for metastases. 4. No ascites. 5. Status post total abdominal hysterectomy and omentectomy.     CANCER STAGING: Cancer Staging Ovarian cancer [facility]) Staging form: Ovary, AJCC 7th Edition - Clinical stage from 04/27/2015: FIGO Stage IVB, calculated as Stage IV (T3c, N1, M1) - Signed by Baird Cancer, PA-C on 09/16/2015 - Pathologic stage from 05/08/2015: FIGO Stage IVB, calculated as Stage IV (TX, N1, M1) - Signed by Everitt Amber, MD on 05/08/2015 - Pathologic stage from 07/28/2015: FIGO Stage IV (T3c, NX, M1) - Signed by Baird Cancer, PA-C on 09/16/2015   INTERVAL HISTORY:  Latasha [name], a 79 y.o. female, returns for routine follow-up of her left ovarian cancer. Latasha [name] was last seen on 03/31/2020.  Today Latasha [name] reports feeling well. Latasha [name] is tolerating the Zejula well. Latasha [name] denies abdominal pain or cramping or N/V/D.   REVIEW OF SYSTEMS:  Review of Systems  Constitutional: Negative for appetite change and fatigue.  Gastrointestinal: Negative for diarrhea, nausea and vomiting.  All other systems reviewed and are negative.   PAST MEDICAL/SURGICAL HISTORY:  Past Medical History:  Diagnosis Date  . Arthritis   . B12 deficiency   . Cataract   . History of blood transfusion   . History of chemotherapy   . Hypertension   . Iron deficiency anemia   . Mixed hyperlipidemia   . Ovarian cancer ([place])   . 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 Schizophrenia ([place])   . Ulcerative colitis ([place])   . Vitamin D deficiency    Past Surgical History:  Procedure Laterality Date  . ABDOMINAL HYSTERECTOMY N/A 07/28/2015   Procedure: TOTAL HYSTERECTOMY ABDOMINAL BILATERAL SALPINGO OOPHRORECTOMY;  Surgeon: Everitt Amber, MD;  Location: [facility];  Service: Gynecology;  Laterality: N/A;  . DEBULKING N/A 07/28/2015   Procedure: DEBULKING;  Surgeon: Everitt Amber, MD;  Location: [facility];  Service: Gynecology;  Laterality: N/A;  . LAPAROTOMY N/A 07/28/2015   Procedure: EXPLORATORY LAPAROTOMY;  Surgeon: Everitt Amber, MD;  Location: [facility];  Service: Gynecology;  Laterality: N/A;  . OMENTECTOMY N/A 07/28/2015   Procedure: OMENTECTOMY ;  Surgeon: Everitt Amber, MD;  Location: [facility];  Service: Gynecology;  Laterality: N/A;  . PARACENTESIS  04/27/15  . PORTACATH PLACEMENT Right 04/2015  . REPLACEMENT TOTAL KNEE     right knee in 2003    SOCIAL HISTORY:  Social History   Socioeconomic History  . Marital status: Single    Spouse name: Not on file  . Number of children: 1  . Years of education: Not on file  . Highest education level: Not on file  Occupational History  . Not on file  Tobacco Use  . Smoking status: Never Smoker  . Smokeless tobacco: Never Used  Vaping Use  . Vaping Use: Never used  Substance and Sexual Activity  . Alcohol use: No  . Drug use: No  . Sexual activity: Not Currently  Other Topics Concern  . Not on file  Social History Narrative  . Not on file   Social Determinants of Health   Financial Resource Strain:   . Difficulty of Paying Living Expenses: Not on file  Food Insecurity:   . Worried About Charity fundraiser in the Last Year: Not on file  . Ran Out of Food in the Last Year: Not on file  Transportation Needs:   . Lack of Transportation (Medical): Not on file  . Lack  of Transportation (Non-Medical): Not on file  Physical Activity:   . Days of Exercise per Week: Not on file  . Minutes of Exercise per Session: Not on file  Stress:   . Feeling of Stress : Not on file  Social Connections:   . Frequency of Communication with Friends and Family: Not on file  . Frequency of Social Gatherings with Friends and Family: Not on file  . Attends Religious Services: Not on file  . Active Member of Clubs or Organizations: Not on file  . Attends Archivist Meetings: Not on file  . Marital Status: Not on file  Intimate Partner Violence:   . Fear of Current or Ex-Partner: Not on file  . Emotionally Abused: Not on file  . Physically Abused: Not on file  .  Sexually Abused: Not on file    FAMILY HISTORY:  Family History  Problem Relation Age of Onset  . Prostate cancer Brother   . Cancer Paternal Uncle        1 uncle with cancer NOS  . Lung cancer Maternal Grandmother   . Hypertension Maternal Grandfather   . Congestive Heart Failure Mother   . Seizures Sister     CURRENT MEDICATIONS:  Current Outpatient Medications  Medication Sig Dispense Refill  . acetaminophen (TYLENOL) 500 MG tablet Take 2 tablets (1,000 mg total) by mouth every 12 (twelve) hours. 30 tablet 0  . amLODipine (NORVASC) 10 MG tablet Take 10 mg by mouth every evening.     Marland Kitchen aspirin EC 81 MG tablet Take 81 mg by mouth daily.    . cholecalciferol (VITAMIN D) 25 MCG (1000 UNIT) tablet Take 1,000 Units by mouth daily.    . Cholecalciferol (VITAMIN D-3) 1000 UNITS CAPS Take 1,000 Units by mouth daily.     Marland Kitchen docusate sodium (COLACE) 100 MG capsule Take 100 mg by mouth 2 (two) times daily.    . folic acid (FOLVITE) 1 MG tablet Take 1 mg by mouth daily.    Marland Kitchen lidocaine-prilocaine (EMLA) cream Apply to affected area once 30 g 3  . lisinopril (PRINIVIL,ZESTRIL) 40 MG tablet Take 40 mg by mouth daily.   2  . lovastatin (MEVACOR) 10 MG tablet Take 10 mg by mouth at bedtime.    . metoprolol tartrate (LOPRESSOR) 25 MG tablet Take 1 tablet (25 mg total) by mouth 2 (two) times daily. 60 tablet 6  . mirtazapine (REMERON) 15 MG tablet Take 7.5 mg by mouth at bedtime.    . Multiple Vitamin (MULTIVITAMIN WITH MINERALS) TABS tablet Take 1 tablet by mouth daily.    . ondansetron (ZOFRAN) 8 MG tablet Take 1 tablet (8 mg total) by mouth 2 (two) times daily as needed for refractory nausea / vomiting. Start on day 3 after chemo. 30 tablet 1  . prochlorperazine (COMPAZINE) 10 MG tablet Take 1 tablet (10 mg total) by mouth every 6 (six) hours as needed (Nausea or vomiting). 60 tablet 3  . QUEtiapine (SEROQUEL XR) 300 MG 24 hr tablet Take 300 mg by mouth at bedtime.    . sulfaSALAzine (AZULFIDINE)  500 MG tablet Take 500 mg by mouth 3 (three) times daily.    . traMADol (ULTRAM) 50 MG tablet TAKE (1) TABLET BY MOUTH EVERY SIX HOURS AS NEEDED. 90 tablet 0  . vitamin B-12 (CYANOCOBALAMIN) 1000 MCG tablet Take 1 tablet (1,000 mcg total) by mouth daily. 30 tablet 12  . ZEJULA 100 MG capsule TAKE 3 CAPSULES (300 MG) BY MOUTH DAILY. [place]  capsule 3   No current facility-administered medications for this visit.    ALLERGIES:  No Known Allergies  PHYSICAL EXAM:  Performance status (ECOG): 1 - Symptomatic but completely ambulatory  Vitals:   07/08/20 0805  BP: (!) 148/71  Pulse: 80  Resp: 18  Temp: (!) 97.2 F (36.2 C)  SpO2: 98%   Wt Readings from Last 3 Encounters:  07/08/20 165 lb 11.2 oz (75.2 kg)  06/05/20 168 lb 9.6 oz (76.5 kg)  03/31/20 167 lb 12.8 oz (76.1 kg)   Physical Exam Vitals reviewed.  Constitutional:      Appearance: Normal appearance.  Cardiovascular:     Rate and Rhythm: Normal rate and regular rhythm.     Pulses: Normal pulses.     Heart sounds: Normal heart sounds.  Pulmonary:     Effort: Pulmonary effort is normal.     Breath sounds: Normal breath sounds.  Abdominal:     Palpations: Abdomen is soft. There is no hepatomegaly, splenomegaly or mass.     Tenderness: There is no abdominal tenderness.     Hernia: No hernia is present.  Musculoskeletal:     Right lower leg: No edema.     Left lower leg: No edema.  Lymphadenopathy:     Upper Body:     Right upper body: No supraclavicular, axillary or pectoral adenopathy.     Left upper body: No supraclavicular, axillary or pectoral adenopathy.     Lower Body: No right inguinal adenopathy. No left inguinal adenopathy.  Neurological:     General: No focal deficit present.     Mental Status: Latasha [name] is alert and oriented to person, place, and time.  Psychiatric:        Mood and Affect: Mood normal.        Behavior: Behavior normal.      LABORATORY DATA:  I have reviewed the labs as listed.  CBC Latest  Ref Rng & Units 07/01/2020 06/05/2020 03/24/2020  WBC 4.0 - 10.5 K/uL 5.0 6.9 5.0  Hemoglobin 12.0 - 15.0 g/dL 11.1(L) 11.1(L) 11.1(L)  Hematocrit 36 - 46 % 32.5(L) 33.4(L) 34.2(L)  Platelets 150 - 400 K/uL 282 328 299   CMP Latest Ref Rng & Units 07/01/2020 03/24/2020 12/17/2019  Glucose 70 - 99 mg/dL 101(H) 93 98  BUN 8 - 23 mg/dL 13 16 12   Creatinine 0.44 - 1.00 mg/dL 1.14(H) 1.25(H) 1.19(H)  Sodium 135 - 145 mmol/L 138 140 139  Potassium 3.5 - 5.1 mmol/L 4.3 4.1 4.5  Chloride 98 - 111 mmol/L 101 102 102  CO2 22 - 32 mmol/L 28 29 27   Calcium 8.9 - 10.3 mg/dL 9.6 9.7 9.6  Total Protein 6.5 - 8.1 g/dL 7.6 7.6 7.5  Total Bilirubin 0.3 - 1.2 mg/dL 0.6 0.5 0.8  Alkaline Phos 38 - 126 U/L 79 91 95  AST 15 - 41 U/L 29 31 32  ALT 0 - 44 U/L 18 18 23    Lab Results  Component Value Date   CA125 6.6 07/01/2020   CA125 6.2 06/05/2020   CA125 6.2 03/24/2020    DIAGNOSTIC IMAGING:  I have independently reviewed the scans and discussed with the patient. CT Abdomen Pelvis W Contrast  Result Date: 07/01/2020 CLINICAL DATA:  Malignant neoplasm of the left ovary. EXAM: CT ABDOMEN AND PELVIS WITH CONTRAST TECHNIQUE: Multidetector CT imaging of the abdomen and pelvis was performed using the standard protocol following bolus administration of intravenous contrast. CONTRAST:  42m OMNIPAQUE IOHEXOL 300 MG/ML  SOLN COMPARISON:  12/17/2019 FINDINGS: Lower chest: 2 mm subpleural nodule in the right lower lobe is unchanged from previous exam. No pleural effusion identified. Hepatobiliary: No suspicious liver abnormality. Gallbladder is unremarkable. Mild prominence of the intrahepatic bile ducts, similar to previous study. CBD is upper limits of normal in caliber measuring 6 mm. Pancreas: Unremarkable. No pancreatic ductal dilatation or surrounding inflammatory changes. Spleen: Normal in size without focal abnormality. Adrenals/Urinary Tract: Normal appearance of the adrenal glands. No kidney mass or  hydronephrosis identified bilaterally. No focal bladder abnormality. Small cystocele, unchanged. Stomach/Bowel: Stomach is nondistended. No small bowel wall thickening, inflammation or distension. Unremarkable appearance of the colon. Vascular/Lymphatic: Aortic atherosclerosis. No abdominopelvic adenopathy. Reproductive: Status post hysterectomy. No adnexal masses. Other: No ascites or focal fluid collections identified. No peritoneal nodule or mass identified. 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 No acute or suspicious osseous findings. IMPRESSION: 1. No acute findings within the abdomen or pelvis. No findings to suggest residual or recurrent tumor or metastatic disease. 2. Small cystocele, unchanged. 3. Aortic atherosclerosis. Aortic Atherosclerosis (ICD10-I70.0). Electronically Signed   By: Kerby Moors M.D.   On: 07/01/2020 14:14     ASSESSMENT:  1. 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 -3 cycles of carboplatin and paclitaxel followed by debulking surgery by Dr. Denman George on 08/07/2015 followed by 3 more cycles completed on 10/07/2015. -6 cycles of carboplatin and paclitaxel from 08/17/2017 through 11/30/2017 in the second line setting. -Germline mutation testing was negative. -Maintenance Niraparib 300 mg daily started in April 2019.  Latasha [name] was recommended to continue therapy indefinitely until progression by Dr. Denman George. -CTCAP on 12/17/2019 shows stable exam with no evidence of focal recurrence or metastatic disease.  Pelvic floor laxity with cystocele. -CTAP with contrast on 07/01/2020 with no residual or recurrent tumor or metastatic disease.   PLAN:  1. Stage IVb ovarian cancer: -Latasha [name] is tolerating niraparib very well. -Physical exam was normal.  Labs from 07/01/2020 shows mildly elevated creatinine of 1.14 and normal LFTs.  Mild anemia with hemoglobin around 11.1.  White count and platelets are  normal. -CA-125 is normal at 6.6. -Reviewed results of the CT scan of the abdomen and pelvis which was normal. -Recommend follow-up in 3 months with repeat labs and CA-125.  2. Elevated creatinine: -Creatinine mildly elevated at 1.14 from niraparib. -Recommend drinking water at least 2 to 3 L/day.  3. Back pain and abdominal pain: -Continue tramadol 50 mg as needed which is helping.  Will give refill.  4. Hypertension: -Continue lisinopril and metoprolol.  Systolic blood pressure is 148.   Orders placed this encounter:  No orders of the defined types were placed in this encounter.    Derek Jack, MD [place] [phone]   I, Milinda Antis, am acting as a scribe for Dr. Sanda Linger.  I, Derek Jack MD, have reviewed the above documentation for accuracy and completeness, and I agree with the above.

## 2020-09-17 ENCOUNTER — Other Ambulatory Visit (HOSPITAL_COMMUNITY): Payer: Self-pay | Admitting: Hematology

## 2020-09-17 DIAGNOSIS — C562 Malignant neoplasm of left ovary: Secondary | ICD-10-CM

## 2020-10-01 ENCOUNTER — Other Ambulatory Visit: Payer: Self-pay

## 2020-10-01 ENCOUNTER — Inpatient Hospital Stay (HOSPITAL_COMMUNITY): Payer: Medicare Other | Attending: Hematology

## 2020-10-01 DIAGNOSIS — C562 Malignant neoplasm of left ovary: Secondary | ICD-10-CM | POA: Insufficient documentation

## 2020-10-01 DIAGNOSIS — Z9221 Personal history of antineoplastic chemotherapy: Secondary | ICD-10-CM | POA: Diagnosis not present

## 2020-10-01 DIAGNOSIS — Z79899 Other long term (current) drug therapy: Secondary | ICD-10-CM | POA: Insufficient documentation

## 2020-10-01 LAB — CBC WITH DIFFERENTIAL/PLATELET
Abs Immature Granulocytes: 0.01 10*3/uL (ref 0.00–0.07)
Basophils Absolute: 0 10*3/uL (ref 0.0–0.1)
Basophils Relative: 1 %
Eosinophils Absolute: 0.3 10*3/uL (ref 0.0–0.5)
Eosinophils Relative: 6 %
HCT: 32.3 % — ABNORMAL LOW (ref 36.0–46.0)
Hemoglobin: 10.6 g/dL — ABNORMAL LOW (ref 12.0–15.0)
Immature Granulocytes: 0 %
Lymphocytes Relative: 21 %
Lymphs Abs: 0.9 10*3/uL (ref 0.7–4.0)
MCH: 36.4 pg — ABNORMAL HIGH (ref 26.0–34.0)
MCHC: 32.8 g/dL (ref 30.0–36.0)
MCV: 111 fL — ABNORMAL HIGH (ref 80.0–100.0)
Monocytes Absolute: 0.7 10*3/uL (ref 0.1–1.0)
Monocytes Relative: 16 %
Neutro Abs: 2.5 10*3/uL (ref 1.7–7.7)
Neutrophils Relative %: 56 %
Platelets: 331 10*3/uL (ref 150–400)
RBC: 2.91 MIL/uL — ABNORMAL LOW (ref 3.87–5.11)
RDW: 13.1 % (ref 11.5–15.5)
WBC: 4.5 10*3/uL (ref 4.0–10.5)
nRBC: 0 % (ref 0.0–0.2)

## 2020-10-01 LAB — COMPREHENSIVE METABOLIC PANEL
ALT: 22 U/L (ref 0–44)
AST: 30 U/L (ref 15–41)
Albumin: 4.1 g/dL (ref 3.5–5.0)
Alkaline Phosphatase: 77 U/L (ref 38–126)
Anion gap: 5 (ref 5–15)
BUN: 16 mg/dL (ref 8–23)
CO2: 29 mmol/L (ref 22–32)
Calcium: 9.5 mg/dL (ref 8.9–10.3)
Chloride: 104 mmol/L (ref 98–111)
Creatinine, Ser: 1.15 mg/dL — ABNORMAL HIGH (ref 0.44–1.00)
GFR, Estimated: 49 mL/min — ABNORMAL LOW (ref >60)
Glucose, Bld: 90 mg/dL (ref 70–99)
Potassium: 4.4 mmol/L (ref 3.5–5.1)
Sodium: 138 mmol/L (ref 135–145)
Total Bilirubin: 0.4 mg/dL (ref 0.3–1.2)
Total Protein: 7.3 g/dL (ref 6.5–8.1)

## 2020-10-02 LAB — CA 125: Cancer Antigen (CA) 125: 6.4 U/mL (ref 0.0–38.1)

## 2020-10-08 ENCOUNTER — Inpatient Hospital Stay (HOSPITAL_BASED_OUTPATIENT_CLINIC_OR_DEPARTMENT_OTHER): Payer: Medicare Other | Admitting: Hematology

## 2020-10-08 ENCOUNTER — Other Ambulatory Visit (HOSPITAL_COMMUNITY): Payer: Self-pay | Admitting: *Deleted

## 2020-10-08 ENCOUNTER — Other Ambulatory Visit: Payer: Self-pay

## 2020-10-08 ENCOUNTER — Encounter (HOSPITAL_COMMUNITY): Payer: Self-pay | Admitting: Hematology

## 2020-10-08 VITALS — BP 170/79 | HR 79 | Temp 96.8°F | Resp 20 | Wt 169.0 lb

## 2020-10-08 DIAGNOSIS — C562 Malignant neoplasm of left ovary: Secondary | ICD-10-CM

## 2020-10-08 MED ORDER — TRAMADOL HCL 50 MG PO TABS: ORAL_TABLET | ORAL | 0 refills | Status: DC

## 2020-10-08 NOTE — Patient Instructions (Signed)
[place] at [facility] Discharge Instructions  You were seen today by Dr. Delton Coombes. He went over your recent results. You will be scheduled for a CT scan of your abdomen before your next visit. Dr. Delton Coombes will see you back in 3 months for labs and follow up.   Thank you for choosing [place] at [facility] to provide your oncology and hematology care.  To afford each patient quality time with our provider, please arrive at least 15 minutes before your scheduled appointment time.   If you have a lab appointment with the [place] please come in thru the Main Entrance and check in at the main information desk  You need to re-schedule your appointment should you arrive 10 or more minutes late.  We strive to give you quality time with our providers, and arriving late affects you and other patients whose appointments are after yours.  Also, if you no show three or more times for appointments you may be dismissed from the clinic at the providers discretion.     Again, thank you for choosing [facility].  Our hope is that these requests will decrease the amount of time that you wait before being seen by our physicians.       _____________________________________________________________  Should you have questions after your visit to [facility], please contact our office at [phone] between the hours of 8:00 a.m. and 4:30 p.m.  Voicemails left after 4:00 p.m. will not be returned until the following business day.  For prescription refill requests, have your pharmacy contact our office and allow 72 hours.    Cancer Center Support Programs:   > Cancer Support Group  2nd Tuesday of the month 1pm-2pm, Journey Room

## 2020-10-08 NOTE — Progress Notes (Signed)
Latasha [name], [place] [id number]   CLINIC:  Medical Oncology/Hematology  PCP:  Patient, No Pcp Per None None   REASON FOR VISIT:  Follow-up for left ovarian cancer  PRIOR THERAPY:  1. Carboplatin and paclitaxel x 3 cycles from 05/19/2015 to 07/01/2015, then 3 more cycles through 10/07/2015. Afterwards, x 6 cycles from 08/17/2017 to 11/30/2017. 2. Debulking surgery with TAH-BSO on 08/07/2015.  NGS Results: Not done  CURRENT THERAPY: Zejula 300 mg QD  BRIEF ONCOLOGIC HISTORY:  Oncology History  Ovarian cancer ([place])  04/25/2015 - 04/28/2015 Hospital Admission   Nausea/diarrhea.  Oncology consult completed on 04/27/2015.   04/25/2015 Tumor Marker   CA 125- 3902.      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   04/27/2015 Procedure   US Paracentesis- A total of approximately 3700 mL of amber colored fluid was removed. A fluid sample was sent for laboratory analysis.   04/27/2015 Imaging   CT Chest- Mild left supraclavicular lymphadenopathy and moderate mediastinal lymphadenopathy involving the prevascular, subcarinal and bilateral pericardiophrenic nodal chains, likely metastatic.   04/27/2015 Pathology Results   Diagnosis PERITONEAL/ASCITIC FLUID(SPECIMEN 1 OF 1 COLLECTED 04/27/15): MALIGNANT CELLS CONSISTENT WITH METASTATIC ADENOCARCINOMA.  The immunophenotype is most consistent with a gynecologic primary, most likely ovary.   05/08/2015 Miscellaneous   Seen by Dr. Denman George- recommending Carboplatin/Paclitaxel x 3 cycles and return visit to see her (within 1 week of administration of third cycle) to evaluate for optimal sequencing of treatment modalities.   05/13/2015 - 05/15/2015 Hospital Admission   Hospitalized for AKI   05/19/2015 - 07/01/2015 Chemotherapy   Carboplatin/Paclitaxel x 3 cycles    07/28/2015 Surgery   Exploratory laparotomy with total abdominal hysterectomy, bilateral salpingo-oophorectomy, omentectomy radical tumor debulking for ovarian cancer; by Dr. Everitt Amber.   07/28/2015 Pathology Results   Uterus +/- tubes/ovaries, neoplastic, cervix - HIGH GRADE SEROUS CARCINOMA INVOLVING LEFT OVARY, LEFT FALLOPIAN TUBE AND RIGHT FALLOPIAN TUBE. - CERVIX, ENDOMETRIUM AND MYOMETRIUM ARE FREE OF TUMOR. 2. Cul-de-sac biop...   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  The Breast/Ovarian gene panel offered by [facility] includes sequencing and rearrangement analysis for the following 20 genes:  ATM, BARD1, BRCA1, BRCA2, BRIP1, CDH1, CHEK2, EPCAM, FANCC, MLH1, MSH2, MSH6, NBN, PALB2, PMS2, PTEN, RAD51C, RAD51D, TP53, and XRCC2.   The report date is 12/14/2015.  Update:  MSH6 c.2667G>T VUS has been reclassified from a variant of uncertain significance to a likely benign variant based on a combination of sources, e.g., internal data, published literature, population databases and in [place].  The updated report date is October 29, 2019.   12/23/2015 Miscellaneous   Genetic counseling.  Genetic testing was normal, and did not reveal a deleterious mutation in these genes   03/07/2017 Imaging   CT C/A/P IMPRESSION: Chest Impression:  No evidence of metastatic disease in thorax.  Abdomen / Pelvis Impression:  1. Interval increase in size of solitary nodular peritoneal implant in the LEFT upper quadrant . 2. No additional evidence of peritoneal nodularity. 3. No ascites.   07/31/2017 Progression   CT C/A/P: IMPRESSION: 1. Left upper quadrant nodule continues to progress, now measuring 20 x 26 mm. 2. New nodule identified between the in adrenal gland, concerning for  metastatic deposit. 3. Interval development of tiny nodules in the left lower quadrant mesenteric,  concerning for metastases. 4. No ascites. 5. Status post total abdominal hysterectomy and omentectomy.     CANCER STAGING: Cancer Staging Ovarian cancer [facility]) Staging form: Ovary, AJCC 7th Edition - Clinical stage from 04/27/2015: FIGO Stage IVB, calculated as Stage IV (T3c, N1, M1) - Signed by Baird Cancer, PA-C on 09/16/2015 - Pathologic stage from 05/08/2015: FIGO Stage IVB, calculated as Stage IV (TX, N1, M1) - Signed by Everitt Amber, MD on 05/08/2015 - Pathologic stage from 07/28/2015: FIGO Stage IV (T3c, NX, M1) - Signed by Baird Cancer, PA-C on 09/16/2015   INTERVAL HISTORY:  Latasha [name], a 80 y.o. female, returns for routine follow-up of her left ovarian cancer. Emi was last seen on 07/08/2020.   Today she is accompanied by her husband and she reports feeling well. She is taking Zejula 300 mg in the evening and is tolerating it well; she denies N/V or abdominal pain and is drinking plenty of water. Her appetite is excellent.  She will see Dr. Denman George some time in March.   REVIEW OF SYSTEMS:  Review of Systems  Constitutional: Negative for appetite change and fatigue.  Gastrointestinal: Negative for abdominal pain, nausea and vomiting.  All other systems reviewed and are negative.   PAST MEDICAL/SURGICAL HISTORY:  Past Medical History:  Diagnosis Date  . Arthritis   . B12 deficiency   . Cataract   . History of blood transfusion   . History of chemotherapy   . Hypertension   . Iron deficiency anemia   . Mixed hyperlipidemia   . Ovarian cancer ([place])   . 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 Schizophrenia ([place])   . Ulcerative colitis ([place])   . Vitamin D deficiency    Past Surgical History:  Procedure Laterality Date  . ABDOMINAL HYSTERECTOMY N/A 07/28/2015   Procedure: TOTAL HYSTERECTOMY ABDOMINAL BILATERAL SALPINGO OOPHRORECTOMY;  Surgeon: Everitt Amber, MD;  Location: [facility];  Service: Gynecology;  Laterality: N/A;  . DEBULKING N/A 07/28/2015   Procedure:  DEBULKING;  Surgeon: Everitt Amber, MD;  Location: [facility];  Service: Gynecology;  Laterality: N/A;  . LAPAROTOMY N/A 07/28/2015   Procedure: EXPLORATORY LAPAROTOMY;  Surgeon: Everitt Amber, MD;  Location: [facility];  Service: Gynecology;  Laterality: N/A;  . OMENTECTOMY N/A 07/28/2015   Procedure: OMENTECTOMY ;  Surgeon: Everitt Amber, MD;  Location: [facility];  Service: Gynecology;  Laterality: N/A;  . PARACENTESIS  04/27/15  . PORTACATH PLACEMENT Right 04/2015  . REPLACEMENT TOTAL KNEE     right knee in 2003    SOCIAL HISTORY:  Social History   Socioeconomic History  . Marital status: Single    Spouse name: Not on file  . Number of children: 1  . Years of education: Not on file  . Highest education level: Not on file  Occupational History  . Not on file  Tobacco Use  . Smoking status: Never Smoker  . Smokeless tobacco: Never Used  Vaping Use  . Vaping Use: Never used  Substance and Sexual Activity  . Alcohol use: No  . Drug use: No  . Sexual activity: Not Currently  Other Topics Concern  . Not on file  Social History Narrative  . Not on file   Social Determinants of Health   Financial Resource Strain: Not on file  Food Insecurity: Not on file  Transportation Needs: Not on file  Physical Activity: Not on file  Stress:  Not on file  Social Connections: Not on file  Intimate Partner Violence: Not on file    FAMILY HISTORY:  Family History  Problem Relation Age of Onset  . Prostate cancer Brother   . Cancer Paternal Uncle        1 uncle with cancer NOS  . Lung cancer Maternal Grandmother   . Hypertension Maternal Grandfather   . Congestive Heart Failure Mother   . Seizures Sister     CURRENT MEDICATIONS:  Current Outpatient Medications  Medication Sig Dispense Refill  . acetaminophen (TYLENOL) 500 MG tablet Take 2 tablets (1,000 mg total) by mouth every 12 (twelve) hours. 30 tablet 0  . amLODipine (NORVASC) 10 MG tablet Take 10 mg by mouth every evening.     Marland Kitchen aspirin EC 81  MG tablet Take 81 mg by mouth daily.    . cholecalciferol (VITAMIN D) 25 MCG (1000 UNIT) tablet Take 1,000 Units by mouth daily.    Marland Kitchen docusate sodium (COLACE) 100 MG capsule Take 100 mg by mouth 2 (two) times daily.    . folic acid (FOLVITE) 1 MG tablet Take 1 mg by mouth daily.    Marland Kitchen lisinopril (PRINIVIL,ZESTRIL) 40 MG tablet Take 40 mg by mouth daily.   2  . lovastatin (MEVACOR) 10 MG tablet Take 10 mg by mouth at bedtime.    . metoprolol tartrate (LOPRESSOR) 25 MG tablet Take 1 tablet (25 mg total) by mouth 2 (two) times daily. 60 tablet 6  . mirtazapine (REMERON) 15 MG tablet Take 7.5 mg by mouth at bedtime.    . Multiple Vitamin (MULTIVITAMIN WITH MINERALS) TABS tablet Take 1 tablet by mouth daily.    . QUEtiapine (SEROQUEL XR) 300 MG 24 hr tablet Take 300 mg by mouth at bedtime.    . sulfaSALAzine (AZULFIDINE) 500 MG tablet Take 500 mg by mouth 3 (three) times daily.    . traMADol (ULTRAM) 50 MG tablet TAKE (1) TABLET BY MOUTH EVERY SIX HOURS AS NEEDED. 90 tablet 0  . vitamin B-12 (CYANOCOBALAMIN) 1000 MCG tablet Take 1 tablet (1,000 mcg total) by mouth daily. 30 tablet 12  . ZEJULA 100 MG capsule TAKE 3 CAPSULES (300 MG) BY MOUTH DAILY. 90 capsule 3  . lidocaine-prilocaine (EMLA) cream Apply to affected area once (Patient not taking: Reported on 10/08/2020) 30 g 3  . melatonin 5 MG TABS Take 5 mg by mouth at bedtime.    . ondansetron (ZOFRAN) 8 MG tablet Take 1 tablet (8 mg total) by mouth 2 (two) times daily as needed for refractory nausea / vomiting. Start on day 3 after chemo. (Patient not taking: Reported on 10/08/2020) 30 tablet 1  . prochlorperazine (COMPAZINE) 10 MG tablet Take 1 tablet (10 mg total) by mouth every 6 (six) hours as needed (Nausea or vomiting). (Patient not taking: Reported on 10/08/2020) 60 tablet 3   No current facility-administered medications for this visit.    ALLERGIES:  No Known Allergies  PHYSICAL EXAM:  Performance status (ECOG): 1 - Symptomatic but  completely ambulatory  Vitals:   10/08/20 1058  BP: (!) 170/79  Pulse: 79  Resp: 20  Temp: (!) 96.8 F (36 C)  SpO2: 97%   Wt Readings from Last 3 Encounters:  10/08/20 169 lb (76.7 kg)  07/08/20 165 lb 11.2 oz (75.2 kg)  06/05/20 168 lb 9.6 oz (76.5 kg)   Physical Exam Vitals reviewed.  Constitutional:      Appearance: Normal appearance.  Cardiovascular:     Rate  and Rhythm: Normal rate and regular rhythm.     Pulses: Normal pulses.     Heart sounds: Normal heart sounds.  Pulmonary:     Effort: Pulmonary effort is normal.     Breath sounds: Normal breath sounds.  Abdominal:     Palpations: Abdomen is soft. There is no hepatomegaly, splenomegaly or mass.     Tenderness: There is no abdominal tenderness.     Hernia: No hernia is present.  Musculoskeletal:     Right lower leg: No edema.     Left lower leg: No edema.  Lymphadenopathy:     Lower Body: No right inguinal adenopathy. No left inguinal adenopathy.  Neurological:     General: No focal deficit present.     Mental Status: She is alert and oriented to person, place, and time.  Psychiatric:        Mood and Affect: Mood normal.        Behavior: Behavior normal.      LABORATORY DATA:  I have reviewed the labs as listed.  CBC Latest Ref Rng & Units 10/01/2020 07/01/2020 06/05/2020  WBC 4.0 - 10.5 K/uL 4.5 5.0 6.9  Hemoglobin 12.0 - 15.0 g/dL 10.6(L) 11.1(L) 11.1(L)  Hematocrit 36.0 - 46.0 % 32.3(L) 32.5(L) 33.4(L)  Platelets 150 - 400 K/uL 331 282 328   CMP Latest Ref Rng & Units 10/01/2020 07/01/2020 03/24/2020  Glucose 70 - 99 mg/dL 90 101(H) 93  BUN 8 - 23 mg/dL 16 13 16   Creatinine 0.44 - 1.00 mg/dL 1.15(H) 1.14(H) 1.25(H)  Sodium 135 - 145 mmol/L 138 138 140  Potassium 3.5 - 5.1 mmol/L 4.4 4.3 4.1  Chloride 98 - 111 mmol/L 104 101 102  CO2 22 - 32 mmol/L 29 28 29   Calcium 8.9 - 10.3 mg/dL 9.5 9.6 9.7  Total Protein 6.5 - 8.1 g/dL 7.3 7.6 7.6  Total Bilirubin 0.3 - 1.2 mg/dL 0.4 0.6 0.5  Alkaline Phos 38  - 126 U/L 77 79 91  AST 15 - 41 U/L 30 29 31   ALT 0 - 44 U/L 22 18 18     DIAGNOSTIC IMAGING:  I have independently reviewed the scans and discussed with the patient. No results found.   ASSESSMENT:  1. 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 -3 cycles of carboplatin and paclitaxel followed by debulking surgery by Dr. Denman George on 08/07/2015 followed by 3 more cycles completed on 10/07/2015. -6 cycles of carboplatin and paclitaxel from 08/17/2017 through 11/30/2017 in the second line setting. -Germline mutation testing was negative. -MaintenanceNiraparib 300 mg daily started in April 2019. She was recommended to continue therapy indefinitely until progression by Dr. Denman George. -CTCAP on 12/17/2019 shows stable exam with no evidence of focal recurrence or metastatic disease. Pelvic floor laxity with cystocele. -CTAP with contrast on 07/01/2020 with no residual or recurrent tumor or metastatic disease.   PLAN:  1. Stage IVb ovarian cancer: -Reviewed last CT scan which did not show any evidence of disease. - Reviewed labs from 10/01/2020 with CA125 level 6.4.  Mild anemia is likely from myelosuppression from niraparib.  LFTs are normal. - Physical examination did not reveal any palpable masses. - Recommend follow-up in 3 months with repeat tumor marker and CT scan.  2. Elevated creatinine: -Creatinine mildly elevated due to niraparib. - Encouraged drinking water 2 to 3 L/day.  3. Back pain and abdominal pain: -Continue tramadol 50 mg as needed.  Refill was sent.  4. Hypertension: -Continue lisinopril and metoprolol.  Blood pressure is slightly high at 170/79. - She reports that her blood pressure is usually high when she comes to the doctor's office.   Orders placed this encounter:  No orders of the defined types were placed in this encounter.    Derek Jack, MD [place] [phone]   I, Milinda Antis,  am acting as a scribe for Dr. Sanda Linger.  I, Derek Jack MD, have reviewed the above documentation for accuracy and completeness, and I agree with the above.

## 2020-10-24 IMAGING — CT CT ABD-PELV W/ CM
2 of 6 series · 16 of 46 positions shown, 18 images · IV contrast (Omnipaque or Isovue)
Comparison: 12/17/2019

CLINICAL DATA: Malignant neoplasm of the left ovary.

EXAM:
CT ABDOMEN AND PELVIS WITH CONTRAST
TECHNIQUE: Multidetector CT imaging of the abdomen and pelvis was performed
using the standard protocol following bolus administration of
intravenous contrast.
CONTRAST:  75mL OMNIPAQUE IOHEXOL 300 MG/ML  SOLN

[Series 2: axial st · axial · 0.80mm/px · z∈[+978,+1318]mm · 13 of 80 slices shown, 15 images]
[im 6/80  soft-tissue]
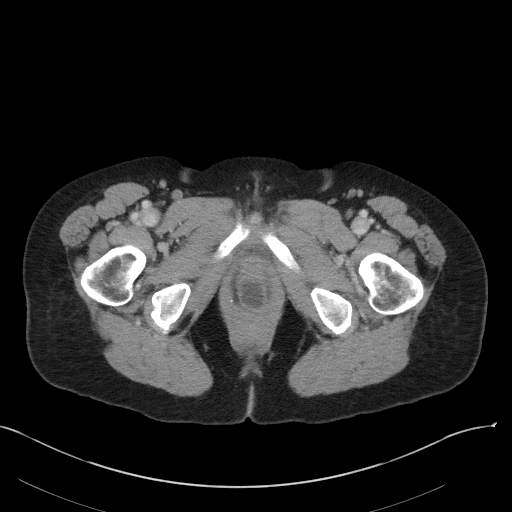
[im 6/80  bone]
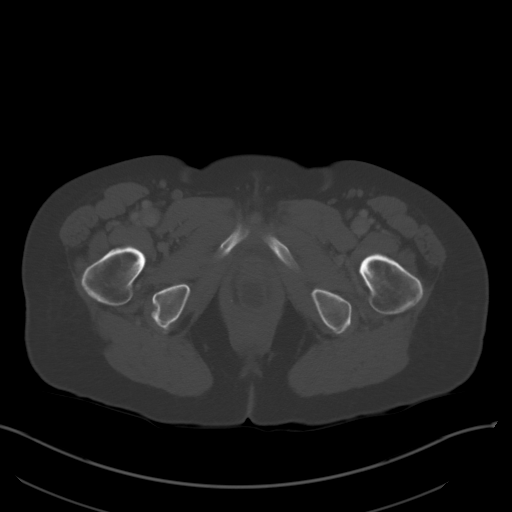
[im 11/80  soft-tissue]
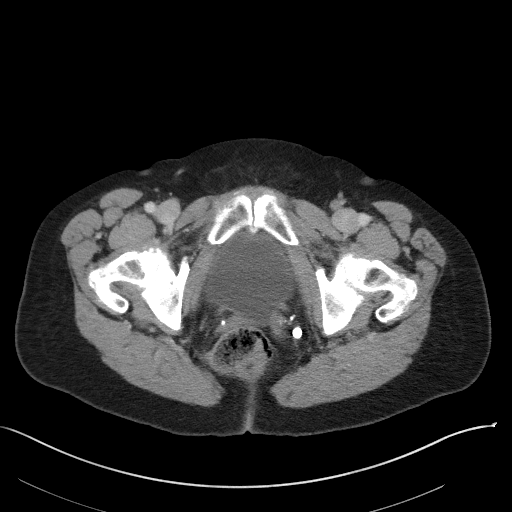
[im 16/80  soft-tissue]
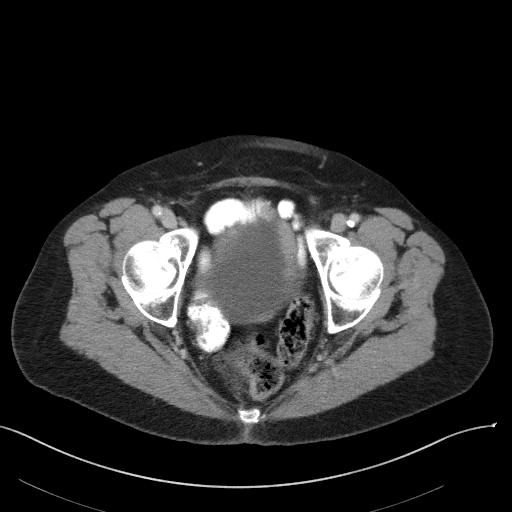
[im 22/80  soft-tissue]
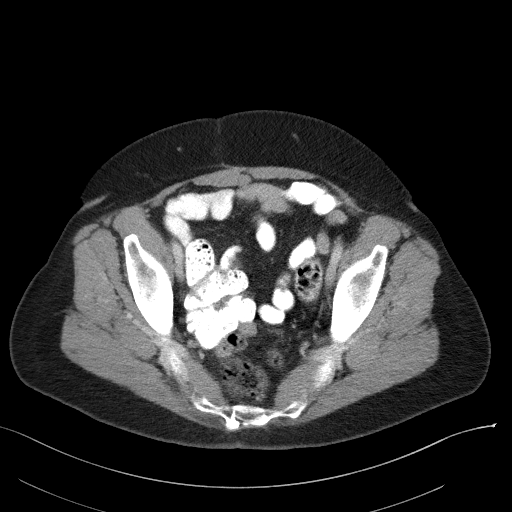
[im 27/80  soft-tissue]
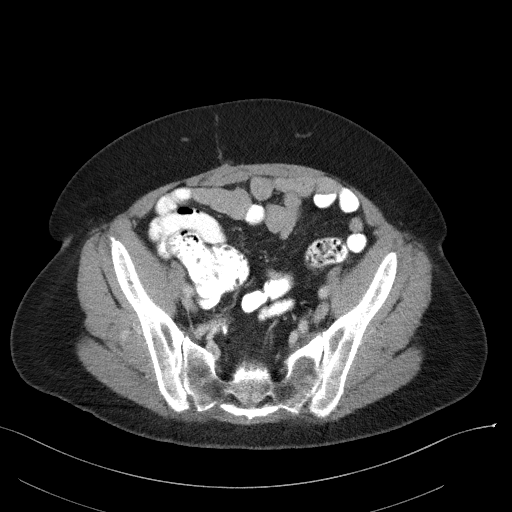
[im 32/80  soft-tissue]
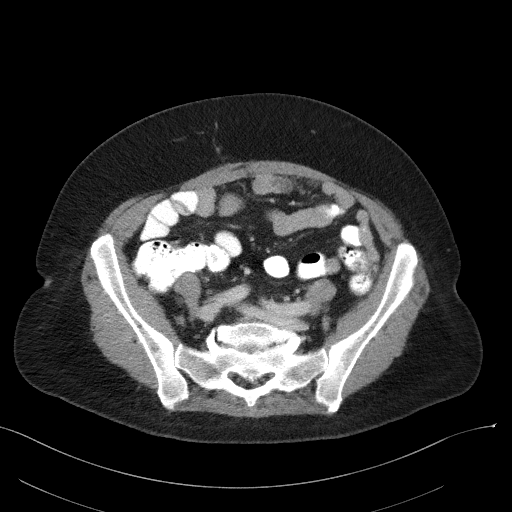
[im 43/80  soft-tissue]
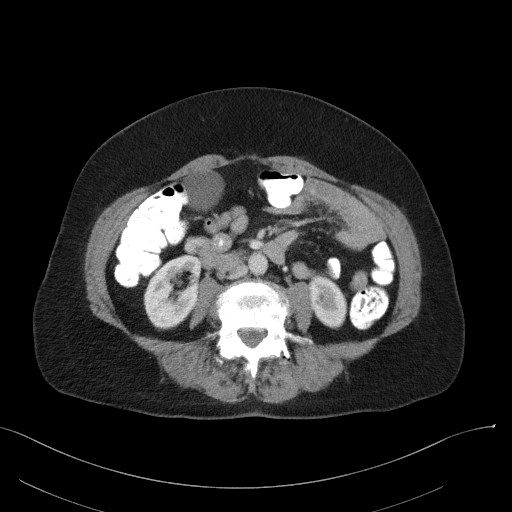
[im 48/80  soft-tissue]
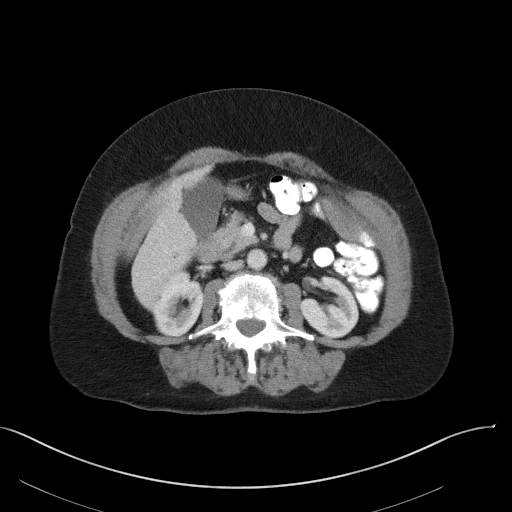
[im 53/80  soft-tissue]
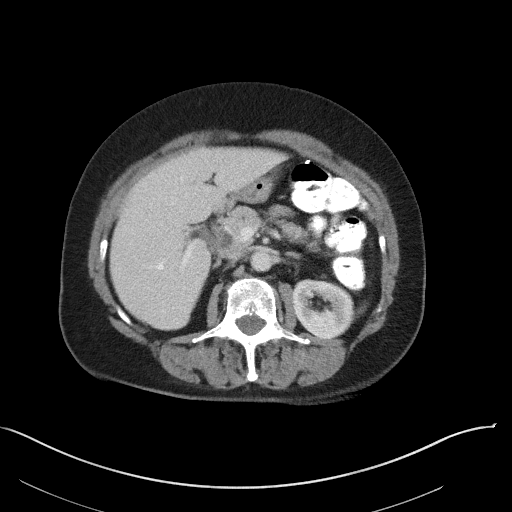
[im 53/80  bone]
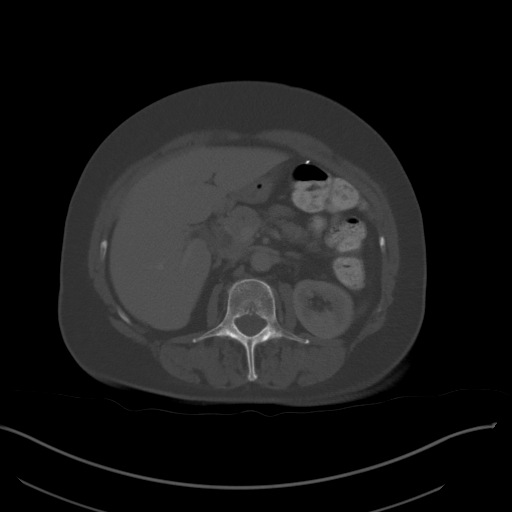
[im 58/80  soft-tissue]
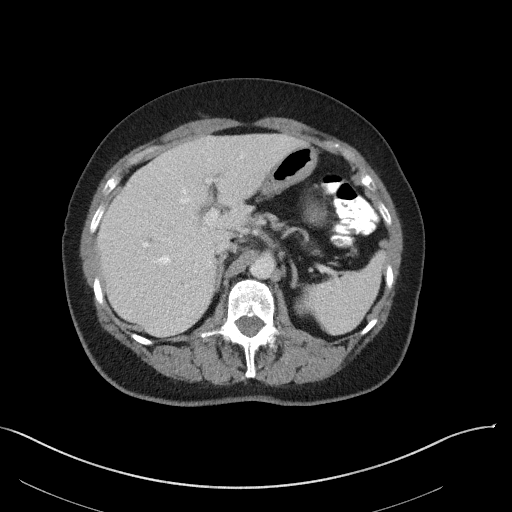
[im 64/80  soft-tissue]
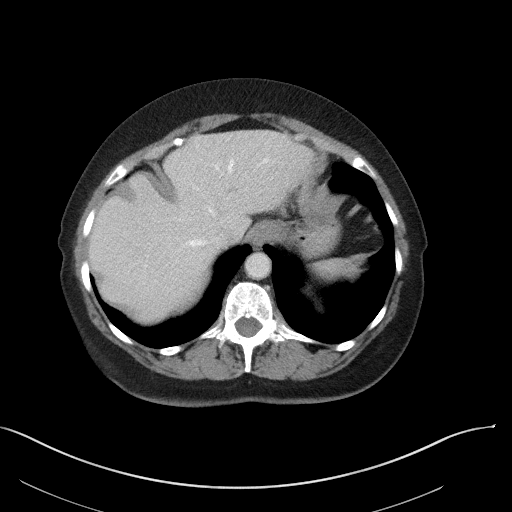
[im 69/80  soft-tissue]
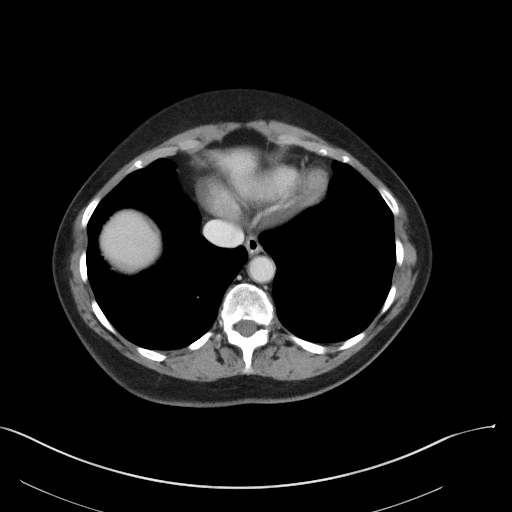
[im 74/80  soft-tissue]
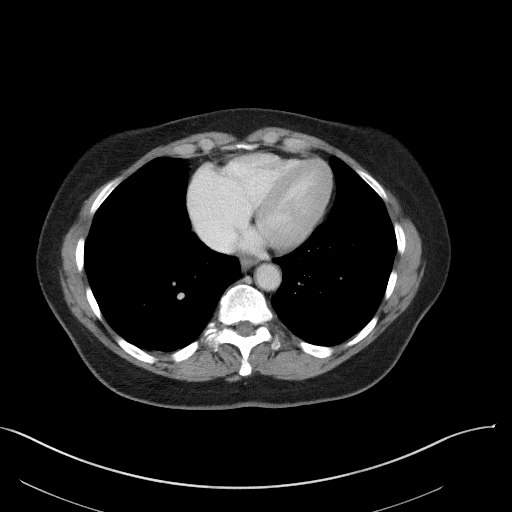

[Series 7: coronal st · coronal · 0.61mm/px · 3 of 100 slices shown]
[im 34/100  soft-tissue]
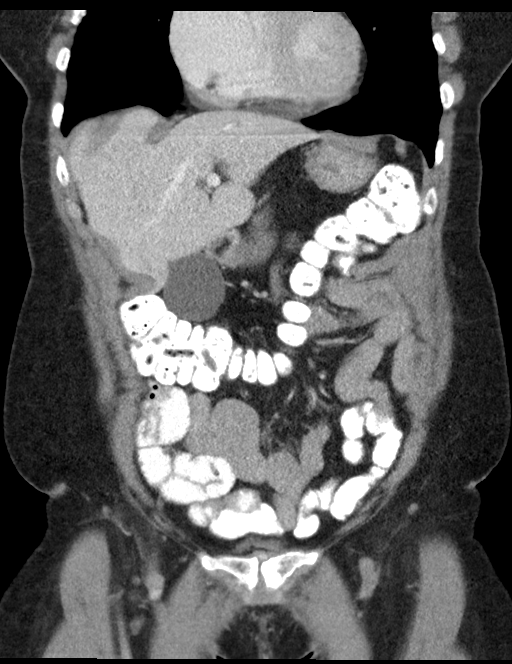
[im 45/100  soft-tissue]
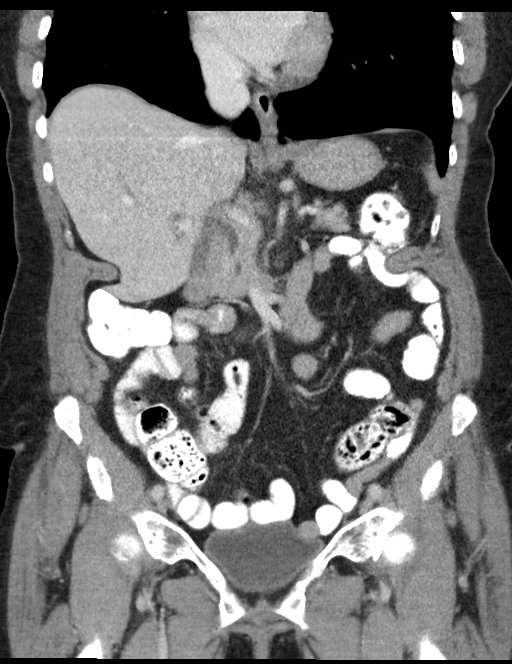
[im 56/100  soft-tissue]
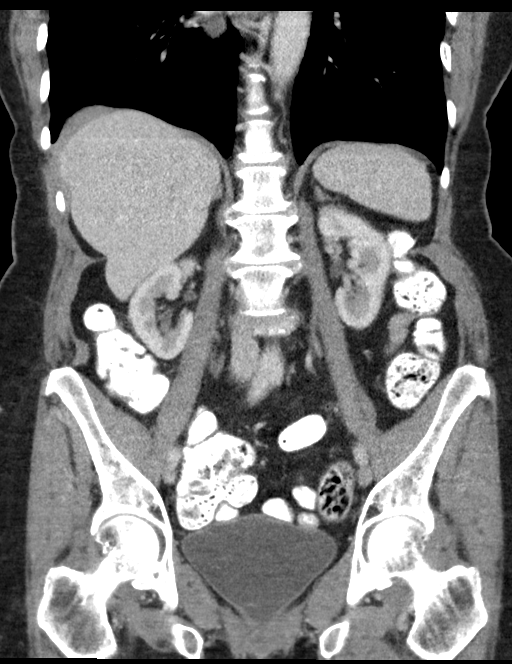

[16 of 46 positions shown; findings below may reference images not displayed]

FINDINGS: Lower chest: 2 mm subpleural nodule in the right lower lobe is
unchanged from previous exam. No pleural effusion identified.

Hepatobiliary: No suspicious liver abnormality. Gallbladder is
unremarkable. Mild prominence of the intrahepatic bile ducts,
similar to previous study. CBD is upper limits of normal in caliber
measuring 6 mm.

Pancreas: Unremarkable. No pancreatic ductal dilatation or
surrounding inflammatory changes.

Spleen: Normal in size without focal abnormality.

Adrenals/Urinary Tract: Normal appearance of the adrenal glands. No
kidney mass or hydronephrosis identified bilaterally. No focal
bladder abnormality. Small cystocele, unchanged.

Stomach/Bowel: Stomach is nondistended. No small bowel wall
thickening, inflammation or distension. Unremarkable appearance of
the colon.

Vascular/Lymphatic: Aortic atherosclerosis. No abdominopelvic
adenopathy.

Reproductive: Status post hysterectomy. No adnexal masses.

Other: No ascites or focal fluid collections identified. No
peritoneal nodule or mass identified.

Musculoskeletal: Degenerative disc disease identified within the
thoracolumbar spine. No acute or suspicious osseous findings.
IMPRESSION: 1. No acute findings within the abdomen or pelvis. No findings to
suggest residual or recurrent tumor or metastatic disease.
2. Small cystocele, unchanged.
3. Aortic atherosclerosis.

Aortic Atherosclerosis ([id number].V).

## 2020-10-29 ENCOUNTER — Telehealth: Payer: Self-pay | Admitting: *Deleted

## 2020-10-29 NOTE — Telephone Encounter (Signed)
Patient called and scheduled a follow up appt for April with Dr Denman George

## 2020-12-11 ENCOUNTER — Other Ambulatory Visit (HOSPITAL_COMMUNITY): Payer: Self-pay

## 2020-12-28 ENCOUNTER — Other Ambulatory Visit (HOSPITAL_COMMUNITY): Payer: Self-pay

## 2020-12-28 MED FILL — Niraparib Tosylate Cap 100 MG (Base Equivalent): ORAL | 30 days supply | Qty: 90 | Fill #0 | Status: AC

## 2020-12-29 ENCOUNTER — Other Ambulatory Visit (HOSPITAL_COMMUNITY): Payer: Self-pay

## 2021-01-08 ENCOUNTER — Inpatient Hospital Stay (HOSPITAL_COMMUNITY): Payer: Medicare Other | Attending: Hematology

## 2021-01-08 ENCOUNTER — Ambulatory Visit (HOSPITAL_COMMUNITY)
Admission: RE | Admit: 2021-01-08 | Discharge: 2021-01-08 | Disposition: A | Discharge status: Home / Self Care | Payer: Medicare Other | Source: Ambulatory Visit | Attending: Hematology | Admitting: Hematology

## 2021-01-08 ENCOUNTER — Other Ambulatory Visit: Payer: Self-pay

## 2021-01-08 DIAGNOSIS — Z79899 Other long term (current) drug therapy: Secondary | ICD-10-CM | POA: Insufficient documentation

## 2021-01-08 DIAGNOSIS — C562 Malignant neoplasm of left ovary: Secondary | ICD-10-CM | POA: Insufficient documentation

## 2021-01-08 DIAGNOSIS — Z9221 Personal history of antineoplastic chemotherapy: Secondary | ICD-10-CM | POA: Insufficient documentation

## 2021-01-08 LAB — CBC WITH DIFFERENTIAL/PLATELET
Abs Immature Granulocytes: 0.02 10*3/uL (ref 0.00–0.07)
Basophils Absolute: 0.1 10*3/uL (ref 0.0–0.1)
Basophils Relative: 1 %
Eosinophils Absolute: 0.2 10*3/uL (ref 0.0–0.5)
Eosinophils Relative: 4 %
HCT: 32.6 % — ABNORMAL LOW (ref 36.0–46.0)
Hemoglobin: 10.6 g/dL — ABNORMAL LOW (ref 12.0–15.0)
Immature Granulocytes: 0 %
Lymphocytes Relative: 22 %
Lymphs Abs: 1.3 10*3/uL (ref 0.7–4.0)
MCH: 35.8 pg — ABNORMAL HIGH (ref 26.0–34.0)
MCHC: 32.5 g/dL (ref 30.0–36.0)
MCV: 110.1 fL — ABNORMAL HIGH (ref 80.0–100.0)
Monocytes Absolute: 0.8 10*3/uL (ref 0.1–1.0)
Monocytes Relative: 13 %
Neutro Abs: 3.5 10*3/uL (ref 1.7–7.7)
Neutrophils Relative %: 60 %
Platelets: 316 10*3/uL (ref 150–400)
RBC: 2.96 MIL/uL — ABNORMAL LOW (ref 3.87–5.11)
RDW: 13.1 % (ref 11.5–15.5)
WBC: 5.9 10*3/uL (ref 4.0–10.5)
nRBC: 0 % (ref 0.0–0.2)

## 2021-01-08 LAB — COMPREHENSIVE METABOLIC PANEL
ALT: 25 U/L (ref 0–44)
AST: 32 U/L (ref 15–41)
Albumin: 4.5 g/dL (ref 3.5–5.0)
Alkaline Phosphatase: 79 U/L (ref 38–126)
Anion gap: 10 (ref 5–15)
BUN: 11 mg/dL (ref 8–23)
CO2: 28 mmol/L (ref 22–32)
Calcium: 9.6 mg/dL (ref 8.9–10.3)
Chloride: 101 mmol/L (ref 98–111)
Creatinine, Ser: 1.18 mg/dL — ABNORMAL HIGH (ref 0.44–1.00)
GFR, Estimated: 48 mL/min — ABNORMAL LOW (ref >60)
Glucose, Bld: 102 mg/dL — ABNORMAL HIGH (ref 70–99)
Potassium: 4.1 mmol/L (ref 3.5–5.1)
Sodium: 139 mmol/L (ref 135–145)
Total Bilirubin: 0.6 mg/dL (ref 0.3–1.2)
Total Protein: 7.5 g/dL (ref 6.5–8.1)

## 2021-01-08 MED ORDER — IOHEXOL 300 MG/ML  SOLN
75.0000 mL | Freq: Once | INTRAMUSCULAR | Status: AC | PRN
  Administered 2021-01-08: 75 mL via INTRAVENOUS

## 2021-01-09 LAB — CA 125: Cancer Antigen (CA) 125: 6.3 U/mL (ref 0.0–38.1)

## 2021-01-12 ENCOUNTER — Inpatient Hospital Stay: Payer: Medicare Other | Attending: Gynecologic Oncology | Admitting: Gynecologic Oncology

## 2021-01-12 ENCOUNTER — Telehealth: Payer: Self-pay

## 2021-01-12 ENCOUNTER — Other Ambulatory Visit: Payer: Self-pay

## 2021-01-12 ENCOUNTER — Encounter: Payer: Self-pay | Admitting: Gynecologic Oncology

## 2021-01-12 ENCOUNTER — Other Ambulatory Visit (HOSPITAL_COMMUNITY): Payer: Self-pay | Admitting: Hematology

## 2021-01-12 VITALS — BP 172/88 | HR 71 | Temp 97.6°F | Resp 16 | Ht 66.0 in | Wt 165.0 lb

## 2021-01-12 DIAGNOSIS — Z90710 Acquired absence of both cervix and uterus: Secondary | ICD-10-CM | POA: Insufficient documentation

## 2021-01-12 DIAGNOSIS — F209 Schizophrenia, unspecified: Secondary | ICD-10-CM | POA: Insufficient documentation

## 2021-01-12 DIAGNOSIS — Z90722 Acquired absence of ovaries, bilateral: Secondary | ICD-10-CM | POA: Diagnosis not present

## 2021-01-12 DIAGNOSIS — I10 Essential (primary) hypertension: Secondary | ICD-10-CM | POA: Diagnosis not present

## 2021-01-12 DIAGNOSIS — C786 Secondary malignant neoplasm of retroperitoneum and peritoneum: Secondary | ICD-10-CM | POA: Diagnosis not present

## 2021-01-12 DIAGNOSIS — C569 Malignant neoplasm of unspecified ovary: Secondary | ICD-10-CM | POA: Diagnosis present

## 2021-01-12 DIAGNOSIS — Z7982 Long term (current) use of aspirin: Secondary | ICD-10-CM | POA: Diagnosis not present

## 2021-01-12 DIAGNOSIS — K5190 Ulcerative colitis, unspecified, without complications: Secondary | ICD-10-CM | POA: Diagnosis not present

## 2021-01-12 DIAGNOSIS — Z79899 Other long term (current) drug therapy: Secondary | ICD-10-CM | POA: Insufficient documentation

## 2021-01-12 DIAGNOSIS — Z9221 Personal history of antineoplastic chemotherapy: Secondary | ICD-10-CM | POA: Diagnosis not present

## 2021-01-12 DIAGNOSIS — K5090 Crohn's disease, unspecified, without complications: Secondary | ICD-10-CM | POA: Insufficient documentation

## 2021-01-12 NOTE — Telephone Encounter (Signed)
LM stating that a survivorship care plan will be mailed to her as we did not get it to her at her visit today.   It will be mailed to her.  It will be in a white envelope.  She can call the office if she has any questions about it when she receives.  It is a summary of her pathology and staging and what needs to be done at future visits and some current resources if needed.

## 2021-01-12 NOTE — Patient Instructions (Signed)
Please notify Dr Denman George at phone number [phone] if you notice vaginal bleeding, new pelvic or abdominal pains, bloating, feeling full easy, or a change in bladder or bowel function.   Please follow-up with Dr Raliegh Ip as scheduled.  Please contact Dr Serita Grit office (at [phone]) in July to request an appointment with her for October, 2022.

## 2021-01-12 NOTE — Progress Notes (Signed)
Followup Note: Gyn-Onc  Larina Bras 80 y.o. 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 Malignant neoplasm of ovary, unspecified laterality [facility])    Assessment/Plan:  Ms. Daniel Johndrow  is a 80 y.o.  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 BRCA negative on testing.  S/p salvage 2nd line chemotherapy with carb/taxol completed 11/30/17. Now on Niraparib since March, 2019.  No measurable disease.  Tolerating maintenance PARPi with Zejula. Recommend continuing this indefinitely or up to five years (as she is tolerating treatment well and is disease free).  I will see Melenda back in 6 months for a pelvic exam as part of surveillance.   HPI: Teniqua Marron is a very pleasant 80 year old G1 who is seena in consultation at the request of Dr Whitney Muse for (clinical) stage IVB ovarian cancer. The patient developed symptoms of progressive abdominal fullness and discomfort over the course of several months. In August, 2016 she presented to the [facility] where imaging of the abdomen and pelvis was obtained for diarrhea, nausea and emesis. Imaging on 04/25/15 revealed large volume ascites, carcinomatosis and peritoneal masses measuring up to 5.1cm. The patient underwent paracentesis with cytology on 04/27/15 which revealed metastatic adenocarcinoma consistent on immunostains with gyn primary.  CT of the chest on 04/27/15 revealed mild left supraclavicular lymphadenopathy, and mediastinal lymphadenopathy consistent with metastatic disease. There were <47m pulmonary nodules also seen which were indeterminant. There were trace pleural effusions seen.   CA 125 on 04/25/15: 3902.  She then went on to receive 3 cycles of paclitaxel and carboplatin chemotherapy with Dr PWhitney Muse Cycle 3 was on 07/01/15.  CA 125 on 07/01/15 was 249.  CT scan on 07/20/15 showed: Previously noted ascites has resolved.  No pneumoperitoneum. Many of the previously noted peritoneal implants are no longer confidently identified on today's examination. There are few smaller implants noted, the largest of which is in th  left side of the abdomen anteriorly measuring 9 x 11 mm. There is also a 10 x 7 mm lesion superficial to the splenic flexure of the colon which is substantially smaller than the prior study (previously 2.8 x 1.7 cm).  There was resolution of the chest adenopathy.  She has tolerated chemotherapy well with a good appetites and good general function.  On 07/31/15 she was taken to the OR for an ex lap, TAH, BSO, omentectomy, radical tumor debulking. Intraoperative findings included: Excellent response to neoadjuvant chemotherapy. Small volume plaques/nodules in left and right posterior cul de sac behind uterus, tumor on left ovary (3cm), multiple 2cm nodules in omentum. No other peritoneal disease. Diaphragm normal.  She had an optimal cytoreduction (R0) with no gross visible disease remaining.  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  Postoperatively she did very well with no postoperative complications.  She went on to receive 3 additional cycles of carboplatin and paclitaxel completed April, 2017. She tolerated therapy very well.  Post treatment scans were unremarkable and showed no residual disease.  Her CA 125 was monitored at 3 monthly intervals. In January, 2018 it was normal at 17. On April, 20th ,2018 it had increased to 32. On June 15th, 2018 it had increased again to 47. CT chest/abdo/pelvis on 03/07/17 showed solitary pertoneal nodule in the gastrosplenic ligament measuring 170mx 1634mincreased from a prior scan where it was 1cm). No additional nodularity is noted  nor ascites.  She was offered secondary cytoreduction surgery followed by chemotherapy, however she declined this and opted for expectant management because she felt good.  On 07/31/17  repeat CT abdo/pelvis was performed. It showed progression of peritoneal disease with an enlargement of a peri-splenic lesion, a new nodule adjacent to the adrenal gland and a new left lower quadrant peritoneal metastases.  She agreed to commend salvage 2nd line chemotherapy with carboplatin and paclitaxel in December, 2018 due to the onset of symptomatic recurrence. She completed 6 cycles of this on 11/30/17 and was then changed to Niraparib maintenance 381m daily starting in March, 2019.  CA 125 was normal at 9 on 03/26/18.  CT scan abd/pelvis in July, 2019 was normal with no evidence of recurrent/progressive disease.  CT scan in December, 2019 was stable with no progression.  CA 125 on 11/15/18 was normal and stable at 6.7. CT scan abd/pelvis on 11/15/18 showed no new progression or recurrence.   CA 125 on 01/22/19 was normal at 6. CA 125 on 03/11/19 was normal at 6.7. CA 125 on 05/07/19 was normal at 6.9. CA 125  On 10/15/19 was normal at 6.2.  CT abd/pelvis on 07/12/19 showed no evidence of recurrent/progressive disease.  Interval Hx:  CA 125 on 12/17/19 was normal at 6.7 CT chest/abd/pelvis on 12/17/19 showed no evidence of recurrent disease.  CA 125 on 03/24/20 was normal at 6.2. CA 125 on 10/01/20 was normal at 6.4 CA 125 on 01/08/21 was normal at 6.3.  CT scan on 01/08/21 was normal and showed no evidence of recurrence.   She continues to feel well with no abdominal symptoms and an excellent performance status (ECOG 0).  She denies thrombocytopenia. She has mild occasional nausea.   Current Meds:  Outpatient Encounter Medications as of 01/12/2021  Medication Sig  . acetaminophen (TYLENOL) 500 MG tablet Take 2 tablets (1,000 mg total) by mouth every 12 (twelve) hours.  .Marland KitchenamLODipine (NORVASC) 10 MG tablet Take 10 mg by mouth every evening.   .Marland Kitchenaspirin EC 81 MG tablet Take 81 mg by mouth daily.  . cholecalciferol (VITAMIN D) 25 MCG (1000 UNIT) tablet Take 1,000 Units by mouth daily.  .Marland Kitchen docusate sodium (COLACE) 100 MG capsule Take 100 mg by mouth 2 (two) times daily.  . folic acid (FOLVITE) 1 MG tablet Take 1 mg by mouth daily.  .Marland Kitchenlisinopril (PRINIVIL,ZESTRIL) 40 MG tablet Take 40 mg by mouth daily.   .Marland Kitchenlovastatin (MEVACOR) 10 MG tablet Take 10 mg by mouth at bedtime.  . melatonin 5 MG TABS Take 5 mg by mouth at bedtime.  . metoprolol tartrate (LOPRESSOR) 25 MG tablet Take 1 tablet (25 mg total) by mouth 2 (two) times daily.  . mirtazapine (REMERON) 7.5 MG tablet Take 7.5 mg by mouth at bedtime.  . Multiple Vitamin (MULTIVITAMIN WITH MINERALS) TABS tablet Take 1 tablet by mouth daily.  . niraparib tosylate (ZEJULA) 100 MG capsule TAKE 3 CAPSULES (300 MG) BY MOUTH DAILY. (Patient taking differently: Take 300 mg by mouth at bedtime.)  . QUEtiapine (SEROQUEL XR) 300 MG 24 hr tablet Take 300 mg by mouth at bedtime.  . sulfaSALAzine (AZULFIDINE) 500 MG tablet Take 500 mg by mouth 3 (three) times daily.  . traMADol (ULTRAM) 50 MG tablet TAKE (1) TABLET BY MOUTH EVERY SIX HOURS AS NEEDED.  .Marland Kitchenvitamin B-12 (CYANOCOBALAMIN) 1000 MCG tablet Take 1 tablet (1,000 mcg total) by mouth daily.  . ondansetron (ZOFRAN) 8 MG tablet Take 1 tablet (8  mg total) by mouth 2 (two) times daily as needed for refractory nausea / vomiting. Start on day 3 after chemo. (Patient not taking: No sig reported)  . prochlorperazine (COMPAZINE) 10 MG tablet Take 1 tablet (10 mg total) by mouth every 6 (six) hours as needed (Nausea or vomiting). (Patient not taking: No sig reported)  . [DISCONTINUED] lidocaine-prilocaine (EMLA) cream Apply to affected area once (Patient not taking: Reported on 10/08/2020)  . [DISCONTINUED] mirtazapine (REMERON) 15 MG tablet Take 7.5 mg by mouth at bedtime.  . [DISCONTINUED] ZEJULA 100 MG capsule TAKE 3 CAPSULES (300 MG) BY MOUTH DAILY.   No facility-administered encounter medications on file as of 01/12/2021.    Allergy: No Known Allergies  Social Hx:   Social History    Socioeconomic History  . Marital status: Single    Spouse name: Not on file  . Number of children: 1  . Years of education: Not on file  . Highest education level: Not on file  Occupational History  . Not on file  Tobacco Use  . Smoking status: Never Smoker  . Smokeless tobacco: Never Used  Vaping Use  . Vaping Use: Never used  Substance and Sexual Activity  . Alcohol use: No  . Drug use: No  . Sexual activity: Not Currently  Other Topics Concern  . Not on file  Social History Narrative  . Not on file   Social Determinants of Health   Financial Resource Strain: Not on file  Food Insecurity: Not on file  Transportation Needs: Not on file  Physical Activity: Not on file  Stress: Not on file  Social Connections: Not on file  Intimate Partner Violence: Not on file    Past Surgical Hx:  Past Surgical History:  Procedure Laterality Date  . ABDOMINAL HYSTERECTOMY N/A 07/28/2015   Procedure: TOTAL HYSTERECTOMY ABDOMINAL BILATERAL SALPINGO OOPHRORECTOMY;  Surgeon: Everitt Amber, MD;  Location: [facility];  Service: Gynecology;  Laterality: N/A;  . DEBULKING N/A 07/28/2015   Procedure: DEBULKING;  Surgeon: Everitt Amber, MD;  Location: [facility];  Service: Gynecology;  Laterality: N/A;  . LAPAROTOMY N/A 07/28/2015   Procedure: EXPLORATORY LAPAROTOMY;  Surgeon: Everitt Amber, MD;  Location: [facility];  Service: Gynecology;  Laterality: N/A;  . OMENTECTOMY N/A 07/28/2015   Procedure: OMENTECTOMY ;  Surgeon: Everitt Amber, MD;  Location: [facility];  Service: Gynecology;  Laterality: N/A;  . PARACENTESIS  04/27/15  . PORTACATH PLACEMENT Right 04/2015  . REPLACEMENT TOTAL KNEE     right knee in 2003    Past Medical Hx:  Past Medical History:  Diagnosis Date  . Arthritis   . B12 deficiency   . Cataract   . History of blood transfusion   . History of chemotherapy   . Hypertension   . Iron deficiency anemia   . Mixed hyperlipidemia   . Ovarian cancer ([place])   . 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  Schizophrenia ([place])   . Ulcerative colitis ([place])   . Vitamin D deficiency     Past Gynecological History:  SVD x 1  No LMP recorded. Patient has had a hysterectomy.  Family Hx:  Family History  Problem Relation Age of Onset  . Prostate cancer Brother   . Cancer Paternal Uncle        1 uncle with cancer NOS  . Lung cancer Maternal Grandmother   . Hypertension Maternal Grandfather   . Congestive Heart Failure Mother   . Seizures Sister     Review of  Systems:  Constitutional  Feels well,    ENT Normal appearing ears and nares bilaterally Skin/Breast  No rash, sores, jaundice, itching, dryness Cardiovascular  No chest pain, shortness of breath, or edema  Pulmonary  No cough or wheeze.  Gastro Intestinal  No nausea, vomitting, or diarrhoea. No bright red blood per rectum, no abdominal pain, change in bowel movement, or constipation.  Genito Urinary  No frequency, urgency, dysuria, no postmenopausal bleeding Musculo Skeletal  No myalgia, arthralgia, joint swelling or pain  Neurologic  No weakness, numbness, change in gait,  Psychology    No depression, anxiety, insomnia.   Vitals:  Blood pressure (!) 172/88, pulse 71, temperature 97.6 F (36.4 C), temperature source Tympanic, resp. rate 16, height 5' 6" (1.676 m), weight 165 lb (74.8 kg), SpO2 98 %.  Physical Exam: WD in NAD Neck  Supple NROM, without any enlargements.  Lymph Node Survey No cervical supraclavicular or inguinal adenopathy Cardiovascular  Pulse normal rate, regularity and rhythm. S1 and S2 normal.  Lungs  Clear to auscultation bilateraly, without wheezes/crackles/rhonchi. Good air movement.  Skin  No rash/lesions/breakdown  Psychiatry  Alert and oriented to person, place, and time  Abdomen  Normoactive bowel sounds, abdomen soft, non distended, non-tender and thin without evidence of hernia.  Incision well healed. No palpable masses. Back No CVA tenderness Genito Urinary  Vaginal cuff normal  with no lesions. In tact vagina.No bleeding. Surgically absent uterus and cervix. Extremities  No bilateral cyanosis, clubbing or edema.   Thereasa Solo, MD   01/12/2021, 2:01 PM

## 2021-01-13 ENCOUNTER — Inpatient Hospital Stay (HOSPITAL_BASED_OUTPATIENT_CLINIC_OR_DEPARTMENT_OTHER): Payer: Medicare Other | Admitting: Hematology

## 2021-01-13 VITALS — BP 162/74 | HR 76 | Temp 96.9°F | Resp 18 | Wt 163.6 lb

## 2021-01-13 DIAGNOSIS — C562 Malignant neoplasm of left ovary: Secondary | ICD-10-CM

## 2021-01-13 DIAGNOSIS — Z9221 Personal history of antineoplastic chemotherapy: Secondary | ICD-10-CM | POA: Diagnosis not present

## 2021-01-13 DIAGNOSIS — Z79899 Other long term (current) drug therapy: Secondary | ICD-10-CM | POA: Diagnosis not present

## 2021-01-13 NOTE — Progress Notes (Signed)
Latasha [name], [place] [id number]   CLINIC:  Medical Oncology/Hematology  PCP:  Patient, No Pcp Per (Inactive) None None   REASON FOR VISIT:  Follow-up for left ovarian cancer  PRIOR THERAPY:  1. Carboplatin and paclitaxel x 3 cycles from 05/19/2015 to 07/01/2015, then 3 more cycles through 10/07/2015. Afterwards, x 6 cycles from 08/17/2017 to 11/30/2017. 2. Debulking surgery with TAH-BSO on 08/07/2015.  NGS Results: Not done  CURRENT THERAPY: Zejula 300 mg daily  BRIEF ONCOLOGIC HISTORY:  Oncology History  Ovarian cancer ([place])  04/25/2015 - 04/28/2015 Hospital Admission   Nausea/diarrhea.  Oncology consult completed on 04/27/2015.   04/25/2015 Tumor Marker   CA 125- 3902.      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   04/27/2015 Procedure   US Paracentesis- A total of approximately 3700 mL of amber colored fluid was removed. A fluid sample was sent for laboratory analysis.   04/27/2015 Imaging   CT Chest- Mild left supraclavicular lymphadenopathy and moderate mediastinal lymphadenopathy involving the prevascular, subcarinal and bilateral pericardiophrenic nodal chains, likely metastatic.   04/27/2015 Pathology Results   Diagnosis PERITONEAL/ASCITIC FLUID(SPECIMEN 1 OF 1 COLLECTED 04/27/15): MALIGNANT CELLS CONSISTENT WITH METASTATIC ADENOCARCINOMA.  The immunophenotype is most consistent with a gynecologic primary, most likely ovary.   05/08/2015 Miscellaneous   Seen by Dr. Denman George- recommending Carboplatin/Paclitaxel x 3 cycles and return visit to see her (within 1 week of administration of third cycle) to evaluate for optimal sequencing of treatment modalities.   05/13/2015 - 05/15/2015 Hospital Admission   Hospitalized for AKI   05/19/2015 - 07/01/2015 Chemotherapy   Carboplatin/Paclitaxel x 3  cycles   07/28/2015 Surgery   Exploratory laparotomy with total abdominal hysterectomy, bilateral salpingo-oophorectomy, omentectomy radical tumor debulking for ovarian cancer; by Dr. Everitt Amber.   07/28/2015 Pathology Results   Uterus +/- tubes/ovaries, neoplastic, cervix - HIGH GRADE SEROUS CARCINOMA INVOLVING LEFT OVARY, LEFT FALLOPIAN TUBE AND RIGHT FALLOPIAN TUBE. - CERVIX, ENDOMETRIUM AND MYOMETRIUM ARE FREE OF TUMOR. 2. Cul-de-sac biop...   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  The Breast/Ovarian gene panel offered by [facility] includes sequencing and rearrangement analysis for the following 20 genes:  ATM, BARD1, BRCA1, BRCA2, BRIP1, CDH1, CHEK2, EPCAM, FANCC, MLH1, MSH2, MSH6, NBN, PALB2, PMS2, PTEN, RAD51C, RAD51D, TP53, and XRCC2.   The report date is 12/14/2015.  Update:  MSH6 c.2667G>T VUS has been reclassified from a variant of uncertain significance to a likely benign variant based on a combination of sources, e.g., internal data, published literature, population databases and in [place].  The updated report date is October 29, 2019.   12/23/2015 Miscellaneous   Genetic counseling.  Genetic testing was normal, and did not reveal a deleterious mutation in these genes   03/07/2017 Imaging   CT C/A/P IMPRESSION: Chest Impression:  No evidence of metastatic disease in thorax.  Abdomen / Pelvis Impression:  1. Interval increase in size of solitary nodular peritoneal implant in the LEFT upper quadrant . 2. No additional evidence of peritoneal nodularity. 3. No ascites.   07/31/2017 Progression   CT C/A/P: IMPRESSION: 1. Left upper quadrant nodule continues to progress, now measuring 20 x 26 mm. 2. New nodule identified between the in adrenal gland, concerning  for metastatic deposit. 3. Interval development of tiny nodules in the left lower  quadrant mesenteric, concerning for metastases. 4. No ascites. 5. Status post total abdominal hysterectomy and omentectomy.     CANCER STAGING: Cancer Staging Ovarian cancer [facility]) Staging form: Ovary, AJCC 7th Edition - Clinical stage from 04/27/2015: FIGO Stage IVB, calculated as Stage IV (T3c, N1, M1) - Signed by Baird Cancer, PA-C on 09/16/2015 - Pathologic stage from 05/08/2015: FIGO Stage IVB, calculated as Stage IV (TX, N1, M1) - Signed by Everitt Amber, MD on 05/08/2015 - Pathologic stage from 07/28/2015: FIGO Stage IV (T3c, NX, M1) - Signed by Baird Cancer, PA-C on 09/16/2015   INTERVAL HISTORY:  [name], a 80 y.o. female, returns for routine follow-up of her left ovarian cancer. Latasha [name] was last seen on 10/08/2020.   Today she is accompanied by her son and she reports feeling well. She is taking Zejula 300 mg daily and is tolerating it well; she denies having N/V/D, abdominal pain or bloating, skin rash or new pains.   REVIEW OF SYSTEMS:  Review of Systems  Constitutional: Positive for fatigue (75%). Negative for appetite change.  Gastrointestinal: Negative for abdominal distention, abdominal pain, diarrhea, nausea and vomiting.  Musculoskeletal: Negative for arthralgias and myalgias.  Skin: Negative for rash.  All other systems reviewed and are negative.   PAST MEDICAL/SURGICAL HISTORY:  Past Medical History:  Diagnosis Date  . Arthritis   . B12 deficiency   . Cataract   . History of blood transfusion   . History of chemotherapy   . Hypertension   . Iron deficiency anemia   . Mixed hyperlipidemia   . Ovarian cancer ([place])   . 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 Schizophrenia ([place])   . Ulcerative colitis ([place])   . Vitamin D deficiency    Past Surgical History:  Procedure Laterality Date  . ABDOMINAL HYSTERECTOMY N/A 07/28/2015   Procedure: TOTAL HYSTERECTOMY ABDOMINAL BILATERAL SALPINGO OOPHRORECTOMY;  Surgeon: Everitt Amber, MD;  Location: [facility];   Service: Gynecology;  Laterality: N/A;  . DEBULKING N/A 07/28/2015   Procedure: DEBULKING;  Surgeon: Everitt Amber, MD;  Location: [facility];  Service: Gynecology;  Laterality: N/A;  . LAPAROTOMY N/A 07/28/2015   Procedure: EXPLORATORY LAPAROTOMY;  Surgeon: Everitt Amber, MD;  Location: [facility];  Service: Gynecology;  Laterality: N/A;  . OMENTECTOMY N/A 07/28/2015   Procedure: OMENTECTOMY ;  Surgeon: Everitt Amber, MD;  Location: [facility];  Service: Gynecology;  Laterality: N/A;  . PARACENTESIS  04/27/15  . PORTACATH PLACEMENT Right 04/2015  . REPLACEMENT TOTAL KNEE     right knee in 2003    SOCIAL HISTORY:  Social History   Socioeconomic History  . Marital status: Single    Spouse name: Not on file  . Number of children: 1  . Years of education: Not on file  . Highest education level: Not on file  Occupational History  . Not on file  Tobacco Use  . Smoking status: Never Smoker  . Smokeless tobacco: Never Used  Vaping Use  . Vaping Use: Never used  Substance and Sexual Activity  . Alcohol use: No  . Drug use: No  . Sexual activity: Not Currently  Other Topics Concern  . Not on file  Social History Narrative  . Not on file   Social Determinants of Health   Financial Resource Strain: Not on file  Food Insecurity: Not on file  Transportation Needs: Not on file  Physical Activity: Not on  file  Stress: Not on file  Social Connections: Not on file  Intimate Partner Violence: Not on file    FAMILY HISTORY:  Family History  Problem Relation Age of Onset  . Prostate cancer Brother   . Cancer Paternal Uncle        1 uncle with cancer NOS  . Lung cancer Maternal Grandmother   . Hypertension Maternal Grandfather   . Congestive Heart Failure Mother   . Seizures Sister     CURRENT MEDICATIONS:  Current Outpatient Medications  Medication Sig Dispense Refill  . acetaminophen (TYLENOL) 500 MG tablet Take 2 tablets (1,000 mg total) by mouth every 12 (twelve) hours. 30 tablet 0  . amLODipine  (NORVASC) 10 MG tablet Take 10 mg by mouth every evening.     Marland Kitchen aspirin EC 81 MG tablet Take 81 mg by mouth daily.    . cholecalciferol (VITAMIN D) 25 MCG (1000 UNIT) tablet Take 1,000 Units by mouth daily.    Marland Kitchen docusate sodium (COLACE) 100 MG capsule Take 100 mg by mouth 2 (two) times daily.    . folic acid (FOLVITE) 1 MG tablet Take 1 mg by mouth daily.    Marland Kitchen lisinopril (PRINIVIL,ZESTRIL) 40 MG tablet Take 40 mg by mouth daily.   2  . lovastatin (MEVACOR) 10 MG tablet Take 10 mg by mouth at bedtime.    . melatonin 5 MG TABS Take 5 mg by mouth at bedtime.    . metoprolol tartrate (LOPRESSOR) 25 MG tablet Take 1 tablet (25 mg total) by mouth 2 (two) times daily. 60 tablet 6  . mirtazapine (REMERON) 7.5 MG tablet Take 7.5 mg by mouth at bedtime.    . Multiple Vitamin (MULTIVITAMIN WITH MINERALS) TABS tablet Take 1 tablet by mouth daily.    . niraparib tosylate (ZEJULA) 100 MG capsule TAKE 3 CAPSULES (300 MG) BY MOUTH DAILY. (Patient taking differently: Take 300 mg by mouth at bedtime.) 90 capsule 3  . ondansetron (ZOFRAN) 8 MG tablet Take 1 tablet (8 mg total) by mouth 2 (two) times daily as needed for refractory nausea / vomiting. Start on day 3 after chemo. 30 tablet 1  . prochlorperazine (COMPAZINE) 10 MG tablet Take 1 tablet (10 mg total) by mouth every 6 (six) hours as needed (Nausea or vomiting). 60 tablet 3  . QUEtiapine (SEROQUEL XR) 300 MG 24 hr tablet Take 300 mg by mouth at bedtime.    . sulfaSALAzine (AZULFIDINE) 500 MG tablet Take 500 mg by mouth 3 (three) times daily.    . traMADol (ULTRAM) 50 MG tablet TAKE (1) TABLET BY MOUTH EVERY SIX HOURS AS NEEDED. 90 tablet 0  . vitamin B-12 (CYANOCOBALAMIN) 1000 MCG tablet TAKE 1 TABLET BY MOUTH ONCE DAILY. 30 tablet 11   No current facility-administered medications for this visit.    ALLERGIES:  No Known Allergies  PHYSICAL EXAM:  Performance status (ECOG): 1 - Symptomatic but completely ambulatory  Vitals:   01/13/21 1122  BP: (!)  162/74  Pulse: 76  Resp: 18  Temp: (!) 96.9 F (36.1 C)  SpO2: 100%   Wt Readings from Last 3 Encounters:  01/13/21 163 lb 9.3 oz (74.2 kg)  01/12/21 165 lb (74.8 kg)  10/08/20 169 lb (76.7 kg)   Physical Exam Vitals reviewed.  Constitutional:      Appearance: Normal appearance.  Cardiovascular:     Rate and Rhythm: Normal rate and regular rhythm.     Pulses: Normal pulses.     Heart sounds:  Normal heart sounds.  Pulmonary:     Effort: Pulmonary effort is normal.     Breath sounds: Normal breath sounds.  Abdominal:     Palpations: Abdomen is soft. There is no hepatomegaly, splenomegaly or mass.     Tenderness: There is no abdominal tenderness.     Hernia: No hernia is present.  Musculoskeletal:     Right lower leg: No edema.     Left lower leg: No edema.  Skin:    Findings: No rash.  Neurological:     General: No focal deficit present.     Mental Status: She is alert and oriented to person, place, and time.  Psychiatric:        Mood and Affect: Mood normal.        Behavior: Behavior normal.      LABORATORY DATA:  I have reviewed the labs as listed.  CBC Latest Ref Rng & Units 01/08/2021 10/01/2020 07/01/2020  WBC 4.0 - 10.5 K/uL 5.9 4.5 5.0  Hemoglobin 12.0 - 15.0 g/dL 10.6(L) 10.6(L) 11.1(L)  Hematocrit 36.0 - 46.0 % 32.6(L) 32.3(L) 32.5(L)  Platelets 150 - 400 K/uL 316 331 282   CMP Latest Ref Rng & Units 01/08/2021 10/01/2020 07/01/2020  Glucose 70 - 99 mg/dL 102(H) 90 101(H)  BUN 8 - 23 mg/dL 11 16 13   Creatinine 0.44 - 1.00 mg/dL 1.18(H) 1.15(H) 1.14(H)  Sodium 135 - 145 mmol/L 139 138 138  Potassium 3.5 - 5.1 mmol/L 4.1 4.4 4.3  Chloride 98 - 111 mmol/L 101 104 101  CO2 22 - 32 mmol/L 28 29 28   Calcium 8.9 - 10.3 mg/dL 9.6 9.5 9.6  Total Protein 6.5 - 8.1 g/dL 7.5 7.3 7.6  Total Bilirubin 0.3 - 1.2 mg/dL 0.6 0.4 0.6  Alkaline Phos 38 - 126 U/L 79 77 79  AST 15 - 41 U/L 32 30 29  ALT 0 - 44 U/L 25 22 18     DIAGNOSTIC IMAGING:  I have independently  reviewed the scans and discussed with the patient. CT Abdomen Pelvis W Contrast  Result Date: 01/08/2021 CLINICAL DATA:  Restaging ovarian cancer. History of hysterectomy and omentectomy. EXAM: CT ABDOMEN AND PELVIS WITH CONTRAST TECHNIQUE: Multidetector CT imaging of the abdomen and pelvis was performed using the standard protocol following bolus administration of intravenous contrast. CONTRAST:  58m OMNIPAQUE IOHEXOL 300 MG/ML  SOLN COMPARISON:  Multiple prior examinations. The most recent is 07/01/2020 FINDINGS: Lower chest: The lung bases are clear of acute process. No pleural effusion or pulmonary lesions. The heart is normal in size. No pericardial effusion. The distal esophagus and aorta are unremarkable. Hepatobiliary: No hepatic lesions or intrahepatic biliary dilatation. The gallbladder is unremarkable. No common bile duct dilatation. Pancreas: No mass, inflammation or ductal dilatation. Spleen: Normal size.  No focal lesions. Adrenals/Urinary Tract: Adrenal glands are normal. The kidneys are normal. The bladder is unremarkable. Stable cystocele. Stomach/Bowel: The stomach, duodenum, small bowel and colon are unremarkable. No acute inflammatory changes, mass lesions or obstructive findings. The terminal ileum is normal. The appendix is normal. Vascular/Lymphatic: The aorta is normal in caliber. No dissection. The branch vessels are patent. The major venous structures are patent. No mesenteric or retroperitoneal mass or adenopathy. Small scattered lymph nodes are noted. Reproductive: Surgically absent. Other: Surgical changes from a prior omentectomy. No findings for recurrent peritoneal implants. Musculoskeletal: No significant bony findings. IMPRESSION: 1. Status post hysterectomy and omentectomy. No findings for recurrent peritoneal implants. 2. No acute abdominal/pelvic findings, mass lesions or adenopathy. 3.  Stable cystocele. Electronically Signed   By: Marijo Sanes M.D.   On: 01/08/2021 15:43      ASSESSMENT:  1. 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 -3 cycles of carboplatin and paclitaxel followed by debulking surgery by Dr. Denman George on 08/07/2015 followed by 3 more cycles completed on 10/07/2015. -6 cycles of carboplatin and paclitaxel from 08/17/2017 through 11/30/2017 in the second line setting. -Germline mutation testing was negative. -MaintenanceNiraparib 300 mg daily started in April 2019. She was recommended to continue therapy indefinitely until progression by Dr. Denman George. -CTCAP on 12/17/2019 shows stable exam with no evidence of focal recurrence or metastatic disease. Pelvic floor laxity with cystocele. -CTAP with contrast on 07/01/2020 with no residual or recurrent tumor or metastatic disease.   PLAN:  1. Stage IVb ovarian cancer: -I reviewed her labs which showed normal CA125 of 6.3.  LFTs were normal. - Reviewed CT AP from 01/08/2021 which did not show any evidence of recurrence or metastatic disease. - Physical examination did not reveal any palpable masses. - We will continue maintenance PARPi with niraparib 300 mg daily until disease progression or up to 5 years.  She was evaluated by Dr. Denman George on 01/12/2021. -  Recommend follow-up in 3 months with repeat tumor marker and physical exam.  2. Elevated creatinine: -Creatinine mildly elevated at 1.18, likely from niraparib.  Continue water intake 2 to 3 L/day.  3. Back pain and abdominal pain: -Continue tramadol 50 mg as needed.  4. Hypertension: -Continue lisinopril and metoprolol.  Blood pressure today is 162/74.   Orders placed this encounter:  Orders Placed This Encounter  Procedures  . Magnesium  . CA 125  . CBC with Differential/Platelet  . Comprehensive metabolic panel     Derek Jack, MD [place] [phone]   I, Milinda Antis, am acting as a scribe for Dr. Sanda Linger.  I, Derek Jack MD, have  reviewed the above documentation for accuracy and completeness, and I agree with the above.

## 2021-01-13 NOTE — Patient Instructions (Signed)
[facility] Discharge Instructions  You were seen today by Dr. Delton Coombes. He went over your recent results and scans. You will get repeat scans every 6 months. Dr. Delton Coombes will see you back in 3 months for labs and follow up.   Thank you for choosing [place] at [facility] to provide your oncology and hematology care.  To afford each patient quality time with our provider, please arrive at least 15 minutes before your scheduled appointment time.   If you have a lab appointment with the [facility] please come in thru the Main Entrance and check in at the main information desk  You need to re-schedule your appointment should you arrive 10 or more minutes late.  We strive to give you quality time with our providers, and arriving late affects you and other patients whose appointments are after yours.  Also, if you no show three or more times for appointments you may be dismissed from the clinic at the providers discretion.     Again, thank you for choosing [facility].  Our hope is that these requests will decrease the amount of time that you wait before being seen by our physicians.       _____________________________________________________________  Should you have questions after your visit to [facility], please contact our office at [phone] between the hours of 8:00 a.m. and 4:30 p.m.  Voicemails left after 4:00 p.m. will not be returned until the following business day.  For prescription refill requests, have your pharmacy contact our office and allow 72 hours.    Cancer Center Support Programs:   > Cancer Support Group  2nd Tuesday of the month 1pm-2pm, Journey Room

## 2021-01-25 ENCOUNTER — Other Ambulatory Visit (HOSPITAL_COMMUNITY): Payer: Self-pay

## 2021-01-26 ENCOUNTER — Other Ambulatory Visit (HOSPITAL_COMMUNITY): Payer: Self-pay

## 2021-01-26 ENCOUNTER — Other Ambulatory Visit (HOSPITAL_COMMUNITY): Payer: Self-pay | Admitting: Hematology

## 2021-01-26 MED ORDER — ZEJULA 100 MG PO CAPS
ORAL_CAPSULE | ORAL | 3 refills | Status: DC
  Filled 2021-01-26: qty 90, 30d supply, fill #0
  Filled 2021-02-23: qty 90, 90d supply, fill #1
  Filled 2021-03-02: qty 90, 30d supply, fill #1
  Filled 2021-03-19: qty 90, 30d supply, fill #2
  Filled 2021-04-23: qty 90, 30d supply, fill #3

## 2021-01-27 ENCOUNTER — Other Ambulatory Visit (HOSPITAL_COMMUNITY): Payer: Self-pay

## 2021-01-28 ENCOUNTER — Other Ambulatory Visit (HOSPITAL_COMMUNITY): Payer: Self-pay

## 2021-01-29 ENCOUNTER — Other Ambulatory Visit (HOSPITAL_COMMUNITY): Payer: Self-pay

## 2021-02-22 ENCOUNTER — Other Ambulatory Visit (HOSPITAL_COMMUNITY): Payer: Self-pay

## 2021-02-23 ENCOUNTER — Other Ambulatory Visit (HOSPITAL_COMMUNITY): Payer: Self-pay

## 2021-03-02 ENCOUNTER — Other Ambulatory Visit (HOSPITAL_COMMUNITY): Payer: Self-pay

## 2021-03-03 ENCOUNTER — Other Ambulatory Visit (HOSPITAL_COMMUNITY): Payer: Self-pay

## 2021-03-19 ENCOUNTER — Other Ambulatory Visit (HOSPITAL_COMMUNITY): Payer: Self-pay

## 2021-03-23 ENCOUNTER — Other Ambulatory Visit (HOSPITAL_COMMUNITY): Payer: Self-pay

## 2021-03-24 ENCOUNTER — Other Ambulatory Visit (HOSPITAL_COMMUNITY): Payer: Self-pay

## 2021-03-30 ENCOUNTER — Other Ambulatory Visit (HOSPITAL_COMMUNITY): Payer: Self-pay | Admitting: Hematology

## 2021-03-30 DIAGNOSIS — C562 Malignant neoplasm of left ovary: Secondary | ICD-10-CM

## 2021-04-08 ENCOUNTER — Other Ambulatory Visit: Payer: Self-pay

## 2021-04-08 ENCOUNTER — Inpatient Hospital Stay (HOSPITAL_COMMUNITY): Payer: Medicare Other | Attending: Hematology

## 2021-04-08 DIAGNOSIS — R7989 Other specified abnormal findings of blood chemistry: Secondary | ICD-10-CM | POA: Diagnosis not present

## 2021-04-08 DIAGNOSIS — Z79899 Other long term (current) drug therapy: Secondary | ICD-10-CM | POA: Insufficient documentation

## 2021-04-08 DIAGNOSIS — Z90710 Acquired absence of both cervix and uterus: Secondary | ICD-10-CM | POA: Insufficient documentation

## 2021-04-08 DIAGNOSIS — C562 Malignant neoplasm of left ovary: Secondary | ICD-10-CM

## 2021-04-08 DIAGNOSIS — C563 Malignant neoplasm of bilateral ovaries: Secondary | ICD-10-CM | POA: Insufficient documentation

## 2021-04-08 DIAGNOSIS — Z9221 Personal history of antineoplastic chemotherapy: Secondary | ICD-10-CM | POA: Insufficient documentation

## 2021-04-08 LAB — CBC WITH DIFFERENTIAL/PLATELET
Abs Immature Granulocytes: 0.01 10*3/uL (ref 0.00–0.07)
Basophils Absolute: 0.1 10*3/uL (ref 0.0–0.1)
Basophils Relative: 1 %
Eosinophils Absolute: 0.4 10*3/uL (ref 0.0–0.5)
Eosinophils Relative: 7 %
HCT: 33.1 % — ABNORMAL LOW (ref 36.0–46.0)
Hemoglobin: 10.7 g/dL — ABNORMAL LOW (ref 12.0–15.0)
Immature Granulocytes: 0 %
Lymphocytes Relative: 21 %
Lymphs Abs: 1.1 10*3/uL (ref 0.7–4.0)
MCH: 35.8 pg — ABNORMAL HIGH (ref 26.0–34.0)
MCHC: 32.3 g/dL (ref 30.0–36.0)
MCV: 110.7 fL — ABNORMAL HIGH (ref 80.0–100.0)
Monocytes Absolute: 0.8 10*3/uL (ref 0.1–1.0)
Monocytes Relative: 16 %
Neutro Abs: 2.8 10*3/uL (ref 1.7–7.7)
Neutrophils Relative %: 55 %
Platelets: 279 10*3/uL (ref 150–400)
RBC: 2.99 MIL/uL — ABNORMAL LOW (ref 3.87–5.11)
RDW: 13.5 % (ref 11.5–15.5)
WBC: 5.2 10*3/uL (ref 4.0–10.5)
nRBC: 0 % (ref 0.0–0.2)

## 2021-04-08 LAB — COMPREHENSIVE METABOLIC PANEL
ALT: 31 U/L (ref 0–44)
AST: 34 U/L (ref 15–41)
Albumin: 4.2 g/dL (ref 3.5–5.0)
Alkaline Phosphatase: 80 U/L (ref 38–126)
Anion gap: 8 (ref 5–15)
BUN: 11 mg/dL (ref 8–23)
CO2: 28 mmol/L (ref 22–32)
Calcium: 9.2 mg/dL (ref 8.9–10.3)
Chloride: 103 mmol/L (ref 98–111)
Creatinine, Ser: 1.19 mg/dL — ABNORMAL HIGH (ref 0.44–1.00)
GFR, Estimated: 47 mL/min — ABNORMAL LOW (ref >60)
Glucose, Bld: 91 mg/dL (ref 70–99)
Potassium: 4.5 mmol/L (ref 3.5–5.1)
Sodium: 139 mmol/L (ref 135–145)
Total Bilirubin: 0.4 mg/dL (ref 0.3–1.2)
Total Protein: 7.4 g/dL (ref 6.5–8.1)

## 2021-04-08 LAB — MAGNESIUM: Magnesium: 2.2 mg/dL (ref 1.7–2.4)

## 2021-04-09 LAB — CA 125: Cancer Antigen (CA) 125: 7.5 U/mL (ref 0.0–38.1)

## 2021-04-15 ENCOUNTER — Other Ambulatory Visit: Payer: Self-pay

## 2021-04-15 ENCOUNTER — Inpatient Hospital Stay (HOSPITAL_BASED_OUTPATIENT_CLINIC_OR_DEPARTMENT_OTHER): Payer: Medicare Other | Admitting: Nurse Practitioner

## 2021-04-15 VITALS — BP 163/69 | HR 73 | Temp 96.8°F | Resp 18 | Wt 162.9 lb

## 2021-04-15 DIAGNOSIS — C562 Malignant neoplasm of left ovary: Secondary | ICD-10-CM | POA: Diagnosis not present

## 2021-04-15 DIAGNOSIS — C563 Malignant neoplasm of bilateral ovaries: Secondary | ICD-10-CM | POA: Diagnosis not present

## 2021-04-15 NOTE — Progress Notes (Signed)
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 3 cycles neoadjuvant carbo-taxol 05/19/15 to 07/01/15 2. Interval debulking to RO 07/31/15 3. 3 cycles of adjuvant carbo-taxol completed 12/2015.  4. Progression December 2018; 6 cycles of carbo-taxol completed 11/30/2017.  5. Started niraparib maintenance March 2019.   NGS Results: not done  Genetic Testing: germline: MSH6 c.2667G>T VUS, now reclassified as likely benign  CURRENT THERAPY: Zejula 300 mg daily  BRIEF ONCOLOGIC HISTORY:  Oncology History  Ovarian cancer ([place])  04/25/2015 - 04/28/2015 Hospital Admission   Nausea/diarrhea.  Oncology consult completed on 04/27/2015.    04/25/2015 Tumor Marker   CA 125- 3902.      CEA WNL    04/25/2015 Imaging   CT Abd/pelvis- Significant ascites with multiple peritoneal based soft tissue masses within the pelvis likely representing ovarian cancer with peritoneal carcinomatosis.    04/27/2015 Procedure   US Paracentesis- A total of approximately 3700 mL of amber colored fluid was removed. A fluid sample was sent for laboratory analysis.    04/27/2015 Imaging   CT Chest- Mild left supraclavicular lymphadenopathy and moderate mediastinal lymphadenopathy involving the prevascular, subcarinal and bilateral pericardiophrenic nodal chains, likely metastatic.    04/27/2015 Pathology Results   Diagnosis PERITONEAL/ASCITIC FLUID(SPECIMEN 1 OF 1 COLLECTED 04/27/15): MALIGNANT CELLS CONSISTENT WITH METASTATIC ADENOCARCINOMA.  The immunophenotype is most consistent with a gynecologic primary, most likely ovary.    05/08/2015 Miscellaneous   Seen by Dr. Denman George- recommending Carboplatin/Paclitaxel x 3 cycles and return visit to see her (within 1 week of administration of third cycle)  to evaluate for optimal sequencing of treatment modalities.    05/13/2015 - 05/15/2015 Hospital Admission   Hospitalized for AKI    05/19/2015 - 07/01/2015 Chemotherapy   Carboplatin/Paclitaxel x 3 cycles    07/28/2015 Surgery   Exploratory laparotomy with total abdominal hysterectomy, bilateral salpingo-oophorectomy, omentectomy radical tumor debulking for ovarian cancer; by Dr. Everitt Amber.    07/28/2015 Pathology Results   Uterus +/- tubes/ovaries, neoplastic, cervix - HIGH GRADE SEROUS CARCINOMA INVOLVING LEFT OVARY, LEFT FALLOPIAN TUBE AND RIGHT FALLOPIAN TUBE. - CERVIX, ENDOMETRIUM AND MYOMETRIUM ARE FREE OF TUMOR. 2. Cul-de-sac biop...    08/26/2015 -  Chemotherapy   Carboplatin/Paclitaxel x 3 cycles    12/14/2015 Genetic Testing   MSH6 c.2667G>T VUS found on the Breast/Ovarian cancer panel.  The Breast/Ovarian gene panel offered by [facility] includes sequencing and rearrangement analysis for the following 20 genes:  ATM, BARD1, BRCA1, BRCA2, BRIP1, CDH1, CHEK2, EPCAM, FANCC, MLH1, MSH2, MSH6, NBN, PALB2, PMS2, PTEN, RAD51C, RAD51D, TP53, and XRCC2.   The report date is 12/14/2015.  Update:  MSH6 c.2667G>T VUS has been reclassified from a variant of uncertain significance to a likely benign variant based on a combination of sources, e.g., internal data, published literature, population databases and in [place].  The updated report date is October 29, 2019.   12/23/2015 Miscellaneous   Genetic counseling.  Genetic testing was normal, and did not reveal a deleterious mutation in these genes    03/07/2017 Imaging   CT C/A/P IMPRESSION: Chest Impression:   No evidence of metastatic disease in thorax.   Abdomen / Pelvis Impression:   1. Interval increase in size of solitary nodular peritoneal implant in the LEFT upper quadrant .  2. No additional evidence of peritoneal nodularity. 3. No ascites.   07/31/2017 Progression   CT C/A/P: IMPRESSION: 1. Left upper quadrant nodule  continues to progress, now measuring 20 x 26 mm. 2. New nodule identified between the in adrenal gland, concerning for metastatic deposit. 3. Interval development of tiny nodules in the left lower quadrant mesenteric, concerning for metastases. 4. No ascites. 5. Status post total abdominal hysterectomy and omentectomy.      CANCER STAGING: Cancer Staging Ovarian cancer [facility] [place]) Staging form: Ovary, AJCC 7th Edition - Clinical stage from 04/27/2015: FIGO Stage IVB, calculated as Stage IV (T3c, N1, M1) - Signed by Baird Cancer, PA-C on 09/16/2015 - Pathologic stage from 05/08/2015: FIGO Stage IVB, calculated as Stage IV (TX, N1, M1) - Signed by Everitt Amber, MD on 05/08/2015 - Pathologic stage from 07/28/2015: FIGO Stage IV (T3c, NX, M1) - Signed by Baird Cancer, PA-C on 09/16/2015   INTERVAL HISTORY:  [name], a 80 y.o. female, with recurrent ovarian cancer s/p carbo-taxol, currently on maintenance zejula since 2019 who returns to clinic for continued follow up. She continues to feel great. Never misses a dose of zejula. Tolerating well.  She denies fever and chills.  Denies recent infections or hospitalizations.  Denies any bleeding or bruising.  No melena or hematochezia.  No changes in her bowel movements.  Appetite is good and she denies weight loss.  No chest pain or shortness of breath.  No nausea, vomiting.  No urinary complaints.  No vaginal bleeding.  No leg swelling.  No further specific complaints today.   REVIEW OF SYSTEMS:  Review of Systems  Constitutional:  Negative for appetite change, chills, fatigue and unexpected weight change.  HENT:   Negative for mouth sores, sore throat and trouble swallowing.   Respiratory:  Negative for chest tightness, cough and shortness of breath.   Cardiovascular:  Negative for leg swelling.  Gastrointestinal:  Negative for abdominal distention, abdominal pain, blood in stool, constipation, diarrhea, nausea, rectal pain and  vomiting.  Genitourinary:  Negative for bladder incontinence, dysuria, frequency, hematuria, pelvic pain, vaginal bleeding and vaginal discharge.   Musculoskeletal:  Negative for arthralgias, flank pain, myalgias and neck stiffness.  Skin:  Negative for itching, rash and wound.  Neurological:  Negative for dizziness, headaches, light-headedness and numbness.  Hematological:  Negative for adenopathy.  Psychiatric/Behavioral:  Negative for confusion, depression and sleep disturbance. The patient is not nervous/anxious.    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[place])    Vitamin D deficiency    Past Surgical History:  Procedure Laterality Date   ABDOMINAL HYSTERECTOMY N/A 07/28/2015   Procedure: TOTAL HYSTERECTOMY ABDOMINAL BILATERAL SALPINGO OOPHRORECTOMY;  Surgeon: Everitt Amber, MD;  Location: [facility];  Service: Gynecology;  Laterality: N/A;   DEBULKING N/A 07/28/2015   Procedure: DEBULKING;  Surgeon: Everitt Amber, MD;  Location: [facility];  Service: Gynecology;  Laterality: N/A;   LAPAROTOMY N/A 07/28/2015   Procedure: EXPLORATORY LAPAROTOMY;  Surgeon: Everitt Amber, MD;  Location: [facility];  Service: Gynecology;  Laterality: N/A;   OMENTECTOMY N/A 07/28/2015   Procedure: OMENTECTOMY ;  Surgeon: Everitt Amber, MD;  Location: [facility];  Service: Gynecology;  Laterality: N/A;   PARACENTESIS  04/27/15   PORTACATH PLACEMENT Right 04/2015   REPLACEMENT TOTAL KNEE     right knee in 2003    SOCIAL HISTORY:  Social History   Socioeconomic History   Marital status: Single    Spouse name: Not on file   Number of children: 1   Years of education: Not on file   Highest education level: Not on file  Occupational  History   Not on file  Tobacco Use   Smoking status: Never   Smokeless tobacco: Never  Vaping Use   Vaping Use: Never used  Substance and Sexual Activity   Alcohol use: No   Drug use: No   Sexual activity: Not Currently  Other Topics Concern   Not on file  Social History Narrative   Not on file   Social Determinants of Health   Financial Resource Strain: Not on file  Food Insecurity: Not on file  Transportation Needs: Not on file  Physical Activity: Not on file  Stress: Not on file  Social Connections: Not on file  Intimate Partner Violence: Not on file    FAMILY HISTORY:  Family History  Problem Relation Age of Onset   Prostate cancer Brother    Cancer Paternal Uncle        1 uncle with cancer NOS   Lung cancer Maternal Grandmother    Hypertension Maternal Grandfather    Congestive Heart Failure Mother    Seizures Sister     CURRENT MEDICATIONS:  Current Outpatient Medications  Medication Sig Dispense Refill   acetaminophen (TYLENOL) 500 MG tablet Take 2 tablets (1,000 mg total) by mouth every 12 (twelve) hours. 30 tablet 0   amLODipine (NORVASC) 10 MG tablet Take 10 mg by mouth every evening.      aspirin EC 81 MG tablet Take 81 mg by mouth daily.     cholecalciferol (VITAMIN D) 25 MCG (1000 UNIT) tablet Take 1,000 Units by mouth daily.     docusate sodium (COLACE) 100 MG capsule Take 100 mg by mouth 2 (two) times daily.     fluticasone (FLONASE) 50 MCG/ACT nasal spray 1 spray in each nostril     folic acid (FOLVITE) 1 MG tablet Take 1 mg by mouth daily.     lisinopril (PRINIVIL,ZESTRIL) 40 MG tablet Take 40 mg by mouth daily.   2   loratadine (CLARITIN) 10 MG tablet Take 10 mg by mouth daily.     lovastatin (MEVACOR) 10 MG tablet Take 10 mg by mouth at bedtime.     melatonin 5 MG TABS Take 5 mg by mouth at bedtime.     metoprolol tartrate (LOPRESSOR) 25 MG tablet Take 1 tablet (25 mg total) by mouth 2 (two) times daily. 60 tablet 6   mirtazapine (REMERON) 7.5  MG tablet Take 7.5 mg by mouth at bedtime.     Multiple Vitamin (MULTIVITAMIN WITH MINERALS) TABS tablet Take 1 tablet by mouth daily.     niraparib tosylate (ZEJULA) 100 MG capsule TAKE 3 CAPSULES (300 MG) BY MOUTH DAILY. 90 capsule 3   QUEtiapine (SEROQUEL XR) 300 MG 24 hr tablet Take 300 mg by mouth at bedtime.     sulfaSALAzine (AZULFIDINE) 500 MG tablet Take 500 mg by mouth 3 (three) times daily.     vitamin B-12 (CYANOCOBALAMIN) 1000 MCG tablet TAKE 1 TABLET BY MOUTH ONCE DAILY. 30 tablet 11   ondansetron (ZOFRAN)  8 MG tablet Take 1 tablet (8 mg total) by mouth 2 (two) times daily as needed for refractory nausea / vomiting. Start on day 3 after chemo. (Patient not taking: Reported on 04/15/2021) 30 tablet 1   prochlorperazine (COMPAZINE) 10 MG tablet Take 1 tablet (10 mg total) by mouth every 6 (six) hours as needed (Nausea or vomiting). (Patient not taking: Reported on 04/15/2021) 60 tablet 3   traMADol (ULTRAM) 50 MG tablet TAKE (1) TABLET BY MOUTH EVERY SIX HOURS AS NEEDED FOR PAIN. (Patient not taking: Reported on 04/15/2021) 90 tablet 0   No current facility-administered medications for this visit.    ALLERGIES:  No Known Allergies  PHYSICAL EXAM:  Performance status (ECOG): 1 - Symptomatic but completely ambulatory  Vitals:   04/15/21 1121  BP: (!) 163/69  Pulse: 73  Resp: 18  Temp: (!) 96.8 F (36 C)  SpO2: 100%   Wt Readings from Last 3 Encounters:  04/15/21 162 lb 14.7 oz (73.9 kg)  01/13/21 163 lb 9.3 oz (74.2 kg)  01/12/21 165 lb (74.8 kg)   Physical Exam Vitals reviewed.  Constitutional:      Appearance: Normal appearance.  Cardiovascular:     Rate and Rhythm: Normal rate and regular rhythm.     Pulses: Normal pulses.     Heart sounds: Normal heart sounds.  Pulmonary:     Effort: Pulmonary effort is normal.     Breath sounds: Normal breath sounds.  Abdominal:     Palpations: Abdomen is soft. There is no hepatomegaly, splenomegaly or mass.     Tenderness:  There is no abdominal tenderness.     Hernia: No hernia is present.  Musculoskeletal:     Right lower leg: No edema.     Left lower leg: No edema.  Skin:    Findings: No rash.  Neurological:     General: No focal deficit present.     Mental Status: She is alert and oriented to person, place, and time.  Psychiatric:        Mood and Affect: Mood normal.        Behavior: Behavior normal.     LABORATORY DATA:  I have reviewed the labs as listed.  CBC Latest Ref Rng & Units 04/08/2021 01/08/2021 10/01/2020  WBC 4.0 - 10.5 K/uL 5.2 5.9 4.5  Hemoglobin 12.0 - 15.0 g/dL 10.7(L) 10.6(L) 10.6(L)  Hematocrit 36.0 - 46.0 % 33.1(L) 32.6(L) 32.3(L)  Platelets 150 - 400 K/uL 279 316 331   CMP Latest Ref Rng & Units 04/08/2021 01/08/2021 10/01/2020  Glucose 70 - 99 mg/dL 91 102(H) 90  BUN 8 - 23 mg/dL _0 Creatinine 0.44 - 1.00 mg/dL 1.19(H) 1.18(H) 1.15(H)  Sodium 135 - 145 mmol/L 139 139 138  Potassium 3.5 - 5.1 mmol/L 4.5 4.1 4.4  Chloride 98 - 111 mmol/L 103 101 104  CO2 22 - 32 mmol/L _1 Calcium 8.9 - 10.3 mg/dL 9.2 9.6 9.5  Total Protein 6.5 - 8.1 g/dL 7.4 7.5 7.3  Total Bilirubin 0.3 - 1.2 mg/dL 0.4 0.6 0.4  Alkaline Phos 38 - 126 U/L 80 79 77  AST 15 - 41 U/L 34 32 30  ALT 0 - 44 U/L _2 DIAGNOSTIC IMAGING:  I have independently reviewed the scans and discussed with the patient. No results found.    ASSESSMENT & PLAN:  1.  Stage IVb ovarian cancer: 3 cycles of neoadjuvant carbo-taxol, optimally debulked followed by 3  cycles of adjuvant chemo completed 10/07/2015. Recurrence in 2018. Now s/p 6 cycles of carbo-taxol currently on maintenance niraparib since April 2019. Germline testing of negative. No somatic or NGS testing on file. Will defer to Dr. Denman George. CA 125 was 4586 at diagnosis so good marker for her. Currently low and normal at 7.5. Clinically asymptomatic. Recommend continuing niraparib. Again reviewed that treatment given with palliative intent. Will hold on  on imaging unless symptomatic or rising ca 125. Recommend seeing gyn-onc for pelvic exam in 3 months and medical oncology in 6 months. Repeat CA125 every 3 months or sooner if concerning symptoms.   2.  Elevated creatinine-creatinine 1.19.  Stable.  Likely related to niraparib.  Encouraged fluids.  3.  Back pain-resolved  Disposition: Patient follow-up with Dr. Denman George in 3 months with CA125 and pelvic exam RTC in 6 months for labs (cbc, cmp, ca125), MD   Orders placed this encounter:  Orders Placed This Encounter  Procedures   CA 125   CBC with Differential/Platelet   Comprehensive metabolic panel   Patient understands that if she develops concerning symptoms to notify clinic so that she can be seen sooner.  Beckey Rutter, DNP, AGNP-C

## 2021-04-19 ENCOUNTER — Other Ambulatory Visit (HOSPITAL_COMMUNITY): Payer: Self-pay

## 2021-04-21 ENCOUNTER — Other Ambulatory Visit (HOSPITAL_COMMUNITY): Payer: Self-pay

## 2021-04-23 ENCOUNTER — Other Ambulatory Visit (HOSPITAL_COMMUNITY): Payer: Self-pay

## 2021-04-28 ENCOUNTER — Other Ambulatory Visit (HOSPITAL_COMMUNITY): Payer: Self-pay

## 2021-05-18 ENCOUNTER — Other Ambulatory Visit (HOSPITAL_COMMUNITY): Payer: Self-pay

## 2021-05-20 ENCOUNTER — Other Ambulatory Visit (HOSPITAL_COMMUNITY): Payer: Self-pay | Admitting: Hematology

## 2021-05-20 ENCOUNTER — Other Ambulatory Visit (HOSPITAL_COMMUNITY): Payer: Self-pay

## 2021-05-20 MED ORDER — ZEJULA 100 MG PO CAPS
ORAL_CAPSULE | ORAL | 3 refills | Status: DC
  Filled 2021-05-20: qty 90, 30d supply, fill #0
  Filled 2021-06-24: qty 90, 30d supply, fill #1
  Filled 2021-07-22: qty 90, 30d supply, fill #2
  Filled 2021-08-24: qty 90, 30d supply, fill #3

## 2021-05-21 ENCOUNTER — Other Ambulatory Visit (HOSPITAL_COMMUNITY): Payer: Self-pay

## 2021-05-27 ENCOUNTER — Other Ambulatory Visit (HOSPITAL_COMMUNITY): Payer: Self-pay

## 2021-05-28 ENCOUNTER — Telehealth: Payer: Self-pay | Admitting: *Deleted

## 2021-05-28 NOTE — Telephone Encounter (Signed)
Called and moved the patient's appt from 9/28 to 9/12

## 2021-05-30 NOTE — Progress Notes (Signed)
Followup Note: Gyn-Onc  Latasha [name] 80 y.o. female with stage IV ovarian cancer s/p 3 cycles of chemotherapy  CC:  Chief Complaint  Patient presents with   Malignant neoplasm of ovary, unspecified laterality [facility])     Assessment/Plan:  Latasha [name]  is a 80 y.o.  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 BRCA negative on testing.  S/p salvage 2nd line chemotherapy with carb/taxol completed 11/30/17. Now on Niraparib since March, 2019.  No measurable disease.  Tolerating maintenance PARPi with Zejula. Recommend continuing this indefinitely or up to five years (2024) as she is tolerating treatment well and is disease free.  My partner, Dr Berline Lopes, will see Latasha [name] back in 6 months for a pelvic exam as part of surveillance as I am leaving the practice this month.   I recommend annual CT imaging while on Zejula and while CA 125 remains normal and stable. If there is substantial change in CA 125 or new symptoms of progression, would repeat imaging sooner.     HPI: Latasha [name] is a very pleasant 80 year old G1 who was originally seen in consultation at the request of Dr Ancil Linsey for (clinical) stage IVB ovarian cancer. The patient developed symptoms of progressive abdominal fullness and discomfort over the course of several months. In August, 2016 she presented to the [facility] where imaging of the abdomen and pelvis was obtained for diarrhea, nausea and emesis. Imaging on 04/25/15 revealed large volume ascites, carcinomatosis and peritoneal masses measuring up to 5.1cm. The patient underwent paracentesis with cytology on 04/27/15 which revealed metastatic adenocarcinoma consistent on immunostains with gyn primary.  CT of the chest on 04/27/15 revealed mild left supraclavicular lymphadenopathy, and mediastinal lymphadenopathy consistent with metastatic disease. There were <70m pulmonary nodules also seen which were indeterminant. There were  trace pleural effusions seen.   CA 125 on 04/25/15: 3902.  She then went on to receive 3 cycles of paclitaxel and carboplatin chemotherapy with Dr PWhitney Muse Cycle 3 was on 07/01/15.  CA 125 on 07/01/15 was 249.  CT scan on 07/20/15 showed: Previously noted ascites has resolved. No pneumoperitoneum. Many of the previously noted peritoneal implants are no longer confidently identified on today's examination. There are few smaller implants noted, the largest of which is in th  left side of the abdomen anteriorly measuring 9 x 11 mm. There is also a 10 x 7 mm lesion superficial to the splenic flexure of the colon which is substantially smaller than the prior study (previously 2.8 x 1.7 cm).  There was resolution of the chest adenopathy.  She has tolerated chemotherapy well with a good appetites and good general function.  On 07/31/15 she was taken to the OR for an ex lap, TAH, BSO, omentectomy, radical tumor debulking. Intraoperative findings included: Excellent response to neoadjuvant chemotherapy. Small volume plaques/nodules in left and right posterior cul de sac behind uterus, tumor on left ovary (3cm), multiple 2cm nodules in omentum. No other peritoneal disease. Diaphragm normal.    She had an optimal/complete cytoreduction (R0) with no gross visible disease remaining.  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  Postoperatively she did very well with no postoperative complications.  She went on to receive 3 additional adjuvant cycles of carboplatin and paclitaxel completed April, 2017. She tolerated therapy very well.  Post treatment scans were unremarkable and showed no residual disease.  Her CA 125 was monitored at 3 monthly intervals. In January,  2018 it was normal at 17. On April, 20th ,2018 it had increased to 32. On June 15th, 2018 it had increased again to 47. CT chest/abdo/pelvis on 03/07/17 showed solitary pertoneal nodule in the gastrosplenic  ligament measuring 45m x 147m(increased from a prior scan where it was 1cm). No additional nodularity is noted nor ascites.  She was offered secondary cytoreduction surgery followed by chemotherapy, however she declined this and opted for expectant management because she felt good.  On 07/31/17 repeat CT abdo/pelvis was performed. It showed progression of peritoneal disease with an enlargement of a peri-splenic lesion, a new nodule adjacent to the adrenal gland and a new left lower quadrant peritoneal metastases.  She agreed to commend salvage 2nd line chemotherapy with carboplatin and paclitaxel in December, 2018 due to the onset of symptomatic recurrence. She completed 6 cycles of this on 11/30/17 and was then changed to Niraparib maintenance 30091maily starting in March, 2019.  CA 125 was normal at 9 on 03/26/18.  CT scan abd/pelvis in July, 2019 was normal with no evidence of recurrent/progressive disease.  CT scan in December, 2019 was stable with no progression.  CA 125 on 11/15/18 was normal and stable at 6.7. CT scan abd/pelvis on 11/15/18 showed no new progression or recurrence.   CA 125 on 01/22/19 was normal at 6. CA 125 on 03/11/19 was normal at 6.7. CA 125 on 05/07/19 was normal at 6.9. CA 125  On 10/15/19 was normal at 6.2.  CT abd/pelvis on 07/12/19 showed no evidence of recurrent/progressive disease.  CA 125 on 12/17/19 was normal at 6.7 CT chest/abd/pelvis on 12/17/19 showed no evidence of recurrent disease.  CA 125 on 03/24/20 was normal at 6.2. CA 125 on 10/01/20 was normal at 6.4 CA 125 on 01/08/21 was normal at 6.3.  CT scan on 01/08/21 was normal and showed no evidence of recurrence.   Interval Hx:  CA 125 on 04/08/21 was normal at 7.5.  She continues to feel well with no abdominal symptoms and an excellent performance status (ECOG 0).  She denies thrombocytopenia. She has mild occasional nausea.   Current Meds:  Outpatient Encounter Medications as of 05/31/2021   Medication Sig   acetaminophen (TYLENOL) 500 MG tablet Take 2 tablets (1,000 mg total) by mouth every 12 (twelve) hours.   amLODipine (NORVASC) 10 MG tablet Take 10 mg by mouth every evening.    aspirin EC 81 MG tablet Take 81 mg by mouth daily.   cholecalciferol (VITAMIN D) 25 MCG (1000 UNIT) tablet Take 1,000 Units by mouth daily.   docusate sodium (COLACE) 100 MG capsule Take 100 mg by mouth 2 (two) times daily.   fluticasone (FLONASE) 50 MCG/ACT nasal spray 1 spray in each nostril   folic acid (FOLVITE) 1 MG tablet Take 1 mg by mouth daily.   lisinopril (PRINIVIL,ZESTRIL) 40 MG tablet Take 40 mg by mouth daily.    loratadine (CLARITIN) 10 MG tablet Take 10 mg by mouth daily.   lovastatin (MEVACOR) 10 MG tablet Take 10 mg by mouth at bedtime.   melatonin 5 MG TABS Take 5 mg by mouth at bedtime.   metoprolol tartrate (LOPRESSOR) 25 MG tablet Take 1 tablet (25 mg total) by mouth 2 (two) times daily.   mirtazapine (REMERON) 7.5 MG tablet Take 7.5 mg by mouth at bedtime.   Multiple Vitamin (MULTIVITAMIN WITH MINERALS) TABS tablet Take 1 tablet by mouth daily.   niraparib tosylate (ZEJULA) 100 MG capsule TAKE 3 CAPSULES (300 MG) BY  MOUTH DAILY.   ondansetron (ZOFRAN) 8 MG tablet Take 1 tablet (8 mg total) by mouth 2 (two) times daily as needed for refractory nausea / vomiting. Start on day 3 after chemo. (Patient not taking: Reported on 04/15/2021)   prochlorperazine (COMPAZINE) 10 MG tablet Take 1 tablet (10 mg total) by mouth every 6 (six) hours as needed (Nausea or vomiting). (Patient not taking: Reported on 04/15/2021)   QUEtiapine (SEROQUEL XR) 300 MG 24 hr tablet Take 300 mg by mouth at bedtime.   sulfaSALAzine (AZULFIDINE) 500 MG tablet Take 500 mg by mouth 3 (three) times daily.   traMADol (ULTRAM) 50 MG tablet TAKE (1) TABLET BY MOUTH EVERY SIX HOURS AS NEEDED FOR PAIN. (Patient not taking: Reported on 04/15/2021)   vitamin B-12 (CYANOCOBALAMIN) 1000 MCG tablet TAKE 1 TABLET BY MOUTH ONCE  DAILY.   No facility-administered encounter medications on file as of 05/31/2021.    Allergy: No Known Allergies  Social Hx:   Social History   Socioeconomic History   Marital status: Single    Spouse name: Not on file   Number of children: 1   Years of education: Not on file   Highest education level: Not on file  Occupational History   Not on file  Tobacco Use   Smoking status: Never   Smokeless tobacco: Never  Vaping Use   Vaping Use: Never used  Substance and Sexual Activity   Alcohol use: No   Drug use: No   Sexual activity: Not Currently  Other Topics Concern   Not on file  Social History Narrative   Not on file   Social Determinants of Health   Financial Resource Strain: Not on file  Food Insecurity: Not on file  Transportation Needs: Not on file  Physical Activity: Not on file  Stress: Not on file  Social Connections: Not on file  Intimate Partner Violence: Not on file    Past Surgical Hx:  Past Surgical History:  Procedure Laterality Date   ABDOMINAL HYSTERECTOMY N/A 07/28/2015   Procedure: TOTAL HYSTERECTOMY ABDOMINAL BILATERAL SALPINGO OOPHRORECTOMY;  Surgeon: Everitt Amber, MD;  Location: [facility];  Service: Gynecology;  Laterality: N/A;   DEBULKING N/A 07/28/2015   Procedure: DEBULKING;  Surgeon: Everitt Amber, MD;  Location: [facility];  Service: Gynecology;  Laterality: N/A;   LAPAROTOMY N/A 07/28/2015   Procedure: EXPLORATORY LAPAROTOMY;  Surgeon: Everitt Amber, MD;  Location: [facility];  Service: Gynecology;  Laterality: N/A;   OMENTECTOMY N/A 07/28/2015   Procedure: OMENTECTOMY ;  Surgeon: Everitt Amber, MD;  Location: [facility];  Service: Gynecology;  Laterality: N/A;   PARACENTESIS  04/27/15   PORTACATH PLACEMENT Right 04/2015   REPLACEMENT TOTAL KNEE     right knee in 2003    Past Medical Hx:  Past Medical History:  Diagnosis Date   Arthritis    B12 deficiency    Cataract    History of blood transfusion    History of chemotherapy    Hypertension    Iron  deficiency anemia    Mixed hyperlipidemia    Ovarian cancer ([place])    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 Patient has had a hysterectomy.  Family Hx:  Family History  Problem Relation Age of Onset   Prostate cancer Brother    Cancer Paternal Uncle  1 uncle with cancer NOS   Lung cancer Maternal Grandmother    Hypertension Maternal Grandfather    Congestive Heart Failure Mother    Seizures Sister     Review of Systems:  Constitutional  Feels well,    ENT Normal appearing ears and nares bilaterally Skin/Breast  No rash, sores, jaundice, itching, dryness Cardiovascular  No chest pain, shortness of breath, or edema  Pulmonary  No cough or wheeze.  Gastro Intestinal  No nausea, vomitting, or diarrhoea. No bright red blood per rectum, no abdominal pain, change in bowel movement, or constipation.  Genito Urinary  No frequency, urgency, dysuria, no postmenopausal bleeding Musculo Skeletal  No myalgia, arthralgia, joint swelling or pain  Neurologic  No weakness, numbness, change in gait,  Psychology    No depression, anxiety, insomnia.   Vitals:  Blood pressure (!) 166/70, pulse 74, temperature 98.1 F (36.7 C), temperature source Oral, resp. rate 18, height 5' 6"  (1.676 m), weight 172 lb (78 kg), SpO2 99 %.  Physical Exam: WD in NAD Neck  Supple NROM, without any enlargements.  Lymph Node Survey No cervical supraclavicular or inguinal adenopathy Cardiovascular  Pulse normal rate, regularity and rhythm. S1 and S2 normal.  Lungs  Clear to auscultation bilateraly, without wheezes/crackles/rhonchi. Good air movement.  Skin  No rash/lesions/breakdown  Psychiatry  Alert and oriented to person, place, and time  Abdomen  Normoactive bowel sounds, abdomen soft, non distended, non-tender and thin without evidence of hernia.  Incision well  healed. No palpable masses. Back No CVA tenderness Genito Urinary  Vaginal cuff normal with no lesions. In tact vagina.No bleeding. Surgically absent uterus and cervix. Extremities  No bilateral cyanosis, clubbing or edema.   Thereasa Solo, MD   05/31/2021, 12:28 PM

## 2021-05-31 ENCOUNTER — Other Ambulatory Visit: Payer: Self-pay

## 2021-05-31 ENCOUNTER — Encounter: Payer: Self-pay | Admitting: Gynecologic Oncology

## 2021-05-31 ENCOUNTER — Inpatient Hospital Stay: Payer: Medicare Other | Attending: Gynecologic Oncology | Admitting: Gynecologic Oncology

## 2021-05-31 VITALS — BP 166/70 | HR 74 | Temp 98.1°F | Resp 18 | Ht 66.0 in | Wt 172.0 lb

## 2021-05-31 DIAGNOSIS — Z90710 Acquired absence of both cervix and uterus: Secondary | ICD-10-CM | POA: Insufficient documentation

## 2021-05-31 DIAGNOSIS — C569 Malignant neoplasm of unspecified ovary: Secondary | ICD-10-CM | POA: Insufficient documentation

## 2021-05-31 DIAGNOSIS — Z79899 Other long term (current) drug therapy: Secondary | ICD-10-CM | POA: Insufficient documentation

## 2021-05-31 DIAGNOSIS — Z90722 Acquired absence of ovaries, bilateral: Secondary | ICD-10-CM | POA: Diagnosis not present

## 2021-05-31 NOTE — Patient Instructions (Signed)
Please notify Dr Denman George at phone number [phone] if you notice vaginal bleeding, new pelvic or abdominal pains, bloating, feeling full easy, or a change in bladder or bowel function.   Dr Denman George is departing the [place] at [facility] in October, 2022. Her partners and colleagues including Dr Berline Lopes, Dr Delsa Sale and Joylene John, Nurse Practitioner will be available to continue your care.   You are next scheduled to return to the Gynecologic Oncology office at the [facility] in March, 2023. Please have Dr Marthann Schiller office call [phone] in December/January after your appointment with him to request an appointment for Dr Berline Lopes (Dr Serita Grit partner) in the [place] in March, 2023.

## 2021-06-16 ENCOUNTER — Ambulatory Visit: Payer: Medicare Other | Admitting: Gynecologic Oncology

## 2021-06-17 ENCOUNTER — Other Ambulatory Visit (HOSPITAL_COMMUNITY): Payer: Self-pay

## 2021-06-24 ENCOUNTER — Other Ambulatory Visit (HOSPITAL_COMMUNITY): Payer: Self-pay

## 2021-06-29 ENCOUNTER — Other Ambulatory Visit (HOSPITAL_COMMUNITY): Payer: Self-pay

## 2021-07-21 ENCOUNTER — Other Ambulatory Visit (HOSPITAL_COMMUNITY): Payer: Self-pay

## 2021-07-22 ENCOUNTER — Other Ambulatory Visit (HOSPITAL_COMMUNITY): Payer: Self-pay

## 2021-07-29 ENCOUNTER — Other Ambulatory Visit (HOSPITAL_COMMUNITY): Payer: Self-pay

## 2021-08-09 ENCOUNTER — Other Ambulatory Visit (HOSPITAL_COMMUNITY): Payer: Self-pay | Admitting: *Deleted

## 2021-08-09 DIAGNOSIS — C562 Malignant neoplasm of left ovary: Secondary | ICD-10-CM

## 2021-08-09 MED ORDER — TRAMADOL HCL 50 MG PO TABS: ORAL_TABLET | ORAL | 0 refills | Status: DC

## 2021-08-11 ENCOUNTER — Other Ambulatory Visit (HOSPITAL_COMMUNITY): Payer: Self-pay

## 2021-08-11 DIAGNOSIS — C562 Malignant neoplasm of left ovary: Secondary | ICD-10-CM

## 2021-08-11 MED ORDER — TRAMADOL HCL 50 MG PO TABS: ORAL_TABLET | ORAL | 0 refills | Status: DC

## 2021-08-16 ENCOUNTER — Other Ambulatory Visit (HOSPITAL_COMMUNITY): Payer: Self-pay | Admitting: Hematology

## 2021-08-16 DIAGNOSIS — C562 Malignant neoplasm of left ovary: Secondary | ICD-10-CM

## 2021-08-24 ENCOUNTER — Other Ambulatory Visit (HOSPITAL_COMMUNITY): Payer: Self-pay

## 2021-08-26 ENCOUNTER — Other Ambulatory Visit (HOSPITAL_COMMUNITY): Payer: Self-pay

## 2021-09-21 ENCOUNTER — Other Ambulatory Visit (HOSPITAL_COMMUNITY): Payer: Self-pay

## 2021-09-21 ENCOUNTER — Other Ambulatory Visit (HOSPITAL_COMMUNITY): Payer: Self-pay | Admitting: Hematology

## 2021-09-22 ENCOUNTER — Other Ambulatory Visit (HOSPITAL_COMMUNITY): Payer: Self-pay

## 2021-09-22 MED ORDER — ZEJULA 100 MG PO CAPS
ORAL_CAPSULE | ORAL | 3 refills | Status: DC
  Filled 2021-09-22: qty 90, 30d supply, fill #0
  Filled 2021-10-18: qty 90, 30d supply, fill #1
  Filled 2021-11-23: qty 90, 30d supply, fill #2
  Filled 2021-12-23: qty 90, 30d supply, fill #3

## 2021-09-27 ENCOUNTER — Other Ambulatory Visit (HOSPITAL_COMMUNITY): Payer: Self-pay

## 2021-09-28 ENCOUNTER — Other Ambulatory Visit (HOSPITAL_COMMUNITY): Payer: Self-pay

## 2021-10-18 ENCOUNTER — Other Ambulatory Visit (HOSPITAL_COMMUNITY): Payer: Self-pay

## 2021-10-25 ENCOUNTER — Other Ambulatory Visit (HOSPITAL_COMMUNITY): Payer: Self-pay

## 2021-10-25 ENCOUNTER — Other Ambulatory Visit: Payer: Self-pay

## 2021-10-25 ENCOUNTER — Inpatient Hospital Stay (HOSPITAL_COMMUNITY): Payer: Medicare Other | Attending: Hematology

## 2021-10-25 DIAGNOSIS — C562 Malignant neoplasm of left ovary: Secondary | ICD-10-CM

## 2021-10-25 DIAGNOSIS — Z9221 Personal history of antineoplastic chemotherapy: Secondary | ICD-10-CM | POA: Insufficient documentation

## 2021-10-25 DIAGNOSIS — I10 Essential (primary) hypertension: Secondary | ICD-10-CM | POA: Insufficient documentation

## 2021-10-25 DIAGNOSIS — Z79899 Other long term (current) drug therapy: Secondary | ICD-10-CM | POA: Diagnosis not present

## 2021-10-25 DIAGNOSIS — C563 Malignant neoplasm of bilateral ovaries: Secondary | ICD-10-CM | POA: Diagnosis present

## 2021-10-25 LAB — CBC WITH DIFFERENTIAL/PLATELET
Abs Immature Granulocytes: 0.01 10*3/uL (ref 0.00–0.07)
Basophils Absolute: 0.1 10*3/uL (ref 0.0–0.1)
Basophils Relative: 1 %
Eosinophils Absolute: 0.4 10*3/uL (ref 0.0–0.5)
Eosinophils Relative: 7 %
HCT: 31.8 % — ABNORMAL LOW (ref 36.0–46.0)
Hemoglobin: 10.6 g/dL — ABNORMAL LOW (ref 12.0–15.0)
Immature Granulocytes: 0 %
Lymphocytes Relative: 26 %
Lymphs Abs: 1.5 10*3/uL (ref 0.7–4.0)
MCH: 36.9 pg — ABNORMAL HIGH (ref 26.0–34.0)
MCHC: 33.3 g/dL (ref 30.0–36.0)
MCV: 110.8 fL — ABNORMAL HIGH (ref 80.0–100.0)
Monocytes Absolute: 0.9 10*3/uL (ref 0.1–1.0)
Monocytes Relative: 16 %
Neutro Abs: 2.9 10*3/uL (ref 1.7–7.7)
Neutrophils Relative %: 50 %
Platelets: 285 10*3/uL (ref 150–400)
RBC: 2.87 MIL/uL — ABNORMAL LOW (ref 3.87–5.11)
RDW: 13.1 % (ref 11.5–15.5)
WBC: 5.7 10*3/uL (ref 4.0–10.5)
nRBC: 0.4 % — ABNORMAL HIGH (ref 0.0–0.2)

## 2021-10-25 LAB — COMPREHENSIVE METABOLIC PANEL
ALT: 28 U/L (ref 0–44)
AST: 33 U/L (ref 15–41)
Albumin: 4.4 g/dL (ref 3.5–5.0)
Alkaline Phosphatase: 87 U/L (ref 38–126)
Anion gap: 8 (ref 5–15)
BUN: 15 mg/dL (ref 8–23)
CO2: 27 mmol/L (ref 22–32)
Calcium: 9.2 mg/dL (ref 8.9–10.3)
Chloride: 103 mmol/L (ref 98–111)
Creatinine, Ser: 1.13 mg/dL — ABNORMAL HIGH (ref 0.44–1.00)
GFR, Estimated: 50 mL/min — ABNORMAL LOW (ref >60)
Glucose, Bld: 89 mg/dL (ref 70–99)
Potassium: 4 mmol/L (ref 3.5–5.1)
Sodium: 138 mmol/L (ref 135–145)
Total Bilirubin: 0.6 mg/dL (ref 0.3–1.2)
Total Protein: 7.2 g/dL (ref 6.5–8.1)

## 2021-10-26 LAB — CA 125: Cancer Antigen (CA) 125: 6.4 U/mL (ref 0.0–38.1)

## 2021-11-01 ENCOUNTER — Inpatient Hospital Stay (HOSPITAL_BASED_OUTPATIENT_CLINIC_OR_DEPARTMENT_OTHER): Payer: Medicare Other | Admitting: Hematology

## 2021-11-01 ENCOUNTER — Other Ambulatory Visit: Payer: Self-pay

## 2021-11-01 VITALS — BP 179/77 | HR 83 | Temp 98.6°F | Resp 18 | Ht 66.0 in | Wt 161.4 lb

## 2021-11-01 DIAGNOSIS — D529 Folate deficiency anemia, unspecified: Secondary | ICD-10-CM | POA: Diagnosis not present

## 2021-11-01 DIAGNOSIS — C562 Malignant neoplasm of left ovary: Secondary | ICD-10-CM

## 2021-11-01 DIAGNOSIS — C563 Malignant neoplasm of bilateral ovaries: Secondary | ICD-10-CM | POA: Diagnosis not present

## 2021-11-01 NOTE — Progress Notes (Signed)
Patient is taking Zejula as prescribed.  She has not missed any doses and reports no side effects at this time.

## 2021-11-01 NOTE — Progress Notes (Signed)
[address], [place] [address]   CLINIC:  Medical Oncology/Hematology  PCP:  Patient, No Pcp Per (Inactive) None None   REASON FOR VISIT:  Follow-up for left ovarian cancer  PRIOR THERAPY:  1. Carboplatin and paclitaxel x 3 cycles from 05/19/2015 to 07/01/2015, then 3 more cycles through 10/07/2015. Afterwards, x 6 cycles from 08/17/2017 to 11/30/2017. 2. Debulking surgery with TAH-BSO on 08/07/2015.  NGS Results: not done  CURRENT THERAPY: Zejula 300 mg daily  BRIEF ONCOLOGIC HISTORY:  Oncology History  Ovarian cancer ([place])  04/25/2015 - 04/28/2015 Hospital Admission   Nausea/diarrhea.  Oncology consult completed on 04/27/2015.   04/25/2015 Tumor Marker   CA 125- 3902.      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   04/27/2015 Procedure   US Paracentesis- A total of approximately 3700 mL of [name] colored fluid was removed. A fluid sample was sent for laboratory analysis.   04/27/2015 Imaging   CT Chest- Mild left supraclavicular lymphadenopathy and moderate mediastinal lymphadenopathy involving the prevascular, subcarinal and bilateral pericardiophrenic nodal chains, likely metastatic.   04/27/2015 Pathology Results   Diagnosis PERITONEAL/ASCITIC FLUID(SPECIMEN 1 OF 1 COLLECTED 04/27/15): MALIGNANT CELLS CONSISTENT WITH METASTATIC ADENOCARCINOMA.  The immunophenotype is most consistent with a gynecologic primary, most likely ovary.   05/08/2015 Miscellaneous   Seen by Dr. Denman [name]- recommending Carboplatin/Paclitaxel x 3 cycles and return visit to see her (within 1 week of administration of third cycle) to evaluate for optimal sequencing of treatment modalities.   05/13/2015 - 05/15/2015 Hospital Admission   Hospitalized for AKI   05/19/2015 - 07/01/2015 Chemotherapy   Carboplatin/Paclitaxel x 3  cycles   07/28/2015 Surgery   Exploratory laparotomy with total abdominal hysterectomy, bilateral salpingo-oophorectomy, omentectomy radical tumor debulking for ovarian cancer; by Dr. Everitt [name].   07/28/2015 Pathology Results   Uterus +/- tubes/ovaries, neoplastic, cervix - HIGH GRADE SEROUS CARCINOMA INVOLVING LEFT OVARY, LEFT FALLOPIAN TUBE AND RIGHT FALLOPIAN TUBE. - CERVIX, ENDOMETRIUM AND MYOMETRIUM ARE FREE OF TUMOR. 2. Cul-de-sac biop...   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  The Breast/Ovarian gene panel offered by [facility] includes sequencing and rearrangement analysis for the following 20 genes:  ATM, BARD1, BRCA1, BRCA2, BRIP1, CDH1, CHEK2, EPCAM, FANCC, MLH1, MSH2, MSH6, NBN, PALB2, PMS2, PTEN, RAD51C, RAD51D, TP53, and XRCC2.   The report date is 12/14/2015.  Update:  MSH6 c.2667G>T VUS has been reclassified from a variant of uncertain significance to a likely benign variant based on a combination of sources, e.g., internal data, published literature, population databases and in [place].  The updated report date is October 29, 2019.   12/23/2015 Miscellaneous   Genetic counseling.  Genetic testing was normal, and did not reveal a deleterious mutation in these genes   03/07/2017 Imaging   CT C/A/P IMPRESSION: Chest Impression:   No evidence of metastatic disease in thorax.   Abdomen / Pelvis Impression:   1. Interval increase in size of solitary nodular peritoneal implant in the LEFT upper quadrant . 2. No additional evidence of peritoneal nodularity. 3. No ascites.   07/31/2017 Progression   CT C/A/P: IMPRESSION: 1. Left upper quadrant nodule continues to progress, now measuring 20 x 26 mm. 2. New nodule identified between the in  adrenal gland, concerning for metastatic deposit. 3. Interval development of tiny nodules in the left lower  quadrant mesenteric, concerning for metastases. 4. No ascites. 5. Status post total abdominal hysterectomy and omentectomy.     CANCER STAGING:  Cancer Staging  Ovarian cancer [facility]) Staging form: Ovary, AJCC 7th Edition - Clinical stage from 04/27/2015: FIGO Stage IVB, calculated as Stage IV (T3c, N1, M1) - Signed by Baird Cancer, PA-C on 09/16/2015 - Pathologic stage from 05/08/2015: FIGO Stage IVB, calculated as Stage IV (TX, N1, M1) - Signed by Latasha Amber, MD on 05/08/2015 - Pathologic stage from 07/28/2015: FIGO Stage IV (T3c, NX, M1) - Signed by Baird Cancer, PA-C on 09/16/2015   INTERVAL HISTORY:  [name], a 81 y.o. female, returns for routine follow-up of her left ovarian cancer. Latasha [name] was last seen on 01/13/2021.   Today she reports feeling good. She denies n/v/d and abdominal pain. She takes tramadol prn for pain from arthritis, but she does not require it daily. Her appetite is good.   REVIEW OF SYSTEMS:  Review of Systems  Constitutional:  Negative for appetite change and fatigue.  Gastrointestinal:  Negative for abdominal pain, diarrhea, nausea and vomiting.  Musculoskeletal:  Positive for arthralgias (occasional).  All other systems reviewed and are negative.  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)    Schizophrenia ([place])    Ulcerative colitis ([place])    Vitamin D deficiency    Past Surgical History:  Procedure Laterality Date   ABDOMINAL HYSTERECTOMY N/A 07/28/2015   Procedure: TOTAL HYSTERECTOMY ABDOMINAL BILATERAL SALPINGO OOPHRORECTOMY;  Surgeon: Latasha Amber, MD;  Location: [facility];  Service: Gynecology;  Laterality: N/A;   DEBULKING N/A 07/28/2015   Procedure: DEBULKING;  Surgeon: Latasha Amber,  MD;  Location: [facility];  Service: Gynecology;  Laterality: N/A;   LAPAROTOMY N/A 07/28/2015   Procedure: EXPLORATORY LAPAROTOMY;  Surgeon: Latasha Amber, MD;  Location: [facility];  Service: Gynecology;  Laterality: N/A;   OMENTECTOMY N/A 07/28/2015   Procedure: OMENTECTOMY ;  Surgeon: Latasha Amber, MD;  Location: [facility];  Service: Gynecology;  Laterality: N/A;   PARACENTESIS  04/27/15   PORTACATH PLACEMENT Right 04/2015   REPLACEMENT TOTAL KNEE     right knee in 2003    SOCIAL HISTORY:  Social History   Socioeconomic History   Marital status: Single    Spouse name: Not on file   Number of children: 1   Years of education: Not on file   Highest education level: Not on file  Occupational History   Not on file  Tobacco Use   Smoking status: Never   Smokeless tobacco: Never  Vaping Use   Vaping Use: Never used  Substance and Sexual Activity   Alcohol use: No   Drug use: No   Sexual activity: Not Currently  Other Topics Concern   Not on file  Social History Narrative   Not on file   Social Determinants of Health   Financial Resource Strain: Not on file  Food Insecurity: Not on file  Transportation Needs: Not on file  Physical Activity: Not on file  Stress: Not on file  Social Connections: Not on file  Intimate Partner Violence: Not on file    FAMILY HISTORY:  Family History  Problem Relation Age of Onset   Prostate cancer Brother    Cancer Paternal Uncle        1 uncle with cancer NOS   Lung cancer Maternal Grandmother    Hypertension Maternal Grandfather    Congestive Heart Failure Mother    Seizures Sister     CURRENT MEDICATIONS:  Current Outpatient Medications  Medication Sig Dispense Refill   acetaminophen (TYLENOL) 500 MG tablet Take 2 tablets (1,000 mg total) by mouth every 12 (twelve) hours. 30 tablet 0   amLODipine (NORVASC) 10 MG tablet Take 10 mg by mouth every evening.      aspirin EC 81 MG tablet Take 81 mg by mouth daily.     cholecalciferol (VITAMIN D) 25  MCG (1000 UNIT) tablet Take 1,000 Units by mouth daily.     docusate sodium (COLACE) 100 MG capsule Take 100 mg by mouth 2 (two) times daily.     fluticasone (FLONASE) 50 MCG/ACT nasal spray 1 spray in each nostril     folic acid (FOLVITE) 1 MG tablet Take 1 mg by mouth daily.     lisinopril (PRINIVIL,ZESTRIL) 40 MG tablet Take 40 mg by mouth daily.   2   loratadine (CLARITIN) 10 MG tablet Take 10 mg by mouth daily.     lovastatin (MEVACOR) 10 MG tablet Take 10 mg by mouth at bedtime.     melatonin 5 MG TABS Take 5 mg by mouth at bedtime.     metoprolol tartrate (LOPRESSOR) 25 MG tablet Take 1 tablet (25 mg total) by mouth 2 (two) times daily. 60 tablet 6   mirtazapine (REMERON) 7.5 MG tablet Take 7.5 mg by mouth at bedtime.     Multiple Vitamin (MULTIVITAMIN WITH MINERALS) TABS tablet Take 1 tablet by mouth daily.     niraparib tosylate (ZEJULA) 100 MG capsule TAKE 3 CAPSULES (300 MG) BY MOUTH DAILY. 90 capsule 3   ondansetron (ZOFRAN) 8 MG tablet Take 1 tablet (8 mg total) by mouth 2 (two) times daily as needed for refractory nausea / vomiting. Start on day 3 after chemo. 30 tablet 1   prochlorperazine (COMPAZINE) 10 MG tablet Take 1 tablet (10 mg total) by mouth every 6 (six) hours as needed (Nausea or vomiting). 60 tablet 3   QUEtiapine (SEROQUEL XR) 300 MG 24 hr tablet Take 300 mg by mouth at bedtime.     sulfaSALAzine (AZULFIDINE) 500 MG tablet Take 500 mg by mouth 3 (three) times daily.     traMADol (ULTRAM) 50 MG tablet TAKE (1) TABLET BY MOUTH EVERY SIX HOURS AS NEEDED FOR PAIN. 90 tablet 0   vitamin B-12 (CYANOCOBALAMIN) 1000 MCG tablet TAKE 1 TABLET BY MOUTH ONCE DAILY. 30 tablet 11   No current facility-administered medications for this visit.    ALLERGIES:  No Known Allergies  PHYSICAL EXAM:  Performance status (ECOG): 1 - Symptomatic but completely ambulatory  Vitals:   11/01/21 1139  BP: (!) 179/77  Pulse: 83  Resp: 18  Temp: 98.6 F (37 C)  SpO2: 99%   Wt Readings  from Last 3 Encounters:  11/01/21 161 lb 6.4 oz (73.2 kg)  05/31/21 172 lb (78 kg)  04/15/21 162 lb 14.7 oz (73.9 kg)   Physical Exam Vitals reviewed.  Constitutional:      Appearance: Normal appearance.  Cardiovascular:     Rate and Rhythm: Normal rate and regular rhythm.  Pulses: Normal pulses.     Heart sounds: Normal heart sounds.  Pulmonary:     Effort: Pulmonary effort is normal.     Breath sounds: Normal breath sounds.  Abdominal:     Palpations: Abdomen is soft. There is no hepatomegaly, splenomegaly or mass.     Tenderness: There is no abdominal tenderness.  Musculoskeletal:     Right lower leg: No edema.     Left lower leg: No edema.  Neurological:     General: No focal deficit present.     Mental Status: She is alert and oriented to person, place, and time.  Psychiatric:        Mood and Affect: Mood normal.        Behavior: Behavior normal.     LABORATORY DATA:  I have reviewed the labs as listed.  CBC Latest Ref Rng & Units 10/25/2021 04/08/2021 01/08/2021  WBC 4.0 - 10.5 K/uL 5.7 5.2 5.9  Hemoglobin 12.0 - 15.0 g/dL 10.6(L) 10.7(L) 10.6(L)  Hematocrit 36.0 - 46.0 % 31.8(L) 33.1(L) 32.6(L)  Platelets 150 - 400 K/uL 285 279 316   CMP Latest Ref Rng & Units 10/25/2021 04/08/2021 01/08/2021  Glucose 70 - 99 mg/dL 89 91 102(H)  BUN 8 - 23 mg/dL 15 11 11   Creatinine 0.44 - 1.00 mg/dL 1.13(H) 1.19(H) 1.18(H)  Sodium 135 - 145 mmol/L 138 139 139  Potassium 3.5 - 5.1 mmol/L 4.0 4.5 4.1  Chloride 98 - 111 mmol/L 103 103 101  CO2 22 - 32 mmol/L 27 28 28   Calcium 8.9 - 10.3 mg/dL 9.2 9.2 9.6  Total Protein 6.5 - 8.1 g/dL 7.2 7.4 7.5  Total Bilirubin 0.3 - 1.2 mg/dL 0.6 0.4 0.6  Alkaline Phos 38 - 126 U/L 87 80 79  AST 15 - 41 U/L 33 34 32  ALT 0 - 44 U/L 28 31 25     DIAGNOSTIC IMAGING:  I have independently reviewed the scans and discussed with the patient. No results found.   ASSESSMENT:  1.  Stage IVb ovarian cancer: -History of mediastinal adenopathy and  malignant ascites. -3 cycles of carboplatin and paclitaxel followed by debulking surgery by Dr. Denman [name] on 08/07/2015 followed by 3 more cycles completed on 10/07/2015. -6 cycles of carboplatin and paclitaxel from 08/17/2017 through 11/30/2017 in the second line setting. -Germline mutation testing was negative. -Maintenance Niraparib 300 mg daily started in April 2019.  She was recommended to continue therapy indefinitely until progression by Dr. Denman [name]. -CTCAP on 12/17/2019 shows stable exam with no evidence of focal recurrence or metastatic disease.  Pelvic floor laxity with cystocele. -CTAP with contrast on 07/01/2020 with no residual or recurrent tumor or metastatic disease.   PLAN:  1.  Stage IVb ovarian cancer: - CTAP on 01/08/2021 did not show any evidence of recurrence or metastatic disease. - She is tolerating niraparib 300 mg daily without any major issues.  No GI side effects. - Reviewed labs from 10/25/2021 which showed normal LFTs.  Creatinine mildly elevated at 1.13.  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  CA125 was 6.4. - We will do anemia work-up at next labs. 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 - We will make appointment for her to see Dr. Berline Lopes in March. - RTC 4 months with labs and CA125.   2.  Elevated creatinine: - Creatinine is mildly elevated at 1.13, likely from niraparib.  This is stable.  3.  Back pain and joint pains: - Continue tramadol 50 mg as needed.   4.  Hypertension: - Continue lisinopril and metoprolol.  Blood pressure today is 179/77.   Orders placed this encounter:  No orders of the defined types were placed in this encounter.    Derek Jack, MD [place] [phone]   I, Thana Ates, am acting as a scribe for Dr. Derek Jack.  I, Derek Jack MD, have reviewed the above documentation for accuracy and  completeness, and I agree with the above.

## 2021-11-01 NOTE — Patient Instructions (Addendum)
[place] at [facility] Discharge Instructions   You were seen and examined today by Dr. Delton Coombes.  He reviewed the results of your lab work which is normal/stable.   We will call Dr. Charisse March office to get you scheduled to see her in March.  Return as scheduled in 4 months.      Thank you for choosing [place] at [facility] to provide your oncology and hematology care.  To afford each patient quality time with our provider, please arrive at least 15 minutes before your scheduled appointment time.   If you have a lab appointment with the [facility] please come in thru the Main Entrance and check in at the main information desk.  You need to re-schedule your appointment should you arrive 10 or more minutes late.  We strive to give you quality time with our providers, and arriving late affects you and other patients whose appointments are after yours.  Also, if you no show three or more times for appointments you may be dismissed from the clinic at the providers discretion.     Again, thank you for choosing [facility].  Our hope is that these requests will decrease the amount of time that you wait before being seen by our physicians.       _____________________________________________________________  Should you have questions after your visit to [facility], please contact our office at [phone] and follow the prompts.  Our office hours are 8:00 a.m. and 4:30 p.m. Monday - Friday.  Please note that voicemails left after 4:00 p.m. may not be returned until the following business day.  We are closed weekends and major holidays.  You do have access to a nurse 24-7, just call the main number to the clinic [phone] and do not press any options, hold on the line and a nurse will answer the phone.    For prescription refill requests, have your pharmacy contact our office and allow 72 hours.    Due to  Covid, you will need to wear a mask upon entering the hospital. If you do not have a mask, a mask will be given to you at the Main Entrance upon arrival. For doctor visits, patients may have 1 support person age 46 or older with them. For treatment visits, patients can not have anyone with them due to social distancing guidelines and our immunocompromised population.

## 2021-11-17 ENCOUNTER — Other Ambulatory Visit (HOSPITAL_COMMUNITY): Payer: Self-pay

## 2021-11-23 ENCOUNTER — Other Ambulatory Visit (HOSPITAL_COMMUNITY): Payer: Self-pay

## 2021-11-25 ENCOUNTER — Encounter: Payer: Self-pay | Admitting: Gynecologic Oncology

## 2021-11-26 ENCOUNTER — Encounter: Payer: Self-pay | Admitting: Gynecologic Oncology

## 2021-11-26 ENCOUNTER — Inpatient Hospital Stay: Payer: Medicare Other | Attending: Gynecologic Oncology | Admitting: Gynecologic Oncology

## 2021-11-26 ENCOUNTER — Other Ambulatory Visit: Payer: Self-pay

## 2021-11-26 VITALS — BP 168/80 | HR 87 | Temp 98.2°F | Resp 16 | Ht 65.75 in | Wt 161.0 lb

## 2021-11-26 DIAGNOSIS — C569 Malignant neoplasm of unspecified ovary: Secondary | ICD-10-CM | POA: Insufficient documentation

## 2021-11-26 DIAGNOSIS — Z9221 Personal history of antineoplastic chemotherapy: Secondary | ICD-10-CM | POA: Diagnosis not present

## 2021-11-26 DIAGNOSIS — C562 Malignant neoplasm of left ovary: Secondary | ICD-10-CM

## 2021-11-26 DIAGNOSIS — Z90722 Acquired absence of ovaries, bilateral: Secondary | ICD-10-CM | POA: Diagnosis not present

## 2021-11-26 DIAGNOSIS — Z9079 Acquired absence of other genital organ(s): Secondary | ICD-10-CM | POA: Insufficient documentation

## 2021-11-26 DIAGNOSIS — Z79899 Other long term (current) drug therapy: Secondary | ICD-10-CM | POA: Diagnosis not present

## 2021-11-26 DIAGNOSIS — Z90710 Acquired absence of both cervix and uterus: Secondary | ICD-10-CM | POA: Insufficient documentation

## 2021-11-26 NOTE — Progress Notes (Signed)
Gynecologic Oncology Return Clinic Visit ° °11/26/2021 ° °Reason for Visit: Surveillance visit in the setting of history of ovarian cancer ° °Treatment History: °Oncology History  °Ovarian cancer (HCC)  °04/25/2015 - 04/28/2015 Hospital Admission  ° Nausea/diarrhea.  Oncology consult completed on 04/27/2015. °  °04/25/2015 Tumor Marker  ° CA 125- 3902.      CEA WNL °  °04/25/2015 Imaging  ° CT Abd/pelvis- Significant ascites with multiple peritoneal based soft tissue masses within the pelvis likely representing ovarian cancer with peritoneal carcinomatosis. °  °04/27/2015 Procedure  ° US Paracentesis- A total of approximately 3700 mL of amber colored fluid was removed. A fluid sample was sent for laboratory analysis. °  °04/27/2015 Imaging  ° CT Chest- Mild left supraclavicular lymphadenopathy and moderate mediastinal lymphadenopathy involving the prevascular, subcarinal and bilateral pericardiophrenic nodal chains, likely metastatic. °  °04/27/2015 Pathology Results  ° Diagnosis PERITONEAL/ASCITIC FLUID(SPECIMEN 1 OF 1 COLLECTED 04/27/15): MALIGNANT CELLS CONSISTENT WITH METASTATIC ADENOCARCINOMA.  The immunophenotype is most consistent with a gynecologic primary, most likely ovary. °  °05/08/2015 Miscellaneous  ° Seen by Dr. Rossi- recommending Carboplatin/Paclitaxel x 3 cycles and return visit to see her (within 1 week of administration of third cycle) to evaluate for optimal sequencing of treatment modalities. °  °05/13/2015 - 05/15/2015 Hospital Admission  ° Hospitalized for AKI °  °05/19/2015 - 07/01/2015 Chemotherapy  ° Carboplatin/Paclitaxel x 3 cycles °  °07/28/2015 Surgery  ° Exploratory laparotomy with total abdominal hysterectomy, bilateral salpingo-oophorectomy, omentectomy radical tumor debulking for ovarian cancer; by Dr. Emma Rossi. °  °07/28/2015 Pathology Results  ° Uterus +/- tubes/ovaries, neoplastic, cervix - HIGH GRADE SEROUS CARCINOMA INVOLVING LEFT OVARY, LEFT FALLOPIAN TUBE AND RIGHT FALLOPIAN TUBE. - CERVIX,  ENDOMETRIUM AND MYOMETRIUM ARE FREE OF TUMOR. 2. Cul-de-sac biop... °  °08/26/2015 -  Chemotherapy  ° Carboplatin/Paclitaxel x 3 cycles °  °12/14/2015 Genetic Testing  ° MSH6 c.2667G>T VUS found on the Breast/Ovarian cancer panel.  The Breast/Ovarian gene panel offered by [facility] includes sequencing and rearrangement analysis for the following 20 genes:  ATM, BARD1, BRCA1, BRCA2, BRIP1, CDH1, CHEK2, EPCAM, FANCC, MLH1, MSH2, MSH6, NBN, PALB2, PMS2, PTEN, RAD51C, RAD51D, TP53, and XRCC2.   The report date is 12/14/2015. ° °Update:  MSH6 c.2667G>T VUS has been reclassified from a variant of uncertain significance to a likely benign variant based on a combination of sources, e.g., internal data, published literature, population databases and in silico models.  The updated report date is October 29, 2019. °  °12/23/2015 Miscellaneous  ° Genetic counseling.  Genetic testing was normal, and did not reveal a deleterious mutation in these genes °  °03/07/2017 Imaging  ° CT C/A/P IMPRESSION: °Chest Impression: °  °No evidence of metastatic disease in thorax. °  °Abdomen / Pelvis Impression: °  °1. Interval increase in size of solitary nodular peritoneal implant °in the LEFT upper quadrant . °2. No additional evidence of peritoneal nodularity. °3. No ascites. °  °07/31/2017 Progression  ° CT C/A/P: °IMPRESSION: °1. Left upper quadrant nodule continues to progress, now measuring °20 x 26 mm. °2. New nodule identified between the in adrenal gland, concerning °for metastatic deposit. °3. Interval development of tiny nodules in the left lower quadrant °mesenteric, concerning for metastases. °4. No ascites. °5. Status post total abdominal hysterectomy and omentectomy. °  ° °Latasha [name] is a very pleasant 81 year old G1 who was originally seen in consultation at the request of Dr Shannon Penland for (clinical) stage IVB ovarian cancer. The patient developed symptoms   of progressive abdominal fullness and discomfort over the course of  several months. In August, 2016 she presented to the [facility] where imaging of the abdomen and pelvis was obtained for diarrhea, nausea and emesis. Imaging on 04/25/15 revealed large volume ascites, carcinomatosis and peritoneal masses measuring up to 5.1cm. The patient underwent paracentesis with cytology on 04/27/15 which revealed metastatic adenocarcinoma consistent on immunostains with gyn primary. °  °CT of the chest on 04/27/15 revealed mild left supraclavicular lymphadenopathy, and mediastinal lymphadenopathy consistent with metastatic disease. There were <3mm pulmonary nodules also seen which were indeterminant. There were trace pleural effusions seen.  °  °CA 125 on 04/25/15: 3902. °  °She then went on to receive 3 cycles of paclitaxel and carboplatin chemotherapy with Dr Penland. °Cycle 3 was on 07/01/15. °  °CA 125 on 07/01/15 was 249. °  °CT scan on 07/20/15 showed: Previously noted ascites has resolved. No pneumoperitoneum. °Many of the previously noted peritoneal implants are no longer confidently identified on today's examination. There are few smaller implants noted, the largest of which is in th  left side of the abdomen anteriorly measuring 9 x 11 mm. There is also a 10 x 7 mm lesion superficial to the splenic flexure of the colon which is substantially smaller than the prior study (previously 2.8 x 1.7 cm). °  °There was resolution of the chest adenopathy. °  °She has tolerated chemotherapy well with a good appetites and good general function. °  °On 07/31/15 she was taken to the OR for an ex lap, TAH, BSO, omentectomy, radical tumor debulking. Intraoperative findings included: Excellent response to neoadjuvant chemotherapy. Small volume plaques/nodules in left and right posterior cul de sac behind uterus, tumor on left ovary (3cm), multiple 2cm nodules in omentum. No other peritoneal disease. Diaphragm normal.    °She had an optimal/complete cytoreduction (R0) with no gross visible disease  remaining.  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 °  °Postoperatively she did very well with no postoperative complications. °  °She went on to receive 3 additional adjuvant cycles of carboplatin and paclitaxel completed April, 2017. She tolerated therapy very well.  °Post treatment scans were unremarkable and showed no residual disease. °  °Her CA 125 was monitored at 3 monthly intervals. °In January, 2018 it was normal at 17. On April, 20th ,2018 it had increased to 32. On June 15th, 2018 it had increased again to 47. °CT chest/abdo/pelvis on 03/07/17 showed solitary pertoneal nodule in the gastrosplenic ligament measuring 18mm x 16mm (increased from a prior scan where it was 1cm). No additional nodularity is noted nor ascites. °  °She was offered secondary cytoreduction surgery followed by chemotherapy, however she declined this and opted for expectant management because she felt good. °  °On 07/31/17 repeat CT abdo/pelvis was performed. It showed progression of peritoneal disease with an enlargement of a peri-splenic lesion, a new nodule adjacent to the adrenal gland and a new left lower quadrant peritoneal metastases. °  °She agreed to commend salvage 2nd line chemotherapy with carboplatin and paclitaxel in December, 2018 due to the onset of symptomatic recurrence. °She completed 6 cycles of this on 11/30/17 and was then changed to Niraparib maintenance 300mg daily starting in March, 2019. °  °CA 125 was normal at 9 on 03/26/18.  °CT scan abd/pelvis in July, 2019 was normal with no evidence of recurrent/progressive disease.  °CT scan in December, 2019 was stable with no progression. °  °CA 125   on 11/15/18 was normal and stable at 6.7. CT scan abd/pelvis on 11/15/18 showed no new progression or recurrence.    CA 125 on 01/22/19 was normal at 6. CA 125 on 03/11/19 was normal at 6.7. CA 125 on 05/07/19 was normal at 6.9. CA 125  On 10/15/19 was normal at 6.2.   CT abd/pelvis  on 07/12/19 showed no evidence of recurrent/progressive disease.   CA 125 on 12/17/19 was normal at 6.7 CT chest/abd/pelvis on 12/17/19 showed no evidence of recurrent disease.  CA 125 on 03/24/20 was normal at 6.2. CA 125 on 10/01/20 was normal at 6.4 CA 125 on 01/08/21 was normal at 6.3.   CT scan on 01/08/21 was normal and showed no evidence of recurrence.    Interval History: Patient presents today for surveillance visit.  She reports overall doing very well without complaints.  Denies any vaginal bleeding or discharge.  Denies abdominal or pelvic pain.  Reports regular bowel and bladder function.  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[place])    Schizophrenia ([place])    Ulcerative colitis ([place])    Vitamin D deficiency     Past Surgical History:  Procedure Laterality Date   ABDOMINAL HYSTERECTOMY N/A 07/28/2015   Procedure: TOTAL HYSTERECTOMY ABDOMINAL BILATERAL SALPINGO OOPHRORECTOMY;  Surgeon: Everitt Amber, MD;  Location: [facility];  Service: Gynecology;  Laterality: N/A;   DEBULKING N/A 07/28/2015   Procedure: DEBULKING;  Surgeon: Everitt Amber, MD;  Location: [facility];  Service: Gynecology;  Laterality: N/A;   LAPAROTOMY N/A 07/28/2015   Procedure: EXPLORATORY LAPAROTOMY;  Surgeon: Everitt Amber, MD;  Location: [facility];  Service: Gynecology;  Laterality: N/A;   OMENTECTOMY N/A 07/28/2015   Procedure: OMENTECTOMY ;  Surgeon: Everitt Amber, MD;  Location: [facility];  Service: Gynecology;  Laterality: N/A;   PARACENTESIS  04/27/15   PORTACATH PLACEMENT Right 04/2015   REPLACEMENT TOTAL KNEE     right knee in 2003    Family History  Problem Relation Age of Onset   Congestive Heart Failure Mother    Seizures Sister    Prostate cancer Brother    Cancer Paternal  Uncle        1 uncle with cancer NOS   Lung cancer Maternal Grandmother    Hypertension Maternal Grandfather    Colon cancer Neg Hx    Breast cancer Neg Hx    Ovarian cancer Neg Hx    Endometrial cancer Neg Hx    Pancreatic cancer Neg Hx     Social History   Socioeconomic History   Marital status: Single    Spouse name: Not on file   Number of children: 1   Years of education: Not on file   Highest education level: Not on file  Occupational History   Not on file  Tobacco Use   Smoking status: Never   Smokeless tobacco: Never  Vaping Use   Vaping Use: Never used  Substance and Sexual Activity   Alcohol use: No   Drug use: No   Sexual activity: Not Currently  Other Topics Concern   Not on file  Social History Narrative   Not on file   Social Determinants of Health  Financial Resource Strain: Not on file  °Food Insecurity: Not on file  °Transportation Needs: Not on file  °Physical Activity: Not on file  °Stress: Not on file  °Social Connections: Not on file  ° ° °Current Medications: ° °Current Outpatient Medications:  °  acetaminophen (TYLENOL) 500 MG tablet, Take 2 tablets (1,000 mg total) by mouth every 12 (twelve) hours., Disp: 30 tablet, Rfl: 0 °  amLODipine (NORVASC) 10 MG tablet, Take 10 mg by mouth every evening. , Disp: , Rfl:  °  aspirin EC 81 MG tablet, Take 81 mg by mouth daily., Disp: , Rfl:  °  cholecalciferol (VITAMIN D) 25 MCG (1000 UNIT) tablet, Take 1,000 Units by mouth daily., Disp: , Rfl:  °  docusate sodium (COLACE) 100 MG capsule, Take 100 mg by mouth 2 (two) times daily., Disp: , Rfl:  °  folic acid (FOLVITE) 1 MG tablet, Take 1 mg by mouth daily., Disp: , Rfl:  °  lisinopril (PRINIVIL,ZESTRIL) 40 MG tablet, Take 40 mg by mouth daily. , Disp: , Rfl: 2 °  loratadine (CLARITIN) 10 MG tablet, Take 10 mg by mouth daily., Disp: , Rfl:  °  lovastatin (MEVACOR) 10 MG tablet, Take 10 mg by mouth at bedtime., Disp: , Rfl:  °  melatonin 5 MG TABS, Take 5 mg by mouth at  bedtime., Disp: , Rfl:  °  metoprolol tartrate (LOPRESSOR) 25 MG tablet, Take 1 tablet (25 mg total) by mouth 2 (two) times daily., Disp: 60 tablet, Rfl: 6 °  mirtazapine (REMERON) 7.5 MG tablet, Take 7.5 mg by mouth at bedtime., Disp: , Rfl:  °  Multiple Vitamin (MULTIVITAMIN WITH MINERALS) TABS tablet, Take 1 tablet by mouth daily., Disp: , Rfl:  °  niraparib tosylate (ZEJULA) 100 MG capsule, TAKE 3 CAPSULES (300 MG) BY MOUTH DAILY., Disp: 90 capsule, Rfl: 3 °  ondansetron (ZOFRAN) 8 MG tablet, Take 1 tablet (8 mg total) by mouth 2 (two) times daily as needed for refractory nausea / vomiting. Start on day 3 after chemo., Disp: 30 tablet, Rfl: 1 °  prochlorperazine (COMPAZINE) 10 MG tablet, Take 1 tablet (10 mg total) by mouth every 6 (six) hours as needed (Nausea or vomiting)., Disp: 60 tablet, Rfl: 3 °  QUEtiapine (SEROQUEL XR) 300 MG 24 hr tablet, Take 300 mg by mouth at bedtime., Disp: , Rfl:  °  sulfaSALAzine (AZULFIDINE) 500 MG tablet, Take 500 mg by mouth 3 (three) times daily., Disp: , Rfl:  °  traMADol (ULTRAM) 50 MG tablet, TAKE (1) TABLET BY MOUTH EVERY SIX HOURS AS NEEDED FOR PAIN., Disp: 90 tablet, Rfl: 0 °  vitamin B-12 (CYANOCOBALAMIN) 1000 MCG tablet, TAKE 1 TABLET BY MOUTH ONCE DAILY., Disp: 30 tablet, Rfl: 11 ° °Review of Systems: °Denies appetite changes, fevers, chills, fatigue, unexplained weight changes. °Denies hearing loss, neck lumps or masses, mouth sores, ringing in ears or voice changes. °Denies cough or wheezing.  Denies shortness of breath. °Denies chest pain or palpitations. Denies leg swelling. °Denies abdominal distention, pain, blood in stools, constipation, diarrhea, nausea, vomiting, or early satiety. °Denies pain with intercourse, dysuria, frequency, hematuria or incontinence. °Denies hot flashes, pelvic pain, vaginal bleeding or vaginal discharge.   °Denies joint pain, back pain or muscle pain/cramps. °Denies itching, rash, or wounds. °Denies dizziness, headaches, numbness or  seizures. °Denies swollen lymph nodes or glands, denies easy bruising or bleeding. °Denies anxiety, depression, confusion, or decreased concentration. ° °Physical Exam: °BP (!) 168/80 (BP Location: Left Arm, Patient Position: Sitting)      Pulse 87    Temp 98.2 °F (36.8 °C) (Oral)    Resp 16    Ht 5' 5.75" (1.67 m)    Wt 161 lb (73 kg)    SpO2 98%    BMI 26.19 kg/m²  °General: Alert, oriented, no acute distress. °HEENT: Normocephalic, atraumatic, sclera anicteric. °Chest: Clear to auscultation bilaterally.  No wheezes or rhonchi.  Port site clean and intact. °Cardiovascular: Regular rate and rhythm, no murmurs. °Abdomen: nontender.  Normoactive bowel sounds.  No masses or hepatosplenomegaly appreciated.  Well-healed scar. °Extremities: Grossly normal range of motion.  Warm, well perfused.  No edema bilaterally. °Skin: No rashes or lesions noted. °Lymphatics: No cervical, supraclavicular, or inguinal adenopathy. °GU: Normal appearing external genitalia without erythema, excoriation, or lesions.  Speculum exam reveals some anterior prolapse, cuff intact, no masses.  Bimanual exam reveals cuff smooth, no masses or nodularity.  Rectovaginal exam confirms findings. ° °Laboratory & Radiologic Studies: °Component Ref Range & Units 1 mo ago °(10/25/21) 7 mo ago °(04/08/21) 10 mo ago °(01/08/21) 1 yr ago °(10/01/20) 1 yr ago °(07/01/20) 1 yr ago °(06/05/20) 1 yr ago °(03/24/20)  °Cancer Antigen (CA) 125 0.0 - 38.1 U/mL 6.4  7.5 CM  6.3 CM  6.4 CM  6.6 CM  6.2 CM  6.2   ° °Assessment & Plan: °Sydna Dauenhauer is a 80 y.o. 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 BRCA negative on testing. °S/p salvage 2nd line chemotherapy with carb/taxol completed 11/30/17. Now on Niraparib since March, 2019. °No measurable disease.  °Tolerating maintenance PARPi with Zejula. Recommend continuing this indefinitely or up to five years (2024) as she is tolerating treatment well and is disease free. °  °Patient is  overall doing very well and remains symptom-free.  She is NED on exam today.  Plan has been continued yearly imaging while she is on Zejula.  I have put an order in for imaging as she will be due within the next month.  CA-125 was just checked once a month ago and normal.   ° °She and I reviewed signs and symptoms that would be concerning for disease recurrence and I stressed the importance of calling if she develops any of these before her next visit.  Otherwise, I will plan to see her back in 6 months for surveillance visit and exam. ° °She sees Dr. Katragadda in June. ° °28 minutes of total time was spent for this patient encounter, including preparation, face-to-face counseling with the patient and coordination of care, and documentation of the encounter. ° ° , MD  °Division of Gynecologic Oncology  °Department of Obstetrics and Gynecology  °[facility]  ° °

## 2021-11-26 NOTE — Patient Instructions (Addendum)
It was very nice to meet you today.  I do not see or feel any evidence of cancer recurrence on your exam.  I will see you back for follow-up visit in 6 months.  If you develop any new or concerning symptoms before then, please call to see me sooner. ? ?Since my schedule is only out through the end of June, please call our office in July or August to get a visit scheduled with me in mid September. ?

## 2021-12-01 ENCOUNTER — Telehealth: Payer: Self-pay | Admitting: *Deleted

## 2021-12-01 NOTE — Telephone Encounter (Signed)
Per Dr Berline Lopes, scheduled the patient for a CT scan at [facility] on 4/13 at 12 pm. Called and left the patient a message to call the office back regarding appt  ?

## 2021-12-01 NOTE — Telephone Encounter (Signed)
Patient called back and was given the date/time/instructions for the CT scan on April 13th at 12 pm. Patient needs to go to [facility] for lab appt before CT scan date. Patient verbalized understanding  ?

## 2021-12-23 ENCOUNTER — Other Ambulatory Visit (HOSPITAL_COMMUNITY): Payer: Self-pay

## 2021-12-27 ENCOUNTER — Telehealth: Payer: Self-pay | Admitting: *Deleted

## 2021-12-27 ENCOUNTER — Other Ambulatory Visit (HOSPITAL_COMMUNITY)
Admission: RE | Admit: 2021-12-27 | Discharge: 2021-12-27 | Disposition: A | Discharge status: Home / Self Care | Payer: Medicare Other | Source: Ambulatory Visit | Attending: Gynecologic Oncology | Admitting: Gynecologic Oncology

## 2021-12-27 ENCOUNTER — Other Ambulatory Visit (HOSPITAL_COMMUNITY): Payer: Self-pay

## 2021-12-27 DIAGNOSIS — C569 Malignant neoplasm of unspecified ovary: Secondary | ICD-10-CM | POA: Diagnosis present

## 2021-12-27 LAB — BASIC METABOLIC PANEL
Anion gap: 7 (ref 5–15)
BUN: 19 mg/dL (ref 8–23)
CO2: 27 mmol/L (ref 22–32)
Calcium: 9.5 mg/dL (ref 8.9–10.3)
Chloride: 104 mmol/L (ref 98–111)
Creatinine, Ser: 1.36 mg/dL — ABNORMAL HIGH (ref 0.44–1.00)
GFR, Estimated: 40 mL/min — ABNORMAL LOW (ref >60)
Glucose, Bld: 101 mg/dL — ABNORMAL HIGH (ref 70–99)
Potassium: 4.5 mmol/L (ref 3.5–5.1)
Sodium: 138 mmol/L (ref 135–145)

## 2021-12-27 NOTE — Telephone Encounter (Signed)
Informed pt of her creatinine level from her BMP. Her Creatinine is 1.36. Per Joylene John, NP pt needs to stay hydrated and stay away from NSAIDS. Pt verbalized understanding.  ?

## 2021-12-27 NOTE — Telephone Encounter (Signed)
BMP results faxed to [place] office.  ?

## 2021-12-28 ENCOUNTER — Other Ambulatory Visit (HOSPITAL_COMMUNITY): Payer: Self-pay

## 2021-12-29 ENCOUNTER — Other Ambulatory Visit (HOSPITAL_COMMUNITY): Payer: Self-pay

## 2021-12-30 ENCOUNTER — Ambulatory Visit (HOSPITAL_COMMUNITY)
Admission: RE | Admit: 2021-12-30 | Discharge: 2021-12-30 | Disposition: A | Discharge status: Home / Self Care | Payer: Medicare Other | Source: Ambulatory Visit | Attending: Gynecologic Oncology | Admitting: Gynecologic Oncology

## 2021-12-30 DIAGNOSIS — C569 Malignant neoplasm of unspecified ovary: Secondary | ICD-10-CM | POA: Insufficient documentation

## 2021-12-30 DIAGNOSIS — C562 Malignant neoplasm of left ovary: Secondary | ICD-10-CM | POA: Diagnosis present

## 2021-12-30 MED ORDER — IOHEXOL 300 MG/ML  SOLN
100.0000 mL | Freq: Once | INTRAMUSCULAR | Status: AC | PRN
  Administered 2021-12-30: 75 mL via INTRAVENOUS

## 2021-12-30 MED ORDER — HEPARIN SOD (PORK) LOCK FLUSH 100 UNIT/ML IV SOLN
INTRAVENOUS | Status: AC
  Filled 2021-12-30: qty 5

## 2021-12-31 ENCOUNTER — Telehealth: Payer: Self-pay

## 2021-12-31 NOTE — Telephone Encounter (Signed)
Spoke with Latasha [name] this afternoon. Per Dr. Berline Lopes CT scan is stable, and no evidence of cancer recurrence. Patient verbalized understanding and very happy with the news. Instructed to call with any needs.  ?

## 2022-01-17 ENCOUNTER — Other Ambulatory Visit (HOSPITAL_COMMUNITY): Payer: Self-pay

## 2022-01-19 ENCOUNTER — Other Ambulatory Visit (HOSPITAL_COMMUNITY): Payer: Self-pay | Admitting: Hematology

## 2022-01-19 ENCOUNTER — Other Ambulatory Visit (HOSPITAL_COMMUNITY): Payer: Self-pay

## 2022-01-19 MED ORDER — ZEJULA 100 MG PO CAPS
ORAL_CAPSULE | ORAL | 3 refills | Status: DC
  Filled 2022-01-19: qty 90, 30d supply, fill #0
  Filled 2022-02-17: qty 90, 30d supply, fill #1
  Filled 2022-03-14: qty 90, 30d supply, fill #2
  Filled 2022-04-13: qty 90, 30d supply, fill #3

## 2022-01-24 ENCOUNTER — Other Ambulatory Visit (HOSPITAL_COMMUNITY): Payer: Self-pay

## 2022-01-25 ENCOUNTER — Other Ambulatory Visit (HOSPITAL_COMMUNITY): Payer: Self-pay

## 2022-02-17 ENCOUNTER — Other Ambulatory Visit (HOSPITAL_COMMUNITY): Payer: Self-pay

## 2022-02-18 ENCOUNTER — Other Ambulatory Visit (HOSPITAL_COMMUNITY): Payer: Self-pay

## 2022-02-28 ENCOUNTER — Other Ambulatory Visit (HOSPITAL_COMMUNITY): Payer: Self-pay | Admitting: Hematology

## 2022-03-01 ENCOUNTER — Inpatient Hospital Stay (HOSPITAL_COMMUNITY): Payer: Medicare Other | Attending: Hematology

## 2022-03-01 DIAGNOSIS — C562 Malignant neoplasm of left ovary: Secondary | ICD-10-CM

## 2022-03-01 DIAGNOSIS — Z90710 Acquired absence of both cervix and uterus: Secondary | ICD-10-CM | POA: Insufficient documentation

## 2022-03-01 DIAGNOSIS — C563 Malignant neoplasm of bilateral ovaries: Secondary | ICD-10-CM | POA: Insufficient documentation

## 2022-03-01 DIAGNOSIS — Z79899 Other long term (current) drug therapy: Secondary | ICD-10-CM | POA: Diagnosis not present

## 2022-03-01 DIAGNOSIS — I10 Essential (primary) hypertension: Secondary | ICD-10-CM | POA: Diagnosis not present

## 2022-03-01 DIAGNOSIS — Z9221 Personal history of antineoplastic chemotherapy: Secondary | ICD-10-CM | POA: Insufficient documentation

## 2022-03-01 DIAGNOSIS — D529 Folate deficiency anemia, unspecified: Secondary | ICD-10-CM

## 2022-03-01 LAB — CBC WITH DIFFERENTIAL/PLATELET
Abs Immature Granulocytes: 0.01 10*3/uL (ref 0.00–0.07)
Basophils Absolute: 0.1 10*3/uL (ref 0.0–0.1)
Basophils Relative: 1 %
Eosinophils Absolute: 0.4 10*3/uL (ref 0.0–0.5)
Eosinophils Relative: 8 %
HCT: 31.9 % — ABNORMAL LOW (ref 36.0–46.0)
Hemoglobin: 10.6 g/dL — ABNORMAL LOW (ref 12.0–15.0)
Immature Granulocytes: 0 %
Lymphocytes Relative: 24 %
Lymphs Abs: 1.1 10*3/uL (ref 0.7–4.0)
MCH: 35.9 pg — ABNORMAL HIGH (ref 26.0–34.0)
MCHC: 33.2 g/dL (ref 30.0–36.0)
MCV: 108.1 fL — ABNORMAL HIGH (ref 80.0–100.0)
Monocytes Absolute: 0.7 10*3/uL (ref 0.1–1.0)
Monocytes Relative: 15 %
Neutro Abs: 2.3 10*3/uL (ref 1.7–7.7)
Neutrophils Relative %: 52 %
Platelets: 300 10*3/uL (ref 150–400)
RBC: 2.95 MIL/uL — ABNORMAL LOW (ref 3.87–5.11)
RDW: 13.2 % (ref 11.5–15.5)
WBC: 4.5 10*3/uL (ref 4.0–10.5)
nRBC: 0 % (ref 0.0–0.2)

## 2022-03-01 LAB — COMPREHENSIVE METABOLIC PANEL
ALT: 25 U/L (ref 0–44)
AST: 32 U/L (ref 15–41)
Albumin: 4.2 g/dL (ref 3.5–5.0)
Alkaline Phosphatase: 84 U/L (ref 38–126)
Anion gap: 5 (ref 5–15)
BUN: 9 mg/dL (ref 8–23)
CO2: 29 mmol/L (ref 22–32)
Calcium: 9.3 mg/dL (ref 8.9–10.3)
Chloride: 106 mmol/L (ref 98–111)
Creatinine, Ser: 1.08 mg/dL — ABNORMAL HIGH (ref 0.44–1.00)
GFR, Estimated: 53 mL/min — ABNORMAL LOW (ref >60)
Glucose, Bld: 92 mg/dL (ref 70–99)
Potassium: 4.1 mmol/L (ref 3.5–5.1)
Sodium: 140 mmol/L (ref 135–145)
Total Bilirubin: 0.6 mg/dL (ref 0.3–1.2)
Total Protein: 7.3 g/dL (ref 6.5–8.1)

## 2022-03-01 LAB — IRON AND TIBC
Iron: 87 ug/dL (ref 28–170)
Saturation Ratios: 36 % — ABNORMAL HIGH (ref 10.4–31.8)
TIBC: 241 ug/dL — ABNORMAL LOW (ref 250–450)
UIBC: 154 ug/dL

## 2022-03-01 LAB — FOLATE: Folate: >40 ng/mL (ref >5.9)

## 2022-03-01 LAB — MAGNESIUM: Magnesium: 2.1 mg/dL (ref 1.7–2.4)

## 2022-03-01 LAB — FERRITIN: Ferritin: 748 ng/mL — ABNORMAL HIGH (ref 11–307)

## 2022-03-01 LAB — VITAMIN B12: Vitamin B-12: 837 pg/mL (ref 180–914)

## 2022-03-02 LAB — CA 125: Cancer Antigen (CA) 125: 7.1 U/mL (ref 0.0–38.1)

## 2022-03-08 ENCOUNTER — Inpatient Hospital Stay (HOSPITAL_BASED_OUTPATIENT_CLINIC_OR_DEPARTMENT_OTHER): Payer: Medicare Other | Admitting: Hematology

## 2022-03-08 ENCOUNTER — Other Ambulatory Visit (HOSPITAL_COMMUNITY): Payer: Self-pay | Admitting: *Deleted

## 2022-03-08 VITALS — BP 158/80 | HR 80 | Temp 97.6°F | Resp 18 | Ht 66.0 in | Wt 161.8 lb

## 2022-03-08 DIAGNOSIS — C562 Malignant neoplasm of left ovary: Secondary | ICD-10-CM | POA: Diagnosis not present

## 2022-03-08 DIAGNOSIS — C563 Malignant neoplasm of bilateral ovaries: Secondary | ICD-10-CM | POA: Diagnosis not present

## 2022-03-08 MED ORDER — TRAMADOL HCL 50 MG PO TABS: 50.0000 mg | ORAL_TABLET | ORAL | 0 refills | Status: DC | PRN

## 2022-03-08 NOTE — Progress Notes (Signed)
Patient is taking Zejula as prescribed.  She has not missed any doses and reports no side effects at this time.

## 2022-03-08 NOTE — Progress Notes (Signed)
[facility] [address] [place] [address]   CLINIC:  Medical Oncology/Hematology  PCP:  Abran Richard, MD [address] [place] / [place] [address] [phone]   REASON FOR VISIT:  Follow-up for left ovarian cancer  PRIOR THERAPY:  1. Carboplatin and paclitaxel x 3 cycles from 05/19/2015 to 07/01/2015, then 3 more cycles through 10/07/2015. Afterwards, x 6 cycles from 08/17/2017 to 11/30/2017. 2. Debulking surgery with TAH-BSO on 08/07/2015.  NGS Results: not done  CURRENT THERAPY: Zejula 300 mg daily  BRIEF ONCOLOGIC HISTORY:  Oncology History  Ovarian cancer ([facility])  04/25/2015 - 04/28/2015 Hospital Admission   Nausea/diarrhea.  Oncology consult completed on 04/27/2015.   04/25/2015 Tumor Marker   CA 125- 3902.      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   04/27/2015 Procedure   US Paracentesis- A total of approximately 3700 mL of amber colored fluid was removed. A fluid sample was sent for laboratory analysis.   04/27/2015 Imaging   CT Chest- Mild left supraclavicular lymphadenopathy and moderate mediastinal lymphadenopathy involving the prevascular, subcarinal and bilateral pericardiophrenic nodal chains, likely metastatic.   04/27/2015 Pathology Results   Diagnosis PERITONEAL/ASCITIC FLUID(SPECIMEN 1 OF 1 COLLECTED 04/27/15): MALIGNANT CELLS CONSISTENT WITH METASTATIC ADENOCARCINOMA.  The immunophenotype is most consistent with a gynecologic primary, most likely ovary.   05/08/2015 Miscellaneous   Seen by Dr. Denman George- recommending Carboplatin/Paclitaxel x 3 cycles and return visit to see her (within 1 week of administration of third cycle) to evaluate for optimal sequencing of treatment modalities.   05/13/2015 - 05/15/2015 Hospital Admission   Hospitalized for AKI   05/19/2015 - 07/01/2015  Chemotherapy   Carboplatin/Paclitaxel x 3 cycles   07/28/2015 Surgery   Exploratory laparotomy with total abdominal hysterectomy, bilateral salpingo-oophorectomy, omentectomy radical tumor debulking for ovarian cancer; by Dr. Everitt Amber.   07/28/2015 Pathology Results   Uterus +/- tubes/ovaries, neoplastic, cervix - HIGH GRADE SEROUS CARCINOMA INVOLVING LEFT OVARY, LEFT FALLOPIAN TUBE AND RIGHT FALLOPIAN TUBE. - CERVIX, ENDOMETRIUM AND MYOMETRIUM ARE FREE OF TUMOR. 2. Cul-de-sac biop...   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  The Breast/Ovarian gene panel offered by [facility] includes sequencing and rearrangement analysis for the following 20 genes:  ATM, BARD1, BRCA1, BRCA2, BRIP1, CDH1, CHEK2, EPCAM, FANCC, MLH1, MSH2, MSH6, NBN, PALB2, PMS2, PTEN, RAD51C, RAD51D, TP53, and XRCC2.   The report date is 12/14/2015.  Update:  MSH6 c.2667G>T VUS has been reclassified from a variant of uncertain significance to a likely benign variant based on a combination of sources, e.g., internal data, published literature, population databases and in [place].  The updated report date is October 29, 2019.   12/23/2015 Miscellaneous   Genetic counseling.  Genetic testing was normal, and did not reveal a deleterious mutation in these genes   03/07/2017 Imaging   CT C/A/P IMPRESSION: Chest Impression:   No evidence of metastatic disease in thorax.   Abdomen / Pelvis Impression:   1. Interval increase in size of solitary nodular peritoneal implant in the LEFT upper quadrant . 2. No additional evidence of peritoneal nodularity. 3. No ascites.   07/31/2017 Progression   CT C/A/P: IMPRESSION: 1. Left upper quadrant nodule continues to progress, now measuring 20 x 26 mm. 2.  New nodule identified between the in adrenal gland, concerning for metastatic deposit. 3. Interval development of  tiny nodules in the left lower quadrant mesenteric, concerning for metastases. 4. No ascites. 5. Status post total abdominal hysterectomy and omentectomy.     CANCER STAGING: Cancer Staging  Ovarian cancer [facility]) Staging form: Ovary, AJCC 7th Edition - Clinical stage from 04/27/2015: FIGO Stage IVB, calculated as Stage IV (T3c, N1, M1) - Signed by Baird Cancer, PA-C on 09/16/2015 - Pathologic stage from 05/08/2015: FIGO Stage IVB, calculated as Stage IV (TX, N1, M1) - Signed by Everitt Amber, MD on 05/08/2015 - Pathologic stage from 07/28/2015: FIGO Stage IV (T3c, NX, M1) - Signed by Baird Cancer, PA-C on 09/16/2015   INTERVAL HISTORY:  Latasha [name], a 81 y.o. female, returns for routine follow-up of her  left ovarian cancer. Kambre was last seen on 11/01/2021.   Today she reports feeling good. She denies n/v/d and abdominal pain. Her weight is stable. She is taking ZEJULA and reports tolerating it well. She reports occasional back pain following activities such as yard work.  REVIEW OF SYSTEMS:  Review of Systems  Constitutional:  Negative for appetite change, fatigue and unexpected weight change.  Gastrointestinal:  Negative for abdominal pain, diarrhea, nausea and vomiting.  Musculoskeletal:  Positive for back pain (occasional).  All other systems reviewed and are negative.   PAST MEDICAL/SURGICAL HISTORY:  Past Medical History:  Diagnosis Date   Arthritis    B12 deficiency    Cataract    History of blood transfusion    History of chemotherapy    Hypertension    Iron deficiency anemia    Mixed hyperlipidemia    Ovarian cancer ([place])    Regional enteritis ([place])    Schizophrenia ([place])    Ulcerative colitis ([place])    Vitamin D deficiency    Past Surgical History:  Procedure Laterality Date   ABDOMINAL HYSTERECTOMY N/A 07/28/2015   Procedure: TOTAL HYSTERECTOMY ABDOMINAL BILATERAL SALPINGO OOPHRORECTOMY;  Surgeon: Everitt Amber, MD;  Location: [facility];  Service:  Gynecology;  Laterality: N/A;   DEBULKING N/A 07/28/2015   Procedure: DEBULKING;  Surgeon: Everitt Amber, MD;  Location: [facility];  Service: Gynecology;  Laterality: N/A;   LAPAROTOMY N/A 07/28/2015   Procedure: EXPLORATORY LAPAROTOMY;  Surgeon: Everitt Amber, MD;  Location: [facility];  Service: Gynecology;  Laterality: N/A;   OMENTECTOMY N/A 07/28/2015   Procedure: OMENTECTOMY ;  Surgeon: Everitt Amber, MD;  Location: [facility];  Service: Gynecology;  Laterality: N/A;   PARACENTESIS  04/27/15   PORTACATH PLACEMENT Right 04/2015   REPLACEMENT TOTAL KNEE     right knee in 2003    SOCIAL HISTORY:  Social History   Socioeconomic History   Marital status: Single    Spouse name: Not on file   Number of children: 1   Years of education: Not on file   Highest education level: Not on file  Occupational History   Not on file  Tobacco Use   Smoking status: Never   Smokeless tobacco: Never  Vaping Use   Vaping Use: Never used  Substance and Sexual Activity   Alcohol use: No   Drug use: No   Sexual activity: Not Currently  Other Topics Concern   Not on file  Social History Narrative   Not on file   Social Determinants of Health   Financial Resource Strain: Not on file  Food Insecurity: Not on file  Transportation Needs: Not on file  Physical  Activity: Not on file  Stress: Not on file  Social Connections: Not on file  Intimate Partner Violence: Not on file    FAMILY HISTORY:  Family History  Problem Relation Age of Onset   Congestive Heart Failure Mother    Seizures Sister    Prostate cancer Brother    Cancer Paternal Uncle        1 uncle with cancer NOS   Lung cancer Maternal Grandmother    Hypertension Maternal Grandfather    Colon cancer Neg Hx    Breast cancer Neg Hx    Ovarian cancer Neg Hx    Endometrial cancer Neg Hx    Pancreatic cancer Neg Hx     CURRENT MEDICATIONS:  Current Outpatient Medications  Medication Sig Dispense Refill   acetaminophen (TYLENOL) 500 MG tablet Take  2 tablets (1,000 mg total) by mouth every 12 (twelve) hours. 30 tablet 0   amLODipine (NORVASC) 10 MG tablet Take 10 mg by mouth every evening.      aspirin EC 81 MG tablet Take 81 mg by mouth daily.     cholecalciferol (VITAMIN D) 25 MCG (1000 UNIT) tablet Take 1,000 Units by mouth daily.     docusate sodium (COLACE) 100 MG capsule Take 100 mg by mouth 2 (two) times daily.     folic acid (FOLVITE) 1 MG tablet Take 1 mg by mouth daily.     lisinopril (PRINIVIL,ZESTRIL) 40 MG tablet Take 40 mg by mouth daily.   2   loratadine (CLARITIN) 10 MG tablet Take 10 mg by mouth daily.     lovastatin (MEVACOR) 10 MG tablet Take 10 mg by mouth at bedtime.     melatonin 5 MG TABS Take 5 mg by mouth at bedtime.     metoprolol tartrate (LOPRESSOR) 25 MG tablet Take 1 tablet (25 mg total) by mouth 2 (two) times daily. 60 tablet 6   mirtazapine (REMERON) 7.5 MG tablet Take 7.5 mg by mouth at bedtime.     Multiple Vitamin (MULTIVITAMIN WITH MINERALS) TABS tablet Take 1 tablet by mouth daily.     niraparib tosylate (ZEJULA) 100 MG capsule TAKE 3 CAPSULES (300 MG) BY MOUTH DAILY. 90 capsule 3   ondansetron (ZOFRAN) 8 MG tablet Take 1 tablet (8 mg total) by mouth 2 (two) times daily as needed for refractory nausea / vomiting. Start on day 3 after chemo. 30 tablet 1   prochlorperazine (COMPAZINE) 10 MG tablet Take 1 tablet (10 mg total) by mouth every 6 (six) hours as needed (Nausea or vomiting). 60 tablet 3   QUEtiapine (SEROQUEL XR) 300 MG 24 hr tablet Take 300 mg by mouth at bedtime.     sulfaSALAzine (AZULFIDINE) 500 MG tablet Take 500 mg by mouth 3 (three) times daily.     traMADol (ULTRAM) 50 MG tablet TAKE (1) TABLET BY MOUTH EVERY SIX HOURS AS NEEDED FOR PAIN. 90 tablet 0   vitamin B-12 (CYANOCOBALAMIN) 1000 MCG tablet TAKE 1 TABLET BY MOUTH ONCE DAILY. 90 tablet 3   No current facility-administered medications for this visit.    ALLERGIES:  No Known Allergies  PHYSICAL EXAM:  Performance status  (ECOG): 1 - Symptomatic but completely ambulatory  There were no vitals filed for this visit. Wt Readings from Last 3 Encounters:  11/26/21 161 lb (73 kg)  11/01/21 161 lb 6.4 oz (73.2 kg)  05/31/21 172 lb (78 kg)   Physical Exam   LABORATORY DATA:  I have reviewed the labs as listed.  Latest Ref Rng & Units 03/01/2022   10:13 AM 10/25/2021   10:43 AM 04/08/2021   10:24 AM  CBC  WBC 4.0 - 10.5 K/uL 4.5  5.7  5.2   Hemoglobin 12.0 - 15.0 g/dL 10.6  10.6  10.7   Hematocrit 36.0 - 46.0 % 31.9  31.8  33.1   Platelets 150 - 400 K/uL 300  285  279       Latest Ref Rng & Units 03/01/2022   10:13 AM 12/27/2021   11:04 AM 10/25/2021   10:43 AM  CMP  Glucose 70 - 99 mg/dL 92  101  89   BUN 8 - 23 mg/dL _0 Creatinine 0.44 - 1.00 mg/dL 1.08  1.36  1.13   Sodium 135 - 145 mmol/L 140  138  138   Potassium 3.5 - 5.1 mmol/L 4.1  4.5  4.0   Chloride 98 - 111 mmol/L 106  104  103   CO2 22 - 32 mmol/L _1 Calcium 8.9 - 10.3 mg/dL 9.3  9.5  9.2   Total Protein 6.5 - 8.1 g/dL 7.3   7.2   Total Bilirubin 0.3 - 1.2 mg/dL 0.6   0.6   Alkaline Phos 38 - 126 U/L 84   87   AST 15 - 41 U/L 32   33   ALT 0 - 44 U/L 25   28     DIAGNOSTIC IMAGING:  I have independently reviewed the scans and discussed with the patient. No results found.   ASSESSMENT:  1.  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 -3 cycles of carboplatin and paclitaxel followed by debulking surgery by Dr. Denman George on 08/07/2015 followed by 3 more cycles completed on 10/07/2015. -6 cycles of carboplatin and paclitaxel from 08/17/2017 through 11/30/2017 in the second line setting. -Germline mutation testing was negative. -Maintenance Niraparib 300 mg daily started in April 2019.  She was recommended to continue therapy indefinitely until progression by Dr. Denman George. -CTCAP on 12/17/2019 shows stable exam with no evidence of focal recurrence or metastatic disease.  Pelvic floor  laxity with cystocele. -CTAP with contrast on 07/01/2020 with no residual or recurrent tumor or metastatic disease.   PLAN:  1.  Stage IVb ovarian cancer: - She was evaluated by Dr. Berline Lopes on 11/26/2021.  She was recommended to continue niraparib indefinitely or up to 5 years (2024) as she is tolerating treatment well and is disease-free. - CTAP (12/30/2021): No evidence of recurrence or metastatic disease. - I have reviewed her labs which shows normal LFTs.  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  N82, folic acid were normal.  Ferritin was also in the normal range. - CA125 was normal at 7.1. - Continue niraparib 300 mg daily.  Recommend follow-up in 4 months with repeat labs and tumor marker. - We will plan to repeat imaging in 1 year.   2.  Elevated creatinine: - Creatinine is 1.08, likely from niraparib and stable.   3.  Back pain and joint pains: - Continue tramadol 50 mg daily as needed.  I have given a refill.   4.  Hypertension: - Continue lisinopril and metoprolol.  Blood pressure is 158/80.   Orders placed this encounter:  No orders of the defined types were placed in this encounter.    Derek Jack, MD [place] [phone]   I, Thana Ates, am acting as a Education administrator  for Dr. Derek Jack.  I, Derek Jack MD, have reviewed the above documentation for accuracy and completeness, and I agree with the above.

## 2022-03-08 NOTE — Patient Instructions (Signed)
[place] at [facility] Discharge Instructions   You were seen and examined today by Dr. Delton Coombes.  He reviewed your lab work which is normal/stable. Your cancer tumor markers are normal.  Continue Zejula as prescribed.   Return as scheduled in 4 months.    Thank you for choosing [place] at [facility] to provide your oncology and hematology care.  To afford each patient quality time with our provider, please arrive at least 15 minutes before your scheduled appointment time.   If you have a lab appointment with the [facility] please come in thru the Main Entrance and check in at the main information desk.  You need to re-schedule your appointment should you arrive 10 or more minutes late.  We strive to give you quality time with our providers, and arriving late affects you and other patients whose appointments are after yours.  Also, if you no show three or more times for appointments you may be dismissed from the clinic at the providers discretion.     Again, thank you for choosing [facility]  Our hope is that these requests will decrease the amount of time that you wait before being seen by our physicians.       _____________________________________________________________  Should you have questions after your visit to [facility], please contact our office at [phone] and follow the prompts.  Our office hours are 8:00 a.m. and 4:30 p.m. Monday - Friday.  Please note that voicemails left after 4:00 p.m. may not be returned until the following business day.  We are closed weekends and major holidays.  You do have access to a nurse 24-7, just call the main number to the clinic [phone] and do not press any options, hold on the line and a nurse will answer the phone.    For prescription refill requests, have your pharmacy contact our office and allow 72 hours.    Due to Covid, you will need to wear a  mask upon entering the hospital. If you do not have a mask, a mask will be given to you at the Main Entrance upon arrival. For doctor visits, patients may have 1 support person age 80 or older with them. For treatment visits, patients can not have anyone with them due to social distancing guidelines and our immunocompromised population.

## 2022-03-11 ENCOUNTER — Other Ambulatory Visit (HOSPITAL_COMMUNITY): Payer: Self-pay

## 2022-03-14 ENCOUNTER — Other Ambulatory Visit (HOSPITAL_COMMUNITY): Payer: Self-pay

## 2022-03-21 ENCOUNTER — Other Ambulatory Visit (HOSPITAL_COMMUNITY): Payer: Self-pay

## 2022-04-12 ENCOUNTER — Other Ambulatory Visit (HOSPITAL_COMMUNITY): Payer: Self-pay

## 2022-04-13 ENCOUNTER — Other Ambulatory Visit (HOSPITAL_COMMUNITY): Payer: Self-pay

## 2022-04-21 ENCOUNTER — Other Ambulatory Visit (HOSPITAL_COMMUNITY): Payer: Self-pay

## 2022-04-22 ENCOUNTER — Other Ambulatory Visit (HOSPITAL_COMMUNITY): Payer: Self-pay

## 2022-04-25 ENCOUNTER — Other Ambulatory Visit (HOSPITAL_COMMUNITY): Payer: Self-pay

## 2022-04-26 ENCOUNTER — Other Ambulatory Visit (HOSPITAL_COMMUNITY): Payer: Self-pay | Admitting: Pharmacist

## 2022-04-26 ENCOUNTER — Telehealth (HOSPITAL_COMMUNITY): Payer: Self-pay | Admitting: Pharmacist

## 2022-04-26 ENCOUNTER — Other Ambulatory Visit (HOSPITAL_COMMUNITY): Payer: Self-pay

## 2022-04-26 DIAGNOSIS — C562 Malignant neoplasm of left ovary: Secondary | ICD-10-CM

## 2022-04-26 MED ORDER — ZEJULA 300 MG PO TABS
1.0000 | ORAL_TABLET | Freq: Every day | ORAL | 0 refills | Status: DC
  Filled 2022-04-26: qty 30, fill #0
  Filled 2022-04-26: qty 30, 30d supply, fill #0

## 2022-04-26 NOTE — Telephone Encounter (Signed)
Oral Oncology Pharmacist Encounter   Prior Authorization for Zejula TABLETS has been approved.     Effective dates: 04/26/22 through 09/18/2022   [facility] notified so that they can continue to process the prescription.   Darl Pikes, PharmD, BCPS. BCOP Hematology/Oncology Clinical Pharmacist [facility] Oral Chemotherapy [facility] [phone]  04/26/2022 4:02 PM

## 2022-04-26 NOTE — Telephone Encounter (Signed)
Oral Oncology Pharmacist Encounter   Received notification from [facility] that prior authorization for Zejula TABLETS is required.   PA submitted via telephone/fax on 04/26/22 Status is pending  Of note, patient's DOB with her insurance is [id number]   Oral Oncology Clinic will continue to follow.   Darl Pikes, PharmD, BCPS, [facility] Hematology/Oncology Clinical Pharmacist [facility] Oral [facility] [phone]  04/26/2022 3:12 PM

## 2022-04-27 ENCOUNTER — Other Ambulatory Visit (HOSPITAL_COMMUNITY): Payer: Self-pay

## 2022-04-29 ENCOUNTER — Other Ambulatory Visit (HOSPITAL_COMMUNITY): Payer: Self-pay

## 2022-05-13 ENCOUNTER — Other Ambulatory Visit (HOSPITAL_COMMUNITY): Payer: Self-pay

## 2022-05-16 ENCOUNTER — Other Ambulatory Visit (HOSPITAL_COMMUNITY): Payer: Self-pay

## 2022-05-17 ENCOUNTER — Other Ambulatory Visit (HOSPITAL_COMMUNITY): Payer: Self-pay | Admitting: Hematology

## 2022-05-17 ENCOUNTER — Other Ambulatory Visit: Payer: Self-pay | Admitting: *Deleted

## 2022-05-17 ENCOUNTER — Other Ambulatory Visit (HOSPITAL_COMMUNITY): Payer: Self-pay

## 2022-05-17 DIAGNOSIS — C562 Malignant neoplasm of left ovary: Secondary | ICD-10-CM

## 2022-05-17 MED ORDER — ZEJULA 300 MG PO TABS
1.0000 | ORAL_TABLET | Freq: Every day | ORAL | 0 refills | Status: DC
  Filled 2022-05-17: qty 30, 30d supply, fill #0

## 2022-05-17 NOTE — Telephone Encounter (Signed)
Zejula refill approved.  Patient tolerating and is to continue therapy.

## 2022-05-20 ENCOUNTER — Other Ambulatory Visit (HOSPITAL_COMMUNITY): Payer: Self-pay

## 2022-06-01 ENCOUNTER — Encounter: Payer: Self-pay | Admitting: Gynecologic Oncology

## 2022-06-03 ENCOUNTER — Other Ambulatory Visit: Payer: Self-pay

## 2022-06-03 ENCOUNTER — Inpatient Hospital Stay: Payer: Medicare Other | Attending: Gynecologic Oncology | Admitting: Gynecologic Oncology

## 2022-06-03 VITALS — BP 168/81 | HR 61 | Temp 97.9°F | Resp 16 | Ht 65.98 in | Wt 161.0 lb

## 2022-06-03 DIAGNOSIS — Z9221 Personal history of antineoplastic chemotherapy: Secondary | ICD-10-CM | POA: Insufficient documentation

## 2022-06-03 DIAGNOSIS — C562 Malignant neoplasm of left ovary: Secondary | ICD-10-CM | POA: Diagnosis present

## 2022-06-03 DIAGNOSIS — Z801 Family history of malignant neoplasm of trachea, bronchus and lung: Secondary | ICD-10-CM | POA: Diagnosis not present

## 2022-06-03 DIAGNOSIS — Z90722 Acquired absence of ovaries, bilateral: Secondary | ICD-10-CM | POA: Diagnosis not present

## 2022-06-03 DIAGNOSIS — Z90710 Acquired absence of both cervix and uterus: Secondary | ICD-10-CM | POA: Diagnosis not present

## 2022-06-03 DIAGNOSIS — C786 Secondary malignant neoplasm of retroperitoneum and peritoneum: Secondary | ICD-10-CM | POA: Diagnosis present

## 2022-06-03 DIAGNOSIS — I10 Essential (primary) hypertension: Secondary | ICD-10-CM | POA: Diagnosis not present

## 2022-06-03 DIAGNOSIS — Z79899 Other long term (current) drug therapy: Secondary | ICD-10-CM | POA: Diagnosis not present

## 2022-06-03 DIAGNOSIS — C569 Malignant neoplasm of unspecified ovary: Secondary | ICD-10-CM

## 2022-06-03 DIAGNOSIS — Z809 Family history of malignant neoplasm, unspecified: Secondary | ICD-10-CM | POA: Diagnosis not present

## 2022-06-03 DIAGNOSIS — Z8042 Family history of malignant neoplasm of prostate: Secondary | ICD-10-CM | POA: Insufficient documentation

## 2022-06-03 NOTE — Progress Notes (Signed)
Gynecologic Oncology Return Clinic Visit  06/03/22  Reason for Visit: Surveillance visit in the setting of history of ovarian cancer  Treatment History: Oncology History  Ovarian cancer ([place])  04/25/2015 - 04/28/2015 Hospital Admission   Nausea/diarrhea.  Oncology consult completed on 04/27/2015.   04/25/2015 Tumor Marker   CA 125- 3902.      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   04/27/2015 Procedure   US Paracentesis- A total of approximately 3700 mL of amber colored fluid was removed. A fluid sample was sent for laboratory analysis.   04/27/2015 Imaging   CT Chest- Mild left supraclavicular lymphadenopathy and moderate mediastinal lymphadenopathy involving the prevascular, subcarinal and bilateral pericardiophrenic nodal chains, likely metastatic.   04/27/2015 Pathology Results   Diagnosis PERITONEAL/ASCITIC FLUID(SPECIMEN 1 OF 1 COLLECTED 04/27/15): MALIGNANT CELLS CONSISTENT WITH METASTATIC ADENOCARCINOMA.  The immunophenotype is most consistent with a gynecologic primary, most likely ovary.   05/08/2015 Miscellaneous   Seen by Dr. Denman George- recommending Carboplatin/Paclitaxel x 3 cycles and return visit to see her (within 1 week of administration of third cycle) to evaluate for optimal sequencing of treatment modalities.   05/13/2015 - 05/15/2015 Hospital Admission   Hospitalized for AKI   05/19/2015 - 07/01/2015 Chemotherapy   Carboplatin/Paclitaxel x 3 cycles   07/28/2015 Surgery   Exploratory laparotomy with total abdominal hysterectomy, bilateral salpingo-oophorectomy, omentectomy radical tumor debulking for ovarian cancer; by Dr. Everitt Amber.   07/28/2015 Pathology Results   Uterus +/- tubes/ovaries, neoplastic, cervix - HIGH GRADE SEROUS CARCINOMA INVOLVING LEFT OVARY, LEFT FALLOPIAN TUBE AND RIGHT FALLOPIAN TUBE. - CERVIX,  ENDOMETRIUM AND MYOMETRIUM ARE FREE OF TUMOR. 2. Cul-de-sac biop...   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  The Breast/Ovarian gene panel offered by [facility] includes sequencing and rearrangement analysis for the following 20 genes:  ATM, BARD1, BRCA1, BRCA2, BRIP1, CDH1, CHEK2, EPCAM, FANCC, MLH1, MSH2, MSH6, NBN, PALB2, PMS2, PTEN, RAD51C, RAD51D, TP53, and XRCC2.   The report date is 12/14/2015.  Update:  MSH6 c.2667G>T VUS has been reclassified from a variant of uncertain significance to a likely benign variant based on a combination of sources, e.g., internal data, published literature, population databases and in [place].  The updated report date is October 29, 2019.   12/23/2015 Miscellaneous   Genetic counseling.  Genetic testing was normal, and did not reveal a deleterious mutation in these genes   03/07/2017 Imaging   CT C/A/P IMPRESSION: Chest Impression:   No evidence of metastatic disease in thorax.   Abdomen / Pelvis Impression:   1. Interval increase in size of solitary nodular peritoneal implant in the LEFT upper quadrant . 2. No additional evidence of peritoneal nodularity. 3. No ascites.   07/31/2017 Progression   CT C/A/P: IMPRESSION: 1. Left upper quadrant nodule continues to progress, now measuring 20 x 26 mm. 2. New nodule identified between the in adrenal gland, concerning for metastatic deposit. 3. Interval development of tiny nodules in the left lower quadrant mesenteric, concerning for metastases. 4. No ascites. 5. Status post total abdominal hysterectomy and omentectomy.    Latasha [name] is a very pleasant 81 year old G1 who was originally seen in consultation at the request of Dr Ancil Linsey for (clinical) stage IVB ovarian cancer. The patient developed symptoms  of progressive abdominal fullness and discomfort over the course of  several months. In August, 2016 she presented to the [facility] where imaging of the abdomen and pelvis was obtained for diarrhea, nausea and emesis. Imaging on 04/25/15 revealed large volume ascites, carcinomatosis and peritoneal masses measuring up to 5.1cm. The patient underwent paracentesis with cytology on 04/27/15 which revealed metastatic adenocarcinoma consistent on immunostains with gyn primary.   CT of the chest on 04/27/15 revealed mild left supraclavicular lymphadenopathy, and mediastinal lymphadenopathy consistent with metastatic disease. There were <6m pulmonary nodules also seen which were indeterminant. There were trace pleural effusions seen.    CA 125 on 04/25/15: 3902.   She then went on to receive 3 cycles of paclitaxel and carboplatin chemotherapy with Dr PWhitney Muse Cycle 3 was on 07/01/15.   CA 125 on 07/01/15 was 249.   CT scan on 07/20/15 showed: Previously noted ascites has resolved. No pneumoperitoneum. Many of the previously noted peritoneal implants are no longer confidently identified on today's examination. There are few smaller implants noted, the largest of which is in th  left side of the abdomen anteriorly measuring 9 x 11 mm. There is also a 10 x 7 mm lesion superficial to the splenic flexure of the colon which is substantially smaller than the prior study (previously 2.8 x 1.7 cm).   There was resolution of the chest adenopathy.   She has tolerated chemotherapy well with a good appetites and good general function.   On 07/31/15 she was taken to the OR for an ex lap, TAH, BSO, omentectomy, radical tumor debulking. Intraoperative findings included: Excellent response to neoadjuvant chemotherapy. Small volume plaques/nodules in left and right posterior cul de sac behind uterus, tumor on left ovary (3cm), multiple 2cm nodules in omentum. No other peritoneal disease. Diaphragm normal.    She had an optimal/complete cytoreduction (R0) with no gross visible disease  remaining.  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   Postoperatively she did very well with no postoperative complications.   She went on to receive 3 additional adjuvant cycles of carboplatin and paclitaxel completed April, 2017. She tolerated therapy very well.  Post treatment scans were unremarkable and showed no residual disease.   Her CA 125 was monitored at 3 monthly intervals. In January, 2018 it was normal at 17. On April, 20th ,2018 it had increased to 32. On June 15th, 2018 it had increased again to 47. CT chest/abdo/pelvis on 03/07/17 showed solitary pertoneal nodule in the gastrosplenic ligament measuring 163mx 1622mincreased from a prior scan where it was 1cm). No additional nodularity is noted nor ascites.   She was offered secondary cytoreduction surgery followed by chemotherapy, however she declined this and opted for expectant management because she felt good.   On 07/31/17 repeat CT abdo/pelvis was performed. It showed progression of peritoneal disease with an enlargement of a peri-splenic lesion, a new nodule adjacent to the adrenal gland and a new left lower quadrant peritoneal metastases.   She agreed to commend salvage 2nd line chemotherapy with carboplatin and paclitaxel in December, 2018 due to the onset of symptomatic recurrence. She completed 6 cycles of this on 11/30/17 and was then changed to Niraparib maintenance [id number] starting in March, 2019.   CA 125 was normal at 9 on 03/26/18.  CT scan abd/pelvis in July, 2019 was normal with no evidence of recurrent/progressive disease.  CT scan in December, 2019 was stable with no progression.   CA 125  on 11/15/18 was normal and stable at 6.7. CT scan abd/pelvis on 11/15/18 showed no new progression or recurrence.    CA 125 on 01/22/19 was normal at 6. CA 125 on 03/11/19 was normal at 6.7. CA 125 on 05/07/19 was normal at 6.9. CA 125  On 10/15/19 was normal at 6.2.   CT abd/pelvis  on 07/12/19 showed no evidence of recurrent/progressive disease.   CA 125 on 12/17/19 was normal at 6.7 CT chest/abd/pelvis on 12/17/19 showed no evidence of recurrent disease.  CA 125 on 03/24/20 was normal at 6.2. CA 125 on 10/01/20 was normal at 6.4 CA 125 on 01/08/21 was normal at 6.3.   CT scan on 01/08/21 was normal and showed no evidence of recurrence.   Interval History: Patient reports doing well.  She notes some decreased energy secondary to the heat, but otherwise has been feeling well.  Endorses baseline bowel and bladder function.  Reports having a good appetite without nausea or emesis.  Denies any vaginal bleeding or discharge.  Denies any pelvic or abdominal pain.  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[place])    Schizophrenia ([place])    Ulcerative colitis ([place])    Vitamin D deficiency     Past Surgical History:  Procedure Laterality Date   ABDOMINAL HYSTERECTOMY N/A 07/28/2015   Procedure: TOTAL HYSTERECTOMY ABDOMINAL BILATERAL SALPINGO OOPHRORECTOMY;  Surgeon: Everitt Amber, MD;  Location: [facility];  Service: Gynecology;  Laterality: N/A;   DEBULKING N/A 07/28/2015   Procedure: DEBULKING;  Surgeon: Everitt Amber, MD;  Location: [facility];  Service: Gynecology;  Laterality: N/A;   LAPAROTOMY N/A 07/28/2015   Procedure: EXPLORATORY LAPAROTOMY;  Surgeon: Everitt Amber, MD;  Location: [facility];  Service: Gynecology;  Laterality: N/A;   OMENTECTOMY N/A 07/28/2015   Procedure: OMENTECTOMY ;  Surgeon: Everitt Amber, MD;  Location: [facility];  Service: Gynecology;  Laterality: N/A;   PARACENTESIS  04/27/15   PORTACATH PLACEMENT Right 04/2015   REPLACEMENT TOTAL KNEE     right knee in 2003    Family History  Problem Relation Age of Onset   Congestive Heart  Failure Mother    Seizures Sister    Prostate cancer Brother    Cancer Paternal Uncle        1 uncle with cancer NOS   Lung cancer Maternal Grandmother    Hypertension Maternal Grandfather    Colon cancer Neg Hx    Breast cancer Neg Hx    Ovarian cancer Neg Hx    Endometrial cancer Neg Hx    Pancreatic cancer Neg Hx     Social History   Socioeconomic History   Marital status: Single    Spouse name: Not on file   Number of children: 1   Years of education: Not on file   Highest education level: Not on file  Occupational History   Not on file  Tobacco Use   Smoking status: Never   Smokeless tobacco: Never  Vaping Use   Vaping Use: Never used  Substance and Sexual Activity   Alcohol use: No   Drug use: No   Sexual activity: Not Currently  Other Topics Concern   Not on file  Social History Narrative   Not on file   Social Determinants of Health   Financial Resource Strain: Not on file  Food Insecurity: Not on file  Transportation Needs: Not on file  Physical Activity: Not on file  Stress: Not on file  Social Connections: Not on file    Current Medications:  Current Outpatient Medications:    acetaminophen (TYLENOL) 500 MG tablet, Take 2 tablets (1,000 mg total) by mouth every 12 (twelve) hours., Disp: 30 tablet, Rfl: 0   amLODipine (NORVASC) 10 MG tablet, Take 10 mg by mouth every evening. , Disp: , Rfl:    aspirin EC 81 MG tablet, Take 81 mg by mouth daily., Disp: , Rfl:    cholecalciferol (VITAMIN D) 25 MCG (1000 UNIT) tablet, Take 1,000 Units by mouth daily., Disp: , Rfl:    docusate sodium (COLACE) 100 MG capsule, Take 100 mg by mouth 2 (two) times daily., Disp: , Rfl:    folic acid (FOLVITE) 1 MG tablet, Take 1 mg by mouth daily., Disp: , Rfl:    lisinopril (PRINIVIL,ZESTRIL) 40 MG tablet, Take 40 mg by mouth daily. , Disp: , Rfl: 2   loratadine (CLARITIN) 10 MG tablet, Take 10 mg by mouth daily., Disp: , Rfl:    lovastatin (MEVACOR) 10 MG tablet, Take 10  mg by mouth at bedtime., Disp: , Rfl:    melatonin 5 MG TABS, Take 5 mg by mouth at bedtime., Disp: , Rfl:    metoprolol tartrate (LOPRESSOR) 25 MG tablet, Take 1 tablet (25 mg total) by mouth 2 (two) times daily., Disp: 60 tablet, Rfl: 6   mirtazapine (REMERON) 7.5 MG tablet, Take 7.5 mg by mouth at bedtime., Disp: , Rfl:    Multiple Vitamin (MULTIVITAMIN WITH MINERALS) TABS tablet, Take 1 tablet by mouth daily., Disp: , Rfl:    Niraparib Tosylate (ZEJULA) 300 MG TABS, Take 1 tablet by mouth daily., Disp: 30 tablet, Rfl: 0   ondansetron (ZOFRAN) 8 MG tablet, Take 1 tablet (8 mg total) by mouth 2 (two) times daily as needed for refractory nausea / vomiting. Start on day 3 after chemo., Disp: 30 tablet, Rfl: 1   prochlorperazine (COMPAZINE) 10 MG tablet, Take 1 tablet (10 mg total) by mouth every 6 (six) hours as needed (Nausea or vomiting)., Disp: 60 tablet, Rfl: 3   QUEtiapine (SEROQUEL XR) 300 MG 24 hr tablet, Take 300 mg by mouth at bedtime., Disp: , Rfl:    sulfaSALAzine (AZULFIDINE) 500 MG tablet, Take 500 mg by mouth 3 (three) times daily., Disp: , Rfl:    traMADol (ULTRAM) 50 MG tablet, Take 1 tablet (50 mg total) by mouth as needed., Disp: 60 tablet, Rfl: 0   vitamin B-12 (CYANOCOBALAMIN) 1000 MCG tablet, TAKE 1 TABLET BY MOUTH ONCE DAILY., Disp: 90 tablet, Rfl: 3  Review of Systems: + joint pain Denies appetite changes, fevers, chills, fatigue, unexplained weight changes. Denies hearing loss, neck lumps or masses, mouth sores, ringing in ears or voice changes. Denies cough or wheezing.  Denies shortness of breath. Denies chest pain or palpitations. Denies leg swelling. Denies abdominal distention, pain, blood in stools, constipation, diarrhea, nausea, vomiting, or early satiety. Denies pain with intercourse, dysuria, frequency, hematuria or incontinence. Denies hot flashes, pelvic pain, vaginal bleeding or vaginal discharge.   Denies back pain or muscle pain/cramps. Denies itching,  rash, or wounds. Denies dizziness, headaches, numbness or seizures. Denies swollen lymph nodes or glands, denies easy bruising or bleeding. Denies anxiety, depression, confusion, or decreased concentration.  Physical Exam: BP (!) 175/80 (BP Location: Left Arm, Patient Position: Sitting) Comment: informed Dr Berline Lopes of BP  Pulse 61   Temp 97.9 F (36.6 C) (Oral)   Resp 16   Ht 5' 5.98" (1.676 m)   Wt 161 lb (73 kg)   SpO2 96%   BMI 26.00 kg/m  General: Alert, oriented, no acute distress. HEENT: Normocephalic, atraumatic, sclera anicteric. Chest: Clear to auscultation bilaterally.  No wheezes or rhonchi.  Port site clean and intact. Cardiovascular: Regular rate and rhythm, no murmurs. Abdomen: nontender.  Normoactive bowel sounds.  No masses or hepatosplenomegaly appreciated.  Well-healed scar. Extremities: Grossly normal range of motion.  Warm, well perfused.  No edema bilaterally. Skin: No rashes or lesions noted. Lymphatics: No cervical, supraclavicular, or inguinal adenopathy. GU: Normal appearing external genitalia without erythema, excoriation, or lesions.  Speculum exam reveals some anterior prolapse, cuff intact, no masses.  Bimanual exam reveals cuff smooth, no masses or nodularity.  Rectovaginal exam confirms findings.  Laboratory & Radiologic Studies: CT A/P on 12/30/21: 1. Stable postoperative CT status post hysterectomy and omentectomy. No evidence of local recurrence or metastatic disease. 2. Stable cystocele and pelvic floor laxity. 3. Lumbar spondylosis. 4.  Aortic Atherosclerosis (ICD10-I70.0).  Component Ref Range & Units 3 mo ago (03/01/22) 7 mo ago (10/25/21) 1 yr ago (04/08/21) 1 yr ago (01/08/21) 1 yr ago (10/01/20) 1 yr ago (07/01/20) 1 yr ago (06/05/20)  Cancer Antigen (CA) 125 0.0 - 38.1 U/mL 7.1  6.4 CM  7.5 CM  6.3 CM  6.4 CM  6.6 CM  6.2 CM     Assessment & Plan: Latasha [name] is a 81 y.o. 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 BRCA negative on testing. S/p salvage 2nd line chemotherapy with carb/taxol completed 11/30/17. Now on Niraparib since March, 2019. No measurable disease.  Tolerating maintenance PARPi with Zejula. Recommend continuing this indefinitely or up to five years (2024) as she is tolerating treatment well and is disease free.   Patient is overall doing very well and remains symptom-free.  She is NED on exam today.  Plan has been continued yearly imaging while she is on Zejula.  Last imaging in 12/2021 was negative for disease recurrence.  Blood pressure is high today.  Patient is asymptomatic, denying any vision changes or headache.  Pressure rechecked at the end of her visit, 168/81. Precautions reviewed with her. She has follow-up with her PCP next week.   She and I reviewed signs and symptoms that would be concerning for disease recurrence and I stressed the importance of calling if she develops any of these before her next visit.  Otherwise, I will plan to see her back in 6 months for surveillance visit and exam.  At that time, she will be 5 years out from completion of salvage chemotherapy in the setting of recurrence.   She sees Dr. Delton Coombes in late October. She will get a CA-125 at that time.  20 minutes of total time was spent for this patient encounter, including preparation, face-to-face counseling with the patient and coordination of care, and documentation of the encounter.  Jeral Pinch, MD  Division of Gynecologic Oncology  Department of Obstetrics and Gynecology  [facility]

## 2022-06-03 NOTE — Patient Instructions (Signed)
It was good to see you today.  I do not see or feel any evidence of cancer recurrence on your exam.  I will see you for follow-up in 6 months.  My schedule is not out past the end of the year.  Please call back sometime after the new year to get a visit scheduled with me in March 2024.  As always, if you develop any new and concerning symptoms before your next visit, please call to see me sooner.

## 2022-06-14 ENCOUNTER — Other Ambulatory Visit (HOSPITAL_COMMUNITY): Payer: Self-pay

## 2022-06-15 ENCOUNTER — Other Ambulatory Visit (HOSPITAL_COMMUNITY): Payer: Self-pay

## 2022-06-15 ENCOUNTER — Other Ambulatory Visit: Payer: Self-pay | Admitting: *Deleted

## 2022-06-15 ENCOUNTER — Other Ambulatory Visit (HOSPITAL_COMMUNITY): Payer: Self-pay | Admitting: Hematology

## 2022-06-15 DIAGNOSIS — C562 Malignant neoplasm of left ovary: Secondary | ICD-10-CM

## 2022-06-15 MED ORDER — ZEJULA 300 MG PO TABS
1.0000 | ORAL_TABLET | Freq: Every day | ORAL | 0 refills | Status: DC
  Filled 2022-06-15: qty 30, 30d supply, fill #0

## 2022-06-15 NOTE — Telephone Encounter (Signed)
Refill for Zejula approved. Patient tolerating and is to continue on therapy.

## 2022-06-22 ENCOUNTER — Other Ambulatory Visit (HOSPITAL_COMMUNITY): Payer: Self-pay

## 2022-07-06 ENCOUNTER — Inpatient Hospital Stay: Payer: Medicare Other | Attending: Gynecologic Oncology

## 2022-07-06 DIAGNOSIS — Z79899 Other long term (current) drug therapy: Secondary | ICD-10-CM | POA: Insufficient documentation

## 2022-07-06 DIAGNOSIS — K5900 Constipation, unspecified: Secondary | ICD-10-CM | POA: Diagnosis not present

## 2022-07-06 DIAGNOSIS — C562 Malignant neoplasm of left ovary: Secondary | ICD-10-CM

## 2022-07-06 DIAGNOSIS — Z9221 Personal history of antineoplastic chemotherapy: Secondary | ICD-10-CM | POA: Insufficient documentation

## 2022-07-06 DIAGNOSIS — I10 Essential (primary) hypertension: Secondary | ICD-10-CM | POA: Diagnosis not present

## 2022-07-06 DIAGNOSIS — E782 Mixed hyperlipidemia: Secondary | ICD-10-CM | POA: Diagnosis not present

## 2022-07-06 DIAGNOSIS — C786 Secondary malignant neoplasm of retroperitoneum and peritoneum: Secondary | ICD-10-CM | POA: Insufficient documentation

## 2022-07-06 DIAGNOSIS — Z90710 Acquired absence of both cervix and uterus: Secondary | ICD-10-CM | POA: Insufficient documentation

## 2022-07-06 LAB — CBC WITH DIFFERENTIAL/PLATELET
Abs Immature Granulocytes: 0.01 10*3/uL (ref 0.00–0.07)
Basophils Absolute: 0.1 10*3/uL (ref 0.0–0.1)
Basophils Relative: 1 %
Eosinophils Absolute: 0.3 10*3/uL (ref 0.0–0.5)
Eosinophils Relative: 6 %
HCT: 32.6 % — ABNORMAL LOW (ref 36.0–46.0)
Hemoglobin: 10.9 g/dL — ABNORMAL LOW (ref 12.0–15.0)
Immature Granulocytes: 0 %
Lymphocytes Relative: 19 %
Lymphs Abs: 0.9 10*3/uL (ref 0.7–4.0)
MCH: 36.1 pg — ABNORMAL HIGH (ref 26.0–34.0)
MCHC: 33.4 g/dL (ref 30.0–36.0)
MCV: 107.9 fL — ABNORMAL HIGH (ref 80.0–100.0)
Monocytes Absolute: 0.6 10*3/uL (ref 0.1–1.0)
Monocytes Relative: 12 %
Neutro Abs: 2.8 10*3/uL (ref 1.7–7.7)
Neutrophils Relative %: 62 %
Platelets: 309 10*3/uL (ref 150–400)
RBC: 3.02 MIL/uL — ABNORMAL LOW (ref 3.87–5.11)
RDW: 12.9 % (ref 11.5–15.5)
WBC: 4.6 10*3/uL (ref 4.0–10.5)
nRBC: 0 % (ref 0.0–0.2)

## 2022-07-06 LAB — IRON AND TIBC
Iron: 72 ug/dL (ref 28–170)
Saturation Ratios: 28 % (ref 10.4–31.8)
TIBC: 255 ug/dL (ref 250–450)
UIBC: 183 ug/dL

## 2022-07-06 LAB — COMPREHENSIVE METABOLIC PANEL
ALT: 26 U/L (ref 0–44)
AST: 33 U/L (ref 15–41)
Albumin: 4.4 g/dL (ref 3.5–5.0)
Alkaline Phosphatase: 90 U/L (ref 38–126)
Anion gap: 7 (ref 5–15)
BUN: 13 mg/dL (ref 8–23)
CO2: 27 mmol/L (ref 22–32)
Calcium: 9.3 mg/dL (ref 8.9–10.3)
Chloride: 104 mmol/L (ref 98–111)
Creatinine, Ser: 1.1 mg/dL — ABNORMAL HIGH (ref 0.44–1.00)
GFR, Estimated: 51 mL/min — ABNORMAL LOW (ref >60)
Glucose, Bld: 98 mg/dL (ref 70–99)
Potassium: 3.9 mmol/L (ref 3.5–5.1)
Sodium: 138 mmol/L (ref 135–145)
Total Bilirubin: 0.7 mg/dL (ref 0.3–1.2)
Total Protein: 7.7 g/dL (ref 6.5–8.1)

## 2022-07-06 LAB — FERRITIN: Ferritin: 842 ng/mL — ABNORMAL HIGH (ref 11–307)

## 2022-07-06 LAB — FOLATE: Folate: >40 ng/mL (ref >5.9)

## 2022-07-06 LAB — VITAMIN B12: Vitamin B-12: 870 pg/mL (ref 180–914)

## 2022-07-08 LAB — CA 125: Cancer Antigen (CA) 125: 6.4 U/mL (ref 0.0–38.1)

## 2022-07-13 ENCOUNTER — Encounter: Payer: Self-pay | Admitting: Hematology

## 2022-07-13 ENCOUNTER — Inpatient Hospital Stay (HOSPITAL_BASED_OUTPATIENT_CLINIC_OR_DEPARTMENT_OTHER): Payer: Medicare Other | Admitting: Hematology

## 2022-07-13 VITALS — BP 164/77 | HR 72 | Temp 98.0°F | Resp 18 | Wt 160.9 lb

## 2022-07-13 DIAGNOSIS — C562 Malignant neoplasm of left ovary: Secondary | ICD-10-CM

## 2022-07-13 DIAGNOSIS — C786 Secondary malignant neoplasm of retroperitoneum and peritoneum: Secondary | ICD-10-CM | POA: Diagnosis not present

## 2022-07-13 NOTE — Progress Notes (Signed)
Patient is taking Zejula as prescribed.  She has not missed any doses and reports no side effects at this time.

## 2022-07-13 NOTE — Patient Instructions (Addendum)
[place] at [facility] Discharge Instructions   You were seen and examined today by Dr. Delton Coombes.  He reviewed the results of your lab work which are normal/stable. You cancer tumor marker was normal.   We will see you back in 6 months. We will repeat lab work and a CT scan prior to this visit.     Thank you for choosing [place] at [facility] to provide your oncology and hematology care.  To afford each patient quality time with our provider, please arrive at least 15 minutes before your scheduled appointment time.   If you have a lab appointment with the [facility] please come in thru the Main Entrance and check in at the main information desk.  You need to re-schedule your appointment should you arrive 10 or more minutes late.  We strive to give you quality time with our providers, and arriving late affects you and other patients whose appointments are after yours.  Also, if you no show three or more times for appointments you may be dismissed from the clinic at the providers discretion.     Again, thank you for choosing [facility].  Our hope is that these requests will decrease the amount of time that you wait before being seen by our physicians.       _____________________________________________________________  Should you have questions after your visit to [facility], please contact our office at [phone] and follow the prompts.  Our office hours are 8:00 a.m. and 4:30 p.m. Monday - Friday.  Please note that voicemails left after 4:00 p.m. may not be returned until the following business day.  We are closed weekends and major holidays.  You do have access to a nurse 24-7, just call the main number to the clinic [phone] and do not press any options, hold on the line and a nurse will answer the phone.    For prescription refill requests, have your pharmacy contact our office and allow 72 hours.     Due to Covid, you will need to wear a mask upon entering the hospital. If you do not have a mask, a mask will be given to you at the Main Entrance upon arrival. For doctor visits, patients may have 1 support person age 50 or older with them. For treatment visits, patients can not have anyone with them due to social distancing guidelines and our immunocompromised population.

## 2022-07-13 NOTE — Progress Notes (Signed)
Latasha [name] [address] [name]   CLINIC:  Medical Oncology/Hematology  PCP:  Abran Richard, MD [address] [place] / [place] [address] [phone]   REASON FOR VISIT:  Follow-up for left ovarian cancer  PRIOR THERAPY:  1. Carboplatin and paclitaxel x 3 cycles from 05/19/2015 to 07/01/2015, then 3 more cycles through 10/07/2015. Afterwards, x 6 cycles from 08/17/2017 to 11/30/2017. 2. Debulking surgery with TAH-BSO on 08/07/2015.  NGS Results: not done  CURRENT THERAPY: Zejula 300 mg daily  BRIEF ONCOLOGIC HISTORY:  Oncology History  Ovarian cancer ([facility])  04/25/2015 - 04/28/2015 Hospital Admission   Nausea/diarrhea.  Oncology consult completed on 04/27/2015.   04/25/2015 Tumor Marker   CA 125- 3902.      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   04/27/2015 Procedure   US Paracentesis- A total of approximately 3700 mL of amber colored fluid was removed. A fluid sample was sent for laboratory analysis.   04/27/2015 Imaging   CT Chest- Mild left supraclavicular lymphadenopathy and moderate mediastinal lymphadenopathy involving the prevascular, subcarinal and bilateral pericardiophrenic nodal chains, likely metastatic.   04/27/2015 Pathology Results   Diagnosis PERITONEAL/ASCITIC FLUID(SPECIMEN 1 OF 1 COLLECTED 04/27/15): MALIGNANT CELLS CONSISTENT WITH METASTATIC ADENOCARCINOMA.  The immunophenotype is most consistent with a gynecologic primary, most likely ovary.   05/08/2015 Miscellaneous   Seen by Dr. Denman George- recommending Carboplatin/Paclitaxel x 3 cycles and return visit to see her (within 1 week of administration of third cycle) to evaluate for optimal sequencing of treatment modalities.   05/13/2015 - 05/15/2015 Hospital Admission   Hospitalized for AKI   05/19/2015 - 07/01/2015  Chemotherapy   Carboplatin/Paclitaxel x 3 cycles   07/28/2015 Surgery   Exploratory laparotomy with total abdominal hysterectomy, bilateral salpingo-oophorectomy, omentectomy radical tumor debulking for ovarian cancer; by Dr. Everitt Amber.   07/28/2015 Pathology Results   Uterus +/- tubes/ovaries, neoplastic, cervix - HIGH GRADE SEROUS CARCINOMA INVOLVING LEFT OVARY, LEFT FALLOPIAN TUBE AND RIGHT FALLOPIAN TUBE. - CERVIX, ENDOMETRIUM AND MYOMETRIUM ARE FREE OF TUMOR. 2. Cul-de-sac biop...   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  The Breast/Ovarian gene panel offered by [facility] includes sequencing and rearrangement analysis for the following 20 genes:  ATM, BARD1, BRCA1, BRCA2, BRIP1, CDH1, CHEK2, EPCAM, FANCC, MLH1, MSH2, MSH6, NBN, PALB2, PMS2, PTEN, RAD51C, RAD51D, TP53, and XRCC2.   The report date is 12/14/2015.  Update:  MSH6 c.2667G>T VUS has been reclassified from a variant of uncertain significance to a likely benign variant based on a combination of sources, e.g., internal data, published literature, population databases and in [place].  The updated report date is October 29, 2019.   12/23/2015 Miscellaneous   Genetic counseling.  Genetic testing was normal, and did not reveal a deleterious mutation in these genes   03/07/2017 Imaging   CT C/A/P IMPRESSION: Chest Impression:   No evidence of metastatic disease in thorax.   Abdomen / Pelvis Impression:   1. Interval increase in size of solitary nodular peritoneal implant in the LEFT upper quadrant . 2. No additional evidence of peritoneal nodularity. 3. No ascites.   07/31/2017 Progression   CT C/A/P: IMPRESSION: 1. Left upper quadrant nodule continues to progress, now measuring 20 x 26 mm. 2.  New nodule identified between the in adrenal gland, concerning for metastatic deposit. 3. Interval development of  tiny nodules in the left lower quadrant mesenteric, concerning for metastases. 4. No ascites. 5. Status post total abdominal hysterectomy and omentectomy.     CANCER STAGING:  Cancer Staging  Ovarian cancer [facility]) Staging form: Ovary, AJCC 7th Edition - Clinical stage from 04/27/2015: FIGO Stage IVB, calculated as Stage IV (T3c, N1, M1) - Signed by Baird Cancer, PA-C on 09/16/2015 - Pathologic stage from 05/08/2015: FIGO Stage IVB, calculated as Stage IV (TX, N1, M1) - Signed by Everitt Amber, MD on 05/08/2015 - Pathologic stage from 07/28/2015: FIGO Stage IV (T3c, NX, M1) - Signed by Baird Cancer, PA-C on 09/16/2015   INTERVAL HISTORY:  [name] [name], a 81 y.o. female, seen for follow-up of ovarian cancer.  Reports energy levels of 100%.  Constipation is well controlled with Colace.  Does not report any abdominal pains.  REVIEW OF SYSTEMS:  Review of Systems  Constitutional:  Negative for appetite change, fatigue and unexpected weight change.  Gastrointestinal:  Positive for constipation. Negative for abdominal pain, diarrhea, nausea and vomiting.  All other systems reviewed and are negative.   PAST MEDICAL/SURGICAL HISTORY:  Past Medical History:  Diagnosis Date   Arthritis    B12 deficiency    Cataract    History of blood transfusion    History of chemotherapy    Hypertension    Iron deficiency anemia    Mixed hyperlipidemia    Ovarian cancer ([place])    Regional enteritis ([place])    Schizophrenia ([place])    Ulcerative colitis ([place])    Vitamin D deficiency    Past Surgical History:  Procedure Laterality Date   ABDOMINAL HYSTERECTOMY N/A 07/28/2015   Procedure: TOTAL HYSTERECTOMY ABDOMINAL BILATERAL SALPINGO OOPHRORECTOMY;  Surgeon: Everitt Amber, MD;  Location: [facility];  Service: Gynecology;  Laterality: N/A;   DEBULKING N/A 07/28/2015   Procedure: DEBULKING;  Surgeon: Everitt Amber, MD;  Location: [facility];  Service: Gynecology;  Laterality: N/A;   LAPAROTOMY N/A 07/28/2015    Procedure: EXPLORATORY LAPAROTOMY;  Surgeon: Everitt Amber, MD;  Location: [facility];  Service: Gynecology;  Laterality: N/A;   OMENTECTOMY N/A 07/28/2015   Procedure: OMENTECTOMY ;  Surgeon: Everitt Amber, MD;  Location: [facility];  Service: Gynecology;  Laterality: N/A;   PARACENTESIS  04/27/15   PORTACATH PLACEMENT Right 04/2015   REPLACEMENT TOTAL KNEE     right knee in 2003    SOCIAL HISTORY:  Social History   Socioeconomic History   Marital status: Single    Spouse name: Not on file   Number of children: 1   Years of education: Not on file   Highest education level: Not on file  Occupational History   Not on file  Tobacco Use   Smoking status: Never   Smokeless tobacco: Never  Vaping Use   Vaping Use: Never used  Substance and Sexual Activity   Alcohol use: No   Drug use: No   Sexual activity: Not Currently  Other Topics Concern   Not on file  Social History Narrative   Not on file   Social Determinants of Health   Financial Resource Strain: Not on file  Food Insecurity: Not on file  Transportation Needs: Not on file  Physical Activity: Not on file  Stress: Not on file  Social Connections: Not on file  Intimate Partner Violence: Not on file    FAMILY HISTORY:  Family History  Problem Relation Age of Onset   Congestive Heart Failure Mother    Seizures Sister    Prostate cancer Brother    Cancer Paternal Uncle        1 uncle with cancer NOS   Lung cancer Maternal Grandmother    Hypertension Maternal Grandfather    Colon cancer Neg Hx    Breast cancer Neg Hx    Ovarian cancer Neg Hx    Endometrial cancer Neg Hx    Pancreatic cancer Neg Hx     CURRENT MEDICATIONS:  Current Outpatient Medications  Medication Sig Dispense Refill   acetaminophen (TYLENOL) 500 MG tablet Take 2 tablets (1,000 mg total) by mouth every 12 (twelve) hours. 30 tablet 0   amLODipine (NORVASC) 10 MG tablet Take 10 mg by mouth every evening.      aspirin EC 81 MG tablet Take 81 mg by mouth  daily.     cholecalciferol (VITAMIN D) 25 MCG (1000 UNIT) tablet Take 1,000 Units by mouth daily.     docusate sodium (COLACE) 100 MG capsule Take 100 mg by mouth 2 (two) times daily.     folic acid (FOLVITE) 1 MG tablet Take 1 mg by mouth daily.     lisinopril (PRINIVIL,ZESTRIL) 40 MG tablet Take 40 mg by mouth daily.   2   loratadine (CLARITIN) 10 MG tablet Take 10 mg by mouth daily.     lovastatin (MEVACOR) 10 MG tablet Take 10 mg by mouth at bedtime.     melatonin 5 MG TABS Take 5 mg by mouth at bedtime.     metoprolol tartrate (LOPRESSOR) 25 MG tablet Take 1 tablet (25 mg total) by mouth 2 (two) times daily. 60 tablet 6   mirtazapine (REMERON) 7.5 MG tablet Take 7.5 mg by mouth at bedtime.     Multiple Vitamin (MULTIVITAMIN WITH MINERALS) TABS tablet Take 1 tablet by mouth daily.     niraparib tosylate (ZEJULA) 300 MG tablet Take 1 tablet by mouth daily. 30 tablet 0   ondansetron (ZOFRAN) 8 MG tablet Take 1 tablet (8 mg total) by mouth 2 (two) times daily as needed for refractory nausea / vomiting. Start on day 3 after chemo. 30 tablet 1   prochlorperazine (COMPAZINE) 10 MG tablet Take 1 tablet (10 mg total) by mouth every 6 (six) hours as needed (Nausea or vomiting). 60 tablet 3   QUEtiapine (SEROQUEL XR) 300 MG 24 hr tablet Take 300 mg by mouth at bedtime.     sulfaSALAzine (AZULFIDINE) 500 MG tablet Take 500 mg by mouth 3 (three) times daily.     traMADol (ULTRAM) 50 MG tablet Take 1 tablet (50 mg total) by mouth as needed. 60 tablet 0   vitamin B-12 (CYANOCOBALAMIN) 1000 MCG tablet TAKE 1 TABLET BY MOUTH ONCE DAILY. 90 tablet 3   No current facility-administered medications for this visit.    ALLERGIES:  No Known Allergies  PHYSICAL EXAM:  Performance status (ECOG): 1 - Symptomatic but completely ambulatory  Vitals:   07/13/22 1140 07/13/22 1146  BP: (!) 173/83 (!) 164/77  Pulse: 77 72  Resp: 18   Temp: 98 F (36.7 C)   SpO2: 96%    Wt Readings from Last 3 Encounters:   07/13/22 160 lb 15 oz (73 kg)  06/03/22 161 lb (73 kg)  03/08/22 161 lb 13.1 oz (73.4 kg)   Physical Exam   LABORATORY DATA:  I have reviewed the labs as listed.     Latest Ref Rng &  Units 07/06/2022   10:49 AM 03/01/2022   10:13 AM 10/25/2021   10:43 AM  CBC  WBC 4.0 - 10.5 K/uL 4.6  4.5  5.7   Hemoglobin 12.0 - 15.0 g/dL 10.9  10.6  10.6   Hematocrit 36.0 - 46.0 % 32.6  31.9  31.8   Platelets 150 - 400 K/uL 309  300  285       Latest Ref Rng & Units 07/06/2022   10:49 AM 03/01/2022   10:13 AM 12/27/2021   11:04 AM  CMP  Glucose 70 - 99 mg/dL 98  92  101   BUN 8 - 23 mg/dL _0 Creatinine 0.44 - 1.00 mg/dL 1.10  1.08  1.36   Sodium 135 - 145 mmol/L 138  140  138   Potassium 3.5 - 5.1 mmol/L 3.9  4.1  4.5   Chloride 98 - 111 mmol/L 104  106  104   CO2 22 - 32 mmol/L _1 Calcium 8.9 - 10.3 mg/dL 9.3  9.3  9.5   Total Protein 6.5 - 8.1 g/dL 7.7  7.3    Total Bilirubin 0.3 - 1.2 mg/dL 0.7  0.6    Alkaline Phos 38 - 126 U/L 90  84    AST 15 - 41 U/L 33  32    ALT 0 - 44 U/L 26  25      DIAGNOSTIC IMAGING:  I have independently reviewed the scans and discussed with the patient. No results found.   ASSESSMENT:  1.  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 -3 cycles of carboplatin and paclitaxel followed by debulking surgery by Dr. Denman George on 08/07/2015 followed by 3 more cycles completed on 10/07/2015. -6 cycles of carboplatin and paclitaxel from 08/17/2017 through 11/30/2017 in the second line setting. -Germline mutation testing was negative. -Maintenance Niraparib 300 mg daily started in April 2019.  She was recommended to continue therapy indefinitely until progression by Dr. Denman George. -CTCAP on 12/17/2019 shows stable exam with no evidence of focal recurrence or metastatic disease.  Pelvic floor laxity with cystocele. -CTAP with contrast on 07/01/2020 with no residual or recurrent tumor or metastatic disease.   PLAN:   1.  Stage IVb ovarian cancer: - She was previously evaluated by Dr. Berline Lopes on 11/26/2021 and was recommended to continue niraparib indefinitely for up to 5 years (2024) as she is tolerating well. - CTAP on 12/30/2021 with no evidence of recurrence or metastatic disease. - Last CA125 was 6.4.  Labs show normal LFTs and electrolytes.  CBC shows mild microcytic anemia likely from niraparib.  Ferritin, I33 and folic acid are within normal limits. - Continue niraparib 300 mg daily.  Recommend follow-up in 4 months with repeat CT scan and labs.   2.  Elevated creatinine: - Creatinine is 1.10, likely from niraparib and stable.   3.  Back pain and joint pains: - Continue tramadol 50 mg daily as needed.  I have given a refill.   4.  Hypertension: - Continue lisinopril and metoprolol.  Repeat blood pressure was 825 systolic.  Reports that blood pressure is well controlled at home.   Orders placed this encounter:  Orders Placed This Encounter  Procedures   CT Abdomen Pelvis W Contrast   CBC with Differential   Comprehensive metabolic panel   Magnesium   [place], MD [place] [phone]  I, Thana Ates, am acting as a Education administrator for Dr. Derek Jack.  I, Derek Jack MD, have reviewed the above documentation for accuracy and completeness, and I agree with the above.

## 2022-07-14 ENCOUNTER — Other Ambulatory Visit (HOSPITAL_COMMUNITY): Payer: Self-pay

## 2022-07-18 ENCOUNTER — Other Ambulatory Visit (HOSPITAL_COMMUNITY): Payer: Self-pay | Admitting: Hematology

## 2022-07-18 ENCOUNTER — Other Ambulatory Visit (HOSPITAL_COMMUNITY): Payer: Self-pay

## 2022-07-18 DIAGNOSIS — C562 Malignant neoplasm of left ovary: Secondary | ICD-10-CM

## 2022-07-18 MED ORDER — NIRAPARIB TOSYLATE 300 MG PO TABS
300.0000 mg | ORAL_TABLET | Freq: Every day | ORAL | 4 refills | Status: DC
  Filled 2022-07-18: qty 30, 30d supply, fill #0
  Filled 2022-08-16: qty 30, 30d supply, fill #1
  Filled 2022-09-13 – 2022-09-29 (×3): qty 30, 30d supply, fill #2
  Filled 2022-10-20: qty 30, 30d supply, fill #3
  Filled 2022-11-16: qty 30, 30d supply, fill #4

## 2022-07-25 ENCOUNTER — Other Ambulatory Visit (HOSPITAL_COMMUNITY): Payer: Self-pay

## 2022-08-09 ENCOUNTER — Other Ambulatory Visit: Payer: Self-pay | Admitting: Hematology

## 2022-08-09 DIAGNOSIS — C562 Malignant neoplasm of left ovary: Secondary | ICD-10-CM

## 2022-08-16 ENCOUNTER — Other Ambulatory Visit (HOSPITAL_COMMUNITY): Payer: Self-pay

## 2022-08-23 ENCOUNTER — Other Ambulatory Visit (HOSPITAL_COMMUNITY): Payer: Self-pay

## 2022-09-13 ENCOUNTER — Other Ambulatory Visit (HOSPITAL_COMMUNITY): Payer: Self-pay

## 2022-09-20 ENCOUNTER — Other Ambulatory Visit: Payer: Self-pay

## 2022-09-20 ENCOUNTER — Other Ambulatory Visit (HOSPITAL_COMMUNITY): Payer: Self-pay

## 2022-09-21 ENCOUNTER — Other Ambulatory Visit (HOSPITAL_COMMUNITY): Payer: Self-pay

## 2022-09-22 ENCOUNTER — Other Ambulatory Visit (HOSPITAL_COMMUNITY): Payer: Self-pay

## 2022-09-23 ENCOUNTER — Other Ambulatory Visit (HOSPITAL_COMMUNITY): Payer: Self-pay

## 2022-09-26 ENCOUNTER — Other Ambulatory Visit (HOSPITAL_COMMUNITY): Payer: Self-pay

## 2022-09-27 ENCOUNTER — Other Ambulatory Visit (HOSPITAL_COMMUNITY): Payer: Self-pay

## 2022-09-27 ENCOUNTER — Other Ambulatory Visit: Payer: Self-pay

## 2022-09-28 ENCOUNTER — Other Ambulatory Visit (HOSPITAL_COMMUNITY): Payer: Self-pay

## 2022-09-29 ENCOUNTER — Other Ambulatory Visit (HOSPITAL_COMMUNITY): Payer: Self-pay

## 2022-10-03 ENCOUNTER — Other Ambulatory Visit (HOSPITAL_COMMUNITY): Payer: Self-pay

## 2022-10-04 ENCOUNTER — Other Ambulatory Visit (HOSPITAL_COMMUNITY): Payer: Self-pay

## 2022-10-13 ENCOUNTER — Other Ambulatory Visit (HOSPITAL_COMMUNITY): Payer: Self-pay

## 2022-10-20 ENCOUNTER — Other Ambulatory Visit (HOSPITAL_COMMUNITY): Payer: Self-pay

## 2022-10-26 ENCOUNTER — Other Ambulatory Visit: Payer: Self-pay

## 2022-11-07 ENCOUNTER — Inpatient Hospital Stay: Payer: Medicare Other | Attending: Hematology

## 2022-11-07 ENCOUNTER — Ambulatory Visit (HOSPITAL_COMMUNITY)
Admission: RE | Admit: 2022-11-07 | Discharge: 2022-11-07 | Disposition: A | Discharge status: Home / Self Care | Payer: Medicare Other | Source: Ambulatory Visit | Attending: Hematology | Admitting: Hematology

## 2022-11-07 DIAGNOSIS — C786 Secondary malignant neoplasm of retroperitoneum and peritoneum: Secondary | ICD-10-CM | POA: Diagnosis present

## 2022-11-07 DIAGNOSIS — Z9221 Personal history of antineoplastic chemotherapy: Secondary | ICD-10-CM | POA: Diagnosis not present

## 2022-11-07 DIAGNOSIS — C562 Malignant neoplasm of left ovary: Secondary | ICD-10-CM | POA: Insufficient documentation

## 2022-11-07 DIAGNOSIS — E782 Mixed hyperlipidemia: Secondary | ICD-10-CM | POA: Insufficient documentation

## 2022-11-07 DIAGNOSIS — Z79899 Other long term (current) drug therapy: Secondary | ICD-10-CM | POA: Diagnosis not present

## 2022-11-07 DIAGNOSIS — I10 Essential (primary) hypertension: Secondary | ICD-10-CM | POA: Diagnosis not present

## 2022-11-07 LAB — CBC WITH DIFFERENTIAL/PLATELET
Abs Immature Granulocytes: 0.01 10*3/uL (ref 0.00–0.07)
Basophils Absolute: 0.1 10*3/uL (ref 0.0–0.1)
Basophils Relative: 1 %
Eosinophils Absolute: 0.3 10*3/uL (ref 0.0–0.5)
Eosinophils Relative: 5 %
HCT: 33 % — ABNORMAL LOW (ref 36.0–46.0)
Hemoglobin: 10.9 g/dL — ABNORMAL LOW (ref 12.0–15.0)
Immature Granulocytes: 0 %
Lymphocytes Relative: 29 %
Lymphs Abs: 1.4 10*3/uL (ref 0.7–4.0)
MCH: 35.6 pg — ABNORMAL HIGH (ref 26.0–34.0)
MCHC: 33 g/dL (ref 30.0–36.0)
MCV: 107.8 fL — ABNORMAL HIGH (ref 80.0–100.0)
Monocytes Absolute: 0.6 10*3/uL (ref 0.1–1.0)
Monocytes Relative: 13 %
Neutro Abs: 2.4 10*3/uL (ref 1.7–7.7)
Neutrophils Relative %: 52 %
Platelets: 294 10*3/uL (ref 150–400)
RBC: 3.06 MIL/uL — ABNORMAL LOW (ref 3.87–5.11)
RDW: 12.6 % (ref 11.5–15.5)
WBC: 4.7 10*3/uL (ref 4.0–10.5)
nRBC: 0 % (ref 0.0–0.2)

## 2022-11-07 LAB — COMPREHENSIVE METABOLIC PANEL
ALT: 30 U/L (ref 0–44)
AST: 35 U/L (ref 15–41)
Albumin: 4.4 g/dL (ref 3.5–5.0)
Alkaline Phosphatase: 81 U/L (ref 38–126)
Anion gap: 8 (ref 5–15)
BUN: 12 mg/dL (ref 8–23)
CO2: 28 mmol/L (ref 22–32)
Calcium: 9.2 mg/dL (ref 8.9–10.3)
Chloride: 102 mmol/L (ref 98–111)
Creatinine, Ser: 1.11 mg/dL — ABNORMAL HIGH (ref 0.44–1.00)
GFR, Estimated: 51 mL/min — ABNORMAL LOW (ref >60)
Glucose, Bld: 96 mg/dL (ref 70–99)
Potassium: 3.8 mmol/L (ref 3.5–5.1)
Sodium: 138 mmol/L (ref 135–145)
Total Bilirubin: 0.5 mg/dL (ref 0.3–1.2)
Total Protein: 7.5 g/dL (ref 6.5–8.1)

## 2022-11-07 LAB — MAGNESIUM: Magnesium: 2.1 mg/dL (ref 1.7–2.4)

## 2022-11-07 MED ORDER — IOHEXOL 300 MG/ML  SOLN
100.0000 mL | Freq: Once | INTRAMUSCULAR | Status: AC | PRN
  Administered 2022-11-07: 100 mL via INTRAVENOUS

## 2022-11-09 LAB — CA 125: Cancer Antigen (CA) 125: 5.8 U/mL (ref 0.0–38.1)

## 2022-11-14 ENCOUNTER — Other Ambulatory Visit: Payer: Self-pay | Admitting: *Deleted

## 2022-11-14 ENCOUNTER — Inpatient Hospital Stay (HOSPITAL_BASED_OUTPATIENT_CLINIC_OR_DEPARTMENT_OTHER): Payer: Medicare Other | Admitting: Hematology

## 2022-11-14 VITALS — BP 148/80 | HR 80 | Temp 98.4°F | Resp 18 | Ht 69.0 in | Wt 159.5 lb

## 2022-11-14 DIAGNOSIS — D508 Other iron deficiency anemias: Secondary | ICD-10-CM

## 2022-11-14 DIAGNOSIS — C562 Malignant neoplasm of left ovary: Secondary | ICD-10-CM | POA: Diagnosis not present

## 2022-11-14 DIAGNOSIS — D529 Folate deficiency anemia, unspecified: Secondary | ICD-10-CM

## 2022-11-14 MED ORDER — TRAMADOL HCL 50 MG PO TABS: 50.0000 mg | ORAL_TABLET | Freq: Every day | ORAL | 0 refills | Status: DC | PRN

## 2022-11-14 NOTE — Progress Notes (Signed)
[facility] [address], [place] [address]    Clinic Day:  11/14/2022  Referring physician: Abran Richard, MD  Patient Care Team: Latasha Richard, MD as PCP - General (Internal Medicine)   ASSESSMENT & PLAN:   Assessment: 1.  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 -3 cycles of carboplatin and paclitaxel followed by debulking surgery by Dr. Denman [name] on 08/07/2015 followed by 3 more cycles completed on 10/07/2015. -6 cycles of carboplatin and paclitaxel from 08/17/2017 through 11/30/2017 in the second line setting. -Germline mutation testing was negative. -Maintenance Niraparib 300 mg daily started in April 2019.  She was recommended to continue therapy indefinitely until progression by Dr. Denman [name]. -CTCAP on 12/17/2019 shows stable exam with no evidence of focal recurrence or metastatic disease.  Pelvic floor laxity with cystocele. -CTAP with contrast on 07/01/2020 with no residual or recurrent tumor or metastatic disease  Plan: 1.  Stage IVb ovarian [name]: - CTAP (11/07/2022): No evidence of metastatic disease in the abdomen or pelvis.  No evidence of local recurrence. - Labs from 11/07/2022 shows CA125 is 5.8.  LFTs are normal. - CBC shows mild microcytic anemia likely from niraparib.  Hemoglobin is 10.9.  Last ferritin was 842. - She was previously seen by Dr. Berline [name].  Niraparib was recommended for a total of 5 years or indefinitely based on excellent tolerance. - She does not report any side effects from neuropathy.  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  RTC 4 months for follow-up with repeat labs. - Recommend to continue follow-up with Dr. Berline [name] every 6 months.   2.  Elevated creatinine: - Creatinine is 1.11, likely from niraparib and stable.   3.  Back pain and joint pains: - Continue tramadol 50 mg daily as needed.  I have given a refill.   4.  Hypertension: - Continue lisinopril and metoprolol.  Blood pressure  today is 148/80.  No orders of the defined types were placed in this encounter.     Latasha [name],acting as a scribe for Latasha Jack, MD.,have documented all relevant documentation on the behalf of Latasha Jack, MD,as directed by  Latasha Jack, MD while in the presence of Latasha Jack, MD.   I, Latasha Jack MD, have reviewed the above documentation for accuracy and completeness, and I agree with the above.   Latasha [name]   2/26/202411:39 AM  CHIEF COMPLAINT:   Diagnosis: left ovarian [name]    [name] Staging  Ovarian [name] [facility]) Staging form: Ovary, AJCC 7th Edition - Clinical stage from 04/27/2015: FIGO Stage IVB, calculated as Stage IV (T3c, N1, M1) - Signed by Latasha Cancer, PA-C on 09/16/2015 - Pathologic stage from 05/08/2015: FIGO Stage IVB, calculated as Stage IV (TX, N1, M1) - Signed by Latasha Amber, MD on 05/08/2015 - Pathologic stage from 07/28/2015: FIGO Stage IV (T3c, NX, M1) - Signed by Latasha Cancer, PA-C on 09/16/2015    Prior Therapy: 1. Carboplatin and paclitaxel x 3 cycles from 05/19/2015 to 07/01/2015, then 3 more cycles through 10/07/2015. Afterwards, x 6 cycles from 08/17/2017 to 11/30/2017. 2. Debulking surgery with TAH-BSO on 08/07/2015.    Current Therapy:  Zejula 300 mg daily    HISTORY OF PRESENT ILLNESS:   Oncology History  Ovarian [name] ([place])  04/25/2015 - 04/28/2015 Hospital Admission   Nausea/diarrhea.  Oncology consult completed on 04/27/2015.   04/25/2015 Tumor Marker   CA 125- 3902.      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   04/27/2015 Procedure   US Paracentesis- A total of approximately 3700 mL of [name] colored fluid was removed. A fluid sample was sent for laboratory analysis.   04/27/2015 Imaging   CT Chest- Mild left  supraclavicular lymphadenopathy and moderate mediastinal lymphadenopathy involving the prevascular, subcarinal and bilateral pericardiophrenic nodal chains, likely metastatic.   04/27/2015 Pathology Results   Diagnosis PERITONEAL/ASCITIC FLUID(SPECIMEN 1 OF 1 COLLECTED 04/27/15): MALIGNANT CELLS CONSISTENT WITH METASTATIC ADENOCARCINOMA.  The immunophenotype is most consistent with a gynecologic primary, most likely ovary.   05/08/2015 Miscellaneous   Seen by Dr. Denman [name]- recommending Carboplatin/Paclitaxel x 3 cycles and return visit to see her (within 1 week of administration of third cycle) to evaluate for optimal sequencing of treatment modalities.   05/13/2015 - 05/15/2015 Hospital Admission   Hospitalized for AKI   05/19/2015 - 07/01/2015 Chemotherapy   Carboplatin/Paclitaxel x 3 cycles   07/28/2015 Surgery   Exploratory laparotomy with total abdominal hysterectomy, bilateral salpingo-oophorectomy, omentectomy radical tumor debulking for ovarian [name]; by Dr. Everitt [name].   07/28/2015 Pathology Results   Uterus +/- tubes/ovaries, neoplastic, cervix - HIGH GRADE SEROUS CARCINOMA INVOLVING LEFT OVARY, LEFT FALLOPIAN TUBE AND RIGHT FALLOPIAN TUBE. - CERVIX, ENDOMETRIUM AND MYOMETRIUM ARE FREE OF TUMOR. 2. Cul-de-sac biop...   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  The Breast/Ovarian gene panel offered by [facility] includes sequencing and rearrangement analysis for the following 20 genes:  ATM, BARD1, BRCA1, BRCA2, BRIP1, CDH1, CHEK2, EPCAM, FANCC, MLH1, MSH2, MSH6, NBN, PALB2, PMS2, PTEN, RAD51C, RAD51D, TP53, and XRCC2.   The report date is 12/14/2015.  Update:  MSH6 c.2667G>T VUS has been reclassified from a variant of uncertain significance to a likely benign variant based on a combination of sources, e.g., internal data, published literature, population databases and in [place].   The updated report date is October 29, 2019.   12/23/2015 Miscellaneous   Genetic counseling.  Genetic testing was normal, and did not reveal a deleterious mutation in these genes   03/07/2017 Imaging   CT C/A/P IMPRESSION: Chest Impression:   No evidence of metastatic disease in thorax.   Abdomen / Pelvis Impression:   1. Interval increase in size of solitary nodular peritoneal implant in the LEFT upper quadrant . 2. No additional evidence of peritoneal nodularity. 3. No ascites.   07/31/2017 Progression   CT C/A/P: IMPRESSION: 1. Left upper quadrant nodule continues to progress, now measuring 20 x 26 mm. 2. New nodule identified between the in adrenal gland, concerning for metastatic deposit. 3. Interval development of tiny nodules in the left lower quadrant mesenteric, concerning for metastases. 4. No ascites. 5. Status post total abdominal hysterectomy and omentectomy.      INTERVAL HISTORY:   Latasha [name] is a 82 y.o. female presenting to clinic today for follow up of left ovarian [name] . She was last seen by me on 03/08/2022.  Today, she states that she is doing well overall. Her appetite level is at 90%. Her energy level is at 70%. She has 5/10 back pain. She continues to take niraparib daily and denies any problems. Her blood pressure at home is doing fine.  Denies any diarrhea or abdominal cramping.   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[place])    Schizophrenia ([place])    Ulcerative colitis ([place])    Vitamin D deficiency     Surgical History: Past Surgical History:  Procedure Laterality Date   ABDOMINAL HYSTERECTOMY N/A 07/28/2015   Procedure: TOTAL HYSTERECTOMY ABDOMINAL BILATERAL SALPINGO  OOPHRORECTOMY;  Surgeon: Latasha Amber, MD;  Location: [facility];  Service: Gynecology;  Laterality: N/A;   DEBULKING N/A 07/28/2015   Procedure: DEBULKING;  Surgeon: Latasha Amber, MD;  Location: [facility];  Service: Gynecology;  Laterality: N/A;   LAPAROTOMY N/A 07/28/2015   Procedure: EXPLORATORY LAPAROTOMY;  Surgeon: Latasha Amber, MD;  Location: [facility];  Service: Gynecology;  Laterality: N/A;   OMENTECTOMY N/A 07/28/2015   Procedure: OMENTECTOMY ;  Surgeon: Latasha Amber, MD;  Location: [facility];  Service: Gynecology;  Laterality: N/A;   PARACENTESIS  04/27/15   PORTACATH PLACEMENT Right 04/2015   REPLACEMENT TOTAL KNEE     right knee in 2003    Social History: Social History   Socioeconomic History   Marital status: Single    Spouse name: Not on file   Number of children: 1   Years of education: Not on file   Highest education level: Not on file  Occupational History   Not on file  Tobacco Use   Smoking status: Never   Smokeless tobacco: Never  Vaping Use   Vaping Use: Never used  Substance and Sexual Activity   Alcohol use: No   Drug use: No   Sexual activity: Not Currently  Other Topics Concern   Not on file  Social History Narrative   Not on file   Social Determinants of Health   Financial Resource Strain: Not on file  Food Insecurity: Not on file  Transportation Needs: Not on file  Physical Activity: Not on file  Stress: Not on file  Social Connections: Not on file  Intimate Partner Violence: Not on file    Family History: Family History  Problem Relation Age of Onset   Congestive Heart Failure Mother    Seizures Sister    Prostate [name] Brother    [name] Paternal Uncle        1 uncle with [name] NOS   Lung [name] Maternal Grandmother    Hypertension Maternal Grandfather    Colon [name] Neg Hx    Breast [name] Neg Hx    Ovarian [name] Neg Hx    Endometrial [name] Neg Hx    Pancreatic [name] Neg Hx     Current Medications:  Current Outpatient Medications:     acetaminophen (TYLENOL) 500 MG tablet, Take 2 tablets (1,000 mg total) by mouth every 12 (twelve) hours., Disp: 30 tablet, Rfl: 0   amLODipine (NORVASC) 10 MG tablet, Take 10 mg by mouth every evening. , Disp: , Rfl:    aspirin EC 81 MG tablet, Take 81 mg by mouth daily., Disp: , Rfl:    cholecalciferol (VITAMIN D) 25 MCG (1000 UNIT) tablet, Take 1,000 Units by mouth daily., Disp: , Rfl:    docusate sodium (COLACE) 100 MG capsule, Take 100 mg by mouth 2 (two) times daily., Disp: , Rfl:    folic acid (FOLVITE) 1 MG tablet, Take 1 mg by mouth daily.,  Disp: , Rfl:    lisinopril (PRINIVIL,ZESTRIL) 40 MG tablet, Take 40 mg by mouth daily. , Disp: , Rfl: 2   loratadine (CLARITIN) 10 MG tablet, Take 10 mg by mouth daily., Disp: , Rfl:    lovastatin (MEVACOR) 10 MG tablet, Take 10 mg by mouth at bedtime., Disp: , Rfl:    melatonin 5 MG TABS, Take 5 mg by mouth at bedtime., Disp: , Rfl:    metoprolol tartrate (LOPRESSOR) 25 MG tablet, Take 1 tablet (25 mg total) by mouth 2 (two) times daily., Disp: 60 tablet, Rfl: 6   mirtazapine (REMERON) 7.5 MG tablet, Take 7.5 mg by mouth at bedtime., Disp: , Rfl:    Multiple Vitamin (MULTIVITAMIN WITH MINERALS) TABS tablet, Take 1 tablet by mouth daily., Disp: , Rfl:    niraparib tosylate (ZEJULA) 300 MG tablet, Take 1 tablet by mouth daily., Disp: 30 tablet, Rfl: 4   ondansetron (ZOFRAN) 8 MG tablet, Take 1 tablet (8 mg total) by mouth 2 (two) times daily as needed for refractory nausea / vomiting. Start on day 3 after chemo., Disp: 30 tablet, Rfl: 1   prochlorperazine (COMPAZINE) 10 MG tablet, Take 1 tablet (10 mg total) by mouth every 6 (six) hours as needed (Nausea or vomiting)., Disp: 60 tablet, Rfl: 3   QUEtiapine (SEROQUEL XR) 300 MG 24 hr tablet, Take 300 mg by mouth at bedtime., Disp: , Rfl:    sulfaSALAzine (AZULFIDINE) 500 MG tablet, Take 500 mg by mouth 3 (three) times daily., Disp: , Rfl:    traMADol (ULTRAM) 50 MG tablet, TAKE (1) TABLET BY MOUTH ONCE  DAILY AS NEEDED., Disp: 60 tablet, Rfl: 0   vitamin B-12 (CYANOCOBALAMIN) 1000 MCG tablet, TAKE 1 TABLET BY MOUTH ONCE DAILY., Disp: 90 tablet, Rfl: 3   Allergies: No Known Allergies  REVIEW OF SYSTEMS:   Review of Systems  Constitutional:  Negative for chills, fatigue and fever.  HENT:   Negative for lump/mass, mouth sores, nosebleeds, sore throat and trouble swallowing.   Eyes:  Negative for eye problems.  Respiratory:  Negative for cough and shortness of breath.   Cardiovascular:  Negative for chest pain, leg swelling and palpitations.  Gastrointestinal:  Negative for abdominal pain, constipation, diarrhea, nausea and vomiting.  Genitourinary:  Negative for bladder incontinence, difficulty urinating, dysuria, frequency, hematuria and nocturia.   Musculoskeletal:  Negative for arthralgias, back pain, flank pain, myalgias and neck pain.  Skin:  Negative for itching and rash.  Neurological:  Negative for dizziness, headaches and numbness.  Hematological:  Does not bruise/bleed easily.  Psychiatric/Behavioral:  Positive for depression (On Meds). Negative for sleep disturbance and suicidal ideas. The patient is nervous/anxious (On Meds).   All other systems reviewed and are negative.    VITALS:   Blood pressure (!) 148/80, pulse 80, temperature 98.4 F (36.9 C), temperature source Oral, resp. rate 18, height '5\' 9"'$  (1.753 m), weight 159 lb 8 oz (72.3 kg), SpO2 99 %.  Wt Readings from Last 3 Encounters:  11/14/22 159 lb 8 oz (72.3 kg)  07/13/22 160 lb 15 oz (73 kg)  06/03/22 161 lb (73 kg)    Body mass index is 23.55 kg/m.  Performance status (ECOG): 1 - Symptomatic but completely ambulatory  PHYSICAL EXAM:   Physical Exam Vitals and nursing note reviewed. Exam conducted with a chaperone present.  Constitutional:      Appearance: Normal appearance.  Cardiovascular:     Rate and Rhythm: Normal rate and regular rhythm.  Pulses: Normal pulses.     Heart sounds: Normal heart  sounds.  Pulmonary:     Effort: Pulmonary effort is normal.     Breath sounds: Normal breath sounds.  Abdominal:     Palpations: Abdomen is soft. There is no hepatomegaly, splenomegaly or mass.     Tenderness: There is no abdominal tenderness.  Musculoskeletal:     Right lower leg: No edema.     Left lower leg: No edema.  Lymphadenopathy:     Cervical: No cervical adenopathy.     Right cervical: No superficial, deep or posterior cervical adenopathy.    Left cervical: No superficial, deep or posterior cervical adenopathy.     Upper Body:     Right upper body: No supraclavicular or axillary adenopathy.     Left upper body: No supraclavicular or axillary adenopathy.  Neurological:     General: No focal deficit present.     Mental Status: She is alert and oriented to person, place, and time.  Psychiatric:        Mood and Affect: Mood normal.        Behavior: Behavior normal.     LABS:      Latest Ref Rng & Units 11/07/2022   11:41 AM 07/06/2022   10:49 AM 03/01/2022   10:13 AM  CBC  WBC 4.0 - 10.5 K/uL 4.7  4.6  4.5   Hemoglobin 12.0 - 15.0 g/dL 10.9  10.9  10.6   Hematocrit 36.0 - 46.0 % 33.0  32.6  31.9   Platelets 150 - 400 K/uL 294  309  300       Latest Ref Rng & Units 11/07/2022   11:41 AM 07/06/2022   10:49 AM 03/01/2022   10:13 AM  CMP  Glucose 70 - 99 mg/dL 96  98  92   BUN 8 - 23 mg/dL '12  13  9   '$ Creatinine 0.44 - 1.00 mg/dL 1.11  1.10  1.08   Sodium 135 - 145 mmol/L 138  138  140   Potassium 3.5 - 5.1 mmol/L 3.8  3.9  4.1   Chloride 98 - 111 mmol/L 102  104  106   CO2 22 - 32 mmol/L '28  27  29   '$ Calcium 8.9 - 10.3 mg/dL 9.2  9.3  9.3   Total Protein 6.5 - 8.1 g/dL 7.5  7.7  7.3   Total Bilirubin 0.3 - 1.2 mg/dL 0.5  0.7  0.6   Alkaline Phos 38 - 126 U/L 81  90  84   AST 15 - 41 U/L 35  33  32   ALT 0 - 44 U/L '30  26  25      '$ Lab Results  Component Value Date   CEA 0.5 04/26/2015   /  CEA  Date Value Ref Range Status  04/26/2015 0.5 0.0 - 4.7  ng/mL Final    Comment:    (NOTE)       Roche ECLIA methodology       Nonsmokers  <3.9                                     Smokers     <5.6 Performed At: [facility] [place], [place] [id number] Lindon Romp MD [id number]    No results found for: "PSA1" No results found for: "[id number]" Lab Results  Component Value Date  [id number] 5.8 11/07/2022    Lab Results  Component Value Date   TOTALPROTELP 7.1 08/26/2015   ALBUMINELP 4.0 08/26/2015   A1GS 0.2 08/26/2015   A2GS 0.7 08/26/2015   BETS 1.0 08/26/2015   GAMS 1.2 08/26/2015   MSPIKE Not Observed 08/26/2015   SPEI Comment 08/26/2015   Lab Results  Component Value Date   TIBC 255 07/06/2022   TIBC 241 (L) 03/01/2022   TIBC 235 (L) 06/10/2015   FERRITIN 842 (H) 07/06/2022   FERRITIN 748 (H) 03/01/2022   FERRITIN 1,338 (H) 03/03/2017   IRONPCTSAT 28 07/06/2022   IRONPCTSAT 36 (H) 03/01/2022   IRONPCTSAT 65 (H) 06/10/2015   Lab Results  Component Value Date   LDH 219 (H) 03/24/2020   LDH 198 (H) 11/15/2018   LDH 185 08/13/2018     STUDIES:   CT Abdomen Pelvis W Contrast  Result Date: 11/08/2022 CLINICAL DATA:  Left ovarian [name] status post TAH-BSO 07/28/2015 with omentectomy and debulking. Restaging. * Tracking Code: BO * EXAM: CT ABDOMEN AND PELVIS WITH CONTRAST TECHNIQUE: Multidetector CT imaging of the abdomen and pelvis was performed using the standard protocol following bolus administration of intravenous contrast. RADIATION DOSE REDUCTION: This exam was performed according to the departmental dose-optimization program which includes automated exposure control, adjustment of the mA and/or kV according to patient size and/or use of iterative reconstruction technique. CONTRAST:  151m OMNIPAQUE IOHEXOL 300 MG/ML  SOLN COMPARISON:  12/30/2021 CT abdomen/pelvis. FINDINGS: Lower chest: No acute abnormality at the lung bases. Hepatobiliary: Normal liver size. No liver mass. Normal gallbladder  with no radiopaque cholelithiasis. No biliary ductal dilatation. Pancreas: Normal, with no mass or duct dilation. Spleen: Normal size. No mass. Adrenals/Urinary Tract: Normal adrenals. Normal kidneys with no hydronephrosis and no renal mass. Normal bladder. Stomach/Bowel: Normal non-distended stomach. Normal caliber small bowel with no small bowel wall thickening. Normal appendix. Normal large bowel with no diverticulosis, large bowel wall thickening or pericolonic fat stranding. Vascular/Lymphatic: Atherosclerotic nonaneurysmal abdominal aorta. Patent portal, splenic, hepatic and renal veins. No pathologically enlarged lymph nodes in the abdomen or pelvis. Reproductive: Status post hysterectomy, with no abnormal findings at the vaginal cuff. No adnexal mass. Other: No pneumoperitoneum, ascites or focal fluid collection. No discrete peritoneal nodularity. Musculoskeletal: No aggressive appearing focal osseous lesions. 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 IMPRESSION: 1. No evidence of metastatic disease in the abdomen or pelvis. No evidence of local tumor recurrence in the pelvis. 2.  Aortic Atherosclerosis (ICD10-I70.0). Electronically Signed   By: JIlona SorrelM.D.   On: 11/08/2022 11:04

## 2022-11-14 NOTE — Patient Instructions (Addendum)
[place] at [facility] Discharge Instructions   You were seen and examined today by Dr. Delton Coombes.  He reviewed the results of your lab work which are normal/stable. Your tumor marker is normal.   He reviewed the results of your CT scan which was normal. There is no evidence of cancer.   Continue Zejula as prescribed.   Return as scheduled.    Thank you for choosing [place] at [facility] to provide your oncology and hematology care.  To afford each patient quality time with our provider, please arrive at least 15 minutes before your scheduled appointment time.   If you have a lab appointment with the [facility] please come in thru the Main Entrance and check in at the main information desk.  You need to re-schedule your appointment should you arrive 10 or more minutes late.  We strive to give you quality time with our providers, and arriving late affects you and other patients whose appointments are after yours.  Also, if you no show three or more times for appointments you may be dismissed from the clinic at the providers discretion.     Again, thank you for choosing [facility].  Our hope is that these requests will decrease the amount of time that you wait before being seen by our physicians.       _____________________________________________________________  Should you have questions after your visit to [facility], please contact our office at [phone] and follow the prompts.  Our office hours are 8:00 a.m. and 4:30 p.m. Monday - Friday.  Please note that voicemails left after 4:00 p.m. may not be returned until the following business day.  We are closed weekends and major holidays.  You do have access to a nurse 24-7, just call the main number to the clinic [phone] and do not press any options, hold on the line and a nurse will answer the phone.    For prescription refill requests, have your  pharmacy contact our office and allow 72 hours.    Due to Covid, you will need to wear a mask upon entering the hospital. If you do not have a mask, a mask will be given to you at the Main Entrance upon arrival. For doctor visits, patients may have 1 support person age 80 or older with them. For treatment visits, patients can not have anyone with them due to social distancing guidelines and our immunocompromised population.

## 2022-11-15 ENCOUNTER — Other Ambulatory Visit (HOSPITAL_COMMUNITY): Payer: Self-pay

## 2022-11-16 ENCOUNTER — Other Ambulatory Visit (HOSPITAL_COMMUNITY): Payer: Self-pay

## 2022-11-22 ENCOUNTER — Other Ambulatory Visit (HOSPITAL_COMMUNITY): Payer: Self-pay

## 2022-11-23 ENCOUNTER — Other Ambulatory Visit (HOSPITAL_COMMUNITY): Payer: Self-pay

## 2022-11-24 ENCOUNTER — Telehealth: Payer: Self-pay

## 2022-11-24 ENCOUNTER — Other Ambulatory Visit (HOSPITAL_COMMUNITY): Payer: Self-pay

## 2022-11-24 NOTE — Telephone Encounter (Signed)
Ms.Wayson called office stating she was supposed to call for a follow up appointment in March with Dr. Berline Lopes.  Appointment made for 01/12/23 with Dr. Berline Lopes  Pt agrees to date and time

## 2022-12-15 ENCOUNTER — Other Ambulatory Visit (HOSPITAL_COMMUNITY): Payer: Self-pay

## 2022-12-19 ENCOUNTER — Other Ambulatory Visit (HOSPITAL_COMMUNITY): Payer: Self-pay

## 2022-12-20 ENCOUNTER — Other Ambulatory Visit: Payer: Self-pay

## 2022-12-20 ENCOUNTER — Other Ambulatory Visit (HOSPITAL_COMMUNITY): Payer: Self-pay

## 2022-12-20 ENCOUNTER — Other Ambulatory Visit: Payer: Self-pay | Admitting: Hematology

## 2022-12-20 ENCOUNTER — Other Ambulatory Visit: Payer: Self-pay | Admitting: *Deleted

## 2022-12-20 DIAGNOSIS — C562 Malignant neoplasm of left ovary: Secondary | ICD-10-CM

## 2022-12-20 MED ORDER — NIRAPARIB TOSYLATE 300 MG PO TABS
300.0000 mg | ORAL_TABLET | Freq: Every day | ORAL | 4 refills | Status: DC
  Filled 2022-12-20: qty 30, 30d supply, fill #0
  Filled 2023-01-18: qty 30, 30d supply, fill #1
  Filled 2023-02-16: qty 30, 30d supply, fill #2
  Filled 2023-03-16: qty 30, 30d supply, fill #3
  Filled 2023-04-13: qty 30, 30d supply, fill #4

## 2022-12-20 NOTE — Telephone Encounter (Signed)
Zejula refill approved.  Patient is tolerating and is to continue therapy.

## 2022-12-21 ENCOUNTER — Other Ambulatory Visit: Payer: Self-pay

## 2022-12-29 ENCOUNTER — Other Ambulatory Visit (HOSPITAL_COMMUNITY): Payer: Self-pay

## 2023-01-06 ENCOUNTER — Ambulatory Visit: Payer: Medicare Other | Admitting: Gynecologic Oncology

## 2023-01-10 ENCOUNTER — Encounter: Payer: Self-pay | Admitting: Gynecologic Oncology

## 2023-01-12 ENCOUNTER — Encounter: Payer: Self-pay | Admitting: Gynecologic Oncology

## 2023-01-12 ENCOUNTER — Inpatient Hospital Stay: Payer: Medicare Other | Attending: Hematology | Admitting: Gynecologic Oncology

## 2023-01-12 ENCOUNTER — Other Ambulatory Visit: Payer: Self-pay

## 2023-01-12 VITALS — BP 160/78 | HR 89 | Wt 157.0 lb

## 2023-01-12 DIAGNOSIS — C562 Malignant neoplasm of left ovary: Secondary | ICD-10-CM

## 2023-01-12 DIAGNOSIS — Z90722 Acquired absence of ovaries, bilateral: Secondary | ICD-10-CM | POA: Diagnosis not present

## 2023-01-12 DIAGNOSIS — C786 Secondary malignant neoplasm of retroperitoneum and peritoneum: Secondary | ICD-10-CM | POA: Diagnosis present

## 2023-01-12 DIAGNOSIS — Z79899 Other long term (current) drug therapy: Secondary | ICD-10-CM | POA: Diagnosis not present

## 2023-01-12 DIAGNOSIS — Z90710 Acquired absence of both cervix and uterus: Secondary | ICD-10-CM | POA: Diagnosis not present

## 2023-01-12 DIAGNOSIS — Z9221 Personal history of antineoplastic chemotherapy: Secondary | ICD-10-CM | POA: Diagnosis not present

## 2023-01-12 NOTE — Patient Instructions (Signed)
It was good to see you today.  I do not see or feel any evidence of cancer recurrence on your exam.  I will see you for follow-up in December.  I am putting an order in to get your port removed.  As always, if you develop any new and concerning symptoms before your next visit, please call to see me sooner.

## 2023-01-12 NOTE — Progress Notes (Signed)
Gynecologic Oncology Return Clinic Visit  01/12/23  Reason for Visit: Surveillance visit in the setting of history of ovarian cancer   Treatment History: Oncology History  Ovarian cancer  04/25/2015 - 04/28/2015 Hospital Admission   Nausea/diarrhea.  Oncology consult completed on 04/27/2015.   04/25/2015 Tumor Marker   CA 125- 3902.      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   04/27/2015 Procedure   US Paracentesis- A total of approximately 3700 mL of amber colored fluid was removed. A fluid sample was sent for laboratory analysis.   04/27/2015 Imaging   CT Chest- Mild left supraclavicular lymphadenopathy and moderate mediastinal lymphadenopathy involving the prevascular, subcarinal and bilateral pericardiophrenic nodal chains, likely metastatic.   04/27/2015 Pathology Results   Diagnosis PERITONEAL/ASCITIC FLUID(SPECIMEN 1 OF 1 COLLECTED 04/27/15): MALIGNANT CELLS CONSISTENT WITH METASTATIC ADENOCARCINOMA.  The immunophenotype is most consistent with a gynecologic primary, most likely ovary.   05/08/2015 Miscellaneous   Seen by Dr. Andrey Farmer- recommending Carboplatin/Paclitaxel x 3 cycles and return visit to see her (within 1 week of administration of third cycle) to evaluate for optimal sequencing of treatment modalities.   05/13/2015 - 05/15/2015 Hospital Admission   Hospitalized for AKI   05/19/2015 - 07/01/2015 Chemotherapy   Carboplatin/Paclitaxel x 3 cycles   07/28/2015 Surgery   Exploratory laparotomy with total abdominal hysterectomy, bilateral salpingo-oophorectomy, omentectomy radical tumor debulking for ovarian cancer; by Dr. Adolphus Birchwood.   07/28/2015 Pathology Results   Uterus +/- tubes/ovaries, neoplastic, cervix - HIGH GRADE SEROUS CARCINOMA INVOLVING LEFT OVARY, LEFT FALLOPIAN TUBE AND RIGHT FALLOPIAN TUBE. - CERVIX, ENDOMETRIUM  AND MYOMETRIUM ARE FREE OF TUMOR. 2. Cul-de-sac biop...   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  The Breast/Ovarian gene panel offered by [facility] includes sequencing and rearrangement analysis for the following 20 genes:  ATM, BARD1, BRCA1, BRCA2, BRIP1, CDH1, CHEK2, EPCAM, FANCC, MLH1, MSH2, MSH6, NBN, PALB2, PMS2, PTEN, RAD51C, RAD51D, TP53, and XRCC2.   The report date is 12/14/2015.  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  The updated report date is October 29, 2019.   12/23/2015 Miscellaneous   Genetic counseling.  Genetic testing was normal, and did not reveal a deleterious mutation in these genes   03/07/2017 Imaging   CT C/A/P IMPRESSION: Chest Impression:   No evidence of metastatic disease in thorax.   Abdomen / Pelvis Impression:   1. Interval increase in size of solitary nodular peritoneal implant in the LEFT upper quadrant . 2. No additional evidence of peritoneal nodularity. 3. No ascites.   07/31/2017 Progression   CT C/A/P: IMPRESSION: 1. Left upper quadrant nodule continues to progress, now measuring 20 x 26 mm. 2. New nodule identified between the in adrenal gland, concerning for metastatic deposit. 3. Interval development of tiny nodules in the left lower quadrant mesenteric, concerning for metastases. 4. No ascites. 5. Status post total abdominal hysterectomy and omentectomy.     Latasha [name] is a very pleasant 82 year old G1 who was originally seen in consultation at the request of Dr Loma Messing for (clinical) stage IVB ovarian cancer. The patient developed  symptoms of progressive abdominal fullness and discomfort over the course of several  months. In August, 2016 she presented to the [facility] ER where imaging of the abdomen and pelvis was obtained for diarrhea, nausea and emesis. Imaging on 04/25/15 revealed large volume ascites, carcinomatosis and peritoneal masses measuring up to 5.1cm. The patient underwent paracentesis with cytology on 04/27/15 which revealed metastatic adenocarcinoma consistent on immunostains with gyn primary.   CT of the chest on 04/27/15 revealed mild left supraclavicular lymphadenopathy, and mediastinal lymphadenopathy consistent with metastatic disease. There were <15mm pulmonary nodules also seen which were indeterminant. There were trace pleural effusions seen.    CA 125 on 04/25/15: 3902.   She then went on to receive 3 cycles of paclitaxel and carboplatin chemotherapy with Dr Galen Manila. Cycle 3 was on 07/01/15.   CA 125 on 07/01/15 was 249.   CT scan on 07/20/15 showed: Previously noted ascites has resolved. No pneumoperitoneum. Many of the previously noted peritoneal implants are no longer confidently identified on today's examination. There are few smaller implants noted, the largest of which is in th  left side of the abdomen anteriorly measuring 9 x 11 mm. There is also a 10 x 7 mm lesion superficial to the splenic flexure of the colon which is substantially smaller than the prior study (previously 2.8 x 1.7 cm).   There was resolution of the chest adenopathy.   She has tolerated chemotherapy well with a good appetites and good general function.   On 07/31/15 she was taken to the OR for an ex lap, TAH, BSO, omentectomy, radical tumor debulking. Intraoperative findings included: Excellent response to neoadjuvant chemotherapy. Small volume plaques/nodules in left and right posterior cul de sac behind uterus, tumor on left ovary (3cm), multiple 2cm nodules in omentum. No other peritoneal disease. Diaphragm normal.    She had an optimal/complete cytoreduction (R0) with no gross visible disease remaining.   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   Postoperatively she did very well with no postoperative complications.   She went on to receive 3 additional adjuvant cycles of carboplatin and paclitaxel completed April, 2017. She tolerated therapy very well.  Post treatment scans were unremarkable and showed no residual disease.   Her CA 125 was monitored at 3 monthly intervals. In January, 2018 it was normal at 17. On April, 20th ,2018 it had increased to 32. On June 15th, 2018 it had increased again to 47. CT chest/abdo/pelvis on 03/07/17 showed solitary pertoneal nodule in the gastrosplenic ligament measuring 18mm x 16mm (increased from a prior scan where it was 1cm). No additional nodularity is noted nor ascites.   She was offered secondary cytoreduction surgery followed by chemotherapy, however she declined this and opted for expectant management because she felt good.   On 07/31/17 repeat CT abdo/pelvis was performed. It showed progression of peritoneal disease with an enlargement of a peri-splenic lesion, a new nodule adjacent to the adrenal gland and a new left lower quadrant peritoneal metastases.   She agreed to commend salvage 2nd line chemotherapy with carboplatin and paclitaxel in December, 2018 due to the onset of symptomatic recurrence. She completed 6 cycles of this on 11/30/17 and was then changed to Niraparib maintenance 300mg  daily starting in March, 2019.   CA 125 was normal at 9 on 03/26/18.  CT scan abd/pelvis in July, 2019 was normal with no evidence of recurrent/progressive disease.  CT scan in December, 2019 was stable with no progression.   CA  125 on 11/15/18 was normal and stable at 6.7. CT scan abd/pelvis on 11/15/18 showed no new progression or recurrence.    CA 125 on 01/22/19 was normal at 6. CA 125 on 03/11/19 was normal at 6.7. CA 125 on 05/07/19 was normal at 6.9. CA 125  On 10/15/19 was normal at 6.2.   CT abd/pelvis on  07/12/19 showed no evidence of recurrent/progressive disease.   CA 125 on 12/17/19 was normal at 6.7 CT chest/abd/pelvis on 12/17/19 showed no evidence of recurrent disease.  CA 125 on 03/24/20 was normal at 6.2. CA 125 on 10/01/20 was normal at 6.4 CA 125 on 01/08/21 was normal at 6.3.   CT scan on 01/08/21 was normal and showed no evidence of recurrence.   Interval History: Doing well.  Denies any abdominal or pelvic pain.  Denies any vaginal bleeding or discharge.  Reports baseline bowel bladder function.  Has been feeling very well.  Past Medical/Surgical History: Past Medical History:  Diagnosis Date   Arthritis    B12 deficiency    Cataract    History of blood transfusion    History of chemotherapy    Hypertension    Iron deficiency anemia    Mixed hyperlipidemia    Ovarian cancer    Regional enteritis    Schizophrenia    Ulcerative colitis    Vitamin D deficiency     Past Surgical History:  Procedure Laterality Date   ABDOMINAL HYSTERECTOMY N/A 07/28/2015   Procedure: TOTAL HYSTERECTOMY ABDOMINAL BILATERAL SALPINGO OOPHRORECTOMY;  Surgeon: Adolphus Birchwood, MD;  Location: [facility];  Service: Gynecology;  Laterality: N/A;   DEBULKING N/A 07/28/2015   Procedure: DEBULKING;  Surgeon: Adolphus Birchwood, MD;  Location: [facility];  Service: Gynecology;  Laterality: N/A;   LAPAROTOMY N/A 07/28/2015   Procedure: EXPLORATORY LAPAROTOMY;  Surgeon: Adolphus Birchwood, MD;  Location: [facility];  Service: Gynecology;  Laterality: N/A;   OMENTECTOMY N/A 07/28/2015   Procedure: OMENTECTOMY ;  Surgeon: Adolphus Birchwood, MD;  Location: [facility];  Service: Gynecology;  Laterality: N/A;   PARACENTESIS  04/27/15   PORTACATH PLACEMENT Right 04/2015   REPLACEMENT TOTAL KNEE     right knee in 2003    Family History  Problem Relation Age of Onset   Congestive Heart Failure Mother    Seizures Sister    Prostate cancer Brother    Cancer Paternal Uncle        1 uncle with cancer NOS   Lung cancer Maternal Grandmother     Hypertension Maternal Grandfather    Colon cancer Neg Hx    Breast cancer Neg Hx    Ovarian cancer Neg Hx    Endometrial cancer Neg Hx    Pancreatic cancer Neg Hx     Social History   Socioeconomic History   Marital status: Single    Spouse name: Not on file   Number of children: 1   Years of education: Not on file   Highest education level: Not on file  Occupational History   Not on file  Tobacco Use   Smoking status: Never   Smokeless tobacco: Never  Vaping Use   Vaping Use: Never used  Substance and Sexual Activity   Alcohol use: No   Drug use: No   Sexual activity: Not Currently  Other Topics Concern   Not on file  Social History Narrative   Not on file   Social Determinants of Health   Financial Resource Strain: Not on file  Food Insecurity: Not  on file  Transportation Needs: Not on file  Physical Activity: Not on file  Stress: Not on file  Social Connections: Not on file    Current Medications:  Current Outpatient Medications:    acetaminophen (TYLENOL) 500 MG tablet, Take 2 tablets (1,000 mg total) by mouth every 12 (twelve) hours., Disp: 30 tablet, Rfl: 0   amLODipine (NORVASC) 10 MG tablet, Take 10 mg by mouth every evening. , Disp: , Rfl:    aspirin EC 81 MG tablet, Take 81 mg by mouth daily., Disp: , Rfl:    cholecalciferol (VITAMIN D) 25 MCG (1000 UNIT) tablet, Take 1,000 Units by mouth daily., Disp: , Rfl:    docusate sodium (COLACE) 100 MG capsule, Take 100 mg by mouth 2 (two) times daily., Disp: , Rfl:    folic acid (FOLVITE) 1 MG tablet, Take 1 mg by mouth daily., Disp: , Rfl:    lisinopril (PRINIVIL,ZESTRIL) 40 MG tablet, Take 40 mg by mouth daily. , Disp: , Rfl: 2   loratadine (CLARITIN) 10 MG tablet, Take 10 mg by mouth daily., Disp: , Rfl:    lovastatin (MEVACOR) 10 MG tablet, Take 10 mg by mouth at bedtime., Disp: , Rfl:    melatonin 5 MG TABS, Take 5 mg by mouth at bedtime., Disp: , Rfl:    metoprolol tartrate (LOPRESSOR) 25 MG tablet, Take  1 tablet (25 mg total) by mouth 2 (two) times daily., Disp: 60 tablet, Rfl: 6   mirtazapine (REMERON) 7.5 MG tablet, Take 7.5 mg by mouth at bedtime., Disp: , Rfl:    Multiple Vitamin (MULTIVITAMIN WITH MINERALS) TABS tablet, Take 1 tablet by mouth daily., Disp: , Rfl:    niraparib tosylate (ZEJULA) 300 MG tablet, Take 1 tablet by mouth daily., Disp: 30 tablet, Rfl: 4   ondansetron (ZOFRAN) 8 MG tablet, Take 1 tablet (8 mg total) by mouth 2 (two) times daily as needed for refractory nausea / vomiting. Start on day 3 after chemo., Disp: 30 tablet, Rfl: 1   prochlorperazine (COMPAZINE) 10 MG tablet, Take 1 tablet (10 mg total) by mouth every 6 (six) hours as needed (Nausea or vomiting)., Disp: 60 tablet, Rfl: 3   QUEtiapine (SEROQUEL XR) 300 MG 24 hr tablet, Take 300 mg by mouth at bedtime., Disp: , Rfl:    sulfaSALAzine (AZULFIDINE) 500 MG tablet, Take 500 mg by mouth 3 (three) times daily., Disp: , Rfl:    traMADol (ULTRAM) 50 MG tablet, Take 1 tablet (50 mg total) by mouth daily as needed., Disp: 60 tablet, Rfl: 0   vitamin B-12 (CYANOCOBALAMIN) 1000 MCG tablet, TAKE 1 TABLET BY MOUTH ONCE DAILY., Disp: 90 tablet, Rfl: 3  Review of Systems: Denies appetite changes, fevers, chills, fatigue, unexplained weight changes. Denies hearing loss, neck lumps or masses, mouth sores, ringing in ears or voice changes. Denies cough or wheezing.  Denies shortness of breath. Denies chest pain or palpitations. Denies leg swelling. Denies abdominal distention, pain, blood in stools, constipation, diarrhea, nausea, vomiting, or early satiety. Denies pain with intercourse, dysuria, frequency, hematuria or incontinence. Denies hot flashes, pelvic pain, vaginal bleeding or vaginal discharge.   Denies joint pain, back pain or muscle pain/cramps. Denies itching, rash, or wounds. Denies dizziness, headaches, numbness or seizures. Denies swollen lymph nodes or glands, denies easy bruising or bleeding. Denies anxiety,  depression, confusion, or decreased concentration.  Physical Exam: BP (!) 171/66 (BP Location: Left Arm, Patient Position: Sitting) Comment: informed Dr Pricilla Holm; will do recheck  Pulse 89   Wt  157 lb (71.2 kg)   SpO2 96%   BMI 23.18 kg/m  General: Alert, oriented, no acute distress. HEENT: Normocephalic, atraumatic, sclera anicteric. Chest: Clear to auscultation bilaterally.  No wheezes or rhonchi.  Port site clean and intact. Cardiovascular: Regular rate and rhythm, no murmurs. Abdomen: nontender.  Normoactive bowel sounds.  No masses or hepatosplenomegaly appreciated.  Well-healed scar. Extremities: Grossly normal range of motion.  Warm, well perfused.  No edema bilaterally. Skin: No rashes or lesions noted. Lymphatics: No cervical, supraclavicular, or inguinal adenopathy. GU: Normal appearing external genitalia without erythema, excoriation, or lesions.  Speculum exam reveals some anterior prolapse, cuff intact, no masses.  Bimanual exam reveals cuff smooth, no masses or nodularity.  Rectovaginal exam confirms findings.  Laboratory & Radiologic Studies:          Component Ref Range & Units 2 mo ago (11/07/22) 6 mo ago (07/06/22) 10 mo ago (03/01/22) 1 yr ago (10/25/21) 1 yr ago (04/08/21) 2 yr ago (01/08/21) 2 yr ago (10/01/20)  Cancer Antigen (CA) 125 0.0 - 38.1 U/mL 5.8 6.4 CM 7.1 CM 6.4 CM 7.5 CM 6.3 CM 6.4 CM     CT A/P on 11/08/22: 1. No evidence of metastatic disease in the abdomen or pelvis. No evidence of local tumor recurrence in the pelvis. 2.  Aortic Atherosclerosis (ICD10-I70.0).  Assessment & Plan: Vaniah Chambers is a 82 y.o. 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 BRCA negative on testing. S/p salvage 2nd line chemotherapy with carb/taxol completed 11/30/17. Now on Niraparib since March, 2019. No measurable disease.  Tolerating maintenance PARPi with Zejula. Recommend continuing this indefinitely or up to five years (2024) as she  is tolerating treatment well and is disease free.   Patient is overall doing very well and remains symptom-free.  She is NED on exam today.  Plan has been continued yearly imaging while she is on Zejula.  Recent imaging was negative for disease recurrence.   She and I reviewed signs and symptoms that would be concerning for disease recurrence and I stressed the importance of calling if she develops any of these before her next visit.  She is now more than 5 years out from completion of salvage chemotherapy in the setting of recurrence.  She continues on PARPi maintenance and remains NED.  We discussed signs and symptoms that should prompt a phone call.  Her port is not being used for any lab draws.  Given length of time that has been in situ, I recommended port removal.  Patient was amenable to order being placed for this.  20 minutes of total time was spent for this patient encounter, including preparation, face-to-face counseling with the patient and coordination of care, and documentation of the encounter.  Eugene Garnet, MD  Division of Gynecologic Oncology  Department of Obstetrics and Gynecology  [facility]

## 2023-01-18 ENCOUNTER — Other Ambulatory Visit: Payer: Self-pay

## 2023-01-25 ENCOUNTER — Ambulatory Visit (HOSPITAL_COMMUNITY)
Admission: RE | Admit: 2023-01-25 | Discharge: 2023-01-25 | Disposition: A | Discharge status: Home / Self Care | Payer: Medicare Other | Source: Ambulatory Visit | Attending: Gynecologic Oncology | Admitting: Gynecologic Oncology

## 2023-01-25 ENCOUNTER — Other Ambulatory Visit: Payer: Self-pay

## 2023-01-25 DIAGNOSIS — Z452 Encounter for adjustment and management of vascular access device: Secondary | ICD-10-CM | POA: Insufficient documentation

## 2023-01-25 DIAGNOSIS — C562 Malignant neoplasm of left ovary: Secondary | ICD-10-CM | POA: Diagnosis not present

## 2023-01-25 HISTORY — PX: IR REMOVAL TUN ACCESS W/ PORT W/O FL MOD SED: IMG2290

## 2023-01-25 MED ORDER — LIDOCAINE-EPINEPHRINE 1 %-1:100000 IJ SOLN
INTRAMUSCULAR | Status: AC
  Filled 2023-01-25: qty 1

## 2023-01-25 MED ORDER — LIDOCAINE-EPINEPHRINE 1 %-1:100000 IJ SOLN
20.0000 mL | Freq: Once | INTRAMUSCULAR | Status: AC
  Administered 2023-01-25: 10 mL via INTRADERMAL

## 2023-01-25 NOTE — Procedures (Signed)
Pre Procedural Dx: Poor venous access; Completed chemotherapy Post Procedural Dx: Same  Successful removal of anterior chest wall port-a-cath.  EBL: Trace No immediate post procedural complications.   Jay Megumi Treaster, MD Pager #: [phone]   

## 2023-01-26 ENCOUNTER — Other Ambulatory Visit (HOSPITAL_COMMUNITY): Payer: Self-pay

## 2023-02-09 ENCOUNTER — Other Ambulatory Visit (HOSPITAL_COMMUNITY): Payer: Self-pay

## 2023-02-14 ENCOUNTER — Other Ambulatory Visit (HOSPITAL_COMMUNITY): Payer: Self-pay

## 2023-02-16 ENCOUNTER — Other Ambulatory Visit: Payer: Self-pay

## 2023-02-21 ENCOUNTER — Other Ambulatory Visit (HOSPITAL_COMMUNITY): Payer: Self-pay

## 2023-02-24 ENCOUNTER — Other Ambulatory Visit: Payer: Self-pay | Admitting: Hematology

## 2023-02-24 DIAGNOSIS — C562 Malignant neoplasm of left ovary: Secondary | ICD-10-CM

## 2023-03-06 ENCOUNTER — Inpatient Hospital Stay: Payer: Medicare Other | Attending: Hematology

## 2023-03-06 DIAGNOSIS — D649 Anemia, unspecified: Secondary | ICD-10-CM | POA: Diagnosis not present

## 2023-03-06 DIAGNOSIS — R7989 Other specified abnormal findings of blood chemistry: Secondary | ICD-10-CM | POA: Insufficient documentation

## 2023-03-06 DIAGNOSIS — D529 Folate deficiency anemia, unspecified: Secondary | ICD-10-CM

## 2023-03-06 DIAGNOSIS — M2550 Pain in unspecified joint: Secondary | ICD-10-CM | POA: Diagnosis not present

## 2023-03-06 DIAGNOSIS — D508 Other iron deficiency anemias: Secondary | ICD-10-CM

## 2023-03-06 DIAGNOSIS — C562 Malignant neoplasm of left ovary: Secondary | ICD-10-CM

## 2023-03-06 DIAGNOSIS — C786 Secondary malignant neoplasm of retroperitoneum and peritoneum: Secondary | ICD-10-CM | POA: Insufficient documentation

## 2023-03-06 DIAGNOSIS — Z79899 Other long term (current) drug therapy: Secondary | ICD-10-CM | POA: Insufficient documentation

## 2023-03-06 DIAGNOSIS — I10 Essential (primary) hypertension: Secondary | ICD-10-CM | POA: Insufficient documentation

## 2023-03-06 DIAGNOSIS — C563 Malignant neoplasm of bilateral ovaries: Secondary | ICD-10-CM | POA: Diagnosis present

## 2023-03-06 DIAGNOSIS — M549 Dorsalgia, unspecified: Secondary | ICD-10-CM | POA: Diagnosis not present

## 2023-03-06 LAB — CBC WITH DIFFERENTIAL/PLATELET
Abs Immature Granulocytes: 0.01 10*3/uL (ref 0.00–0.07)
Basophils Absolute: 0.1 10*3/uL (ref 0.0–0.1)
Basophils Relative: 1 %
Eosinophils Absolute: 0.4 10*3/uL (ref 0.0–0.5)
Eosinophils Relative: 6 %
HCT: 31.5 % — ABNORMAL LOW (ref 36.0–46.0)
Hemoglobin: 10.4 g/dL — ABNORMAL LOW (ref 12.0–15.0)
Immature Granulocytes: 0 %
Lymphocytes Relative: 33 %
Lymphs Abs: 2.1 10*3/uL (ref 0.7–4.0)
MCH: 36.4 pg — ABNORMAL HIGH (ref 26.0–34.0)
MCHC: 33 g/dL (ref 30.0–36.0)
MCV: 110.1 fL — ABNORMAL HIGH (ref 80.0–100.0)
Monocytes Absolute: 1 10*3/uL (ref 0.1–1.0)
Monocytes Relative: 15 %
Neutro Abs: 2.8 10*3/uL (ref 1.7–7.7)
Neutrophils Relative %: 45 %
Platelets: 337 10*3/uL (ref 150–400)
RBC: 2.86 MIL/uL — ABNORMAL LOW (ref 3.87–5.11)
RDW: 13.8 % (ref 11.5–15.5)
WBC: 6.3 10*3/uL (ref 4.0–10.5)
nRBC: 0.3 % — ABNORMAL HIGH (ref 0.0–0.2)

## 2023-03-06 LAB — COMPREHENSIVE METABOLIC PANEL
ALT: 23 U/L (ref 0–44)
AST: 31 U/L (ref 15–41)
Albumin: 4.3 g/dL (ref 3.5–5.0)
Alkaline Phosphatase: 84 U/L (ref 38–126)
Anion gap: 9 (ref 5–15)
BUN: 18 mg/dL (ref 8–23)
CO2: 26 mmol/L (ref 22–32)
Calcium: 9 mg/dL (ref 8.9–10.3)
Chloride: 102 mmol/L (ref 98–111)
Creatinine, Ser: 1.46 mg/dL — ABNORMAL HIGH (ref 0.44–1.00)
GFR, Estimated: 36 mL/min — ABNORMAL LOW (ref >60)
Glucose, Bld: 95 mg/dL (ref 70–99)
Potassium: 3.9 mmol/L (ref 3.5–5.1)
Sodium: 137 mmol/L (ref 135–145)
Total Bilirubin: 0.7 mg/dL (ref 0.3–1.2)
Total Protein: 7.4 g/dL (ref 6.5–8.1)

## 2023-03-06 LAB — MAGNESIUM: Magnesium: 2 mg/dL (ref 1.7–2.4)

## 2023-03-06 LAB — IRON AND TIBC
Iron: 76 ug/dL (ref 28–170)
Saturation Ratios: 30 % (ref 10.4–31.8)
TIBC: 252 ug/dL (ref 250–450)
UIBC: 176 ug/dL

## 2023-03-06 LAB — FERRITIN: Ferritin: 827 ng/mL — ABNORMAL HIGH (ref 11–307)

## 2023-03-08 LAB — CA 125: Cancer Antigen (CA) 125: 6.5 U/mL (ref 0.0–38.1)

## 2023-03-12 NOTE — Progress Notes (Signed)
[facility] [address] [place] [address]    Clinic Day:  03/13/2023  Referring physician: Alvina Filbert, MD  Patient Care Team: Alvina Filbert, MD as PCP - General (Internal Medicine)   ASSESSMENT & PLAN:   Assessment: 1.  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 -3 cycles of carboplatin and paclitaxel followed by debulking surgery by Dr. Andrey Farmer on 08/07/2015 followed by 3 more cycles completed on 10/07/2015. -6 cycles of carboplatin and paclitaxel from 08/17/2017 through 11/30/2017 in the second line setting. -Germline mutation testing was negative. -Maintenance Niraparib 300 mg daily started in April 2019.  She was recommended to continue therapy indefinitely until progression by Dr. Andrey Farmer. -CTCAP on 12/17/2019 shows stable exam with no evidence of focal recurrence or metastatic disease.  Pelvic floor laxity with cystocele. -CTAP with contrast on 07/01/2020 with no residual or recurrent tumor or metastatic disease    Plan: 1.  Stage IVb ovarian cancer: - CTAP on 11/07/2022: No evidence of metastatic disease in the abdomen or pelvis.  No evidence of local recurrence. - Labs from 03/06/2023: CA125 6.5.  Mild anemia with hemoglobin 10.4 stable.  Ferritin is a 27 and percent saturation 30.  LFTs are normal.  Platelets are 337. - She has completed 5 years of niraparib.  We have discussed options of discontinuing it.  However she is tolerating it extremely well.  Hence it was recommended that she take it until progression. - Continue niraparib 300 mg daily.  RTC 4 months for follow-up.  Plan to repeat CTAP with contrast and tumor marker prior to next visit.   2.  Elevated creatinine: - Last creatinine was 1.11, increased to 1.46 likely from chlorthalidone.   3.  Back pain and joint pains: - Continue tramadol 50 mg daily as needed.   4.  Hypertension: - She is on lisinopril and metoprolol.  Chlorthalidone 25 mg was  added to her medications.  Blood pressure today is 160/67.  Will closely monitor.    Orders Placed This Encounter  Procedures   CT Abdomen Pelvis W Contrast    Standing Status:   Future    Standing Expiration Date:   03/12/2024    Order Specific Question:   If indicated for the ordered procedure, I authorize the administration of contrast media per Radiology protocol    Answer:   Yes    Order Specific Question:   Does the patient have a contrast media/X-ray dye allergy?    Answer:   No    Order Specific Question:   Preferred imaging location?    Answer:   [facility]    Order Specific Question:   If indicated for the ordered procedure, I authorize the administration of oral contrast media per Radiology protocol    Answer:   Yes   CBC with Differential    Standing Status:   Future    Standing Expiration Date:   03/12/2024   Comprehensive metabolic panel    Standing Status:   Future    Standing Expiration Date:   03/12/2024   CA 125    Standing Status:   Future    Standing Expiration Date:   03/12/2024   Magnesium    Standing Status:   Future    Standing Expiration Date:   03/12/2024      I,Katie Daubenspeck,acting as a scribe for Doreatha Massed, MD.,have documented all relevant documentation on the behalf of Doreatha Massed, MD,as directed by  [employer]  Ellin Saba, MD while in the presence of Doreatha Massed, MD.   I, Doreatha Massed MD, have reviewed the above documentation for accuracy and completeness, and I agree with the above.   Doreatha Massed, MD   6/24/20242:34 PM  CHIEF COMPLAINT:   Diagnosis: left ovarian cancer    Cancer Staging  Ovarian cancer [facility]) Staging form: Ovary, AJCC 7th Edition - Clinical stage from 04/27/2015: FIGO Stage IVB, calculated as Stage IV (T3c, N1, M1) - Signed by Ellouise Newer, PA-C on 09/16/2015 - Pathologic stage from 05/08/2015: FIGO Stage IVB, calculated as Stage IV (TX, N1, M1) - Signed by Adolphus Birchwood, MD  on 05/08/2015 - Pathologic stage from 07/28/2015: FIGO Stage IV (T3c, NX, M1) - Signed by Ellouise Newer, PA-C on 09/16/2015    Prior Therapy: 1. Carboplatin and paclitaxel x 3 cycles, 05/19/15 - 07/01/15 2. Debulking surgery with TAH-BSO on 08/07/2015. 3. Carboplatin and paclitaxel-- 3 cycles, 08/26/15 - 10/07/15; 6 cycles, 08/17/17 - 11/30/17.  Current Therapy:  Zejula 300 mg daily    HISTORY OF PRESENT ILLNESS:   Oncology History  Ovarian cancer (HCC)  04/25/2015 - 04/28/2015 Hospital Admission   Nausea/diarrhea.  Oncology consult completed on 04/27/2015.   04/25/2015 Tumor Marker   CA 125- 3902.      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   04/27/2015 Procedure   US Paracentesis- A total of approximately 3700 mL of amber colored fluid was removed. A fluid sample was sent for laboratory analysis.   04/27/2015 Imaging   CT Chest- Mild left supraclavicular lymphadenopathy and moderate mediastinal lymphadenopathy involving the prevascular, subcarinal and bilateral pericardiophrenic nodal chains, likely metastatic.   04/27/2015 Pathology Results   Diagnosis PERITONEAL/ASCITIC FLUID(SPECIMEN 1 OF 1 COLLECTED 04/27/15): MALIGNANT CELLS CONSISTENT WITH METASTATIC ADENOCARCINOMA.  The immunophenotype is most consistent with a gynecologic primary, most likely ovary.   05/08/2015 Miscellaneous   Seen by Dr. Andrey Farmer- recommending Carboplatin/Paclitaxel x 3 cycles and return visit to see her (within 1 week of administration of third cycle) to evaluate for optimal sequencing of treatment modalities.   05/13/2015 - 05/15/2015 Hospital Admission   Hospitalized for AKI   05/19/2015 - 07/01/2015 Chemotherapy   Carboplatin/Paclitaxel x 3 cycles   07/28/2015 Surgery   Exploratory laparotomy with total abdominal hysterectomy, bilateral salpingo-oophorectomy,  omentectomy radical tumor debulking for ovarian cancer; by Dr. Adolphus Birchwood.   07/28/2015 Pathology Results   Uterus +/- tubes/ovaries, neoplastic, cervix - HIGH GRADE SEROUS CARCINOMA INVOLVING LEFT OVARY, LEFT FALLOPIAN TUBE AND RIGHT FALLOPIAN TUBE. - CERVIX, ENDOMETRIUM AND MYOMETRIUM ARE FREE OF TUMOR. 2. Cul-de-sac biop...   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  The Breast/Ovarian gene panel offered by [facility] includes sequencing and rearrangement analysis for the following 20 genes:  ATM, BARD1, BRCA1, BRCA2, BRIP1, CDH1, CHEK2, EPCAM, FANCC, MLH1, MSH2, MSH6, NBN, PALB2, PMS2, PTEN, RAD51C, RAD51D, TP53, and XRCC2.   The report date is 12/14/2015.  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  The updated report date is October 29, 2019.   12/23/2015 Miscellaneous   Genetic counseling.  Genetic testing was normal, and  did not reveal a deleterious mutation in these genes   03/07/2017 Imaging   CT C/A/P IMPRESSION: Chest Impression:   No evidence of metastatic disease in thorax.   Abdomen / Pelvis Impression:   1. Interval increase in size of solitary nodular peritoneal implant in the LEFT upper quadrant . 2. No additional evidence of peritoneal nodularity. 3. No ascites.   07/31/2017 Progression   CT C/A/P: IMPRESSION: 1. Left upper quadrant nodule continues to progress, now measuring 20 x 26 mm. 2. New nodule identified between the in adrenal gland, concerning for metastatic deposit. 3. Interval development of tiny nodules in the left lower quadrant mesenteric, concerning for metastases. 4. No ascites. 5. Status post total abdominal hysterectomy and omentectomy.       INTERVAL HISTORY:   Latasha [name] is a 82 y.o. female presenting to clinic today for follow up of left ovarian cancer. She was last seen by me on 11/14/22.  Since her last visit, she saw Dr. Pricilla Holm for follow up on 01/12/23. There was no evidence of disease on physical exam.  She also had her port removed on 01/25/23.  Today, she states that she is doing well overall. Her appetite level is at 100%. Her energy level is at 75%.  PAST MEDICAL HISTORY:   Past Medical History: Past Medical History:  Diagnosis Date   Arthritis    B12 deficiency    Cataract    History of blood transfusion    History of chemotherapy    Hypertension    Iron deficiency anemia    Mixed hyperlipidemia    Ovarian cancer (HCC)    Regional enteritis (HCC)    Schizophrenia (HCC)    Ulcerative colitis (HCC)    Vitamin D deficiency     Surgical History: Past Surgical History:  Procedure Laterality Date   ABDOMINAL HYSTERECTOMY N/A 07/28/2015   Procedure: TOTAL HYSTERECTOMY ABDOMINAL BILATERAL SALPINGO OOPHRORECTOMY;  Surgeon: Adolphus Birchwood, MD;  Location: [facility];  Service: Gynecology;  Laterality: N/A;   DEBULKING N/A 07/28/2015   Procedure: DEBULKING;  Surgeon: Adolphus Birchwood, MD;  Location: [facility];  Service: Gynecology;  Laterality: N/A;   IR REMOVAL TUN ACCESS W/ PORT W/O FL MOD SED  01/25/2023   LAPAROTOMY N/A 07/28/2015   Procedure: EXPLORATORY LAPAROTOMY;  Surgeon: Adolphus Birchwood, MD;  Location: [facility];  Service: Gynecology;  Laterality: N/A;   OMENTECTOMY N/A 07/28/2015   Procedure: OMENTECTOMY ;  Surgeon: Adolphus Birchwood, MD;  Location: [facility];  Service: Gynecology;  Laterality: N/A;   PARACENTESIS  04/27/15   PORTACATH PLACEMENT Right 04/2015   REPLACEMENT TOTAL KNEE     right knee in 2003    Social History: Social History   Socioeconomic History   Marital status: Single    Spouse name: Not on file   Number of children: 1   Years of education: Not on file   Highest education level: Not on file  Occupational History    Not on file  Tobacco Use   Smoking status: Never   Smokeless tobacco: Never  Vaping Use   Vaping Use: Never used  Substance and Sexual Activity   Alcohol use: No   Drug use: No   Sexual activity: Not Currently  Other Topics Concern   Not on file  Social History Narrative   Not on file   Social Determinants of Health   Financial Resource Strain: Not on file  Food Insecurity: Not on file  Transportation Needs: Not on file  Physical Activity: Not on  file  Stress: Not on file  Social Connections: Not on file  Intimate Partner Violence: Not on file    Family History: Family History  Problem Relation Age of Onset   Congestive Heart Failure Mother    Seizures Sister    Prostate cancer Brother    Cancer Paternal Uncle        1 uncle with cancer NOS   Lung cancer Maternal Grandmother    Hypertension Maternal Grandfather    Colon cancer Neg Hx    Breast cancer Neg Hx    Ovarian cancer Neg Hx    Endometrial cancer Neg Hx    Pancreatic cancer Neg Hx     Current Medications:  Current Outpatient Medications:    acetaminophen (TYLENOL) 500 MG tablet, Take 2 tablets (1,000 mg total) by mouth every 12 (twelve) hours., Disp: 30 tablet, Rfl: 0   amLODipine (NORVASC) 10 MG tablet, Take 10 mg by mouth every evening. , Disp: , Rfl:    aspirin EC 81 MG tablet, Take 81 mg by mouth daily., Disp: , Rfl:    chlorthalidone (HYGROTON) 25 MG tablet, Take 25 mg by mouth every morning., Disp: , Rfl:    cholecalciferol (VITAMIN D) 25 MCG (1000 UNIT) tablet, Take 1,000 Units by mouth daily., Disp: , Rfl:    docusate sodium (COLACE) 100 MG capsule, Take 100 mg by mouth 2 (two) times daily., Disp: , Rfl:    folic acid (FOLVITE) 1 MG tablet, Take 1 mg by mouth daily., Disp: , Rfl:    lisinopril (PRINIVIL,ZESTRIL) 40 MG tablet, Take 40 mg by mouth daily. , Disp: , Rfl: 2   loratadine (CLARITIN) 10 MG tablet, Take 10 mg by mouth daily., Disp: , Rfl:    lovastatin (MEVACOR) 10 MG tablet, Take 10 mg by  mouth at bedtime., Disp: , Rfl:    melatonin 5 MG TABS, Take 5 mg by mouth at bedtime., Disp: , Rfl:    metoprolol tartrate (LOPRESSOR) 25 MG tablet, Take 1 tablet (25 mg total) by mouth 2 (two) times daily., Disp: 60 tablet, Rfl: 6   mirtazapine (REMERON) 7.5 MG tablet, Take 7.5 mg by mouth at bedtime., Disp: , Rfl:    Multiple Vitamin (MULTIVITAMIN WITH MINERALS) TABS tablet, Take 1 tablet by mouth daily., Disp: , Rfl:    niraparib tosylate (ZEJULA) 300 MG tablet, Take 1 tablet by mouth daily., Disp: 30 tablet, Rfl: 4   ondansetron (ZOFRAN) 8 MG tablet, Take 1 tablet (8 mg total) by mouth 2 (two) times daily as needed for refractory nausea / vomiting. Start on day 3 after chemo., Disp: 30 tablet, Rfl: 1   prochlorperazine (COMPAZINE) 10 MG tablet, Take 1 tablet (10 mg total) by mouth every 6 (six) hours as needed (Nausea or vomiting)., Disp: 60 tablet, Rfl: 3   QUEtiapine (SEROQUEL XR) 300 MG 24 hr tablet, Take 300 mg by mouth at bedtime., Disp: , Rfl:    sulfaSALAzine (AZULFIDINE) 500 MG tablet, Take 500 mg by mouth 3 (three) times daily., Disp: , Rfl:    traMADol (ULTRAM) 50 MG tablet, TAKE ONE TABLET BY MOUTH EVERY DAY AS NEEDED, Disp: 60 tablet, Rfl: 0   vitamin B-12 (CYANOCOBALAMIN) 1000 MCG tablet, TAKE 1 TABLET BY MOUTH ONCE DAILY., Disp: 90 tablet, Rfl: 3   Allergies: No Known Allergies  REVIEW OF SYSTEMS:   Review of Systems  Constitutional:  Negative for chills, fatigue and fever.  HENT:   Negative for lump/mass, mouth sores, nosebleeds, sore throat and trouble  swallowing.   Eyes:  Negative for eye problems.  Respiratory:  Negative for cough and shortness of breath.   Cardiovascular:  Negative for chest pain, leg swelling and palpitations.  Gastrointestinal:  Positive for constipation. Negative for abdominal pain, diarrhea, nausea and vomiting.  Genitourinary:  Negative for bladder incontinence, difficulty urinating, dysuria, frequency, hematuria and nocturia.   Musculoskeletal:   Negative for arthralgias, back pain, flank pain, myalgias and neck pain.  Skin:  Negative for itching and rash.  Neurological:  Negative for dizziness, headaches and numbness.  Hematological:  Does not bruise/bleed easily.  Psychiatric/Behavioral:  Negative for depression, sleep disturbance and suicidal ideas. The patient is not nervous/anxious.   All other systems reviewed and are negative.    VITALS:   Blood pressure (!) 161/67, pulse 85, temperature (!) 97.5 F (36.4 C), temperature source Tympanic, resp. rate 17, weight 157 lb 12.8 oz (71.6 kg), SpO2 99 %.  Wt Readings from Last 3 Encounters:  03/13/23 157 lb 12.8 oz (71.6 kg)  01/12/23 157 lb (71.2 kg)  11/14/22 159 lb 8 oz (72.3 kg)    Body mass index is 23.3 kg/m.  Performance status (ECOG): 1 - Symptomatic but completely ambulatory  PHYSICAL EXAM:   Physical Exam Vitals and nursing note reviewed. Exam conducted with a chaperone present.  Constitutional:      Appearance: Normal appearance.  Cardiovascular:     Rate and Rhythm: Normal rate and regular rhythm.     Pulses: Normal pulses.     Heart sounds: Normal heart sounds.  Pulmonary:     Effort: Pulmonary effort is normal.     Breath sounds: Normal breath sounds.  Abdominal:     Palpations: Abdomen is soft. There is no hepatomegaly, splenomegaly or mass.     Tenderness: There is no abdominal tenderness.  Musculoskeletal:     Right lower leg: No edema.     Left lower leg: No edema.  Lymphadenopathy:     Cervical: No cervical adenopathy.     Right cervical: No superficial, deep or posterior cervical adenopathy.    Left cervical: No superficial, deep or posterior cervical adenopathy.     Upper Body:     Right upper body: No supraclavicular or axillary adenopathy.     Left upper body: No supraclavicular or axillary adenopathy.  Neurological:     General: No focal deficit present.     Mental Status: She is alert and oriented to person, place, and time.   Psychiatric:        Mood and Affect: Mood normal.        Behavior: Behavior normal.     LABS:      Latest Ref Rng & Units 03/06/2023    2:47 PM 11/07/2022   11:41 AM 07/06/2022   10:49 AM  CBC  WBC 4.0 - 10.5 K/uL 6.3  4.7  4.6   Hemoglobin 12.0 - 15.0 g/dL 98.1  19.1  47.8   Hematocrit 36.0 - 46.0 % 31.5  33.0  32.6   Platelets 150 - 400 K/uL 337  294  309       Latest Ref Rng & Units 03/06/2023    2:47 PM 11/07/2022   11:41 AM 07/06/2022   10:49 AM  CMP  Glucose 70 - 99 mg/dL 95  96  98   BUN 8 - 23 mg/dL 18  12  13    Creatinine 0.44 - 1.00 mg/dL 2.95  6.21  3.08   Sodium 135 - 145 mmol/L 137  138  138   Potassium 3.5 - 5.1 mmol/L 3.9  3.8  3.9   Chloride 98 - 111 mmol/L 102  102  104   CO2 22 - 32 mmol/L 26  28  27    Calcium 8.9 - 10.3 mg/dL 9.0  9.2  9.3   Total Protein 6.5 - 8.1 g/dL 7.4  7.5  7.7   Total Bilirubin 0.3 - 1.2 mg/dL 0.7  0.5  0.7   Alkaline Phos 38 - 126 U/L 84  81  90   AST 15 - 41 U/L 31  35  33   ALT 0 - 44 U/L 23  30  26       Lab Results  Component Value Date   CEA 0.5 04/26/2015   /  CEA  Date Value Ref Range Status  04/26/2015 0.5 0.0 - 4.7 ng/mL Final    Comment:    (NOTE)       Roche ECLIA methodology       Nonsmokers  <3.9                                     Smokers     <5.6 Performed At: [facility] [address] [place], [place] [id number] Mila Homer MD [id number]    No results found for: "PSA1" No results found for: "CAN199" Lab Results  Component Value Date   CAN125 6.5 03/06/2023    Lab Results  Component Value Date   TOTALPROTELP 7.1 08/26/2015   ALBUMINELP 4.0 08/26/2015   A1GS 0.2 08/26/2015   A2GS 0.7 08/26/2015   BETS 1.0 08/26/2015   GAMS 1.2 08/26/2015   MSPIKE Not Observed 08/26/2015   SPEI Comment 08/26/2015   Lab Results  Component Value Date   TIBC 252 03/06/2023   TIBC 255 07/06/2022   TIBC 241 (L) 03/01/2022   FERRITIN 827 (H) 03/06/2023   FERRITIN 842 (H) 07/06/2022    FERRITIN 748 (H) 03/01/2022   IRONPCTSAT 30 03/06/2023   IRONPCTSAT 28 07/06/2022   IRONPCTSAT 36 (H) 03/01/2022   Lab Results  Component Value Date   LDH 219 (H) 03/24/2020   LDH 198 (H) 11/15/2018   LDH 185 08/13/2018     STUDIES:   No results found.

## 2023-03-13 ENCOUNTER — Encounter: Payer: Self-pay | Admitting: Hematology

## 2023-03-13 ENCOUNTER — Inpatient Hospital Stay (HOSPITAL_BASED_OUTPATIENT_CLINIC_OR_DEPARTMENT_OTHER): Payer: Medicare Other | Admitting: Hematology

## 2023-03-13 VITALS — BP 161/67 | HR 85 | Temp 97.5°F | Resp 17 | Wt 157.8 lb

## 2023-03-13 DIAGNOSIS — C562 Malignant neoplasm of left ovary: Secondary | ICD-10-CM | POA: Diagnosis not present

## 2023-03-13 DIAGNOSIS — C786 Secondary malignant neoplasm of retroperitoneum and peritoneum: Secondary | ICD-10-CM | POA: Diagnosis not present

## 2023-03-13 NOTE — Progress Notes (Signed)
Patient is taking Zejula as prescribed.  She has not missed any doses and reports no side effects at this time.   

## 2023-03-13 NOTE — Patient Instructions (Addendum)
[facility] at [facility] Discharge Instructions   You were seen and examined today by Dr. Ellin Saba.  He reviewed the results of your lab work which are normal/stable.   Continue Zejula as prescribed.   Return as scheduled.    Thank you for choosing [facility] at [facility] to provide your oncology and hematology care.  To afford each patient quality time with our provider, please arrive at least 15 minutes before your scheduled appointment time.   If you have a lab appointment with the Cancer Center please come in thru the Main Entrance and check in at the main information desk.  You need to re-schedule your appointment should you arrive 10 or more minutes late.  We strive to give you quality time with our providers, and arriving late affects you and other patients whose appointments are after yours.  Also, if you no show three or more times for appointments you may be dismissed from the clinic at the providers discretion.     Again, thank you for choosing [facility].  Our hope is that these requests will decrease the amount of time that you wait before being seen by our physicians.       _____________________________________________________________  Should you have questions after your visit to [facility], please contact our office at [phone] and follow the prompts.  Our office hours are 8:00 a.m. and 4:30 p.m. Monday - Friday.  Please note that voicemails left after 4:00 p.m. may not be returned until the following business day.  We are closed weekends and major holidays.  You do have access to a nurse 24-7, just call the main number to the clinic [phone] and do not press any options, hold on the line and a nurse will answer the phone.    For prescription refill requests, have your pharmacy contact our office and allow 72 hours.    Due to Covid, you will need to wear a mask upon entering the hospital.  If you do not have a mask, a mask will be given to you at the Main Entrance upon arrival. For doctor visits, patients may have 1 support person age 58 or older with them. For treatment visits, patients can not have anyone with them due to social distancing guidelines and our immunocompromised population.

## 2023-03-15 ENCOUNTER — Other Ambulatory Visit (HOSPITAL_COMMUNITY): Payer: Self-pay

## 2023-03-16 ENCOUNTER — Other Ambulatory Visit (HOSPITAL_COMMUNITY): Payer: Self-pay

## 2023-03-20 ENCOUNTER — Other Ambulatory Visit (HOSPITAL_COMMUNITY): Payer: Self-pay

## 2023-04-03 ENCOUNTER — Other Ambulatory Visit: Payer: Self-pay | Admitting: Hematology

## 2023-04-12 ENCOUNTER — Other Ambulatory Visit (HOSPITAL_COMMUNITY): Payer: Self-pay

## 2023-04-13 ENCOUNTER — Other Ambulatory Visit (HOSPITAL_COMMUNITY): Payer: Self-pay

## 2023-04-13 ENCOUNTER — Other Ambulatory Visit: Payer: Self-pay

## 2023-04-25 ENCOUNTER — Other Ambulatory Visit: Payer: Self-pay

## 2023-05-18 ENCOUNTER — Other Ambulatory Visit: Payer: Self-pay | Admitting: Hematology

## 2023-05-18 ENCOUNTER — Other Ambulatory Visit (HOSPITAL_COMMUNITY): Payer: Self-pay

## 2023-05-18 DIAGNOSIS — C562 Malignant neoplasm of left ovary: Secondary | ICD-10-CM

## 2023-05-18 MED ORDER — NIRAPARIB TOSYLATE 300 MG PO TABS
300.0000 mg | ORAL_TABLET | Freq: Every day | ORAL | 4 refills | Status: DC
Start: 2023-05-18 — End: 2023-10-16
  Filled 2023-05-18: qty 30, 30d supply, fill #0
  Filled 2023-06-14: qty 30, 30d supply, fill #1
  Filled 2023-07-19: qty 30, 30d supply, fill #2
  Filled 2023-08-10: qty 30, 30d supply, fill #3
  Filled 2023-09-12: qty 30, 30d supply, fill #4

## 2023-05-25 ENCOUNTER — Other Ambulatory Visit (HOSPITAL_COMMUNITY): Payer: Self-pay

## 2023-05-26 ENCOUNTER — Other Ambulatory Visit (HOSPITAL_COMMUNITY): Payer: Self-pay

## 2023-06-13 ENCOUNTER — Other Ambulatory Visit (HOSPITAL_COMMUNITY): Payer: Self-pay

## 2023-06-14 ENCOUNTER — Other Ambulatory Visit: Payer: Self-pay

## 2023-06-14 ENCOUNTER — Other Ambulatory Visit (HOSPITAL_COMMUNITY): Payer: Self-pay

## 2023-06-14 NOTE — Progress Notes (Signed)
Specialty Pharmacy Refill Coordination Note  Latasha [name] is a 82 y.o. female contacted today regarding refills of specialty medication(s) Niraparib Tosylate .  Patient requested Delivery  on 06/28/23  to verified address [address] [place] [address]   Medication will be filled on 06/27/23.

## 2023-06-27 ENCOUNTER — Other Ambulatory Visit: Payer: Self-pay

## 2023-06-28 ENCOUNTER — Other Ambulatory Visit (HOSPITAL_COMMUNITY): Payer: Self-pay

## 2023-06-29 ENCOUNTER — Other Ambulatory Visit: Payer: Self-pay

## 2023-06-29 ENCOUNTER — Other Ambulatory Visit (HOSPITAL_COMMUNITY): Payer: Self-pay

## 2023-07-04 ENCOUNTER — Inpatient Hospital Stay: Payer: Medicare Other | Attending: Hematology

## 2023-07-04 ENCOUNTER — Ambulatory Visit (HOSPITAL_COMMUNITY)
Admission: RE | Admit: 2023-07-04 | Discharge: 2023-07-04 | Disposition: A | Discharge status: Home / Self Care | Payer: Medicare Other | Source: Ambulatory Visit | Attending: Hematology | Admitting: Hematology

## 2023-07-04 ENCOUNTER — Encounter (HOSPITAL_COMMUNITY): Payer: Self-pay | Admitting: Radiology

## 2023-07-04 DIAGNOSIS — D649 Anemia, unspecified: Secondary | ICD-10-CM | POA: Insufficient documentation

## 2023-07-04 DIAGNOSIS — Z9221 Personal history of antineoplastic chemotherapy: Secondary | ICD-10-CM | POA: Insufficient documentation

## 2023-07-04 DIAGNOSIS — N993 Prolapse of vaginal vault after hysterectomy: Secondary | ICD-10-CM | POA: Insufficient documentation

## 2023-07-04 DIAGNOSIS — C562 Malignant neoplasm of left ovary: Secondary | ICD-10-CM

## 2023-07-04 DIAGNOSIS — I10 Essential (primary) hypertension: Secondary | ICD-10-CM | POA: Insufficient documentation

## 2023-07-04 DIAGNOSIS — Z79899 Other long term (current) drug therapy: Secondary | ICD-10-CM | POA: Insufficient documentation

## 2023-07-04 DIAGNOSIS — C563 Malignant neoplasm of bilateral ovaries: Secondary | ICD-10-CM | POA: Diagnosis present

## 2023-07-04 DIAGNOSIS — C786 Secondary malignant neoplasm of retroperitoneum and peritoneum: Secondary | ICD-10-CM | POA: Diagnosis present

## 2023-07-04 LAB — CBC WITH DIFFERENTIAL/PLATELET
Abs Immature Granulocytes: 0.02 10*3/uL (ref 0.00–0.07)
Basophils Absolute: 0.1 10*3/uL (ref 0.0–0.1)
Basophils Relative: 1 %
Eosinophils Absolute: 0.4 10*3/uL (ref 0.0–0.5)
Eosinophils Relative: 6 %
HCT: 28.6 % — ABNORMAL LOW (ref 36.0–46.0)
Hemoglobin: 9.3 g/dL — ABNORMAL LOW (ref 12.0–15.0)
Immature Granulocytes: 0 %
Lymphocytes Relative: 28 %
Lymphs Abs: 1.9 10*3/uL (ref 0.7–4.0)
MCH: 36.2 pg — ABNORMAL HIGH (ref 26.0–34.0)
MCHC: 32.5 g/dL (ref 30.0–36.0)
MCV: 111.3 fL — ABNORMAL HIGH (ref 80.0–100.0)
Monocytes Absolute: 1 10*3/uL (ref 0.1–1.0)
Monocytes Relative: 15 %
Neutro Abs: 3.3 10*3/uL (ref 1.7–7.7)
Neutrophils Relative %: 50 %
Platelets: 319 10*3/uL (ref 150–400)
RBC: 2.57 MIL/uL — ABNORMAL LOW (ref 3.87–5.11)
RDW: 13.3 % (ref 11.5–15.5)
WBC: 6.6 10*3/uL (ref 4.0–10.5)
nRBC: 0 % (ref 0.0–0.2)

## 2023-07-04 LAB — COMPREHENSIVE METABOLIC PANEL
ALT: 24 U/L (ref 0–44)
AST: 37 U/L (ref 15–41)
Albumin: 4.5 g/dL (ref 3.5–5.0)
Alkaline Phosphatase: 72 U/L (ref 38–126)
Anion gap: 10 (ref 5–15)
BUN: 17 mg/dL (ref 8–23)
CO2: 26 mmol/L (ref 22–32)
Calcium: 9.1 mg/dL (ref 8.9–10.3)
Chloride: 100 mmol/L (ref 98–111)
Creatinine, Ser: 1.44 mg/dL — ABNORMAL HIGH (ref 0.44–1.00)
GFR, Estimated: 37 mL/min — ABNORMAL LOW (ref >60)
Glucose, Bld: 111 mg/dL — ABNORMAL HIGH (ref 70–99)
Potassium: 4.3 mmol/L (ref 3.5–5.1)
Sodium: 136 mmol/L (ref 135–145)
Total Bilirubin: 0.7 mg/dL (ref 0.3–1.2)
Total Protein: 7.6 g/dL (ref 6.5–8.1)

## 2023-07-04 LAB — MAGNESIUM: Magnesium: 2.1 mg/dL (ref 1.7–2.4)

## 2023-07-04 MED ORDER — IOHEXOL 300 MG/ML  SOLN
80.0000 mL | Freq: Once | INTRAMUSCULAR | Status: AC | PRN
  Administered 2023-07-04: 80 mL via INTRAVENOUS

## 2023-07-05 LAB — CA 125: Cancer Antigen (CA) 125: 6.2 U/mL (ref 0.0–38.1)

## 2023-07-11 ENCOUNTER — Inpatient Hospital Stay (HOSPITAL_BASED_OUTPATIENT_CLINIC_OR_DEPARTMENT_OTHER): Payer: Medicare Other | Admitting: Hematology

## 2023-07-11 VITALS — BP 146/79 | HR 75 | Temp 98.0°F | Resp 20 | Ht 66.0 in | Wt 155.8 lb

## 2023-07-11 DIAGNOSIS — C786 Secondary malignant neoplasm of retroperitoneum and peritoneum: Secondary | ICD-10-CM | POA: Diagnosis not present

## 2023-07-11 DIAGNOSIS — D508 Other iron deficiency anemias: Secondary | ICD-10-CM

## 2023-07-11 DIAGNOSIS — C562 Malignant neoplasm of left ovary: Secondary | ICD-10-CM | POA: Diagnosis not present

## 2023-07-11 MED ORDER — TRAMADOL HCL 50 MG PO TABS: 50.0000 mg | ORAL_TABLET | Freq: Every day | ORAL | 0 refills | Status: DC | PRN

## 2023-07-11 NOTE — Patient Instructions (Signed)
[facility] at [facility] Discharge Instructions   You were seen and examined today by Dr. Ellin Saba.  He reviewed the results of your lab work which are normal/stable.   He reviewed the results of your CT scan which is normal.   Continue Zejula as prescribed.   Return as scheduled.    Thank you for choosing [facility] at [facility] to provide your oncology and hematology care.  To afford each patient quality time with our provider, please arrive at least 15 minutes before your scheduled appointment time.   If you have a lab appointment with the Cancer Center please come in thru the Main Entrance and check in at the main information desk.  You need to re-schedule your appointment should you arrive 10 or more minutes late.  We strive to give you quality time with our providers, and arriving late affects you and other patients whose appointments are after yours.  Also, if you no show three or more times for appointments you may be dismissed from the clinic at the providers discretion.     Again, thank you for choosing [facility].  Our hope is that these requests will decrease the amount of time that you wait before being seen by our physicians.       _____________________________________________________________  Should you have questions after your visit to [facility], please contact our office at [phone] and follow the prompts.  Our office hours are 8:00 a.m. and 4:30 p.m. Monday - Friday.  Please note that voicemails left after 4:00 p.m. may not be returned until the following business day.  We are closed weekends and major holidays.  You do have access to a nurse 24-7, just call the main number to the clinic [phone] and do not press any options, hold on the line and a nurse will answer the phone.    For prescription refill requests, have your pharmacy contact our office and allow 72 hours.    Due to  Covid, you will need to wear a mask upon entering the hospital. If you do not have a mask, a mask will be given to you at the Main Entrance upon arrival. For doctor visits, patients may have 1 support person age 61 or older with them. For treatment visits, patients can not have anyone with them due to social distancing guidelines and our immunocompromised population.

## 2023-07-11 NOTE — Progress Notes (Signed)
[facility] [address] [place] [address]    Clinic Day:  07/11/2023  Referring physician: Alvina Filbert, MD  Patient Care Team: Alvina Filbert, MD as PCP - General (Internal Medicine)   ASSESSMENT & PLAN:   Assessment: 1.  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 -3 cycles of carboplatin and paclitaxel followed by debulking surgery by Dr. Andrey Farmer on 08/07/2015 followed by 3 more cycles completed on 10/07/2015. -6 cycles of carboplatin and paclitaxel from 08/17/2017 through 11/30/2017 in the second line setting. -Germline mutation testing was negative. -Maintenance Niraparib 300 mg daily started in April 2019.  She was recommended to continue therapy indefinitely until progression by Dr. Andrey Farmer. -CTCAP on 12/17/2019 shows stable exam with no evidence of focal recurrence or metastatic disease.  Pelvic floor laxity with cystocele. -CTAP with contrast on 07/01/2020 with no residual or recurrent tumor or metastatic disease    Plan: 1.  Stage IVb ovarian cancer: - She is tolerating niraparib very well. - Reviewed labs today: Normal LFTs.  CBC shows anemia with hemoglobin 9.3, slightly worse than before.  CA125 is 6.2. - CTAP (07/04/2023): No evidence of metastatic disease. - Recommend continuing niraparib 300 mg daily. - RTC 3 months for follow-up with repeat CA125 and routine labs.  Will check ferritin and iron panel at that time.   2.  Elevated creatinine: - Creatinine is 1.44.  Likely from niraparib.   3.  Back pain and joint pains: - Continue tramadol 50 mg daily as needed.   4.  Hypertension: - Continue lisinopril, metoprolol and chlorthalidone daily.  Blood pressure is fairly well-controlled.    Orders Placed This Encounter  Procedures   CBC with Differential    Standing Status:   Future    Standing Expiration Date:   07/10/2024   Comprehensive metabolic panel    Standing Status:   Future    Standing  Expiration Date:   07/10/2024   Ferritin    Standing Status:   Future    Standing Expiration Date:   07/10/2024   Iron and TIBC ([facility] ONLY)    Standing Status:   Future    Standing Expiration Date:   07/10/2024   CA 125    Standing Status:   Future    Standing Expiration Date:   07/10/2024      Mikeal Hawthorne R Teague,acting as a scribe for Doreatha Massed, MD.,have documented all relevant documentation on the behalf of Doreatha Massed, MD,as directed by  Doreatha Massed, MD while in the presence of Doreatha Massed, MD.  I, Doreatha Massed MD, have reviewed the above documentation for accuracy and completeness, and I agree with the above.    Doreatha Massed, MD   10/22/20246:50 PM  CHIEF COMPLAINT:   Diagnosis: left ovarian cancer    Cancer Staging  Ovarian cancer [facility]) Staging form: Ovary, AJCC 7th Edition - Clinical stage from 04/27/2015: FIGO Stage IVB, calculated as Stage IV (T3c, N1, M1) - Signed by Ellouise Newer, PA-C on 09/16/2015 - Pathologic stage from 05/08/2015: FIGO Stage IVB, calculated as Stage IV (TX, N1, M1) - Signed by Adolphus Birchwood, MD on 05/08/2015 - Pathologic stage from 07/28/2015: FIGO Stage IV (T3c, NX, M1) - Signed by Ellouise Newer, PA-C on 09/16/2015    Prior Therapy: 1. Carboplatin and paclitaxel x 3 cycles, 05/19/15 - 07/01/15 2. Debulking surgery with TAH-BSO on 08/07/2015. 3. Carboplatin and paclitaxel-- 3 cycles, 08/26/15 - 10/07/15; 6 cycles, 08/17/17 - 11/30/17.  Current Therapy:  Zejula 300 mg daily    HISTORY OF PRESENT ILLNESS:   Oncology History  Ovarian cancer (HCC)  04/25/2015 - 04/28/2015 Hospital Admission   Nausea/diarrhea.  Oncology consult completed on 04/27/2015.   04/25/2015 Tumor Marker   CA 125- 3902.      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   04/27/2015 Procedure   US Paracentesis- A total of approximately 3700 mL of amber colored fluid was removed. A fluid sample was sent for laboratory analysis.   04/27/2015 Imaging   CT Chest- Mild left supraclavicular lymphadenopathy and moderate mediastinal lymphadenopathy involving the prevascular, subcarinal and bilateral pericardiophrenic nodal chains, likely metastatic.   04/27/2015 Pathology Results   Diagnosis PERITONEAL/ASCITIC FLUID(SPECIMEN 1 OF 1 COLLECTED 04/27/15): MALIGNANT CELLS CONSISTENT WITH METASTATIC ADENOCARCINOMA.  The immunophenotype is most consistent with a gynecologic primary, most likely ovary.   05/08/2015 Miscellaneous   Seen by Dr. Andrey Farmer- recommending Carboplatin/Paclitaxel x 3 cycles and return visit to see her (within 1 week of administration of third cycle) to evaluate for optimal sequencing of treatment modalities.   05/13/2015 - 05/15/2015 Hospital Admission   Hospitalized for AKI   05/19/2015 - 07/01/2015 Chemotherapy   Carboplatin/Paclitaxel x 3 cycles   07/28/2015 Surgery   Exploratory laparotomy with total abdominal hysterectomy, bilateral salpingo-oophorectomy, omentectomy radical tumor debulking for ovarian cancer; by Dr. Adolphus Birchwood.   07/28/2015 Pathology Results   Uterus +/- tubes/ovaries, neoplastic, cervix - HIGH GRADE SEROUS CARCINOMA INVOLVING LEFT OVARY, LEFT FALLOPIAN TUBE AND RIGHT FALLOPIAN TUBE. - CERVIX, ENDOMETRIUM AND MYOMETRIUM ARE FREE OF TUMOR. 2. Cul-de-sac biop...   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  The Breast/Ovarian gene panel offered by [facility] includes sequencing and rearrangement analysis for the following 20 genes:  ATM, BARD1, BRCA1, BRCA2, BRIP1, CDH1, CHEK2, EPCAM, FANCC, MLH1, MSH2, MSH6, NBN, PALB2, PMS2, PTEN, RAD51C, RAD51D, TP53, and XRCC2.   The report date is 12/14/2015.  Update:  MSH6  c.2667G>T VUS has been reclassified from a variant of uncertain significance to a likely benign variant based on a combination of sources, e.g., internal data, published literature, population databases and in silico models.  The updated report date is October 29, 2019.   12/23/2015 Miscellaneous   Genetic counseling.  Genetic testing was normal, and did not reveal a deleterious mutation in these genes   03/07/2017 Imaging   CT C/A/P IMPRESSION: Chest Impression:   No evidence of metastatic disease in thorax.   Abdomen / Pelvis Impression:   1. Interval increase in size of solitary nodular peritoneal implant in the LEFT upper quadrant . 2. No additional evidence of peritoneal nodularity. 3. No ascites.   07/31/2017 Progression   CT C/A/P: IMPRESSION: 1. Left upper quadrant nodule continues to progress, now measuring 20 x 26 mm. 2. New nodule identified between the in adrenal gland, concerning for metastatic deposit. 3. Interval development of tiny nodules in the left lower quadrant mesenteric, concerning for metastases. 4. No ascites. 5. Status post total abdominal hysterectomy and omentectomy.      INTERVAL HISTORY:   Latasha [name] is a 82 y.o. female presenting to clinic today for follow up of left ovarian cancer. She was last seen by me on 03/13/23.  Since her last visit, she underwent CT A/P  on 07/04/23 that found: no evidence for metastatic disease in the abdomen or pelvis and no findings to suggest local recurrence.   Today, she states that she is doing well overall. Her appetite level is at 75%. Her energy level is at 75%. She denies any side effects from Zejula or abdominal pain. She is taking tramadol prn and needs a refill.   PAST MEDICAL HISTORY:   Past Medical History: Past Medical History:  Diagnosis Date   Arthritis    B12 deficiency    Cataract    History of blood transfusion    History of chemotherapy    Hypertension    Iron deficiency anemia    Mixed  hyperlipidemia    Ovarian cancer (HCC)    Regional enteritis (HCC)    Schizophrenia (HCC)    Ulcerative colitis (HCC)    Vitamin D deficiency     Surgical History: Past Surgical History:  Procedure Laterality Date   ABDOMINAL HYSTERECTOMY N/A 07/28/2015   Procedure: TOTAL HYSTERECTOMY ABDOMINAL BILATERAL SALPINGO OOPHRORECTOMY;  Surgeon: Adolphus Birchwood, MD;  Location: [facility];  Service: Gynecology;  Laterality: N/A;   DEBULKING N/A 07/28/2015   Procedure: DEBULKING;  Surgeon: Adolphus Birchwood, MD;  Location: [facility];  Service: Gynecology;  Laterality: N/A;   IR REMOVAL TUN ACCESS W/ PORT W/O FL MOD SED  01/25/2023   LAPAROTOMY N/A 07/28/2015   Procedure: EXPLORATORY LAPAROTOMY;  Surgeon: Adolphus Birchwood, MD;  Location: [facility];  Service: Gynecology;  Laterality: N/A;   OMENTECTOMY N/A 07/28/2015   Procedure: OMENTECTOMY ;  Surgeon: Adolphus Birchwood, MD;  Location: [facility];  Service: Gynecology;  Laterality: N/A;   PARACENTESIS  04/27/15   PORTACATH PLACEMENT Right 04/2015   REPLACEMENT TOTAL KNEE     right knee in 2003    Social History: Social History   Socioeconomic History   Marital status: Single    Spouse name: Not on file   Number of children: 1   Years of education: Not on file   Highest education level: Not on file  Occupational History   Not on file  Tobacco Use   Smoking status: Never   Smokeless tobacco: Never  Vaping Use   Vaping status: Never Used  Substance and Sexual Activity   Alcohol use: No   Drug use: No   Sexual activity: Not Currently  Other Topics Concern   Not on file  Social History Narrative   Not on file   Social Determinants of Health   Financial Resource Strain: Not on file  Food Insecurity: Not on file  Transportation Needs: Not on file  Physical Activity: Not on file  Stress: Not on file  Social Connections: Not on file  Intimate Partner Violence: Not on file    Family History: Family History  Problem Relation Age of Onset   Congestive Heart Failure Mother     Seizures Sister    Prostate cancer Brother    Cancer Paternal Uncle        1 uncle with cancer NOS   Lung cancer Maternal Grandmother    Hypertension Maternal Grandfather    Colon cancer Neg Hx    Breast cancer Neg Hx    Ovarian cancer Neg Hx    Endometrial cancer Neg Hx    Pancreatic cancer Neg Hx     Current Medications:  Current Outpatient Medications:    acetaminophen (TYLENOL) 500 MG tablet, Take 2 tablets (1,000 mg total) by mouth every 12 (twelve) hours., Disp: 30 tablet, Rfl: 0  amLODipine (NORVASC) 10 MG tablet, Take 10 mg by mouth every evening. , Disp: , Rfl:    aspirin EC 81 MG tablet, Take 81 mg by mouth daily., Disp: , Rfl:    chlorthalidone (HYGROTON) 25 MG tablet, Take 25 mg by mouth every morning., Disp: , Rfl:    cholecalciferol (VITAMIN D) 25 MCG (1000 UNIT) tablet, Take 1,000 Units by mouth daily., Disp: , Rfl:    cyanocobalamin (VITAMIN B12) 1000 MCG tablet, TAKE 1 TABLET BY MOUTH ONCE DAILY., Disp: 90 tablet, Rfl: 3   docusate sodium (COLACE) 100 MG capsule, Take 100 mg by mouth 2 (two) times daily., Disp: , Rfl:    folic acid (FOLVITE) 1 MG tablet, Take 1 mg by mouth daily., Disp: , Rfl:    lisinopril (PRINIVIL,ZESTRIL) 40 MG tablet, Take 40 mg by mouth daily. , Disp: , Rfl: 2   loratadine (CLARITIN) 10 MG tablet, Take 10 mg by mouth daily., Disp: , Rfl:    lovastatin (MEVACOR) 10 MG tablet, Take 10 mg by mouth at bedtime., Disp: , Rfl:    melatonin 5 MG TABS, Take 5 mg by mouth at bedtime., Disp: , Rfl:    metoprolol tartrate (LOPRESSOR) 25 MG tablet, Take 1 tablet (25 mg total) by mouth 2 (two) times daily., Disp: 60 tablet, Rfl: 6   mirtazapine (REMERON) 7.5 MG tablet, Take 7.5 mg by mouth at bedtime., Disp: , Rfl:    Multiple Vitamin (MULTIVITAMIN WITH MINERALS) TABS tablet, Take 1 tablet by mouth daily., Disp: , Rfl:    niraparib tosylate (ZEJULA) 300 MG tablet, Take 1 tablet by mouth daily., Disp: 30 tablet, Rfl: 4   ondansetron (ZOFRAN) 8 MG tablet,  Take 1 tablet (8 mg total) by mouth 2 (two) times daily as needed for refractory nausea / vomiting. Start on day 3 after chemo., Disp: 30 tablet, Rfl: 1   prochlorperazine (COMPAZINE) 10 MG tablet, Take 1 tablet (10 mg total) by mouth every 6 (six) hours as needed (Nausea or vomiting)., Disp: 60 tablet, Rfl: 3   QUEtiapine (SEROQUEL XR) 300 MG 24 hr tablet, Take 300 mg by mouth at bedtime., Disp: , Rfl:    sulfaSALAzine (AZULFIDINE) 500 MG tablet, Take 500 mg by mouth 3 (three) times daily., Disp: , Rfl:    traMADol (ULTRAM) 50 MG tablet, TAKE ONE TABLET BY MOUTH EVERY DAY AS NEEDED, Disp: 60 tablet, Rfl: 0   Allergies: No Known Allergies  REVIEW OF SYSTEMS:   Review of Systems  Constitutional:  Negative for chills, fatigue and fever.  HENT:   Negative for lump/mass, mouth sores, nosebleeds, sore throat and trouble swallowing.   Eyes:  Negative for eye problems.  Respiratory:  Negative for cough and shortness of breath.   Cardiovascular:  Negative for chest pain, leg swelling and palpitations.  Gastrointestinal:  Negative for abdominal pain, constipation, diarrhea, nausea and vomiting.  Genitourinary:  Negative for bladder incontinence, difficulty urinating, dysuria, frequency, hematuria and nocturia.   Musculoskeletal:  Negative for arthralgias, back pain, flank pain, myalgias and neck pain.  Skin:  Negative for itching and rash.  Neurological:  Negative for dizziness, headaches and numbness.  Hematological:  Does not bruise/bleed easily.  Psychiatric/Behavioral:  Negative for depression, sleep disturbance and suicidal ideas. The patient is not nervous/anxious.   All other systems reviewed and are negative.    VITALS:   Blood pressure (!) 146/79, pulse 75, temperature 98 F (36.7 C), resp. rate 20, height 5\' 6"  (1.676 m), weight 155 lb 12.8 oz (  70.7 kg), SpO2 98%.  Wt Readings from Last 3 Encounters:  07/11/23 155 lb 12.8 oz (70.7 kg)  03/13/23 157 lb 12.8 oz (71.6 kg)  01/12/23  157 lb (71.2 kg)    Body mass index is 25.15 kg/m.  Performance status (ECOG): 1 - Symptomatic but completely ambulatory  PHYSICAL EXAM:   Physical Exam Vitals and nursing note reviewed. Exam conducted with a chaperone present.  Constitutional:      Appearance: Normal appearance.  Cardiovascular:     Rate and Rhythm: Normal rate and regular rhythm.     Pulses: Normal pulses.     Heart sounds: Normal heart sounds.  Pulmonary:     Effort: Pulmonary effort is normal.     Breath sounds: Normal breath sounds.  Abdominal:     Palpations: Abdomen is soft. There is no hepatomegaly, splenomegaly or mass.     Tenderness: There is no abdominal tenderness.  Musculoskeletal:     Right lower leg: No edema.     Left lower leg: No edema.  Lymphadenopathy:     Cervical: No cervical adenopathy.     Right cervical: No superficial, deep or posterior cervical adenopathy.    Left cervical: No superficial, deep or posterior cervical adenopathy.     Upper Body:     Right upper body: No supraclavicular or axillary adenopathy.     Left upper body: No supraclavicular or axillary adenopathy.  Neurological:     General: No focal deficit present.     Mental Status: She is alert and oriented to person, place, and time.  Psychiatric:        Mood and Affect: Mood normal.        Behavior: Behavior normal.     LABS:      Latest Ref Rng & Units 07/04/2023   11:03 AM 03/06/2023    2:47 PM 11/07/2022   11:41 AM  CBC  WBC 4.0 - 10.5 K/uL 6.6  6.3  4.7   Hemoglobin 12.0 - 15.0 g/dL 9.3  88.4  16.6   Hematocrit 36.0 - 46.0 % 28.6  31.5  33.0   Platelets 150 - 400 K/uL 319  337  294       Latest Ref Rng & Units 07/04/2023   11:03 AM 03/06/2023    2:47 PM 11/07/2022   11:41 AM  CMP  Glucose 70 - 99 mg/dL 063  95  96   BUN 8 - 23 mg/dL 17  18  12    Creatinine 0.44 - 1.00 mg/dL 0.16  0.10  9.32   Sodium 135 - 145 mmol/L 136  137  138   Potassium 3.5 - 5.1 mmol/L 4.3  3.9  3.8   Chloride 98 - 111  mmol/L 100  102  102   CO2 22 - 32 mmol/L 26  26  28    Calcium 8.9 - 10.3 mg/dL 9.1  9.0  9.2   Total Protein 6.5 - 8.1 g/dL 7.6  7.4  7.5   Total Bilirubin 0.3 - 1.2 mg/dL 0.7  0.7  0.5   Alkaline Phos 38 - 126 U/L 72  84  81   AST 15 - 41 U/L 37  31  35   ALT 0 - 44 U/L 24  23  30       Lab Results  Component Value Date   CEA 0.5 04/26/2015   /  CEA  Date Value Ref Range Status  04/26/2015 0.5 0.0 - 4.7 ng/mL Final  Comment:    (NOTE)       Roche ECLIA methodology       Nonsmokers  <3.9                                     Smokers     <5.6 Performed At: [facility] [address] [place], [place] [id number] Mila Homer MD WU:[id number]    No results found for: "PSA1" No results found for: "CAN199" Lab Results  Component Value Date   CAN125 6.2 07/04/2023    Lab Results  Component Value Date   TOTALPROTELP 7.1 08/26/2015   ALBUMINELP 4.0 08/26/2015   A1GS 0.2 08/26/2015   A2GS 0.7 08/26/2015   BETS 1.0 08/26/2015   GAMS 1.2 08/26/2015   MSPIKE Not Observed 08/26/2015   SPEI Comment 08/26/2015   Lab Results  Component Value Date   TIBC 252 03/06/2023   TIBC 255 07/06/2022   TIBC 241 (L) 03/01/2022   FERRITIN 827 (H) 03/06/2023   FERRITIN 842 (H) 07/06/2022   FERRITIN 748 (H) 03/01/2022   IRONPCTSAT 30 03/06/2023   IRONPCTSAT 28 07/06/2022   IRONPCTSAT 36 (H) 03/01/2022   Lab Results  Component Value Date   LDH 219 (H) 03/24/2020   LDH 198 (H) 11/15/2018   LDH 185 08/13/2018     STUDIES:   CT Abdomen Pelvis W Contrast  Result Date: 07/11/2023 CLINICAL DATA:  Stage IV ovarian cancer. Restaging. * Tracking Code: BO * EXAM: CT ABDOMEN AND PELVIS WITH CONTRAST TECHNIQUE: Multidetector CT imaging of the abdomen and pelvis was performed using the standard protocol following bolus administration of intravenous contrast. RADIATION DOSE REDUCTION: This exam was performed according to the departmental dose-optimization program which includes  automated exposure control, adjustment of the mA and/or kV according to patient size and/or use of iterative reconstruction technique. CONTRAST:  80mL OMNIPAQUE IOHEXOL 300 MG/ML  SOLN COMPARISON:  11/07/2022 FINDINGS: Lower chest: Unremarkable. Hepatobiliary: No suspicious focal abnormality within the liver parenchyma. There is no evidence for gallstones, gallbladder wall thickening, or pericholecystic fluid. No intrahepatic or extrahepatic biliary dilation. Pancreas: No focal mass lesion. No dilatation of the main duct. No intraparenchymal cyst. No peripancreatic edema. Spleen: No splenomegaly. No suspicious focal mass lesion. Adrenals/Urinary Tract: No adrenal nodule or mass. Kidneys unremarkable. No evidence for hydroureter. The urinary bladder appears normal for the degree of distention. Stomach/Bowel: Stomach is unremarkable. No gastric wall thickening. No evidence of outlet obstruction. Duodenum is normally positioned as is the ligament of Treitz. No small bowel wall thickening. No small bowel dilatation. The terminal ileum is normal. The appendix is normal. No gross colonic mass. No colonic wall thickening. Vascular/Lymphatic: There is mild atherosclerotic calcification of the abdominal aorta without aneurysm. There is no gastrohepatic or hepatoduodenal ligament lymphadenopathy. No retroperitoneal or mesenteric lymphadenopathy. No pelvic sidewall lymphadenopathy. Reproductive: Hysterectomy.  There is no adnexal mass. Other: No intraperitoneal free fluid. Musculoskeletal: No worrisome lytic or sclerotic osseous abnormality. Degenerative changes noted lumbar spine with spondylolisthesis at L4-5, stable. IMPRESSION: 1. Stable. No evidence for metastatic disease in the abdomen or pelvis. No findings to suggest local recurrence. 2.  Aortic Atherosclerosis (ICD10-I70.0). Electronically Signed   By: Kennith Center M.D.   On: 07/11/2023 09:39

## 2023-07-11 NOTE — Progress Notes (Signed)
Patient is taking Zejula as prescribed.  She has not missed any doses and reports no side effects at this time.   

## 2023-07-19 ENCOUNTER — Other Ambulatory Visit: Payer: Self-pay

## 2023-07-19 NOTE — Progress Notes (Signed)
Specialty Pharmacy Ongoing Clinical Assessment Note  Latasha [name] is a 82 y.o. female who is being followed by the specialty pharmacy service for RxSp Oncology   Patient's specialty medication(s) reviewed today: Niraparib Tosylate   Missed doses in the last 4 weeks: 0   Patient/Caregiver did not have any additional questions or concerns.   Therapeutic benefit summary: Patient is achieving benefit   Adverse events/side effects summary: No adverse events/side effects   Patient's therapy is appropriate to: Continue    Goals Addressed             This Visit's Progress    Achieve a cure       Patient is on track. Patient will maintain adherence. Per provider notes no evidence of metastatic disease from 07/04/23 scans.          Follow up:  6 months  Otto Herb Specialty Pharmacist

## 2023-07-19 NOTE — Progress Notes (Signed)
Specialty Pharmacy Refill Coordination Note  Stephanieann Gilland is a 82 y.o. female contacted today regarding refills of specialty medication(s) [name]   Patient requested Delivery   Delivery date: 07/26/23   Verified address: [address] [place] [address]   Medication will be filled on 07/24/23. [facility] usually takes 2 days to deliver.

## 2023-07-24 ENCOUNTER — Other Ambulatory Visit (HOSPITAL_COMMUNITY): Payer: Self-pay

## 2023-08-10 ENCOUNTER — Other Ambulatory Visit: Payer: Self-pay

## 2023-08-10 ENCOUNTER — Other Ambulatory Visit (HOSPITAL_COMMUNITY): Payer: Self-pay

## 2023-08-10 NOTE — Progress Notes (Signed)
Specialty Pharmacy Refill Coordination Note  Latasha [name] is a 82 y.o. female contacted today regarding refills of specialty medication(s) [name]   Patient requested Delivery   Delivery date: 08/22/23   Verified address: [address]  [place] [address]   Medication will be filled on 08/21/23.

## 2023-08-21 ENCOUNTER — Other Ambulatory Visit: Payer: Self-pay

## 2023-08-22 ENCOUNTER — Other Ambulatory Visit: Payer: Self-pay

## 2023-08-22 ENCOUNTER — Telehealth: Payer: Self-pay | Admitting: *Deleted

## 2023-08-22 NOTE — Telephone Encounter (Signed)
Per Provider moved the appt from 12/6 to 12/26

## 2023-08-25 ENCOUNTER — Ambulatory Visit: Payer: Medicare Other | Admitting: Gynecologic Oncology

## 2023-09-06 ENCOUNTER — Encounter: Payer: Self-pay | Admitting: Gynecologic Oncology

## 2023-09-07 ENCOUNTER — Other Ambulatory Visit: Payer: Self-pay

## 2023-09-08 ENCOUNTER — Other Ambulatory Visit: Payer: Self-pay

## 2023-09-09 ENCOUNTER — Emergency Department (HOSPITAL_COMMUNITY)
Admission: EM | Admit: 2023-09-09 | Discharge: 2023-09-09 | Disposition: A | Discharge status: Home / Self Care | Payer: Medicare Other | Attending: Emergency Medicine | Admitting: Emergency Medicine

## 2023-09-09 ENCOUNTER — Emergency Department (HOSPITAL_COMMUNITY): Payer: Medicare Other

## 2023-09-09 ENCOUNTER — Encounter (HOSPITAL_COMMUNITY): Payer: Self-pay | Admitting: Emergency Medicine

## 2023-09-09 ENCOUNTER — Other Ambulatory Visit: Payer: Self-pay

## 2023-09-09 DIAGNOSIS — R42 Dizziness and giddiness: Secondary | ICD-10-CM | POA: Insufficient documentation

## 2023-09-09 DIAGNOSIS — Z79899 Other long term (current) drug therapy: Secondary | ICD-10-CM | POA: Diagnosis not present

## 2023-09-09 DIAGNOSIS — I10 Essential (primary) hypertension: Secondary | ICD-10-CM | POA: Diagnosis not present

## 2023-09-09 DIAGNOSIS — Z7982 Long term (current) use of aspirin: Secondary | ICD-10-CM | POA: Insufficient documentation

## 2023-09-09 DIAGNOSIS — Z8543 Personal history of malignant neoplasm of ovary: Secondary | ICD-10-CM | POA: Diagnosis not present

## 2023-09-09 LAB — CBC WITH DIFFERENTIAL/PLATELET
Abs Immature Granulocytes: 0.02 10*3/uL (ref 0.00–0.07)
Basophils Absolute: 0.1 10*3/uL (ref 0.0–0.1)
Basophils Relative: 1 %
Eosinophils Absolute: 0.1 10*3/uL (ref 0.0–0.5)
Eosinophils Relative: 2 %
HCT: 28.5 % — ABNORMAL LOW (ref 36.0–46.0)
Hemoglobin: 9.2 g/dL — ABNORMAL LOW (ref 12.0–15.0)
Immature Granulocytes: 0 %
Lymphocytes Relative: 7 %
Lymphs Abs: 0.5 10*3/uL — ABNORMAL LOW (ref 0.7–4.0)
MCH: 35.9 pg — ABNORMAL HIGH (ref 26.0–34.0)
MCHC: 32.3 g/dL (ref 30.0–36.0)
MCV: 111.3 fL — ABNORMAL HIGH (ref 80.0–100.0)
Monocytes Absolute: 0.6 10*3/uL (ref 0.1–1.0)
Monocytes Relative: 10 %
Neutro Abs: 5.2 10*3/uL (ref 1.7–7.7)
Neutrophils Relative %: 80 %
Platelets: 297 10*3/uL (ref 150–400)
RBC: 2.56 MIL/uL — ABNORMAL LOW (ref 3.87–5.11)
RDW: 13.2 % (ref 11.5–15.5)
WBC: 6.5 10*3/uL (ref 4.0–10.5)
nRBC: 0 % (ref 0.0–0.2)

## 2023-09-09 LAB — URINALYSIS, ROUTINE W REFLEX MICROSCOPIC
Bacteria, UA: NONE SEEN
Bilirubin Urine: NEGATIVE
Glucose, UA: NEGATIVE mg/dL
Hgb urine dipstick: NEGATIVE
Ketones, ur: NEGATIVE mg/dL
Nitrite: NEGATIVE
Protein, ur: NEGATIVE mg/dL
Specific Gravity, Urine: 1.017 (ref 1.005–1.030)
pH: 7 (ref 5.0–8.0)

## 2023-09-09 LAB — COMPREHENSIVE METABOLIC PANEL
ALT: 27 U/L (ref 0–44)
AST: 34 U/L (ref 15–41)
Albumin: 3.9 g/dL (ref 3.5–5.0)
Alkaline Phosphatase: 72 U/L (ref 38–126)
Anion gap: 7 (ref 5–15)
BUN: 17 mg/dL (ref 8–23)
CO2: 27 mmol/L (ref 22–32)
Calcium: 9.5 mg/dL (ref 8.9–10.3)
Chloride: 104 mmol/L (ref 98–111)
Creatinine, Ser: 1.46 mg/dL — ABNORMAL HIGH (ref 0.44–1.00)
GFR, Estimated: 36 mL/min — ABNORMAL LOW (ref >60)
Glucose, Bld: 141 mg/dL — ABNORMAL HIGH (ref 70–99)
Potassium: 4.1 mmol/L (ref 3.5–5.1)
Sodium: 138 mmol/L (ref 135–145)
Total Bilirubin: 0.6 mg/dL (ref <1.2)
Total Protein: 7 g/dL (ref 6.5–8.1)

## 2023-09-09 LAB — ETHANOL: Alcohol, Ethyl (B): <10 mg/dL (ref <10)

## 2023-09-09 NOTE — ED Provider Notes (Signed)
[place] EMERGENCY DEPARTMENT AT [facility] Provider Note   CSN: [id number] Arrival date & time: 09/09/23  1030     History  Chief Complaint  Patient presents with   Dizziness    Latasha [name] is a 82 y.o. female.  HPI Patient presents with concern of dizziness and weakness. She has a history of ovarian cancer, stage IV, notes that she has been in remission.  She is scheduled for follow-up with her oncologist in 4 days. About 1 week ago she noticed new, atypical weakness and dizziness.  Since that time symptoms have been persistent, worsening today.  No focal pain, no vomiting, she notes that with attempting to ambulate, or get up, she feels weaker than usual.  No fever, vomiting, diarrhea.  No recent change in medication.    Home Medications Prior to Admission medications   Medication Sig Start Date End Date Taking? Authorizing Provider  acetaminophen (TYLENOL) 500 MG tablet Take 2 tablets (1,000 mg total) by mouth every 12 (twelve) hours. 07/31/15   Adolphus Birchwood, MD  amLODipine (NORVASC) 10 MG tablet Take 10 mg by mouth every evening.     [provider]  aspirin EC 81 MG tablet Take 81 mg by mouth daily.    [provider]  chlorthalidone (HYGROTON) 25 MG tablet Take 25 mg by mouth every morning. 02/28/23   [provider]  cholecalciferol (VITAMIN D) 25 MCG (1000 UNIT) tablet Take 1,000 Units by mouth daily. 09/26/19   [provider]  cyanocobalamin (VITAMIN B12) 1000 MCG tablet TAKE 1 TABLET BY MOUTH ONCE DAILY. 04/03/23   Doreatha Massed, MD  docusate sodium (COLACE) 100 MG capsule Take 100 mg by mouth 2 (two) times daily.    [provider]  folic acid (FOLVITE) 1 MG tablet Take 1 mg by mouth daily.    [provider]  lisinopril (PRINIVIL,ZESTRIL) 40 MG tablet Take 40 mg by mouth daily.  06/04/18   [provider]  loratadine (CLARITIN) 10 MG tablet Take 10 mg by mouth daily. 03/15/21   [provider]  lovastatin (MEVACOR) 10 MG tablet Take 10 mg by mouth at bedtime.    [provider]  melatonin 5 MG TABS Take 5 mg by mouth at bedtime.    [provider]  metoprolol tartrate (LOPRESSOR) 25 MG tablet Take 1 tablet (25 mg total) by mouth 2 (two) times daily. 05/08/17   Ralene Cork, MD  mirtazapine (REMERON) 7.5 MG tablet Take 7.5 mg by mouth at bedtime. 12/16/20   [provider]  Multiple Vitamin (MULTIVITAMIN WITH MINERALS) TABS tablet Take 1 tablet by mouth daily.    [provider]  niraparib tosylate (ZEJULA) 300 MG tablet Take 1 tablet by mouth daily. 05/18/23   Doreatha Massed, MD  ondansetron (ZOFRAN) 8 MG tablet Take 1 tablet (8 mg total) by mouth 2 (two) times daily as needed for refractory nausea / vomiting. Start on day 3 after chemo. 08/16/17   Ralene Cork, MD  prochlorperazine (COMPAZINE) 10 MG tablet Take 1 tablet (10 mg total) by mouth every 6 (six) hours as needed (Nausea or vomiting). 04/16/18   Higgs, Minta Balsam, MD  QUEtiapine (SEROQUEL XR) 300 MG 24 hr tablet Take 300 mg by mouth at bedtime.    [provider]  sulfaSALAzine (AZULFIDINE) 500 MG tablet Take 500 mg by mouth 3 (three) times daily.    [provider]  traMADol (ULTRAM) 50 MG tablet Take 1 tablet (50 mg total) by  mouth daily as needed. 07/11/23   Doreatha Massed, MD      Allergies    Patient has no known allergies.    Review of Systems   Review of Systems  Physical Exam Updated Vital Signs BP (!) 117/53   Pulse 76   Temp 98 F (36.7 C) (Oral)   Resp 20   SpO2 100%  Physical Exam Vitals and nursing note reviewed.  Constitutional:      General: She is not in acute distress.    Appearance: She is well-developed.  HENT:     Head: Normocephalic and atraumatic.  Eyes:     Conjunctiva/sclera: Conjunctivae normal.  Cardiovascular:     Rate and Rhythm: Normal rate and regular rhythm.  Pulmonary:     Effort: Pulmonary effort is  normal. No respiratory distress.     Breath sounds: Normal breath sounds. No stridor.  Abdominal:     General: There is no distension.  Skin:    General: Skin is warm and dry.  Neurological:     Mental Status: She is alert and oriented to person, place, and time.     Cranial Nerves: No cranial nerve deficit.     Motor: Atrophy present.     Comments: Slow to follow commands, but does follow them, with delay.  Psychiatric:        Mood and Affect: Mood normal.     ED Results / Procedures / Treatments   Labs (all labs ordered are listed, but only abnormal results are displayed) Labs Reviewed  COMPREHENSIVE METABOLIC PANEL - Abnormal; Notable for the following components:      Result Value   Glucose, Bld 141 (*)    Creatinine, Ser 1.46 (*)    GFR, Estimated 36 (*)    All other components within normal limits  CBC WITH DIFFERENTIAL/PLATELET - Abnormal; Notable for the following components:   RBC 2.56 (*)    Hemoglobin 9.2 (*)    HCT 28.5 (*)    MCV 111.3 (*)    MCH 35.9 (*)    Lymphs Abs 0.5 (*)    All other components within normal limits  URINALYSIS, ROUTINE W REFLEX MICROSCOPIC - Abnormal; Notable for the following components:   Leukocytes,Ua SMALL (*)    All other components within normal limits  ETHANOL  CA 125    EKG EKG Interpretation Date/Time:  Saturday September 09 2023 10:42:42 EST Ventricular Rate:  91 PR Interval:  135 QRS Duration:  87 QT Interval:  341 QTC Calculation: 420 R Axis:   69  Text Interpretation: Sinus rhythm Probable left atrial enlargement Probable left ventricular hypertrophy Confirmed by Gerhard Munch [phone]) on 09/09/2023 10:49:40 AM  Radiology MR Brain Wo Contrast (neuro protocol) Result Date: 09/09/2023 CLINICAL DATA:  Provided history: Neuro deficit, acute, stroke suspected. Mental status change, unknown cause. Headache, new onset. Additional history provided: History of ovarian cancer. Disequilibrium. Syncope. Nausea. EXAM: MRI  HEAD WITHOUT CONTRAST TECHNIQUE: Multiplanar, multiecho pulse sequences of the brain and surrounding structures were obtained without intravenous contrast. COMPARISON:  None. FINDINGS: Brain: No age advanced or lobar predominant parenchymal atrophy. Multifocal T2 FLAIR hyperintense signal abnormality within the cerebral white matter, nonspecific but compatible with mild chronic small vessel ischemic disease. There is no acute infarct. No evidence of an intracranial mass. No chronic intracranial blood products. No extra-axial fluid collection. No midline shift. Vascular: Maintained flow voids within the proximal large arterial vessels. Skull and upper cervical spine: No focal worrisome marrow lesion. Incompletely assessed cervical  spondylosis. At C3-C4, a posterior disc osteophyte complex contributes to at least moderate spinal canal stenosis (with some flattening of the ventral spinal cord). Sinuses/Orbits: No mass or acute finding within the imaged orbits. No significant paranasal sinus disease. IMPRESSION: 1. No evidence of an acute intracranial abnormality. 2. 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 3. Incompletely assessed cervical spondylosis. At C3-C4, a posterior disc osteophyte complex contributes to at least moderate spinal canal stenosis (with some flattening of the ventral spinal cord). Electronically Signed   By: Jackey Loge D.O.   On: 09/09/2023 12:57    Procedures Procedures    Medications Ordered in ED Medications - No data to display  ED Course/ Medical Decision Making/ A&P                                 Medical Decision Making Elderly female with history of metastatic ovarian cancer presents with new weakness, dizziness.  Broad differential including electrolyte abnormalities, infection, return of malignancy. Cardiac 90 sinus normal Pulse ox 98% room air normal   Amount and/or Complexity of Data Reviewed Independent Historian:  friend External Data Reviewed: notes.    Details: Oncology notes reviewed, pertinent details below Labs: ordered. Decision-making details documented in ED Course. Radiology: ordered and independent interpretation performed. Decision-making details documented in ED Course. ECG/medicine tests: ordered and independent interpretation performed. Decision-making details documented in ED Course.   Oncology last office note Assessment: 1.  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 -3 cycles of carboplatin and paclitaxel followed by debulking surgery by Dr. Andrey Farmer on 08/07/2015 followed by 3 more cycles completed on 10/07/2015. -6 cycles of carboplatin and paclitaxel from 08/17/2017 through 11/30/2017 in the second line setting. -Germline mutation testing was negative. -Maintenance Niraparib 300 mg daily started in April 2019.  She was recommended to continue therapy indefinitely until progression by Dr. Andrey Farmer. -CTCAP on 12/17/2019 shows stable exam with no evidence of focal recurrence or metastatic disease.  Pelvic floor laxity with cystocele. -CTAP with contrast on 07/01/2020 with no residual or recurrent tumor or metastatic disease     Plan: 1.  Stage IVb ovarian cancer: - She is tolerating niraparib very well. - Reviewed labs today: Normal LFTs.  CBC shows anemia with hemoglobin 9.3, slightly worse than before.  CA125 is 6.2. - CTAP (07/04/2023): No evidence of metastatic disease. - Recommend continuing niraparib 300 mg daily. - RTC 3 months for follow-up with repeat CA125 and routine labs.  Will check ferritin and iron panel at that time.   2.  Elevated creatinine: - Creatinine is 1.44.  Likely from niraparib.   3.  Back pain and joint pains: - Continue tramadol 50 mg daily as needed.   4.  Hypertension: - Continue lisinopril, metoprolol and chlorthalidone daily.  Blood pressure is fairly well-controlled. 3:01 PM Patient in no distress, awake,  alert, no decompensation here, no hemodynamic instability, vital signs have been monitored, without change.  Labs reviewed, MRI reviewed, ECG.  MRI without evidence for stroke or cancer progression.  Patient, family comfortable with following up as previously scheduled later this week.        Final Clinical Impression(s) / ED Diagnoses Final diagnoses:  Dizziness    Rx / DC Orders ED Discharge Orders     None         Gerhard Munch, MD 09/09/23 1501

## 2023-09-09 NOTE — Discharge Instructions (Signed)
As discussed, your evaluation today has been largely reassuring.  But, it is important that you monitor your condition carefully, and do not hesitate to return to the ED if you develop new, or concerning changes in your condition. ? ?Otherwise, please follow-up with your physician for appropriate ongoing care. ? ?

## 2023-09-09 NOTE — ED Notes (Signed)
Patient transported to MRI 

## 2023-09-09 NOTE — ED Triage Notes (Signed)
Pt reports dizziness x 1 week. Pt reports this morning she was sitting in her chair, became dizzy, and passed out. Denies pain.

## 2023-09-11 LAB — CA 125: Cancer Antigen (CA) 125: 6.2 U/mL (ref 0.0–38.1)

## 2023-09-12 ENCOUNTER — Other Ambulatory Visit: Payer: Self-pay

## 2023-09-12 NOTE — Progress Notes (Signed)
Specialty Pharmacy Refill Coordination Note  Latasha [name] is a 82 y.o. female contacted today regarding refills of specialty medication(s) [name])   Patient requested Delivery   Delivery date: 09/22/23   Verified address: [address]  [place] [address]   Medication will be filled on 01.02.25.

## 2023-09-14 ENCOUNTER — Inpatient Hospital Stay: Payer: Medicare Other | Attending: Hematology | Admitting: Gynecologic Oncology

## 2023-09-14 ENCOUNTER — Encounter: Payer: Self-pay | Admitting: Gynecologic Oncology

## 2023-09-14 VITALS — BP 164/78 | HR 80 | Temp 97.4°F | Resp 18 | Ht 66.0 in | Wt 152.4 lb

## 2023-09-14 DIAGNOSIS — C786 Secondary malignant neoplasm of retroperitoneum and peritoneum: Secondary | ICD-10-CM | POA: Insufficient documentation

## 2023-09-14 DIAGNOSIS — Z90722 Acquired absence of ovaries, bilateral: Secondary | ICD-10-CM | POA: Insufficient documentation

## 2023-09-14 DIAGNOSIS — I10 Essential (primary) hypertension: Secondary | ICD-10-CM | POA: Insufficient documentation

## 2023-09-14 DIAGNOSIS — Z9079 Acquired absence of other genital organ(s): Secondary | ICD-10-CM | POA: Diagnosis not present

## 2023-09-14 DIAGNOSIS — Z8543 Personal history of malignant neoplasm of ovary: Secondary | ICD-10-CM | POA: Insufficient documentation

## 2023-09-14 DIAGNOSIS — Z79899 Other long term (current) drug therapy: Secondary | ICD-10-CM | POA: Diagnosis not present

## 2023-09-14 DIAGNOSIS — Z90710 Acquired absence of both cervix and uterus: Secondary | ICD-10-CM | POA: Diagnosis not present

## 2023-09-14 DIAGNOSIS — Z9221 Personal history of antineoplastic chemotherapy: Secondary | ICD-10-CM | POA: Insufficient documentation

## 2023-09-14 DIAGNOSIS — C562 Malignant neoplasm of left ovary: Secondary | ICD-10-CM

## 2023-09-14 NOTE — Progress Notes (Signed)
Gynecologic Oncology Return Clinic Visit  09/14/23  Reason for Visit: Surveillance visit in the setting of history of ovarian cancer   Treatment History: Oncology History  Ovarian cancer (HCC)  04/25/2015 - 04/28/2015 Hospital Admission   Nausea/diarrhea.  Oncology consult completed on 04/27/2015.   04/25/2015 Tumor Marker   CA 125- 3902.      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   04/27/2015 Procedure   US Paracentesis- A total of approximately 3700 mL of amber colored fluid was removed. A fluid sample was sent for laboratory analysis.   04/27/2015 Imaging   CT Chest- Mild left supraclavicular lymphadenopathy and moderate mediastinal lymphadenopathy involving the prevascular, subcarinal and bilateral pericardiophrenic nodal chains, likely metastatic.   04/27/2015 Pathology Results   Diagnosis PERITONEAL/ASCITIC FLUID(SPECIMEN 1 OF 1 COLLECTED 04/27/15): MALIGNANT CELLS CONSISTENT WITH METASTATIC ADENOCARCINOMA.  The immunophenotype is most consistent with a gynecologic primary, most likely ovary.   05/08/2015 Miscellaneous   Seen by Dr. Andrey Farmer- recommending Carboplatin/Paclitaxel x 3 cycles and return visit to see her (within 1 week of administration of third cycle) to evaluate for optimal sequencing of treatment modalities.   05/13/2015 - 05/15/2015 Hospital Admission   Hospitalized for AKI   05/19/2015 - 07/01/2015 Chemotherapy   Carboplatin/Paclitaxel x 3 cycles   07/28/2015 Surgery   Exploratory laparotomy with total abdominal hysterectomy, bilateral salpingo-oophorectomy, omentectomy radical tumor debulking for ovarian cancer; by Dr. Adolphus Birchwood.   07/28/2015 Pathology Results   Uterus +/- tubes/ovaries, neoplastic, cervix - HIGH GRADE SEROUS CARCINOMA INVOLVING LEFT OVARY, LEFT FALLOPIAN TUBE AND RIGHT FALLOPIAN TUBE. - CERVIX,  ENDOMETRIUM AND MYOMETRIUM ARE FREE OF TUMOR. 2. Cul-de-sac biop...   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  The Breast/Ovarian gene panel offered by [facility] includes sequencing and rearrangement analysis for the following 20 genes:  ATM, BARD1, BRCA1, BRCA2, BRIP1, CDH1, CHEK2, EPCAM, FANCC, MLH1, MSH2, MSH6, NBN, PALB2, PMS2, PTEN, RAD51C, RAD51D, TP53, and XRCC2.   The report date is 12/14/2015.  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  The updated report date is October 29, 2019.   12/23/2015 Miscellaneous   Genetic counseling.  Genetic testing was normal, and did not reveal a deleterious mutation in these genes   03/07/2017 Imaging   CT C/A/P IMPRESSION: Chest Impression:   No evidence of metastatic disease in thorax.   Abdomen / Pelvis Impression:   1. Interval increase in size of solitary nodular peritoneal implant in the LEFT upper quadrant . 2. No additional evidence of peritoneal nodularity. 3. No ascites.   07/31/2017 Progression   CT C/A/P: IMPRESSION: 1. Left upper quadrant nodule continues to progress, now measuring 20 x 26 mm. 2. New nodule identified between the in adrenal gland, concerning for metastatic deposit. 3. Interval development of tiny nodules in the left lower quadrant mesenteric, concerning for metastases. 4. No ascites. 5. Status post total abdominal hysterectomy and omentectomy.    Latasha [name] is a very pleasant 82 year old G1 who was originally seen in consultation at the request of Dr Loma Messing for (clinical) stage IVB ovarian cancer. The patient developed  symptoms of progressive abdominal fullness and discomfort over the course of  several months. In August, 2016 she presented to the [facility] ER where imaging of the abdomen and pelvis was obtained for diarrhea, nausea and emesis. Imaging on 04/25/15 revealed large volume ascites, carcinomatosis and peritoneal masses measuring up to 5.1cm. The patient underwent paracentesis with cytology on 04/27/15 which revealed metastatic adenocarcinoma consistent on immunostains with gyn primary.   CT of the chest on 04/27/15 revealed mild left supraclavicular lymphadenopathy, and mediastinal lymphadenopathy consistent with metastatic disease. There were <61mm pulmonary nodules also seen which were indeterminant. There were trace pleural effusions seen.    CA 125 on 04/25/15: 3902.   She then went on to receive 3 cycles of paclitaxel and carboplatin chemotherapy with Dr Galen Manila. Cycle 3 was on 07/01/15.   CA 125 on 07/01/15 was 249.   CT scan on 07/20/15 showed: Previously noted ascites has resolved. No pneumoperitoneum. Many of the previously noted peritoneal implants are no longer confidently identified on today's examination. There are few smaller implants noted, the largest of which is in th  left side of the abdomen anteriorly measuring 9 x 11 mm. There is also a 10 x 7 mm lesion superficial to the splenic flexure of the colon which is substantially smaller than the prior study (previously 2.8 x 1.7 cm).   There was resolution of the chest adenopathy.   She has tolerated chemotherapy well with a good appetites and good general function.   On 07/31/15 she was taken to the OR for an ex lap, TAH, BSO, omentectomy, radical tumor debulking. Intraoperative findings included: Excellent response to neoadjuvant chemotherapy. Small volume plaques/nodules in left and right posterior cul de sac behind uterus, tumor on left ovary (3cm), multiple 2cm nodules in omentum. No other peritoneal disease. Diaphragm normal.    She had an optimal/complete cytoreduction (R0) with no gross visible disease  remaining.  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   Postoperatively she did very well with no postoperative complications.   She went on to receive 3 additional adjuvant cycles of carboplatin and paclitaxel completed April, 2017. She tolerated therapy very well.  Post treatment scans were unremarkable and showed no residual disease.   Her CA 125 was monitored at 3 monthly intervals. In January, 2018 it was normal at 17. On April, 20th ,2018 it had increased to 32. On June 15th, 2018 it had increased again to 47. CT chest/abdo/pelvis on 03/07/17 showed solitary pertoneal nodule in the gastrosplenic ligament measuring 18mm x 16mm (increased from a prior scan where it was 1cm). No additional nodularity is noted nor ascites.   She was offered secondary cytoreduction surgery followed by chemotherapy, however she declined this and opted for expectant management because she felt good.   On 07/31/17 repeat CT abdo/pelvis was performed. It showed progression of peritoneal disease with an enlargement of a peri-splenic lesion, a new nodule adjacent to the adrenal gland and a new left lower quadrant peritoneal metastases.   She agreed to commend salvage 2nd line chemotherapy with carboplatin and paclitaxel in December, 2018 due to the onset of symptomatic recurrence. She completed 6 cycles of this on 11/30/17 and was then changed to Niraparib maintenance 300mg  daily starting in March, 2019.   CA 125 was normal at 9 on 03/26/18.  CT scan abd/pelvis in July, 2019 was normal with no evidence of recurrent/progressive disease.  CT scan in December, 2019 was stable with no progression.   CA  125 on 11/15/18 was normal and stable at 6.7.  CT scan abd/pelvis on 11/15/18 showed no new progression or recurrence.    CT abd/pelvis on 07/12/19 showed no evidence of recurrent/progressive disease.   CT chest/abd/pelvis on 12/17/19 showed no evidence of recurrent disease.    CT  scan on 01/08/21 was normal and showed no evidence of recurrence.   CT 10/2022 and most recently in 06/2023 showed no evidence of recurrence.  Port removed in 01/2023.   Interval History: Doing well denies any abdominal or pelvic pain.  Reports good appetite.  Endorses normal bowel and bladder function.  Seen recently in the emergency department for dizziness.  Workup showed persistent chronic anemia.  MRI of the brain negative for stroke or other acute abnormalities.  Dizziness has resolved.  Past Medical/Surgical History: Past Medical History:  Diagnosis Date   Arthritis    B12 deficiency    Cataract    History of blood transfusion    History of chemotherapy    Hypertension    Iron deficiency anemia    Mixed hyperlipidemia    Ovarian cancer (HCC)    Regional enteritis (HCC)    Schizophrenia (HCC)    Ulcerative colitis (HCC)    Vitamin D deficiency     Past Surgical History:  Procedure Laterality Date   ABDOMINAL HYSTERECTOMY N/A 07/28/2015   Procedure: TOTAL HYSTERECTOMY ABDOMINAL BILATERAL SALPINGO OOPHRORECTOMY;  Surgeon: Adolphus Birchwood, MD;  Location: [facility];  Service: Gynecology;  Laterality: N/A;   DEBULKING N/A 07/28/2015   Procedure: DEBULKING;  Surgeon: Adolphus Birchwood, MD;  Location: [facility];  Service: Gynecology;  Laterality: N/A;   IR REMOVAL TUN ACCESS W/ PORT W/O FL MOD SED  01/25/2023   LAPAROTOMY N/A 07/28/2015   Procedure: EXPLORATORY LAPAROTOMY;  Surgeon: Adolphus Birchwood, MD;  Location: [facility];  Service: Gynecology;  Laterality: N/A;   OMENTECTOMY N/A 07/28/2015   Procedure: OMENTECTOMY ;  Surgeon: Adolphus Birchwood, MD;  Location: [facility];  Service: Gynecology;  Laterality: N/A;   PARACENTESIS  04/27/15   PORTACATH PLACEMENT Right 04/2015   REPLACEMENT TOTAL KNEE     right knee in 2003    Family History  Problem Relation Age of Onset   Congestive Heart Failure Mother    Seizures Sister    Prostate cancer Brother    Cancer Paternal Uncle        1 uncle with cancer NOS   Lung  cancer Maternal Grandmother    Hypertension Maternal Grandfather    Colon cancer Neg Hx    Breast cancer Neg Hx    Ovarian cancer Neg Hx    Endometrial cancer Neg Hx    Pancreatic cancer Neg Hx     Social History   Socioeconomic History   Marital status: Single    Spouse name: Not on file   Number of children: 1   Years of education: Not on file   Highest education level: Not on file  Occupational History   Not on file  Tobacco Use   Smoking status: Never   Smokeless tobacco: Never  Vaping Use   Vaping status: Never Used  Substance and Sexual Activity   Alcohol use: No   Drug use: No   Sexual activity: Not Currently  Other Topics Concern   Not on file  Social History Narrative   Not on file   Social Drivers of Health   Financial Resource Strain: Not on file  Food Insecurity: Not on file  Transportation Needs: Not on  file  Physical Activity: Not on file  Stress: Not on file  Social Connections: Not on file    Current Medications:  Current Outpatient Medications:    acetaminophen (TYLENOL) 500 MG tablet, Take 2 tablets (1,000 mg total) by mouth every 12 (twelve) hours., Disp: 30 tablet, Rfl: 0   amLODipine (NORVASC) 10 MG tablet, Take 10 mg by mouth every evening. , Disp: , Rfl:    aspirin EC 81 MG tablet, Take 81 mg by mouth daily., Disp: , Rfl:    chlorthalidone (HYGROTON) 25 MG tablet, Take 25 mg by mouth every morning., Disp: , Rfl:    cholecalciferol (VITAMIN D) 25 MCG (1000 UNIT) tablet, Take 1,000 Units by mouth daily., Disp: , Rfl:    cyanocobalamin (VITAMIN B12) 1000 MCG tablet, TAKE 1 TABLET BY MOUTH ONCE DAILY., Disp: 90 tablet, Rfl: 3   docusate sodium (COLACE) 100 MG capsule, Take 100 mg by mouth 2 (two) times daily., Disp: , Rfl:    folic acid (FOLVITE) 1 MG tablet, Take 1 mg by mouth daily., Disp: , Rfl:    lisinopril (PRINIVIL,ZESTRIL) 40 MG tablet, Take 40 mg by mouth daily. , Disp: , Rfl: 2   loratadine (CLARITIN) 10 MG tablet, Take 10 mg by  mouth daily., Disp: , Rfl:    lovastatin (MEVACOR) 10 MG tablet, Take 10 mg by mouth at bedtime., Disp: , Rfl:    melatonin 5 MG TABS, Take 5 mg by mouth at bedtime., Disp: , Rfl:    metoprolol tartrate (LOPRESSOR) 25 MG tablet, Take 1 tablet (25 mg total) by mouth 2 (two) times daily., Disp: 60 tablet, Rfl: 6   mirtazapine (REMERON) 7.5 MG tablet, Take 7.5 mg by mouth at bedtime., Disp: , Rfl:    Multiple Vitamin (MULTIVITAMIN WITH MINERALS) TABS tablet, Take 1 tablet by mouth daily., Disp: , Rfl:    niraparib tosylate (ZEJULA) 300 MG tablet, Take 1 tablet by mouth daily., Disp: 30 tablet, Rfl: 4   ondansetron (ZOFRAN) 8 MG tablet, Take 1 tablet (8 mg total) by mouth 2 (two) times daily as needed for refractory nausea / vomiting. Start on day 3 after chemo., Disp: 30 tablet, Rfl: 1   prochlorperazine (COMPAZINE) 10 MG tablet, Take 1 tablet (10 mg total) by mouth every 6 (six) hours as needed (Nausea or vomiting)., Disp: 60 tablet, Rfl: 3   QUEtiapine (SEROQUEL XR) 300 MG 24 hr tablet, Take 300 mg by mouth at bedtime., Disp: , Rfl:    sulfaSALAzine (AZULFIDINE) 500 MG tablet, Take 500 mg by mouth 3 (three) times daily., Disp: , Rfl:    traMADol (ULTRAM) 50 MG tablet, Take 1 tablet (50 mg total) by mouth daily as needed., Disp: 60 tablet, Rfl: 0  Review of Systems: Denies appetite changes, fevers, chills, fatigue, unexplained weight changes. Denies hearing loss, neck lumps or masses, mouth sores, ringing in ears or voice changes. Denies cough or wheezing.  Denies shortness of breath. Denies chest pain or palpitations. Denies leg swelling. Denies abdominal distention, pain, blood in stools, constipation, diarrhea, nausea, vomiting, or early satiety. Denies pain with intercourse, dysuria, frequency, hematuria or incontinence. Denies hot flashes, pelvic pain, vaginal bleeding or vaginal discharge.   Denies joint pain, back pain or muscle pain/cramps. Denies itching, rash, or wounds. Denies  dizziness, headaches, numbness or seizures. Denies swollen lymph nodes or glands, denies easy bruising or bleeding. Denies anxiety, depression, confusion, or decreased concentration.  Physical Exam: BP (!) 157/72 (BP Location: Right Arm, Patient Position: Sitting)  Pulse 80   Temp (!) 97.4 F (36.3 C) (Oral)   Resp 18   Ht 5\' 6"  (1.676 m)   Wt 152 lb 6.4 oz (69.1 kg)   SpO2 100%   BMI 24.60 kg/m  General: Alert, oriented, no acute distress. HEENT: Normocephalic, atraumatic, sclera anicteric. Chest: Clear to auscultation bilaterally.  No wheezes or rhonchi.  Port site clean and intact. Cardiovascular: Regular rate and rhythm, no murmurs. Abdomen: nontender.  Normoactive bowel sounds.  No masses or hepatosplenomegaly appreciated.  Well-healed scar. Extremities: Grossly normal range of motion.  Warm, well perfused.  No edema bilaterally. Skin: No rashes or lesions noted. Lymphatics: No cervical, supraclavicular, or inguinal adenopathy. GU: Normal appearing external genitalia without erythema, excoriation, or lesions.  Speculum exam reveals some anterior prolapse, cuff intact, no masses.  Bimanual exam reveals cuff smooth, no masses or nodularity.  Rectovaginal exam confirms findings.  Laboratory & Radiologic Studies:          Component Ref Range & Units (hover) 5 d ago (09/09/23) 2 mo ago (07/04/23) 6 mo ago (03/06/23) 10 mo ago (11/07/22) 1 yr ago (07/06/22) 1 yr ago (03/01/22) 1 yr ago (10/25/21)  Cancer Antigen (CA) 125 6.2 6.2 CM 6.5 CM 5.8 CM 6.4 CM 7.1 CM 6.4 CM     Assessment & Plan: Anniemae Lauff is a 82 y.o. woman with a history of recurrent platinum sensitive ovarian cancer. BRCA negative on testing. S/p salvage 2nd line chemotherapy with carb/taxol completed 11/30/17. Now on Niraparib since March, 2019. Last imaging 06/2023 was negative for metastatic disease. Tolerating maintenance PARPi with Zejula. Recommendation from prior GY[facility] was to continue this indefinitely or  up to five years (2024) as she is tolerating treatment well and is disease free.   Patient is overall doing very well and remains symptom-free.  She is NED on exam today.  Recent CA-12 was normal.   Plan has been continued yearly imaging while she is on Zejula.  Recent imaging was negative for disease recurrence.  Blood pressure was mildly elevated in clinic today.  Recent visit to the emergency department for dizziness.  Encouraged her to follow-up with her primary care provider.  She is asymptomatic today.   She and I reviewed signs and symptoms that would be concerning for disease recurrence and I stressed the importance of calling if she develops any of these before her next visit.  She is now nearly than 6 years out from completion of salvage chemotherapy in the setting of recurrence.  She continues on PARPi maintenance and remains NED.  Needs continued monitoring given risk of late onset MDS/AML.  Plan for follow-up in 1 year in our clinic.  At that time, given continued follow-up with medical oncologist, she can be discharged from our clinic.  20 minutes of total time was spent for this patient encounter, including preparation, face-to-face counseling with the patient and coordination of care, and documentation of the encounter.  Eugene Garnet, MD  Division of Gynecologic Oncology  Department of Obstetrics and Gynecology  [facility]

## 2023-09-14 NOTE — Patient Instructions (Signed)
It was good to see you today.  I do not see or feel any evidence of cancer recurrence on your exam.  We will see you for follow-up in 12 months.  As always, if you develop any new and concerning symptoms before your next visit, please call to see me sooner.

## 2023-09-19 ENCOUNTER — Other Ambulatory Visit: Payer: Self-pay

## 2023-09-21 ENCOUNTER — Other Ambulatory Visit: Payer: Self-pay

## 2023-09-22 ENCOUNTER — Other Ambulatory Visit (HOSPITAL_COMMUNITY): Payer: Self-pay

## 2023-09-25 ENCOUNTER — Other Ambulatory Visit (HOSPITAL_COMMUNITY): Payer: Self-pay

## 2023-09-25 ENCOUNTER — Other Ambulatory Visit: Payer: Self-pay

## 2023-09-26 ENCOUNTER — Other Ambulatory Visit: Payer: Self-pay

## 2023-10-02 ENCOUNTER — Other Ambulatory Visit (HOSPITAL_COMMUNITY): Payer: Self-pay

## 2023-10-11 ENCOUNTER — Inpatient Hospital Stay: Payer: Medicare Other | Attending: Hematology

## 2023-10-11 DIAGNOSIS — R180 Malignant ascites: Secondary | ICD-10-CM | POA: Insufficient documentation

## 2023-10-11 DIAGNOSIS — Z8249 Family history of ischemic heart disease and other diseases of the circulatory system: Secondary | ICD-10-CM | POA: Diagnosis not present

## 2023-10-11 DIAGNOSIS — C563 Malignant neoplasm of bilateral ovaries: Secondary | ICD-10-CM | POA: Insufficient documentation

## 2023-10-11 DIAGNOSIS — Z801 Family history of malignant neoplasm of trachea, bronchus and lung: Secondary | ICD-10-CM | POA: Insufficient documentation

## 2023-10-11 DIAGNOSIS — R7989 Other specified abnormal findings of blood chemistry: Secondary | ICD-10-CM | POA: Diagnosis not present

## 2023-10-11 DIAGNOSIS — Z79899 Other long term (current) drug therapy: Secondary | ICD-10-CM | POA: Insufficient documentation

## 2023-10-11 DIAGNOSIS — M2550 Pain in unspecified joint: Secondary | ICD-10-CM | POA: Diagnosis not present

## 2023-10-11 DIAGNOSIS — E782 Mixed hyperlipidemia: Secondary | ICD-10-CM | POA: Diagnosis not present

## 2023-10-11 DIAGNOSIS — R590 Localized enlarged lymph nodes: Secondary | ICD-10-CM | POA: Insufficient documentation

## 2023-10-11 DIAGNOSIS — F209 Schizophrenia, unspecified: Secondary | ICD-10-CM | POA: Diagnosis not present

## 2023-10-11 DIAGNOSIS — I10 Essential (primary) hypertension: Secondary | ICD-10-CM | POA: Insufficient documentation

## 2023-10-11 DIAGNOSIS — Z820 Family history of epilepsy and other diseases of the nervous system: Secondary | ICD-10-CM | POA: Diagnosis not present

## 2023-10-11 DIAGNOSIS — M549 Dorsalgia, unspecified: Secondary | ICD-10-CM | POA: Insufficient documentation

## 2023-10-11 DIAGNOSIS — Z9221 Personal history of antineoplastic chemotherapy: Secondary | ICD-10-CM | POA: Diagnosis not present

## 2023-10-11 DIAGNOSIS — R42 Dizziness and giddiness: Secondary | ICD-10-CM | POA: Insufficient documentation

## 2023-10-11 DIAGNOSIS — C786 Secondary malignant neoplasm of retroperitoneum and peritoneum: Secondary | ICD-10-CM | POA: Insufficient documentation

## 2023-10-11 DIAGNOSIS — Z8042 Family history of malignant neoplasm of prostate: Secondary | ICD-10-CM | POA: Diagnosis not present

## 2023-10-11 DIAGNOSIS — C562 Malignant neoplasm of left ovary: Secondary | ICD-10-CM

## 2023-10-11 DIAGNOSIS — N993 Prolapse of vaginal vault after hysterectomy: Secondary | ICD-10-CM | POA: Insufficient documentation

## 2023-10-11 DIAGNOSIS — D508 Other iron deficiency anemias: Secondary | ICD-10-CM

## 2023-10-11 LAB — COMPREHENSIVE METABOLIC PANEL
ALT: 24 U/L (ref 0–44)
AST: 31 U/L (ref 15–41)
Albumin: 4.4 g/dL (ref 3.5–5.0)
Alkaline Phosphatase: 82 U/L (ref 38–126)
Anion gap: 10 (ref 5–15)
BUN: 24 mg/dL — ABNORMAL HIGH (ref 8–23)
CO2: 27 mmol/L (ref 22–32)
Calcium: 9.5 mg/dL (ref 8.9–10.3)
Chloride: 100 mmol/L (ref 98–111)
Creatinine, Ser: 1.39 mg/dL — ABNORMAL HIGH (ref 0.44–1.00)
GFR, Estimated: 38 mL/min — ABNORMAL LOW (ref >60)
Glucose, Bld: 103 mg/dL — ABNORMAL HIGH (ref 70–99)
Potassium: 3.8 mmol/L (ref 3.5–5.1)
Sodium: 137 mmol/L (ref 135–145)
Total Bilirubin: 0.6 mg/dL (ref 0.0–1.2)
Total Protein: 7.6 g/dL (ref 6.5–8.1)

## 2023-10-11 LAB — CBC WITH DIFFERENTIAL/PLATELET
Abs Immature Granulocytes: 0.01 10*3/uL (ref 0.00–0.07)
Basophils Absolute: 0.1 10*3/uL (ref 0.0–0.1)
Basophils Relative: 1 %
Eosinophils Absolute: 0.3 10*3/uL (ref 0.0–0.5)
Eosinophils Relative: 4 %
HCT: 30.9 % — ABNORMAL LOW (ref 36.0–46.0)
Hemoglobin: 10.1 g/dL — ABNORMAL LOW (ref 12.0–15.0)
Immature Granulocytes: 0 %
Lymphocytes Relative: 20 %
Lymphs Abs: 1.4 10*3/uL (ref 0.7–4.0)
MCH: 35.9 pg — ABNORMAL HIGH (ref 26.0–34.0)
MCHC: 32.7 g/dL (ref 30.0–36.0)
MCV: 110 fL — ABNORMAL HIGH (ref 80.0–100.0)
Monocytes Absolute: 1 10*3/uL (ref 0.1–1.0)
Monocytes Relative: 15 %
Neutro Abs: 4 10*3/uL (ref 1.7–7.7)
Neutrophils Relative %: 60 %
Platelets: 355 10*3/uL (ref 150–400)
RBC: 2.81 MIL/uL — ABNORMAL LOW (ref 3.87–5.11)
RDW: 13.2 % (ref 11.5–15.5)
WBC: 6.7 10*3/uL (ref 4.0–10.5)
nRBC: 0.3 % — ABNORMAL HIGH (ref 0.0–0.2)

## 2023-10-11 LAB — IRON AND TIBC
Iron: 76 ug/dL (ref 28–170)
Saturation Ratios: 28 % (ref 10.4–31.8)
TIBC: 275 ug/dL (ref 250–450)
UIBC: 199 ug/dL

## 2023-10-11 LAB — FERRITIN: Ferritin: 877 ng/mL — ABNORMAL HIGH (ref 11–307)

## 2023-10-12 ENCOUNTER — Other Ambulatory Visit: Payer: Self-pay

## 2023-10-12 LAB — CA 125: Cancer Antigen (CA) 125: 6.4 U/mL (ref 0.0–38.1)

## 2023-10-16 ENCOUNTER — Other Ambulatory Visit: Payer: Self-pay

## 2023-10-16 ENCOUNTER — Other Ambulatory Visit: Payer: Self-pay | Admitting: Hematology

## 2023-10-16 ENCOUNTER — Other Ambulatory Visit (HOSPITAL_COMMUNITY): Payer: Self-pay

## 2023-10-16 DIAGNOSIS — C562 Malignant neoplasm of left ovary: Secondary | ICD-10-CM

## 2023-10-16 MED ORDER — NIRAPARIB TOSYLATE 300 MG PO TABS
300.0000 mg | ORAL_TABLET | Freq: Every day | ORAL | 4 refills | Status: DC
  Filled 2023-10-16: qty 30, 30d supply, fill #0
  Filled 2023-11-13: qty 30, 30d supply, fill #1
  Filled 2023-12-13: qty 30, 30d supply, fill #2
  Filled 2024-01-15: qty 30, 30d supply, fill #3

## 2023-10-16 NOTE — Progress Notes (Signed)
Specialty Pharmacy Refill Coordination Note  Latasha [name] is a 83 y.o. female contacted today regarding refills of specialty medication(s) Niraparib Tosylate (ZEJULA)   Patient requested Delivery   Delivery date: 10/24/23 (or 2.5)   Verified address: [address]  [place] [address]   Medication will be filled on 02.03.25 or 02.04.25.

## 2023-10-18 ENCOUNTER — Inpatient Hospital Stay (HOSPITAL_BASED_OUTPATIENT_CLINIC_OR_DEPARTMENT_OTHER): Payer: Medicare Other | Admitting: Hematology

## 2023-10-18 VITALS — BP 154/84 | HR 91 | Temp 97.5°F | Resp 18 | Wt 150.6 lb

## 2023-10-18 DIAGNOSIS — D508 Other iron deficiency anemias: Secondary | ICD-10-CM

## 2023-10-18 DIAGNOSIS — C562 Malignant neoplasm of left ovary: Secondary | ICD-10-CM

## 2023-10-18 DIAGNOSIS — D529 Folate deficiency anemia, unspecified: Secondary | ICD-10-CM | POA: Diagnosis not present

## 2023-10-18 DIAGNOSIS — C563 Malignant neoplasm of bilateral ovaries: Secondary | ICD-10-CM | POA: Diagnosis not present

## 2023-10-18 NOTE — Progress Notes (Signed)
Patient is taking Zejula as prescribed.  She has not missed any doses and reports no side effects at this time.

## 2023-10-18 NOTE — Patient Instructions (Signed)
[facility] Discharge Instructions   You were seen and examined today by Dr. Ellin Saba.  He reviewed the results of your lab work which are normal/stable.   Continue Zejula as prescribed.   We will see you back in 3 months. We will repeat lab work prior to this visit.   Return as scheduled.    Thank you for choosing [facility] to provide your oncology and hematology care.  To afford each patient quality time with our provider, please arrive at least 15 minutes before your scheduled appointment time.   If you have a lab appointment with the Cancer Center please come in thru the Main Entrance and check in at the main information desk.  You need to re-schedule your appointment should you arrive 10 or more minutes late.  We strive to give you quality time with our providers, and arriving late affects you and other patients whose appointments are after yours.  Also, if you no show three or more times for appointments you may be dismissed from the clinic at the providers discretion.     Again, thank you for choosing [facility].  Our hope is that these requests will decrease the amount of time that you wait before being seen by our physicians.       _____________________________________________________________  Should you have questions after your visit to [facility], please contact our office at [phone] and follow the prompts.  Our office hours are 8:00 a.m. and 4:30 p.m. Monday - Friday.  Please note that voicemails left after 4:00 p.m. may not be returned until the following business day.  We are closed weekends and major holidays.  You do have access to a nurse 24-7, just call the main number to the clinic [phone] and do not press any options, hold on the line and a nurse will answer the phone.    For prescription refill requests, have your pharmacy contact our office and allow 72  hours.    Due to Covid, you will need to wear a mask upon entering the hospital. If you do not have a mask, a mask will be given to you at the Main Entrance upon arrival. For doctor visits, patients may have 1 support person age 72 or older with them. For treatment visits, patients can not have anyone with them due to social distancing guidelines and our immunocompromised population.

## 2023-10-18 NOTE — Progress Notes (Signed)
[facility], [facility] [address] [place] [address]    Clinic Day:  10/18/2023  Referring physician: Alvina Filbert, MD  Patient Care Team: Alvina Filbert, MD as PCP - General (Internal Medicine)   ASSESSMENT & PLAN:   Assessment: 1.  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 -3 cycles of carboplatin and paclitaxel followed by debulking surgery by Dr. Andrey Farmer on 08/07/2015 followed by 3 more cycles completed on 10/07/2015. -6 cycles of carboplatin and paclitaxel from 08/17/2017 through 11/30/2017 in the second line setting. -Germline mutation testing was negative. -Maintenance Niraparib 300 mg daily started in April 2019.  She was recommended to continue therapy indefinitely until progression by Dr. Andrey Farmer. -CTCAP on 12/17/2019 shows stable exam with no evidence of focal recurrence or metastatic disease.  Pelvic floor laxity with cystocele. -CTAP with contrast on 07/01/2020 with no residual or recurrent tumor or metastatic disease    Plan: 1.  Stage IVb ovarian cancer: - She is tolerating Zejula reasonably well. - She felt dizzy in December.  Brain MRI was done which did not show any acute abnormality. - Reviewed labs today: Creatinine 1.39.  LFTs are normal.  CBC grossly normal.  CA125 is 6.4 and stable. - Last CT AP on 07/04/2023 showed stable scan with no evidence of progression. - She was evaluated by Dr. Pricilla Holm recently. - She has been on maintenance niraparib since April 2019.  This was recommended indefinitely as she was tolerating well by Dr. Andrey Farmer.  We have discussed possibilities of MDS/AML.  She would like to continue with with close follow-ups.  Will see her back in 3 months with repeat labs including CA125.  Will do imaging once a year.   2.  Elevated creatinine: - Creatinine is 1.39 and stable.  Likely from Zejula.   3.  Back pain and joint pains: - Continue tramadol 50 mg daily as needed.   4.  Hypertension: -  Continue metoprolol 25 mg twice daily, Norvasc 10 mg daily and chlorthalidone 25 mg daily.  Blood pressure today is 150/80.  She reports that blood pressure stays at 140 systolic at home.    No orders of the defined types were placed in this encounter.      Doreatha Massed, MD   1/29/20259:53 AM  CHIEF COMPLAINT:   Diagnosis: left ovarian cancer    Cancer Staging  Ovarian cancer [facility]) Staging form: Ovary, AJCC 7th Edition - Clinical stage from 04/27/2015: FIGO Stage IVB, calculated as Stage IV (T3c, N1, M1) - Signed by Ellouise Newer, PA-C on 09/16/2015 - Pathologic stage from 05/08/2015: FIGO Stage IVB, calculated as Stage IV (TX, N1, M1) - Signed by Adolphus Birchwood, MD on 05/08/2015 - Pathologic stage from 07/28/2015: FIGO Stage IV (T3c, NX, M1) - Signed by Ellouise Newer, PA-C on 09/16/2015    Prior Therapy: 1. Carboplatin and paclitaxel x 3 cycles, 05/19/15 - 07/01/15 2. Debulking surgery with TAH-BSO on 08/07/2015. 3. Carboplatin and paclitaxel-- 3 cycles, 08/26/15 - 10/07/15; 6 cycles, 08/17/17 - 11/30/17.  Current Therapy:  Zejula 300 mg daily    HISTORY OF PRESENT ILLNESS:   Oncology History  Ovarian cancer (HCC)  04/25/2015 - 04/28/2015 Hospital Admission   Nausea/diarrhea.  Oncology consult completed on 04/27/2015.   04/25/2015 Tumor Marker   CA 125- 3902.      CEA WNL   04/25/2015 Imaging   CT Abd/pelvis- Significant ascites with multiple peritoneal based soft tissue masses within the pelvis likely representing ovarian  cancer with peritoneal carcinomatosis.   04/27/2015 Procedure   US Paracentesis- A total of approximately 3700 mL of amber colored fluid was removed. A fluid sample was sent for laboratory analysis.   04/27/2015 Imaging   CT Chest- Mild left supraclavicular lymphadenopathy and moderate mediastinal lymphadenopathy involving the prevascular, subcarinal and bilateral pericardiophrenic nodal chains, likely metastatic.   04/27/2015 Pathology Results   Diagnosis  PERITONEAL/ASCITIC FLUID(SPECIMEN 1 OF 1 COLLECTED 04/27/15): MALIGNANT CELLS CONSISTENT WITH METASTATIC ADENOCARCINOMA.  The immunophenotype is most consistent with a gynecologic primary, most likely ovary.   05/08/2015 Miscellaneous   Seen by Dr. Andrey Farmer- recommending Carboplatin/Paclitaxel x 3 cycles and return visit to see her (within 1 week of administration of third cycle) to evaluate for optimal sequencing of treatment modalities.   05/13/2015 - 05/15/2015 Hospital Admission   Hospitalized for AKI   05/19/2015 - 07/01/2015 Chemotherapy   Carboplatin/Paclitaxel x 3 cycles   07/28/2015 Surgery   Exploratory laparotomy with total abdominal hysterectomy, bilateral salpingo-oophorectomy, omentectomy radical tumor debulking for ovarian cancer; by Dr. Adolphus Birchwood.   07/28/2015 Pathology Results   Uterus +/- tubes/ovaries, neoplastic, cervix - HIGH GRADE SEROUS CARCINOMA INVOLVING LEFT OVARY, LEFT FALLOPIAN TUBE AND RIGHT FALLOPIAN TUBE. - CERVIX, ENDOMETRIUM AND MYOMETRIUM ARE FREE OF TUMOR. 2. Cul-de-sac biop...   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  The Breast/Ovarian gene panel offered by [facility] includes sequencing and rearrangement analysis for the following 20 genes:  ATM, BARD1, BRCA1, BRCA2, BRIP1, CDH1, CHEK2, EPCAM, FANCC, MLH1, MSH2, MSH6, NBN, PALB2, PMS2, PTEN, RAD51C, RAD51D, TP53, and XRCC2.   The report date is 12/14/2015.  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  The updated report date is October 29, 2019.   12/23/2015 Miscellaneous   Genetic counseling.  Genetic testing was normal, and did not reveal a deleterious mutation in these genes   03/07/2017 Imaging   CT C/A/P  IMPRESSION: Chest Impression:   No evidence of metastatic disease in thorax.   Abdomen / Pelvis Impression:   1. Interval increase in size of solitary nodular peritoneal implant in the LEFT upper quadrant . 2. No additional evidence of peritoneal nodularity. 3. No ascites.   07/31/2017 Progression   CT C/A/P: IMPRESSION: 1. Left upper quadrant nodule continues to progress, now measuring 20 x 26 mm. 2. New nodule identified between the in adrenal gland, concerning for metastatic deposit. 3. Interval development of tiny nodules in the left lower quadrant mesenteric, concerning for metastases. 4. No ascites. 5. Status post total abdominal hysterectomy and omentectomy.      INTERVAL HISTORY:   Latasha [name] is a 83 y.o. female seen for follow-up of left ovarian cancer.  She is tolerating Zejula very well.  Appetite and energy levels are 75%.  She had dizziness in December which has improved.  PAST MEDICAL HISTORY:   Past Medical History: Past Medical History:  Diagnosis Date   Arthritis    B12 deficiency    Cataract    History of blood transfusion    History of chemotherapy    Hypertension    Iron deficiency anemia    Mixed hyperlipidemia    Ovarian cancer (HCC)    Regional enteritis (HCC)    Schizophrenia (HCC)  Ulcerative colitis (HCC)    Vitamin D deficiency     Surgical History: Past Surgical History:  Procedure Laterality Date   ABDOMINAL HYSTERECTOMY N/A 07/28/2015   Procedure: TOTAL HYSTERECTOMY ABDOMINAL BILATERAL SALPINGO OOPHRORECTOMY;  Surgeon: Adolphus Birchwood, MD;  Location: [facility];  Service: Gynecology;  Laterality: N/A;   DEBULKING N/A 07/28/2015   Procedure: DEBULKING;  Surgeon: Adolphus Birchwood, MD;  Location: [facility];  Service: Gynecology;  Laterality: N/A;   IR REMOVAL TUN ACCESS W/ PORT W/O FL MOD SED  01/25/2023   LAPAROTOMY N/A 07/28/2015   Procedure: EXPLORATORY LAPAROTOMY;  Surgeon: Adolphus Birchwood, MD;  Location: [facility];  Service: Gynecology;  Laterality: N/A;    OMENTECTOMY N/A 07/28/2015   Procedure: OMENTECTOMY ;  Surgeon: Adolphus Birchwood, MD;  Location: [facility];  Service: Gynecology;  Laterality: N/A;   PARACENTESIS  04/27/15   PORTACATH PLACEMENT Right 04/2015   REPLACEMENT TOTAL KNEE     right knee in 2003    Social History: Social History   Socioeconomic History   Marital status: Single    Spouse name: Not on file   Number of children: 1   Years of education: Not on file   Highest education level: Not on file  Occupational History   Not on file  Tobacco Use   Smoking status: Never   Smokeless tobacco: Never  Vaping Use   Vaping status: Never Used  Substance and Sexual Activity   Alcohol use: No   Drug use: No   Sexual activity: Not Currently  Other Topics Concern   Not on file  Social History Narrative   Not on file   Social Drivers of Health   Financial Resource Strain: Not on file  Food Insecurity: Not on file  Transportation Needs: Not on file  Physical Activity: Not on file  Stress: Not on file  Social Connections: Not on file  Intimate Partner Violence: Not on file    Family History: Family History  Problem Relation Age of Onset   Congestive Heart Failure Mother    Seizures Sister    Prostate cancer Brother    Cancer Paternal Uncle        1 uncle with cancer NOS   Lung cancer Maternal Grandmother    Hypertension Maternal Grandfather    Colon cancer Neg Hx    Breast cancer Neg Hx    Ovarian cancer Neg Hx    Endometrial cancer Neg Hx    Pancreatic cancer Neg Hx     Current Medications:  Current Outpatient Medications:    acetaminophen (TYLENOL) 500 MG tablet, Take 2 tablets (1,000 mg total) by mouth every 12 (twelve) hours., Disp: 30 tablet, Rfl: 0   amLODipine (NORVASC) 10 MG tablet, Take 10 mg by mouth every evening. , Disp: , Rfl:    aspirin EC 81 MG tablet, Take 81 mg by mouth daily., Disp: , Rfl:    chlorthalidone (HYGROTON) 25 MG tablet, Take 25 mg by mouth every morning., Disp: , Rfl:     cholecalciferol (VITAMIN D) 25 MCG (1000 UNIT) tablet, Take 1,000 Units by mouth daily., Disp: , Rfl:    cyanocobalamin (VITAMIN B12) 1000 MCG tablet, TAKE 1 TABLET BY MOUTH ONCE DAILY., Disp: 90 tablet, Rfl: 3   docusate sodium (COLACE) 100 MG capsule, Take 100 mg by mouth 2 (two) times daily., Disp: , Rfl:    folic acid (FOLVITE) 1 MG tablet, Take 1 mg by mouth daily., Disp: , Rfl:    loratadine (CLARITIN) 10 MG  tablet, Take 10 mg by mouth daily., Disp: , Rfl:    lovastatin (MEVACOR) 10 MG tablet, Take 10 mg by mouth at bedtime., Disp: , Rfl:    melatonin 5 MG TABS, Take 5 mg by mouth at bedtime., Disp: , Rfl:    metoprolol tartrate (LOPRESSOR) 25 MG tablet, Take 1 tablet (25 mg total) by mouth 2 (two) times daily., Disp: 60 tablet, Rfl: 6   mirtazapine (REMERON) 7.5 MG tablet, Take 7.5 mg by mouth at bedtime., Disp: , Rfl:    Multiple Vitamin (MULTIVITAMIN WITH MINERALS) TABS tablet, Take 1 tablet by mouth daily., Disp: , Rfl:    niraparib tosylate (ZEJULA) 300 MG tablet, Take 1 tablet by mouth daily., Disp: 30 tablet, Rfl: 4   ondansetron (ZOFRAN) 8 MG tablet, Take 1 tablet (8 mg total) by mouth 2 (two) times daily as needed for refractory nausea / vomiting. Start on day 3 after chemo., Disp: 30 tablet, Rfl: 1   prochlorperazine (COMPAZINE) 10 MG tablet, Take 1 tablet (10 mg total) by mouth every 6 (six) hours as needed (Nausea or vomiting)., Disp: 60 tablet, Rfl: 3   QUEtiapine (SEROQUEL XR) 300 MG 24 hr tablet, Take 300 mg by mouth at bedtime., Disp: , Rfl:    sulfaSALAzine (AZULFIDINE) 500 MG tablet, Take 500 mg by mouth 3 (three) times daily., Disp: , Rfl:    traMADol (ULTRAM) 50 MG tablet, Take 1 tablet (50 mg total) by mouth daily as needed., Disp: 60 tablet, Rfl: 0   Allergies: No Known Allergies  REVIEW OF SYSTEMS:   Review of Systems  Constitutional:  Negative for chills, fatigue and fever.  HENT:   Negative for lump/mass, mouth sores, nosebleeds, sore throat and trouble  swallowing.   Eyes:  Negative for eye problems.  Respiratory:  Negative for cough and shortness of breath.   Cardiovascular:  Negative for chest pain, leg swelling and palpitations.  Gastrointestinal:  Negative for abdominal pain, constipation, diarrhea, nausea and vomiting.  Genitourinary:  Negative for bladder incontinence, difficulty urinating, dysuria, frequency, hematuria and nocturia.   Musculoskeletal:  Negative for arthralgias, back pain, flank pain, myalgias and neck pain.  Skin:  Negative for itching and rash.  Neurological:  Positive for dizziness. Negative for headaches and numbness.  Hematological:  Does not bruise/bleed easily.  Psychiatric/Behavioral:  Negative for depression, sleep disturbance and suicidal ideas. The patient is not nervous/anxious.   All other systems reviewed and are negative.    VITALS:   Blood pressure (!) 154/84, pulse 91, temperature (!) 97.5 F (36.4 C), temperature source Oral, resp. rate 18, weight 150 lb 9.2 oz (68.3 kg), SpO2 98%.  Wt Readings from Last 3 Encounters:  10/18/23 150 lb 9.2 oz (68.3 kg)  09/14/23 152 lb 6.4 oz (69.1 kg)  07/11/23 155 lb 12.8 oz (70.7 kg)    Body mass index is 24.3 kg/m.  Performance status (ECOG): 1 - Symptomatic but completely ambulatory  PHYSICAL EXAM:   Physical Exam Vitals and nursing note reviewed. Exam conducted with a chaperone present.  Constitutional:      Appearance: Normal appearance.  Cardiovascular:     Rate and Rhythm: Normal rate and regular rhythm.     Pulses: Normal pulses.     Heart sounds: Normal heart sounds.  Pulmonary:     Effort: Pulmonary effort is normal.     Breath sounds: Normal breath sounds.  Abdominal:     Palpations: Abdomen is soft. There is no hepatomegaly, splenomegaly or mass.  Tenderness: There is no abdominal tenderness.  Musculoskeletal:     Right lower leg: No edema.     Left lower leg: No edema.  Lymphadenopathy:     Cervical: No cervical adenopathy.      Right cervical: No superficial, deep or posterior cervical adenopathy.    Left cervical: No superficial, deep or posterior cervical adenopathy.     Upper Body:     Right upper body: No supraclavicular or axillary adenopathy.     Left upper body: No supraclavicular or axillary adenopathy.  Neurological:     General: No focal deficit present.     Mental Status: She is alert and oriented to person, place, and time.  Psychiatric:        Mood and Affect: Mood normal.        Behavior: Behavior normal.     LABS:      Latest Ref Rng & Units 10/11/2023   10:05 AM 09/09/2023   11:38 AM 07/04/2023   11:03 AM  CBC  WBC 4.0 - 10.5 K/uL 6.7  6.5  6.6   Hemoglobin 12.0 - 15.0 g/dL 16.1  9.2  9.3   Hematocrit 36.0 - 46.0 % 30.9  28.5  28.6   Platelets 150 - 400 K/uL 355  297  319       Latest Ref Rng & Units 10/11/2023   10:05 AM 09/09/2023   11:38 AM 07/04/2023   11:03 AM  CMP  Glucose 70 - 99 mg/dL 096  045  409   BUN 8 - 23 mg/dL 24  17  17    Creatinine 0.44 - 1.00 mg/dL 8.11  9.14  7.82   Sodium 135 - 145 mmol/L 137  138  136   Potassium 3.5 - 5.1 mmol/L 3.8  4.1  4.3   Chloride 98 - 111 mmol/L 100  104  100   CO2 22 - 32 mmol/L 27  27  26    Calcium 8.9 - 10.3 mg/dL 9.5  9.5  9.1   Total Protein 6.5 - 8.1 g/dL 7.6  7.0  7.6   Total Bilirubin 0.0 - 1.2 mg/dL 0.6  0.6  0.7   Alkaline Phos 38 - 126 U/L 82  72  72   AST 15 - 41 U/L 31  34  37   ALT 0 - 44 U/L 24  27  24       Lab Results  Component Value Date   CEA 0.5 04/26/2015   /  CEA  Date Value Ref Range Status  04/26/2015 0.5 0.0 - 4.7 ng/mL Final    Comment:    (NOTE)       Roche ECLIA methodology       Nonsmokers  <3.9                                     Smokers     <5.6 Performed At: [facility] [address] [place], [place] [id number] Mila Homer MD VH:[id number]    No results found for: "PSA1" No results found for: "CAN199" Lab Results  Component Value Date   CAN125 6.4 10/11/2023     Lab Results  Component Value Date   TOTALPROTELP 7.1 08/26/2015   ALBUMINELP 4.0 08/26/2015   A1GS 0.2 08/26/2015   A2GS 0.7 08/26/2015   BETS 1.0 08/26/2015   GAMS 1.2 08/26/2015   MSPIKE Not Observed 08/26/2015   SPEI  Comment 08/26/2015   Lab Results  Component Value Date   TIBC 275 10/11/2023   TIBC 252 03/06/2023   TIBC 255 07/06/2022   FERRITIN 877 (H) 10/11/2023   FERRITIN 827 (H) 03/06/2023   FERRITIN 842 (H) 07/06/2022   IRONPCTSAT 28 10/11/2023   IRONPCTSAT 30 03/06/2023   IRONPCTSAT 28 07/06/2022   Lab Results  Component Value Date   LDH 219 (H) 03/24/2020   LDH 198 (H) 11/15/2018   LDH 185 08/13/2018     STUDIES:   No results found.

## 2023-11-13 ENCOUNTER — Other Ambulatory Visit: Payer: Self-pay | Admitting: Pharmacy Technician

## 2023-11-13 ENCOUNTER — Other Ambulatory Visit: Payer: Self-pay

## 2023-11-13 NOTE — Progress Notes (Signed)
 Specialty Pharmacy Refill Coordination Note  Latasha [name] is a 83 y.o. female contacted today regarding refills of specialty medication(s) Niraparib Tosylate [name])   Patient requested Delivery   Delivery date: 11/22/23   Verified address: [address]  [place]   Medication will be filled on 11/21/23.

## 2023-11-21 ENCOUNTER — Other Ambulatory Visit: Payer: Self-pay

## 2023-11-22 ENCOUNTER — Other Ambulatory Visit: Payer: Self-pay

## 2023-11-24 ENCOUNTER — Other Ambulatory Visit: Payer: Self-pay | Admitting: Hematology

## 2023-11-24 DIAGNOSIS — C562 Malignant neoplasm of left ovary: Secondary | ICD-10-CM

## 2023-12-13 ENCOUNTER — Other Ambulatory Visit: Payer: Self-pay

## 2023-12-13 ENCOUNTER — Other Ambulatory Visit: Payer: Self-pay | Admitting: Pharmacy Technician

## 2023-12-13 NOTE — Progress Notes (Signed)
 Specialty Pharmacy Refill Coordination Note  Latasha [name] is a 83 y.o. female contacted today regarding refills of specialty medication(s) Niraparib Tosylate [name])   Patient requested Delivery   Delivery date: 12/26/23   Verified address: [address] [place]   Medication will be filled on 12/25/23.

## 2023-12-18 ENCOUNTER — Other Ambulatory Visit (HOSPITAL_COMMUNITY): Payer: Self-pay

## 2023-12-25 ENCOUNTER — Other Ambulatory Visit: Payer: Self-pay

## 2024-01-10 ENCOUNTER — Inpatient Hospital Stay: Payer: Medicare Other | Attending: Hematology

## 2024-01-10 DIAGNOSIS — C562 Malignant neoplasm of left ovary: Secondary | ICD-10-CM

## 2024-01-10 DIAGNOSIS — C786 Secondary malignant neoplasm of retroperitoneum and peritoneum: Secondary | ICD-10-CM | POA: Diagnosis present

## 2024-01-10 DIAGNOSIS — D649 Anemia, unspecified: Secondary | ICD-10-CM | POA: Insufficient documentation

## 2024-01-10 DIAGNOSIS — C563 Malignant neoplasm of bilateral ovaries: Secondary | ICD-10-CM | POA: Diagnosis present

## 2024-01-10 DIAGNOSIS — Z79899 Other long term (current) drug therapy: Secondary | ICD-10-CM | POA: Diagnosis not present

## 2024-01-10 DIAGNOSIS — D508 Other iron deficiency anemias: Secondary | ICD-10-CM

## 2024-01-10 DIAGNOSIS — I10 Essential (primary) hypertension: Secondary | ICD-10-CM | POA: Diagnosis not present

## 2024-01-10 DIAGNOSIS — D529 Folate deficiency anemia, unspecified: Secondary | ICD-10-CM

## 2024-01-10 LAB — CBC WITH DIFFERENTIAL/PLATELET
Abs Immature Granulocytes: 0.01 10*3/uL (ref 0.00–0.07)
Basophils Absolute: 0.1 10*3/uL (ref 0.0–0.1)
Basophils Relative: 2 %
Eosinophils Absolute: 0.3 10*3/uL (ref 0.0–0.5)
Eosinophils Relative: 6 %
HCT: 29.1 % — ABNORMAL LOW (ref 36.0–46.0)
Hemoglobin: 9.7 g/dL — ABNORMAL LOW (ref 12.0–15.0)
Immature Granulocytes: 0 %
Lymphocytes Relative: 27 %
Lymphs Abs: 1.3 10*3/uL (ref 0.7–4.0)
MCH: 36.5 pg — ABNORMAL HIGH (ref 26.0–34.0)
MCHC: 33.3 g/dL (ref 30.0–36.0)
MCV: 109.4 fL — ABNORMAL HIGH (ref 80.0–100.0)
Monocytes Absolute: 0.7 10*3/uL (ref 0.1–1.0)
Monocytes Relative: 15 %
Neutro Abs: 2.4 10*3/uL (ref 1.7–7.7)
Neutrophils Relative %: 50 %
Platelets: 297 10*3/uL (ref 150–400)
RBC: 2.66 MIL/uL — ABNORMAL LOW (ref 3.87–5.11)
RDW: 13.2 % (ref 11.5–15.5)
WBC: 4.8 10*3/uL (ref 4.0–10.5)
nRBC: 0 % (ref 0.0–0.2)

## 2024-01-10 LAB — IRON AND TIBC
Iron: 108 ug/dL (ref 28–170)
Saturation Ratios: 40 % — ABNORMAL HIGH (ref 10.4–31.8)
TIBC: 272 ug/dL (ref 250–450)
UIBC: 164 ug/dL

## 2024-01-10 LAB — COMPREHENSIVE METABOLIC PANEL WITH GFR
ALT: 29 U/L (ref 0–44)
AST: 34 U/L (ref 15–41)
Albumin: 4.4 g/dL (ref 3.5–5.0)
Alkaline Phosphatase: 77 U/L (ref 38–126)
Anion gap: 10 (ref 5–15)
BUN: 27 mg/dL — ABNORMAL HIGH (ref 8–23)
CO2: 26 mmol/L (ref 22–32)
Calcium: 9.7 mg/dL (ref 8.9–10.3)
Chloride: 102 mmol/L (ref 98–111)
Creatinine, Ser: 2.1 mg/dL — ABNORMAL HIGH (ref 0.44–1.00)
GFR, Estimated: 23 mL/min — ABNORMAL LOW (ref >60)
Glucose, Bld: 102 mg/dL — ABNORMAL HIGH (ref 70–99)
Potassium: 3.7 mmol/L (ref 3.5–5.1)
Sodium: 138 mmol/L (ref 135–145)
Total Bilirubin: 0.6 mg/dL (ref 0.0–1.2)
Total Protein: 7.8 g/dL (ref 6.5–8.1)

## 2024-01-10 LAB — VITAMIN B12: Vitamin B-12: 923 pg/mL — ABNORMAL HIGH (ref 180–914)

## 2024-01-10 LAB — FERRITIN: Ferritin: 784 ng/mL — ABNORMAL HIGH (ref 11–307)

## 2024-01-11 ENCOUNTER — Other Ambulatory Visit: Payer: Self-pay

## 2024-01-11 LAB — CA 125: Cancer Antigen (CA) 125: 7.3 U/mL (ref 0.0–38.1)

## 2024-01-13 LAB — COPPER, SERUM: Copper: 113 ug/dL (ref 80–158)

## 2024-01-14 LAB — METHYLMALONIC ACID, SERUM: Methylmalonic Acid, Quantitative: 226 nmol/L (ref 0–378)

## 2024-01-15 ENCOUNTER — Other Ambulatory Visit (HOSPITAL_COMMUNITY): Payer: Self-pay

## 2024-01-15 NOTE — Progress Notes (Signed)
 Specialty Pharmacy Ongoing Clinical Assessment Note  Latasha [name] is a 83 y.o. female who is being followed by the specialty pharmacy service for RxSp Oncology   Patient's specialty medication(s) reviewed today: Niraparib  Tosylate (ZEJULA )   Missed doses in the last 4 weeks: 0   Patient/Caregiver did not have any additional questions or concerns.   Therapeutic benefit summary: Patient is achieving benefit   Adverse events/side effects summary: No adverse events/side effects   Patient's therapy is appropriate to: Continue    Goals Addressed             This Visit's Progress    Achieve a cure   On track    Patient is on track. Patient will maintain adherence. Per provider notes no evidence of metastatic disease from 07/04/23 scans.          Follow up:  6 months  Malachi Screws Specialty Pharmacist

## 2024-01-15 NOTE — Progress Notes (Signed)
 Specialty Pharmacy Refill Coordination Note  Latasha [name] is a 83 y.o. female contacted today regarding refills of specialty medication(s) Niraparib  Tosylate (ZEJULA )   Patient requested Delivery   Delivery date: 01/23/24   Verified address: [address] [place]   Medication will be filled on 01/22/24.

## 2024-01-17 ENCOUNTER — Inpatient Hospital Stay (HOSPITAL_BASED_OUTPATIENT_CLINIC_OR_DEPARTMENT_OTHER): Payer: Medicare Other | Admitting: Hematology

## 2024-01-17 ENCOUNTER — Other Ambulatory Visit: Payer: Self-pay

## 2024-01-17 DIAGNOSIS — C563 Malignant neoplasm of bilateral ovaries: Secondary | ICD-10-CM | POA: Diagnosis not present

## 2024-01-17 DIAGNOSIS — C562 Malignant neoplasm of left ovary: Secondary | ICD-10-CM | POA: Diagnosis not present

## 2024-01-17 MED ORDER — NIRAPARIB TOSYLATE 200 MG PO TABS
200.0000 mg | ORAL_TABLET | Freq: Every day | ORAL | 5 refills | Status: DC
  Filled 2024-01-17: qty 30, 30d supply, fill #0
  Filled 2024-02-06: qty 30, 30d supply, fill #1
  Filled 2024-03-11: qty 30, 30d supply, fill #2
  Filled 2024-04-11: qty 30, 30d supply, fill #3
  Filled 2024-05-03 – 2024-05-06 (×2): qty 30, 30d supply, fill #4
  Filled 2024-06-11: qty 30, 30d supply, fill #5

## 2024-01-17 NOTE — Progress Notes (Signed)
 [facility] [address] [place] [address]    Clinic Day:  01/17/2024  Referring physician: Twylla Galen, MD  Patient Care Team: Twylla Galen, MD as PCP - General (Internal Medicine)   ASSESSMENT & PLAN:   Assessment: 1.  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 -3 cycles of carboplatin  and paclitaxel  followed by debulking surgery by Dr. Pearly Bound on 08/07/2015 followed by 3 more cycles completed on 10/07/2015. -6 cycles of carboplatin  and paclitaxel  from 08/17/2017 through 11/30/2017 in the second line setting. -Germline mutation testing was negative. -Maintenance Niraparib  300 mg daily started in April 2019.  She was recommended to continue therapy indefinitely until progression by Dr. Pearly Bound. - She also had discussion with Dr. Orvil Bland regarding continuation of niraparib  and decided to continue it until progression.    Plan: 1.  Stage IVb ovarian cancer: - She is taking niraparib  300 mg daily. - Reviewed labs today: Normal LFTs.  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  B12, MMA, copper, iron levels were within normal limits.  CA125 is 7.3.  Platelets are 297. - As her weight is 68 kilograms, I will cut back on the niraparib  dose to 200 mg daily. - RTC follow-up in 3 months with repeat labs and CA125.  Will repeat imaging in October.   2.  Elevated creatinine: - Her baseline creatinine is around 1.2-1.4.  It has suddenly increased to 2.1.  Part of the elevated creatinine is from niraparib .  However will seek consultation from Dr. Carrolyn Clan.   3.  Back pain and joint pains: - Continue tramadol  50 mg daily as needed.   4.  Hypertension: - She is taking amlodipine  10 mg daily, chlorthalidone 25 mg daily and metoprolol  25 mg twice daily.  Lisinopril  was decreased from 40 mg to 20 mg daily by Dr. Arlene Ben on 12/22/2023.    No orders of the defined types were placed in this encounter.    Nadeen Augusta Teague,acting  as a Neurosurgeon for Paulett Boros, MD.,have documented all relevant documentation on the behalf of Paulett Boros, MD,as directed by  Paulett Boros, MD while in the presence of Paulett Boros, MD.  I, Paulett Boros MD, have reviewed the above documentation for accuracy and completeness, and I agree with the above.    Paulett Boros, MD   4/30/202511:35 AM  CHIEF COMPLAINT:   Diagnosis: left ovarian cancer    Cancer Staging  Ovarian cancer [facility]) Staging form: Ovary, AJCC 7th Edition - Clinical stage from 04/27/2015: FIGO Stage IVB, calculated as Stage IV (T3c, N1, M1) - Signed by Doretta Gant, PA-C on 09/16/2015 - Pathologic stage from 05/08/2015: FIGO Stage IVB, calculated as Stage IV (TX, N1, M1) - Signed by Alphonso Aschoff, MD on 05/08/2015 - Pathologic stage from 07/28/2015: FIGO Stage IV (T3c, NX, M1) - Signed by Doretta Gant, PA-C on 09/16/2015    Prior Therapy: 1. Carboplatin  and paclitaxel  x 3 cycles, 05/19/15 - 07/01/15 2. Debulking surgery with TAH-BSO on 08/07/2015. 3. Carboplatin  and paclitaxel -- 3 cycles, 08/26/15 - 10/07/15; 6 cycles, 08/17/17 - 11/30/17.  Current Therapy:  Zejula  300 mg daily    HISTORY OF PRESENT ILLNESS:   Oncology History  Ovarian cancer (HCC)  04/25/2015 - 04/28/2015 Hospital Admission   Nausea/diarrhea.  Oncology consult completed on 04/27/2015.   04/25/2015 Tumor Marker   CA 125- 3902.      CEA WNL   04/25/2015 Imaging   CT Abd/pelvis- Significant ascites with multiple peritoneal  based soft tissue masses within the pelvis likely representing ovarian cancer with peritoneal carcinomatosis.   04/27/2015 Procedure   US  Paracentesis- A total of approximately 3700 mL of amber colored fluid was removed. A fluid sample was sent for laboratory analysis.   04/27/2015 Imaging   CT Chest- Mild left supraclavicular lymphadenopathy and moderate mediastinal lymphadenopathy involving the prevascular, subcarinal and bilateral  pericardiophrenic nodal chains, likely metastatic.   04/27/2015 Pathology Results   Diagnosis PERITONEAL/ASCITIC FLUID(SPECIMEN 1 OF 1 COLLECTED 04/27/15): MALIGNANT CELLS CONSISTENT WITH METASTATIC ADENOCARCINOMA.  The immunophenotype is most consistent with a gynecologic primary, most likely ovary.   05/08/2015 Miscellaneous   Seen by Dr. Pearly Bound- recommending Carboplatin /Paclitaxel  x 3 cycles and return visit to see her (within 1 week of administration of third cycle) to evaluate for optimal sequencing of treatment modalities.   05/13/2015 - 05/15/2015 Hospital Admission   Hospitalized for AKI   05/19/2015 - 07/01/2015 Chemotherapy   Carboplatin /Paclitaxel  x 3 cycles   07/28/2015 Surgery   Exploratory laparotomy with total abdominal hysterectomy, bilateral salpingo-oophorectomy, omentectomy radical tumor debulking for ovarian cancer; by Dr. Alphonso Aschoff.   07/28/2015 Pathology Results   Uterus +/- tubes/ovaries, neoplastic, cervix - HIGH GRADE SEROUS CARCINOMA INVOLVING LEFT OVARY, LEFT FALLOPIAN TUBE AND RIGHT FALLOPIAN TUBE. - CERVIX, ENDOMETRIUM AND MYOMETRIUM ARE FREE OF TUMOR. 2. Cul-de-sac biop...   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  The Breast/Ovarian gene panel offered by [facility] includes sequencing and rearrangement analysis for the following 20 genes:  ATM, BARD1, BRCA1, BRCA2, BRIP1, CDH1, CHEK2, EPCAM, FANCC, MLH1, MSH2, MSH6, NBN, PALB2, PMS2, PTEN, RAD51C, RAD51D, TP53, and XRCC2.   The report date is 12/14/2015.  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  The updated report date is October 29, 2019.   12/23/2015 Miscellaneous   Genetic counseling.  Genetic testing was normal,  and did not reveal a deleterious mutation in these genes   03/07/2017 Imaging   CT C/A/P IMPRESSION: Chest Impression:   No evidence of metastatic disease in thorax.   Abdomen / Pelvis Impression:   1. Interval increase in size of solitary nodular peritoneal implant in the LEFT upper quadrant . 2. No additional evidence of peritoneal nodularity. 3. No ascites.   07/31/2017 Progression   CT C/A/P: IMPRESSION: 1. Left upper quadrant nodule continues to progress, now measuring 20 x 26 mm. 2. New nodule identified between the in adrenal gland, concerning for metastatic deposit. 3. Interval development of tiny nodules in the left lower quadrant mesenteric, concerning for metastases. 4. No ascites. 5. Status post total abdominal hysterectomy and omentectomy.      INTERVAL HISTORY:   Latasha [name] is a 83 y.o. female seen for follow-up of left ovarian cancer.  She was last seen by me on 10/18/23.  Today, she states that she is doing well overall. Her appetite level is at 75%. Her energy level is at 75%.   PAST MEDICAL HISTORY:   Past Medical History: Past Medical History:  Diagnosis Date   Arthritis    B12 deficiency    Cataract    History of blood transfusion    History of chemotherapy    Hypertension    Iron deficiency anemia    Mixed hyperlipidemia  Ovarian cancer (HCC)    Regional enteritis (HCC)    Schizophrenia (HCC)    Ulcerative colitis (HCC)    Vitamin D  deficiency     Surgical History: Past Surgical History:  Procedure Laterality Date   ABDOMINAL HYSTERECTOMY N/A 07/28/2015   Procedure: TOTAL HYSTERECTOMY ABDOMINAL BILATERAL SALPINGO OOPHRORECTOMY;  Surgeon: Alphonso Aschoff, MD;  Location: [facility];  Service: Gynecology;  Laterality: N/A;   DEBULKING N/A 07/28/2015   Procedure: DEBULKING;  Surgeon: Alphonso Aschoff, MD;  Location: [facility];  Service: Gynecology;  Laterality: N/A;   IR REMOVAL TUN ACCESS W/ PORT W/O FL MOD SED  01/25/2023   LAPAROTOMY N/A 07/28/2015   Procedure:  EXPLORATORY LAPAROTOMY;  Surgeon: Alphonso Aschoff, MD;  Location: [facility];  Service: Gynecology;  Laterality: N/A;   OMENTECTOMY N/A 07/28/2015   Procedure: OMENTECTOMY ;  Surgeon: Alphonso Aschoff, MD;  Location: [facility];  Service: Gynecology;  Laterality: N/A;   PARACENTESIS  04/27/15   PORTACATH PLACEMENT Right 04/2015   REPLACEMENT TOTAL KNEE     right knee in 2003    Social History: Social History   Socioeconomic History   Marital status: Single    Spouse name: Not on file   Number of children: 1   Years of education: Not on file   Highest education level: Not on file  Occupational History   Not on file  Tobacco Use   Smoking status: Never   Smokeless tobacco: Never  Vaping Use   Vaping status: Never Used  Substance and Sexual Activity   Alcohol use: No   Drug use: No   Sexual activity: Not Currently  Other Topics Concern   Not on file  Social History Narrative   Not on file   Social Drivers of Health   Financial Resource Strain: Not on file  Food Insecurity: Not on file  Transportation Needs: Not on file  Physical Activity: Not on file  Stress: Not on file  Social Connections: Not on file  Intimate Partner Violence: Not on file    Family History: Family History  Problem Relation Age of Onset   Congestive Heart Failure Mother    Seizures Sister    Prostate cancer Brother    Cancer Paternal Uncle        1 uncle with cancer NOS   Lung cancer Maternal Grandmother    Hypertension Maternal Grandfather    Colon cancer Neg Hx    Breast cancer Neg Hx    Ovarian cancer Neg Hx    Endometrial cancer Neg Hx    Pancreatic cancer Neg Hx     Current Medications:  Current Outpatient Medications:    acetaminophen  (TYLENOL ) 500 MG tablet, Take 2 tablets (1,000 mg total) by mouth every 12 (twelve) hours., Disp: 30 tablet, Rfl: 0   amLODipine  (NORVASC ) 10 MG tablet, Take 10 mg by mouth every evening. , Disp: , Rfl:    aspirin  EC 81 MG tablet, Take 81 mg by mouth daily., Disp: ,  Rfl:    chlorthalidone (HYGROTON) 25 MG tablet, Take 25 mg by mouth every morning., Disp: , Rfl:    cholecalciferol (VITAMIN D ) 25 MCG (1000 UNIT) tablet, Take 1,000 Units by mouth daily., Disp: , Rfl:    cyanocobalamin  (VITAMIN B12) 1000 MCG tablet, TAKE 1 TABLET BY MOUTH ONCE DAILY., Disp: 90 tablet, Rfl: 3   docusate sodium (COLACE) 100 MG capsule, Take 100 mg by mouth 2 (two) times daily., Disp: , Rfl:    folic acid  (FOLVITE ) 1 MG  tablet, Take 1 mg by mouth daily., Disp: , Rfl:    lisinopril  (ZESTRIL ) 20 MG tablet, Take 20 mg by mouth daily., Disp: , Rfl:    loratadine  (CLARITIN ) 10 MG tablet, Take 10 mg by mouth daily., Disp: , Rfl:    lovastatin  (MEVACOR ) 10 MG tablet, Take 10 mg by mouth at bedtime., Disp: , Rfl:    melatonin 5 MG TABS, Take 5 mg by mouth at bedtime., Disp: , Rfl:    metoprolol  tartrate (LOPRESSOR ) 25 MG tablet, Take 1 tablet (25 mg total) by mouth 2 (two) times daily., Disp: 60 tablet, Rfl: 6   mirtazapine  (REMERON ) 7.5 MG tablet, Take 7.5 mg by mouth at bedtime., Disp: , Rfl:    Multiple Vitamin (MULTIVITAMIN WITH MINERALS) TABS tablet, Take 1 tablet by mouth daily., Disp: , Rfl:    niraparib  tosylate (ZEJULA ) 200 MG tablet, Take 1 tablet (200 mg total) by mouth daily. May take at bedtime to reduce nausea and vomiting., Disp: 30 tablet, Rfl: 5   ondansetron  (ZOFRAN ) 8 MG tablet, Take 1 tablet (8 mg total) by mouth 2 (two) times daily as needed for refractory nausea / vomiting. Start on day 3 after chemo., Disp: 30 tablet, Rfl: 1   prochlorperazine  (COMPAZINE ) 10 MG tablet, Take 1 tablet (10 mg total) by mouth every 6 (six) hours as needed (Nausea or vomiting)., Disp: 60 tablet, Rfl: 3   QUEtiapine  (SEROQUEL  XR) 300 MG 24 hr tablet, Take 300 mg by mouth at bedtime., Disp: , Rfl:    sulfaSALAzine  (AZULFIDINE ) 500 MG tablet, Take 500 mg by mouth 3 (three) times daily., Disp: , Rfl:    traMADol  (ULTRAM ) 50 MG tablet, Take 1 tablet (50 mg total) by mouth every 12 (twelve) hours  as needed., Disp: 60 tablet, Rfl: 0   Allergies: No Known Allergies  REVIEW OF SYSTEMS:   Review of Systems  Constitutional:  Negative for chills, fatigue and fever.  HENT:   Negative for lump/mass, mouth sores, nosebleeds, sore throat and trouble swallowing.   Eyes:  Negative for eye problems.  Respiratory:  Negative for cough and shortness of breath.   Cardiovascular:  Negative for chest pain, leg swelling and palpitations.  Gastrointestinal:  Negative for abdominal pain, constipation, diarrhea, nausea and vomiting.  Genitourinary:  Negative for bladder incontinence, difficulty urinating, dysuria, frequency, hematuria and nocturia.   Musculoskeletal:  Negative for arthralgias, back pain, flank pain, myalgias and neck pain.  Skin:  Negative for itching and rash.  Neurological:  Negative for dizziness, headaches and numbness.  Hematological:  Does not bruise/bleed easily.  Psychiatric/Behavioral:  Negative for depression, sleep disturbance and suicidal ideas. The patient is not nervous/anxious.   All other systems reviewed and are negative.    VITALS:   Blood pressure (!) 141/86, pulse 77, temperature (!) 96.7 F (35.9 C), temperature source Tympanic, resp. rate 18, weight 149 lb 11.1 oz (67.9 kg), SpO2 99%.  Wt Readings from Last 3 Encounters:  01/17/24 149 lb 11.1 oz (67.9 kg)  10/18/23 150 lb 9.2 oz (68.3 kg)  09/14/23 152 lb 6.4 oz (69.1 kg)    Body mass index is 24.16 kg/m.  Performance status (ECOG): 1 - Symptomatic but completely ambulatory  PHYSICAL EXAM:   Physical Exam Vitals and nursing note reviewed. Exam conducted with a chaperone present.  Constitutional:      Appearance: Normal appearance.  Cardiovascular:     Rate and Rhythm: Normal rate and regular rhythm.     Pulses: Normal pulses.  Heart sounds: Normal heart sounds.  Pulmonary:     Effort: Pulmonary effort is normal.     Breath sounds: Normal breath sounds.  Abdominal:     Palpations: Abdomen  is soft. There is no hepatomegaly, splenomegaly or mass.     Tenderness: There is no abdominal tenderness.  Musculoskeletal:     Right lower leg: No edema.     Left lower leg: No edema.  Lymphadenopathy:     Cervical: No cervical adenopathy.     Right cervical: No superficial, deep or posterior cervical adenopathy.    Left cervical: No superficial, deep or posterior cervical adenopathy.     Upper Body:     Right upper body: No supraclavicular or axillary adenopathy.     Left upper body: No supraclavicular or axillary adenopathy.  Neurological:     General: No focal deficit present.     Mental Status: She is alert and oriented to person, place, and time.  Psychiatric:        Mood and Affect: Mood normal.        Behavior: Behavior normal.     LABS:      Latest Ref Rng & Units 01/10/2024   10:45 AM 10/11/2023   10:05 AM 09/09/2023   11:38 AM  CBC  WBC 4.0 - 10.5 K/uL 4.8  6.7  6.5   Hemoglobin 12.0 - 15.0 g/dL 9.7  09.8  9.2   Hematocrit 36.0 - 46.0 % 29.1  30.9  28.5   Platelets 150 - 400 K/uL 297  355  297       Latest Ref Rng & Units 01/10/2024   10:45 AM 10/11/2023   10:05 AM 09/09/2023   11:38 AM  CMP  Glucose 70 - 99 mg/dL 119  147  829   BUN 8 - 23 mg/dL 27  24  17    Creatinine 0.44 - 1.00 mg/dL 5.62  1.30  8.65   Sodium 135 - 145 mmol/L 138  137  138   Potassium 3.5 - 5.1 mmol/L 3.7  3.8  4.1   Chloride 98 - 111 mmol/L 102  100  104   CO2 22 - 32 mmol/L 26  27  27    Calcium 8.9 - 10.3 mg/dL 9.7  9.5  9.5   Total Protein 6.5 - 8.1 g/dL 7.8  7.6  7.0   Total Bilirubin 0.0 - 1.2 mg/dL 0.6  0.6  0.6   Alkaline Phos 38 - 126 U/L 77  82  72   AST 15 - 41 U/L 34  31  34   ALT 0 - 44 U/L 29  24  27       Lab Results  Component Value Date   CEA 0.5 04/26/2015   /  CEA  Date Value Ref Range Status  04/26/2015 0.5 0.0 - 4.7 ng/mL Final    Comment:    (NOTE)       Roche ECLIA methodology       Nonsmokers  <3.9                                     Smokers      <5.6 Performed At: [facility] [address] [place], [place] [id number] Juel Nutley MD [id number]    No results found for: "PSA1" No results found for: "VOZ366" Lab Results  Component Value Date   CAN125 7.3 01/10/2024  Lab Results  Component Value Date   TOTALPROTELP 7.1 08/26/2015   ALBUMINELP 4.0 08/26/2015   A1GS 0.2 08/26/2015   A2GS 0.7 08/26/2015   BETS 1.0 08/26/2015   GAMS 1.2 08/26/2015   MSPIKE Not Observed 08/26/2015   SPEI Comment 08/26/2015   Lab Results  Component Value Date   TIBC 272 01/10/2024   TIBC 275 10/11/2023   TIBC 252 03/06/2023   FERRITIN 784 (H) 01/10/2024   FERRITIN 877 (H) 10/11/2023   FERRITIN 827 (H) 03/06/2023   IRONPCTSAT 40 (H) 01/10/2024   IRONPCTSAT 28 10/11/2023   IRONPCTSAT 30 03/06/2023   Lab Results  Component Value Date   LDH 219 (H) 03/24/2020   LDH 198 (H) 11/15/2018   LDH 185 08/13/2018     STUDIES:   No results found.

## 2024-01-17 NOTE — Patient Instructions (Addendum)
 [facility] at [facility] Discharge Instructions   You were seen and examined today by Dr. Cheree Cords.  He reviewed the results of your lab work which are mostly normal/stable. Your hemoglobin is low and your creatinine (kidney function number) is elevated. We will refer you to kidney specialist to address this.   Dr. Linnell Richardson is going to cut back on your dose of Zejula  pills. Stop the pills you are taking now and restart once you get the lower dose pills.   We will see you back in .   Return as scheduled.    Thank you for choosing [facility] at [facility] to provide your oncology and hematology care.  To afford each patient quality time with our provider, please arrive at least 15 minutes before your scheduled appointment time.   If you have a lab appointment with the Cancer Center please come in thru the Main Entrance and check in at the main information desk.  You need to re-schedule your appointment should you arrive 10 or more minutes late.  We strive to give you quality time with our providers, and arriving late affects you and other patients whose appointments are after yours.  Also, if you no show three or more times for appointments you may be dismissed from the clinic at the providers discretion.     Again, thank you for choosing [facility].  Our hope is that these requests will decrease the amount of time that you wait before being seen by our physicians.       _____________________________________________________________  Should you have questions after your visit to [facility], please contact our office at [phone] and follow the prompts.  Our office hours are 8:00 a.m. and 4:30 p.m. Monday - Friday.  Please note that voicemails left after 4:00 p.m. may not be returned until the following business day.  We are closed weekends and major holidays.  You do have access to a nurse 24-7, just call the main number  to the clinic [phone] and do not press any options, hold on the line and a nurse will answer the phone.    For prescription refill requests, have your pharmacy contact our office and allow 72 hours.    Due to Covid, you will need to wear a mask upon entering the hospital. If you do not have a mask, a mask will be given to you at the Main Entrance upon arrival. For doctor visits, patients may have 1 support person age 44 or older with them. For treatment visits, patients can not have anyone with them due to social distancing guidelines and our immunocompromised population.

## 2024-01-17 NOTE — Addendum Note (Signed)
 Addended by: Alen Amy on: 01/17/2024 11:37 AM   Modules accepted: Orders

## 2024-01-22 ENCOUNTER — Other Ambulatory Visit: Payer: Self-pay

## 2024-01-31 ENCOUNTER — Telehealth: Payer: Self-pay

## 2024-01-31 NOTE — Telephone Encounter (Signed)
 Patient does not have voicemail setup no answer

## 2024-02-06 ENCOUNTER — Other Ambulatory Visit: Payer: Self-pay | Admitting: Pharmacy Technician

## 2024-02-06 ENCOUNTER — Other Ambulatory Visit: Payer: Self-pay

## 2024-02-06 NOTE — Progress Notes (Signed)
 Specialty Pharmacy Refill Coordination Note  Latasha [name] is a 83 y.o. female contacted today regarding refills of specialty medication(s) Niraparib  Tosylate (ZEJULA )   Patient requested Delivery   Delivery date: 02/20/24   Verified address: [address] [place], [place] [address]   Medication will be filled on 02/19/24.

## 2024-02-19 ENCOUNTER — Other Ambulatory Visit: Payer: Self-pay

## 2024-03-07 ENCOUNTER — Other Ambulatory Visit: Payer: Self-pay | Admitting: Hematology

## 2024-03-11 ENCOUNTER — Other Ambulatory Visit (HOSPITAL_COMMUNITY): Payer: Self-pay

## 2024-03-11 ENCOUNTER — Other Ambulatory Visit: Payer: Self-pay

## 2024-03-11 NOTE — Progress Notes (Signed)
 Specialty Pharmacy Refill Coordination Note  Spoke with Stacia Beck (Self).   Donalda Job is a 83 y.o. female contacted today regarding refills of specialty medication(s) Niraparib  Tosylate (ZEJULA )  Patient requested: Delivery   Delivery date: 03/14/24   Verified address: [address]   [place] [address]  Medication will be filled on 03/13/24.

## 2024-03-12 ENCOUNTER — Other Ambulatory Visit (HOSPITAL_COMMUNITY): Payer: Self-pay | Admitting: Nephrology

## 2024-03-12 DIAGNOSIS — D638 Anemia in other chronic diseases classified elsewhere: Secondary | ICD-10-CM

## 2024-03-12 DIAGNOSIS — N1832 Chronic kidney disease, stage 3b: Secondary | ICD-10-CM

## 2024-03-13 ENCOUNTER — Other Ambulatory Visit: Payer: Self-pay

## 2024-03-13 ENCOUNTER — Other Ambulatory Visit (HOSPITAL_COMMUNITY): Payer: Self-pay

## 2024-03-19 ENCOUNTER — Ambulatory Visit (HOSPITAL_COMMUNITY)
Admission: RE | Admit: 2024-03-19 | Discharge: 2024-03-19 | Disposition: A | Discharge status: Home / Self Care | Source: Ambulatory Visit | Attending: Nephrology | Admitting: Nephrology

## 2024-03-19 DIAGNOSIS — D638 Anemia in other chronic diseases classified elsewhere: Secondary | ICD-10-CM | POA: Diagnosis present

## 2024-03-19 DIAGNOSIS — N1832 Chronic kidney disease, stage 3b: Secondary | ICD-10-CM | POA: Insufficient documentation

## 2024-03-26 ENCOUNTER — Other Ambulatory Visit: Payer: Self-pay | Admitting: *Deleted

## 2024-03-26 DIAGNOSIS — C562 Malignant neoplasm of left ovary: Secondary | ICD-10-CM

## 2024-03-26 MED ORDER — TRAMADOL HCL 50 MG PO TABS: 50.0000 mg | ORAL_TABLET | Freq: Two times a day (BID) | ORAL | 0 refills | Status: DC | PRN

## 2024-04-10 ENCOUNTER — Inpatient Hospital Stay: Attending: Hematology

## 2024-04-10 DIAGNOSIS — N183 Chronic kidney disease, stage 3 unspecified: Secondary | ICD-10-CM | POA: Insufficient documentation

## 2024-04-10 DIAGNOSIS — I129 Hypertensive chronic kidney disease with stage 1 through stage 4 chronic kidney disease, or unspecified chronic kidney disease: Secondary | ICD-10-CM | POA: Diagnosis not present

## 2024-04-10 DIAGNOSIS — R7989 Other specified abnormal findings of blood chemistry: Secondary | ICD-10-CM | POA: Diagnosis not present

## 2024-04-10 DIAGNOSIS — C562 Malignant neoplasm of left ovary: Secondary | ICD-10-CM

## 2024-04-10 DIAGNOSIS — C563 Malignant neoplasm of bilateral ovaries: Secondary | ICD-10-CM | POA: Insufficient documentation

## 2024-04-10 DIAGNOSIS — Z9221 Personal history of antineoplastic chemotherapy: Secondary | ICD-10-CM | POA: Diagnosis not present

## 2024-04-10 DIAGNOSIS — C786 Secondary malignant neoplasm of retroperitoneum and peritoneum: Secondary | ICD-10-CM | POA: Diagnosis present

## 2024-04-10 DIAGNOSIS — Z79899 Other long term (current) drug therapy: Secondary | ICD-10-CM | POA: Diagnosis not present

## 2024-04-10 LAB — CBC WITH DIFFERENTIAL/PLATELET
Abs Immature Granulocytes: 0.02 K/uL (ref 0.00–0.07)
Basophils Absolute: 0.1 K/uL (ref 0.0–0.1)
Basophils Relative: 1 %
Eosinophils Absolute: 0.3 K/uL (ref 0.0–0.5)
Eosinophils Relative: 5 %
HCT: 31.1 % — ABNORMAL LOW (ref 36.0–46.0)
Hemoglobin: 10 g/dL — ABNORMAL LOW (ref 12.0–15.0)
Immature Granulocytes: 0 %
Lymphocytes Relative: 20 %
Lymphs Abs: 1.2 K/uL (ref 0.7–4.0)
MCH: 35.2 pg — ABNORMAL HIGH (ref 26.0–34.0)
MCHC: 32.2 g/dL (ref 30.0–36.0)
MCV: 109.5 fL — ABNORMAL HIGH (ref 80.0–100.0)
Monocytes Absolute: 0.9 K/uL (ref 0.1–1.0)
Monocytes Relative: 15 %
Neutro Abs: 3.5 K/uL (ref 1.7–7.7)
Neutrophils Relative %: 59 %
Platelets: 286 K/uL (ref 150–400)
RBC: 2.84 MIL/uL — ABNORMAL LOW (ref 3.87–5.11)
RDW: 12.8 % (ref 11.5–15.5)
WBC: 5.9 K/uL (ref 4.0–10.5)
nRBC: 0 % (ref 0.0–0.2)

## 2024-04-10 LAB — COMPREHENSIVE METABOLIC PANEL WITH GFR
ALT: 32 U/L (ref 0–44)
AST: 37 U/L (ref 15–41)
Albumin: 4.2 g/dL (ref 3.5–5.0)
Alkaline Phosphatase: 79 U/L (ref 38–126)
Anion gap: 9 (ref 5–15)
BUN: 21 mg/dL (ref 8–23)
CO2: 27 mmol/L (ref 22–32)
Calcium: 9.9 mg/dL (ref 8.9–10.3)
Chloride: 103 mmol/L (ref 98–111)
Creatinine, Ser: 1.53 mg/dL — ABNORMAL HIGH (ref 0.44–1.00)
GFR, Estimated: 34 mL/min — ABNORMAL LOW (ref >60)
Glucose, Bld: 100 mg/dL — ABNORMAL HIGH (ref 70–99)
Potassium: 4.3 mmol/L (ref 3.5–5.1)
Sodium: 139 mmol/L (ref 135–145)
Total Bilirubin: 0.4 mg/dL (ref 0.0–1.2)
Total Protein: 7.6 g/dL (ref 6.5–8.1)

## 2024-04-11 ENCOUNTER — Other Ambulatory Visit: Payer: Self-pay

## 2024-04-11 ENCOUNTER — Other Ambulatory Visit: Payer: Self-pay | Admitting: Pharmacy Technician

## 2024-04-11 LAB — CA 125: Cancer Antigen (CA) 125: 7.4 U/mL (ref 0.0–38.1)

## 2024-04-11 NOTE — Progress Notes (Signed)
 Specialty Pharmacy Refill Coordination Note  Latasha [name] is a 83 y.o. female contacted today regarding refills of specialty medication(s) Niraparib  Tosylate (ZEJULA )   Patient requested Delivery   Delivery date: 04/16/24   Verified address: [address]  [place] [address]   Medication will be filled on 04/12/24.   Medication takes 2 days for patient to receive.

## 2024-04-12 ENCOUNTER — Other Ambulatory Visit: Payer: Self-pay

## 2024-04-17 ENCOUNTER — Inpatient Hospital Stay (HOSPITAL_BASED_OUTPATIENT_CLINIC_OR_DEPARTMENT_OTHER): Admitting: Hematology

## 2024-04-17 ENCOUNTER — Other Ambulatory Visit: Payer: Self-pay

## 2024-04-17 VITALS — BP 152/73 | HR 83 | Temp 98.0°F | Resp 16 | Wt 151.7 lb

## 2024-04-17 DIAGNOSIS — C562 Malignant neoplasm of left ovary: Secondary | ICD-10-CM

## 2024-04-17 DIAGNOSIS — C563 Malignant neoplasm of bilateral ovaries: Secondary | ICD-10-CM | POA: Diagnosis not present

## 2024-04-17 NOTE — Progress Notes (Signed)
 [facility] [address] [place] [address]    Clinic Day:  04/17/2024  Referring physician: Katrinka Aquas, MD  Patient Care Team: Katrinka Aquas, MD as PCP - General (Internal Medicine)   ASSESSMENT & PLAN:   Assessment: 1.  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 -3 cycles of carboplatin  and paclitaxel  followed by debulking surgery by Dr. Eloy on 08/07/2015 followed by 3 more cycles completed on 10/07/2015. -6 cycles of carboplatin  and paclitaxel  from 08/17/2017 through 11/30/2017 in the second line setting. -Germline mutation testing was negative. -Maintenance Niraparib  300 mg daily started in April 2019, dose decreased to 200 mg daily on 01/17/2024.  She was recommended to continue therapy indefinitely until progression by Dr. Eloy. - She also had discussion with Dr. Viktoria regarding continuation of niraparib  and decided to continue it until progression.    Plan: 1.  Stage IVb ovarian cancer: - At last visit on 01/17/2024, have decreased niraparib  dose to 200 mg based on her weight.  She is tolerating it well. - I reviewed her labs from 04/10/2024: Creatinine improved to 1.53 with normal LFTs.  CBC grossly normal.  Hemoglobin is 10.0 with macrocytosis, from niraparib .  Will closely monitor as there is slight risk of MDS. - CA125 is stable at 7.4. - I have recommended continuing niraparib  200 mg daily until progression.  RTC 6 months for follow-up with tumor markers, labs.  Will also repeat CTAP with contrast.  Will continue monitoring with imaging once a year or sooner if symptoms/increased tumor marker.   2.  Elevated creatinine: - Her creatinine has improved to 1.53.  Continue follow-up with Dr. Waldemar.   3.  Back pain and joint pains: - Continue tramadol  50 mg daily as needed.   4.  Hypertension: - Continue amlodipine , chlorthalidone and metoprolol .  Lisinopril  on hold.    Orders Placed This Encounter   Procedures   CT ABDOMEN PELVIS W CONTRAST    Standing Status:   Future    Expected Date:   10/18/2024    Expiration Date:   04/17/2025    If indicated for the ordered procedure, I authorize the administration of contrast media per Radiology protocol:   Yes    Does the patient have a contrast media/X-ray dye allergy?:   No    Preferred imaging location?:   [facility]    If indicated for the ordered procedure, I authorize the administration of oral contrast media per Radiology protocol:   Yes   CBC with Differential    Standing Status:   Future    Expected Date:   10/14/2024    Expiration Date:   01/12/2025   Comprehensive metabolic panel    Standing Status:   Future    Expected Date:   10/14/2024    Expiration Date:   01/12/2025   CA 125    Standing Status:   Future    Expected Date:   10/14/2024    Expiration Date:   01/12/2025     LILLETTE Hummingbird R Teague,acting as a scribe for Alean Stands, MD.,have documented all relevant documentation on the behalf of Alean Stands, MD,as directed by  Alean Stands, MD while in the presence of Alean Stands, MD.  I, Alean Stands MD, have reviewed the above documentation for accuracy and completeness, and I agree with the above.     Alean Stands, MD   7/30/202512:35 PM  CHIEF COMPLAINT:   Diagnosis: left ovarian cancer  Cancer Staging  Ovarian cancer [facility]) Staging form: Ovary, AJCC 7th Edition - Clinical stage from 04/27/2015: FIGO Stage IVB, calculated as Stage IV (T3c, N1, M1) - Signed by Berry Debby RAMAN, PA-C on 09/16/2015 - Pathologic stage from 05/08/2015: FIGO Stage IVB, calculated as Stage IV (TX, N1, M1) - Signed by Eloy Herring, MD on 05/08/2015 - Pathologic stage from 07/28/2015: FIGO Stage IV (T3c, NX, M1) - Signed by Berry Debby RAMAN, PA-C on 09/16/2015    Prior Therapy: 1. Carboplatin  and paclitaxel  x 3 cycles, 05/19/15 - 07/01/15 2. Debulking surgery with TAH-BSO on 08/07/2015. 3.  Carboplatin  and paclitaxel -- 3 cycles, 08/26/15 - 10/07/15; 6 cycles, 08/17/17 - 11/30/17.  Current Therapy:  Zejula  300 mg daily    HISTORY OF PRESENT ILLNESS:   Oncology History  Ovarian cancer (HCC)  04/25/2015 - 04/28/2015 Hospital Admission   Nausea/diarrhea.  Oncology consult completed on 04/27/2015.   04/25/2015 Tumor Marker   CA 125- 3902.      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   04/27/2015 Procedure   US  Paracentesis- A total of approximately 3700 mL of amber colored fluid was removed. A fluid sample was sent for laboratory analysis.   04/27/2015 Imaging   CT Chest- Mild left supraclavicular lymphadenopathy and moderate mediastinal lymphadenopathy involving the prevascular, subcarinal and bilateral pericardiophrenic nodal chains, likely metastatic.   04/27/2015 Pathology Results   Diagnosis PERITONEAL/ASCITIC FLUID(SPECIMEN 1 OF 1 COLLECTED 04/27/15): MALIGNANT CELLS CONSISTENT WITH METASTATIC ADENOCARCINOMA.  The immunophenotype is most consistent with a gynecologic primary, most likely ovary.   05/08/2015 Miscellaneous   Seen by Dr. Eloy- recommending Carboplatin /Paclitaxel  x 3 cycles and return visit to see her (within 1 week of administration of third cycle) to evaluate for optimal sequencing of treatment modalities.   05/13/2015 - 05/15/2015 Hospital Admission   Hospitalized for AKI   05/19/2015 - 07/01/2015 Chemotherapy   Carboplatin /Paclitaxel  x 3 cycles   07/28/2015 Surgery   Exploratory laparotomy with total abdominal hysterectomy, bilateral salpingo-oophorectomy, omentectomy radical tumor debulking for ovarian cancer; by Dr. Herring Eloy.   07/28/2015 Pathology Results   Uterus +/- tubes/ovaries, neoplastic, cervix - HIGH GRADE SEROUS CARCINOMA INVOLVING LEFT OVARY, LEFT FALLOPIAN TUBE AND RIGHT FALLOPIAN TUBE. - CERVIX,  ENDOMETRIUM AND MYOMETRIUM ARE FREE OF TUMOR. 2. Cul-de-sac biop...   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  The Breast/Ovarian gene panel offered by [facility] includes sequencing and rearrangement analysis for the following 20 genes:  ATM, BARD1, BRCA1, BRCA2, BRIP1, CDH1, CHEK2, EPCAM, FANCC, MLH1, MSH2, MSH6, NBN, PALB2, PMS2, PTEN, RAD51C, RAD51D, TP53, and XRCC2.   The report date is 12/14/2015.  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  The updated report date is October 29, 2019.   12/23/2015 Miscellaneous   Genetic counseling.  Genetic testing was normal, and did not reveal a deleterious mutation in these genes   03/07/2017 Imaging   CT C/A/P IMPRESSION: Chest Impression:   No evidence of metastatic disease in thorax.   Abdomen / Pelvis Impression:   1. Interval increase in size of solitary nodular peritoneal implant in the LEFT upper quadrant .  2. No additional evidence of peritoneal nodularity. 3. No ascites.   07/31/2017 Progression   CT C/A/P: IMPRESSION: 1. Left upper quadrant nodule continues to progress, now measuring 20 x 26 mm. 2. New nodule identified between the in adrenal gland, concerning for metastatic deposit. 3. Interval development of tiny nodules in the left lower quadrant mesenteric, concerning for metastases. 4. No ascites. 5. Status post total abdominal hysterectomy and omentectomy.      INTERVAL HISTORY:   Norleen is a 83 y.o. female seen for follow-up of left ovarian cancer.  She was last seen by me on 01/17/2024.  Today, she states that she is doing well overall. Her appetite level is at 75%. Her energy level is at 75%.   PAST MEDICAL HISTORY:    Past Medical History: Past Medical History:  Diagnosis Date   Arthritis    B12 deficiency    Cataract    History of blood transfusion    History of chemotherapy    Hypertension    Iron deficiency anemia    Mixed hyperlipidemia    Ovarian cancer (HCC)    Regional enteritis (HCC)    Schizophrenia (HCC)    Ulcerative colitis (HCC)    Vitamin D  deficiency     Surgical History: Past Surgical History:  Procedure Laterality Date   ABDOMINAL HYSTERECTOMY N/A 07/28/2015   Procedure: TOTAL HYSTERECTOMY ABDOMINAL BILATERAL SALPINGO OOPHRORECTOMY;  Surgeon: Maurilio Ship, MD;  Location: [facility];  Service: Gynecology;  Laterality: N/A;   DEBULKING N/A 07/28/2015   Procedure: DEBULKING;  Surgeon: Maurilio Ship, MD;  Location: [facility];  Service: Gynecology;  Laterality: N/A;   IR REMOVAL TUN ACCESS W/ PORT W/O FL MOD SED  01/25/2023   LAPAROTOMY N/A 07/28/2015   Procedure: EXPLORATORY LAPAROTOMY;  Surgeon: Maurilio Ship, MD;  Location: [facility];  Service: Gynecology;  Laterality: N/A;   OMENTECTOMY N/A 07/28/2015   Procedure: OMENTECTOMY ;  Surgeon: Maurilio Ship, MD;  Location: [facility];  Service: Gynecology;  Laterality: N/A;   PARACENTESIS  04/27/15   PORTACATH PLACEMENT Right 04/2015   REPLACEMENT TOTAL KNEE     right knee in 2003    Social History: Social History   Socioeconomic History   Marital status: Single    Spouse name: Not on file   Number of children: 1   Years of education: Not on file   Highest education level: Not on file  Occupational History   Not on file  Tobacco Use   Smoking status: Never   Smokeless tobacco: Never  Vaping Use   Vaping status: Never Used  Substance and Sexual Activity   Alcohol use: No   Drug use: No   Sexual activity: Not Currently  Other Topics Concern   Not on file  Social History Narrative   Not on file   Social Drivers of Health   Financial Resource Strain: Not on file  Food Insecurity: Not on file  Transportation Needs: Not on file  Physical  Activity: Not on file  Stress: Not on file  Social Connections: Not on file  Intimate Partner Violence: Not on file    Family History: Family History  Problem Relation Age of Onset   Congestive Heart Failure Mother    Seizures Sister    Prostate cancer Brother    Cancer Paternal Uncle        1 uncle with cancer NOS   Lung cancer Maternal Grandmother    Hypertension Maternal Grandfather    Colon cancer Neg Hx  Breast cancer Neg Hx    Ovarian cancer Neg Hx    Endometrial cancer Neg Hx    Pancreatic cancer Neg Hx     Current Medications:  Current Outpatient Medications:    acetaminophen  (TYLENOL ) 500 MG tablet, Take 2 tablets (1,000 mg total) by mouth every 12 (twelve) hours., Disp: 30 tablet, Rfl: 0   amLODipine  (NORVASC ) 10 MG tablet, Take 10 mg by mouth every evening. , Disp: , Rfl:    aspirin  EC 81 MG tablet, Take 81 mg by mouth daily., Disp: , Rfl:    chlorthalidone (HYGROTON) 25 MG tablet, Take 25 mg by mouth every morning., Disp: , Rfl:    cholecalciferol (VITAMIN D ) 25 MCG (1000 UNIT) tablet, Take 1,000 Units by mouth daily., Disp: , Rfl:    cyanocobalamin  (VITAMIN B12) 1000 MCG tablet, TAKE ONE TABLET BY MOUTH EVERY DAY, Disp: 90 tablet, Rfl: 3   docusate sodium (COLACE) 100 MG capsule, Take 100 mg by mouth 2 (two) times daily., Disp: , Rfl:    folic acid  (FOLVITE ) 1 MG tablet, Take 1 mg by mouth daily., Disp: , Rfl:    lisinopril  (ZESTRIL ) 20 MG tablet, Take 20 mg by mouth daily., Disp: , Rfl:    loratadine  (CLARITIN ) 10 MG tablet, Take 10 mg by mouth daily., Disp: , Rfl:    lovastatin  (MEVACOR ) 10 MG tablet, Take 10 mg by mouth at bedtime., Disp: , Rfl:    melatonin 5 MG TABS, Take 5 mg by mouth at bedtime., Disp: , Rfl:    metoprolol  tartrate (LOPRESSOR ) 25 MG tablet, Take 1 tablet (25 mg total) by mouth 2 (two) times daily., Disp: 60 tablet, Rfl: 6   mirtazapine  (REMERON ) 7.5 MG tablet, Take 7.5 mg by mouth at bedtime., Disp: , Rfl:    Multiple Vitamin  (MULTIVITAMIN WITH MINERALS) TABS tablet, Take 1 tablet by mouth daily., Disp: , Rfl:    niraparib  tosylate (ZEJULA ) 200 MG tablet, Take 1 tablet (200 mg total) by mouth daily. May take at bedtime to reduce nausea and vomiting., Disp: 30 tablet, Rfl: 5   ondansetron  (ZOFRAN ) 8 MG tablet, Take 1 tablet (8 mg total) by mouth 2 (two) times daily as needed for refractory nausea / vomiting. Start on day 3 after chemo., Disp: 30 tablet, Rfl: 1   prochlorperazine  (COMPAZINE ) 10 MG tablet, Take 1 tablet (10 mg total) by mouth every 6 (six) hours as needed (Nausea or vomiting)., Disp: 60 tablet, Rfl: 3   QUEtiapine  (SEROQUEL  XR) 300 MG 24 hr tablet, Take 300 mg by mouth at bedtime., Disp: , Rfl:    sulfaSALAzine  (AZULFIDINE ) 500 MG tablet, Take 500 mg by mouth 3 (three) times daily., Disp: , Rfl:    traMADol  (ULTRAM ) 50 MG tablet, Take 1 tablet (50 mg total) by mouth every 12 (twelve) hours as needed., Disp: 60 tablet, Rfl: 0   Allergies: No Known Allergies  REVIEW OF SYSTEMS:   Review of Systems  Constitutional:  Negative for chills, fatigue and fever.  HENT:   Negative for lump/mass, mouth sores, nosebleeds, sore throat and trouble swallowing.   Eyes:  Negative for eye problems.  Respiratory:  Negative for cough and shortness of breath.   Cardiovascular:  Negative for chest pain, leg swelling and palpitations.  Gastrointestinal:  Negative for abdominal pain, constipation, diarrhea, nausea and vomiting.  Genitourinary:  Negative for bladder incontinence, difficulty urinating, dysuria, frequency, hematuria and nocturia.   Musculoskeletal:  Negative for arthralgias, back pain, flank pain, myalgias and neck pain.  Skin:  Negative for itching and rash.  Neurological:  Negative for dizziness, headaches and numbness.  Hematological:  Does not bruise/bleed easily.  Psychiatric/Behavioral:  Negative for depression, sleep disturbance and suicidal ideas. The patient is not nervous/anxious.   All other  systems reviewed and are negative.    VITALS:   Blood pressure (!) 152/73, pulse 83, temperature 98 F (36.7 C), temperature source Oral, resp. rate 16, weight 151 lb 10.8 oz (68.8 kg), SpO2 100%.  Wt Readings from Last 3 Encounters:  04/17/24 151 lb 10.8 oz (68.8 kg)  01/17/24 149 lb 11.1 oz (67.9 kg)  10/18/23 150 lb 9.2 oz (68.3 kg)    Body mass index is 24.48 kg/m.  Performance status (ECOG): 1 - Symptomatic but completely ambulatory  PHYSICAL EXAM:   Physical Exam Vitals and nursing note reviewed. Exam conducted with a chaperone present.  Constitutional:      Appearance: Normal appearance.  Cardiovascular:     Rate and Rhythm: Normal rate and regular rhythm.     Pulses: Normal pulses.     Heart sounds: Normal heart sounds.  Pulmonary:     Effort: Pulmonary effort is normal.     Breath sounds: Normal breath sounds.  Abdominal:     Palpations: Abdomen is soft. There is no hepatomegaly, splenomegaly or mass.     Tenderness: There is no abdominal tenderness.  Musculoskeletal:     Right lower leg: No edema.     Left lower leg: No edema.  Lymphadenopathy:     Cervical: No cervical adenopathy.     Right cervical: No superficial, deep or posterior cervical adenopathy.    Left cervical: No superficial, deep or posterior cervical adenopathy.     Upper Body:     Right upper body: No supraclavicular or axillary adenopathy.     Left upper body: No supraclavicular or axillary adenopathy.  Neurological:     General: No focal deficit present.     Mental Status: She is alert and oriented to person, place, and time.  Psychiatric:        Mood and Affect: Mood normal.        Behavior: Behavior normal.     LABS:      Latest Ref Rng & Units 04/10/2024   10:50 AM 01/10/2024   10:45 AM 10/11/2023   10:05 AM  CBC  WBC 4.0 - 10.5 K/uL 5.9  4.8  6.7   Hemoglobin 12.0 - 15.0 g/dL 89.9  9.7  89.8   Hematocrit 36.0 - 46.0 % 31.1  29.1  30.9   Platelets 150 - 400 K/uL 286  297  355        Latest Ref Rng & Units 04/10/2024   10:50 AM 01/10/2024   10:45 AM 10/11/2023   10:05 AM  CMP  Glucose 70 - 99 mg/dL 899  897  896   BUN 8 - 23 mg/dL 21  27  24    Creatinine 0.44 - 1.00 mg/dL 8.46  7.89  8.60   Sodium 135 - 145 mmol/L 139  138  137   Potassium 3.5 - 5.1 mmol/L 4.3  3.7  3.8   Chloride 98 - 111 mmol/L 103  102  100   CO2 22 - 32 mmol/L 27  26  27    Calcium 8.9 - 10.3 mg/dL 9.9  9.7  9.5   Total Protein 6.5 - 8.1 g/dL 7.6  7.8  7.6   Total Bilirubin 0.0 - 1.2 mg/dL 0.4  0.6  0.6   Alkaline Phos 38 - 126 U/L 79  77  82   AST 15 - 41 U/L 37  34  31   ALT 0 - 44 U/L 32  29  24      Lab Results  Component Value Date   CEA 0.5 04/26/2015   /  CEA  Date Value Ref Range Status  04/26/2015 0.5 0.0 - 4.7 ng/mL Final    Comment:    (NOTE)       Roche ECLIA methodology       Nonsmokers  <3.9                                     Smokers     <5.6 Performed At: [facility] [address] [place], [place] [id number] Garwin Elsie FALCON MD Ey:[phone]    No results found for: PSA1 No results found for: CAN199 Lab Results  Component Value Date   CAN125 7.4 04/10/2024    Lab Results  Component Value Date   TOTALPROTELP 7.1 08/26/2015   ALBUMINELP 4.0 08/26/2015   A1GS 0.2 08/26/2015   A2GS 0.7 08/26/2015   BETS 1.0 08/26/2015   GAMS 1.2 08/26/2015   MSPIKE Not Observed 08/26/2015   SPEI Comment 08/26/2015   Lab Results  Component Value Date   TIBC 272 01/10/2024   TIBC 275 10/11/2023   TIBC 252 03/06/2023   FERRITIN 784 (H) 01/10/2024   FERRITIN 877 (H) 10/11/2023   FERRITIN 827 (H) 03/06/2023   IRONPCTSAT 40 (H) 01/10/2024   IRONPCTSAT 28 10/11/2023   IRONPCTSAT 30 03/06/2023   Lab Results  Component Value Date   LDH 219 (H) 03/24/2020   LDH 198 (H) 11/15/2018   LDH 185 08/13/2018     STUDIES:   US  RENAL Result Date: 03/22/2024 CLINICAL DATA:  Chronic kidney disease 3 ane IIIb EXAM: RENAL / URINARY TRACT ULTRASOUND COMPLETE  COMPARISON:  In a patient with history of ovarian carcinoma. FINDINGS: Right Kidney: Renal measurements: 8.0 cm x 3.4 cm x 4.7 cm = volume: 69 mL. Echogenicity within normal limits. No mass or hydronephrosis visualized. Left Kidney: Renal measurements: 9.7 cm x 4.4 cm x 4.2 cm = volume: 95.6 mL. Echogenicity within normal limits. No mass or hydronephrosis visualized. Bladder: Appears normal for degree of bladder distention. Other: Prevoid volume 195.1 mL and postvoid volume 21.2 mL. IMPRESSION: No hydronephrosis or renal mass. Electronically Signed   By: Cordella Banner   On: 03/22/2024 12:22

## 2024-04-17 NOTE — Patient Instructions (Signed)
 [facility] at [facility] Discharge Instructions   You were seen and examined today by Dr. Rogers.  He reviewed the results of your lab work which are normal/stable.   Continue Zejula  as prescribed.   We will see you back in 6 months. We will repeat lab work and a CT scan prior to this visit.    Return as scheduled.    Thank you for choosing [facility] at [facility] to provide your oncology and hematology care.  To afford each patient quality time with our provider, please arrive at least 15 minutes before your scheduled appointment time.   If you have a lab appointment with the Cancer Center please come in thru the Main Entrance and check in at the main information desk.  You need to re-schedule your appointment should you arrive 10 or more minutes late.  We strive to give you quality time with our providers, and arriving late affects you and other patients whose appointments are after yours.  Also, if you no show three or more times for appointments you may be dismissed from the clinic at the providers discretion.     Again, thank you for choosing [facility].  Our hope is that these requests will decrease the amount of time that you wait before being seen by our physicians.       _____________________________________________________________  Should you have questions after your visit to [facility], please contact our office at [phone] and follow the prompts.  Our office hours are 8:00 a.m. and 4:30 p.m. Monday - Friday.  Please note that voicemails left after 4:00 p.m. may not be returned until the following business day.  We are closed weekends and major holidays.  You do have access to a nurse 24-7, just call the main number to the clinic [phone] and do not press any options, hold on the line and a nurse will answer the phone.    For prescription refill requests, have your pharmacy contact our  office and allow 72 hours.    Due to Covid, you will need to wear a mask upon entering the hospital. If you do not have a mask, a mask will be given to you at the Main Entrance upon arrival. For doctor visits, patients may have 1 support person age 69 or older with them. For treatment visits, patients can not have anyone with them due to social distancing guidelines and our immunocompromised population.

## 2024-04-17 NOTE — Progress Notes (Signed)
 Patient is taking Zejula as prescribed.  She has not missed any doses and reports no side effects at this time.

## 2024-05-03 ENCOUNTER — Other Ambulatory Visit: Payer: Self-pay

## 2024-05-06 ENCOUNTER — Other Ambulatory Visit: Payer: Self-pay

## 2024-05-08 ENCOUNTER — Other Ambulatory Visit (HOSPITAL_COMMUNITY): Payer: Self-pay

## 2024-05-08 ENCOUNTER — Other Ambulatory Visit: Payer: Self-pay

## 2024-05-08 NOTE — Progress Notes (Signed)
 Specialty Pharmacy Refill Coordination Note  Spoke with Latasha [name] is a 83 y.o. female contacted today regarding refills of specialty medication(s) Niraparib  Tosylate (ZEJULA )  Doses on hand: 15  Patient requested: Delivery   Delivery date: 05/17/24   Verified address: [address] [place] [address]  Medication will be filled on 05/16/24. [employer]

## 2024-06-07 ENCOUNTER — Other Ambulatory Visit (HOSPITAL_COMMUNITY): Payer: Self-pay

## 2024-06-11 ENCOUNTER — Other Ambulatory Visit: Payer: Self-pay

## 2024-06-11 ENCOUNTER — Other Ambulatory Visit: Payer: Self-pay | Admitting: Pharmacy Technician

## 2024-06-11 NOTE — Progress Notes (Signed)
 Specialty Pharmacy Refill Coordination Note  Latasha [name] is a 83 y.o. female contacted today regarding refills of specialty medication(s) Niraparib  Tosylate (ZEJULA )   Patient requested Delivery   Delivery date: 06/18/24   Verified address: [address]  [place] [id number] [employer]  Medication will be filled on 06/17/24.

## 2024-06-17 ENCOUNTER — Other Ambulatory Visit: Payer: Self-pay

## 2024-07-08 ENCOUNTER — Other Ambulatory Visit: Payer: Self-pay | Admitting: Hematology

## 2024-07-08 ENCOUNTER — Other Ambulatory Visit: Payer: Self-pay

## 2024-07-08 DIAGNOSIS — C562 Malignant neoplasm of left ovary: Secondary | ICD-10-CM

## 2024-07-08 NOTE — Progress Notes (Signed)
 Specialty Pharmacy Refill Coordination Note  Latasha [name] is a 83 y.o. female contacted today regarding refills of specialty medication(s) Niraparib  Tosylate (ZEJULA )   Patient requested Delivery   Delivery date: 07/17/24   Verified address: [address]  [place] [phone]   Medication will be filled on 07/15/24.   This fill date is pending response to refill request from provider. Patient is aware and if they have not received fill by intended date they must follow up with pharmacy.

## 2024-07-08 NOTE — Progress Notes (Signed)
 Specialty Pharmacy Ongoing Clinical Assessment Note  Latasha [name] is a 83 y.o. female who is being followed by the specialty pharmacy service for [facility] Oncology   Patient's specialty medication(s) reviewed today: Niraparib  Tosylate (ZEJULA )   Missed doses in the last 4 weeks: 0   Patient/Caregiver did not have any additional questions or concerns.   Therapeutic benefit summary: Patient is achieving benefit   Adverse events/side effects summary: No adverse events/side effects   Patient's therapy is appropriate to: Continue    Goals Addressed             This Visit's Progress    Achieve a cure       Patient is on track. Patient will maintain adherence. Per provider note from 04/17/24 patient remains stable and next follow up with scans will be January 2026.        Follow up: 6 months  Joscelin Fray M Jericka Kadar Specialty Pharmacist

## 2024-07-09 ENCOUNTER — Other Ambulatory Visit: Payer: Self-pay

## 2024-07-09 MED ORDER — NIRAPARIB TOSYLATE 200 MG PO TABS
200.0000 mg | ORAL_TABLET | Freq: Every day | ORAL | 5 refills | Status: AC
  Filled 2024-07-09: qty 30, 30d supply, fill #0
  Filled 2024-08-08: qty 30, 30d supply, fill #1
  Filled 2024-09-05: qty 30, 30d supply, fill #2
  Filled 2024-10-03: qty 30, 30d supply, fill #3

## 2024-07-09 NOTE — Telephone Encounter (Signed)
 Chart reviewed. Zejula  refilled per last office note with Dr. Rogers.

## 2024-07-15 ENCOUNTER — Other Ambulatory Visit: Payer: Self-pay

## 2024-08-08 ENCOUNTER — Other Ambulatory Visit: Payer: Self-pay | Admitting: Pharmacy Technician

## 2024-08-08 ENCOUNTER — Other Ambulatory Visit: Payer: Self-pay

## 2024-08-08 NOTE — Progress Notes (Signed)
 Specialty Pharmacy Refill Coordination Note  Latasha [name] is a 83 y.o. female contacted today regarding refills of specialty medication(s) Niraparib  Tosylate (ZEJULA )   Patient requested Delivery   Delivery date: 08/13/24   Verified address: [address] [place]   Medication will be filled on: 08/12/24

## 2024-08-12 ENCOUNTER — Other Ambulatory Visit: Payer: Self-pay

## 2024-09-04 ENCOUNTER — Encounter: Payer: Self-pay | Admitting: Gynecologic Oncology

## 2024-09-05 ENCOUNTER — Other Ambulatory Visit: Payer: Self-pay

## 2024-09-05 ENCOUNTER — Other Ambulatory Visit: Payer: Self-pay | Admitting: Oncology

## 2024-09-05 ENCOUNTER — Other Ambulatory Visit: Payer: Self-pay | Admitting: *Deleted

## 2024-09-05 DIAGNOSIS — C562 Malignant neoplasm of left ovary: Secondary | ICD-10-CM

## 2024-09-05 NOTE — Progress Notes (Signed)
 Specialty Pharmacy Refill Coordination Note  Latasha [name] is a 83 y.o. female contacted today regarding refills of specialty medication(s) Niraparib  Tosylate (ZEJULA )   Patient requested Delivery   Delivery date: 09/11/24   Verified address: [address] [place]   Medication will be filled on: 09/10/24

## 2024-09-10 ENCOUNTER — Inpatient Hospital Stay: Payer: Medicare Other | Attending: Gynecologic Oncology | Admitting: Gynecologic Oncology

## 2024-09-10 VITALS — BP 158/72 | HR 79 | Temp 98.0°F | Resp 19 | Wt 152.0 lb

## 2024-09-10 DIAGNOSIS — C563 Malignant neoplasm of bilateral ovaries: Secondary | ICD-10-CM | POA: Diagnosis present

## 2024-09-10 DIAGNOSIS — C562 Malignant neoplasm of left ovary: Secondary | ICD-10-CM | POA: Diagnosis not present

## 2024-09-10 NOTE — Progress Notes (Signed)
 Gynecologic Oncology Return Clinic Visit  09/10/2024  Reason for Visit: Surveillance visit in the setting of history of ovarian cancer   Treatment History: Oncology History  Ovarian cancer (HCC)  04/25/2015 - 04/28/2015 Hospital Admission   Nausea/diarrhea.  Oncology consult completed on 04/27/2015.   04/25/2015 Tumor Marker   CA 125- 3902.      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   04/27/2015 Procedure   US  Paracentesis- A total of approximately 3700 mL of amber colored fluid was removed. A fluid sample was sent for laboratory analysis.   04/27/2015 Imaging   CT Chest- Mild left supraclavicular lymphadenopathy and moderate mediastinal lymphadenopathy involving the prevascular, subcarinal and bilateral pericardiophrenic nodal chains, likely metastatic.   04/27/2015 Pathology Results   Diagnosis PERITONEAL/ASCITIC FLUID(SPECIMEN 1 OF 1 COLLECTED 04/27/15): MALIGNANT CELLS CONSISTENT WITH METASTATIC ADENOCARCINOMA.  The immunophenotype is most consistent with a gynecologic primary, most likely ovary.   05/08/2015 Miscellaneous   Seen by Dr. Eloy- recommending Carboplatin /Paclitaxel  x 3 cycles and return visit to see her (within 1 week of administration of third cycle) to evaluate for optimal sequencing of treatment modalities.   05/13/2015 - 05/15/2015 Hospital Admission   Hospitalized for AKI   05/19/2015 - 07/01/2015 Chemotherapy   Carboplatin /Paclitaxel  x 3 cycles   07/28/2015 Surgery   Exploratory laparotomy with total abdominal hysterectomy, bilateral salpingo-oophorectomy, omentectomy radical tumor debulking for ovarian cancer; by Dr. Maurilio Eloy.   07/28/2015 Pathology Results   Uterus +/- tubes/ovaries, neoplastic, cervix - HIGH GRADE SEROUS CARCINOMA INVOLVING LEFT OVARY, LEFT FALLOPIAN TUBE AND RIGHT FALLOPIAN TUBE. - CERVIX,  ENDOMETRIUM AND MYOMETRIUM ARE FREE OF TUMOR. 2. Cul-de-sac biop...   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  The Breast/Ovarian gene panel offered by [facility] includes sequencing and rearrangement analysis for the following 20 genes:  ATM, BARD1, BRCA1, BRCA2, BRIP1, CDH1, CHEK2, EPCAM, FANCC, MLH1, MSH2, MSH6, NBN, PALB2, PMS2, PTEN, RAD51C, RAD51D, TP53, and XRCC2.   The report date is 12/14/2015.  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  The updated report date is October 29, 2019.   12/23/2015 Miscellaneous   Genetic counseling.  Genetic testing was normal, and did not reveal a deleterious mutation in these genes   03/07/2017 Imaging   CT C/A/P IMPRESSION: Chest Impression:   No evidence of metastatic disease in thorax.   Abdomen / Pelvis Impression:   1. Interval increase in size of solitary nodular peritoneal implant in the LEFT upper quadrant . 2. No additional evidence of peritoneal nodularity. 3. No ascites.   07/31/2017 Progression   CT C/A/P: IMPRESSION: 1. Left upper quadrant nodule continues to progress, now measuring 20 x 26 mm. 2. New nodule identified between the in adrenal gland, concerning for metastatic deposit. 3. Interval development of tiny nodules in the left lower quadrant mesenteric, concerning for metastases. 4. No ascites. 5. Status post total abdominal hysterectomy and omentectomy.    Latasha [name] is a very pleasant 83 year old G1 who was originally seen in consultation at the request of Dr Clotilda Marseilles for (clinical) stage IVB ovarian cancer. The patient developed  symptoms of progressive abdominal fullness and discomfort over the course of  several months. In August, 2016 she presented to the [facility] ER where imaging of the abdomen and pelvis was obtained for diarrhea, nausea and emesis. Imaging on 04/25/15 revealed large volume ascites, carcinomatosis and peritoneal masses measuring up to 5.1cm. The patient underwent paracentesis with cytology on 04/27/15 which revealed metastatic adenocarcinoma consistent on immunostains with gyn primary.   CT of the chest on 04/27/15 revealed mild left supraclavicular lymphadenopathy, and mediastinal lymphadenopathy consistent with metastatic disease. There were <84mm pulmonary nodules also seen which were indeterminant. There were trace pleural effusions seen.    CA 125 on 04/25/15: 3902.   She then went on to receive 3 cycles of paclitaxel  and carboplatin  chemotherapy with Dr Sally. Cycle 3 was on 07/01/15.   CA 125 on 07/01/15 was 249.   CT scan on 07/20/15 showed: Previously noted ascites has resolved. No pneumoperitoneum. Many of the previously noted peritoneal implants are no longer confidently identified on today's examination. There are few smaller implants noted, the largest of which is in th  left side of the abdomen anteriorly measuring 9 x 11 mm. There is also a 10 x 7 mm lesion superficial to the splenic flexure of the colon which is substantially smaller than the prior study (previously 2.8 x 1.7 cm).   There was resolution of the chest adenopathy.   She has tolerated chemotherapy well with a good appetites and good general function.   On 07/31/15 she was taken to the OR for an ex lap, TAH, BSO, omentectomy, radical tumor debulking. Intraoperative findings included: Excellent response to neoadjuvant chemotherapy. Small volume plaques/nodules in left and right posterior cul de sac behind uterus, tumor on left ovary (3cm), multiple 2cm nodules in omentum. No other peritoneal disease. Diaphragm normal.    She had an optimal/complete cytoreduction (R0) with no gross visible disease  remaining.  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   Postoperatively she did very well with no postoperative complications.   She went on to receive 3 additional adjuvant cycles of carboplatin  and paclitaxel  completed April, 2017. She tolerated therapy very well.  Post treatment scans were unremarkable and showed no residual disease.   Her CA 125 was monitored at 3 monthly intervals. In January, 2018 it was normal at 17. On April, 20th ,2018 it had increased to 32. On June 15th, 2018 it had increased again to 47. CT chest/abdo/pelvis on 03/07/17 showed solitary pertoneal nodule in the gastrosplenic ligament measuring 18mm x 16mm (increased from a prior scan where it was 1cm). No additional nodularity is noted nor ascites.   She was offered secondary cytoreduction surgery followed by chemotherapy, however she declined this and opted for expectant management because she felt good.   On 07/31/17 repeat CT abdo/pelvis was performed. It showed progression of peritoneal disease with an enlargement of a peri-splenic lesion, a new nodule adjacent to the adrenal gland and a new left lower quadrant peritoneal metastases.   She agreed to commend salvage 2nd line chemotherapy with carboplatin  and paclitaxel  in December, 2018 due to the onset of symptomatic recurrence. She completed 6 cycles of this on 11/30/17 and was then changed to Niraparib  maintenance 300mg  daily starting in March, 2019.   CA 125 was normal at 9 on 03/26/18.  CT scan abd/pelvis in July, 2019 was normal with no evidence of recurrent/progressive disease.  CT scan in December, 2019 was stable with no progression.   CA  125 on 11/15/18 was normal and stable at 6.7.  CT scan abd/pelvis on 11/15/18 showed no new progression or recurrence.    CT abd/pelvis on 07/12/19 showed no evidence of recurrent/progressive disease.   CT chest/abd/pelvis on 12/17/19 showed no evidence of recurrent disease.    CT  scan on 01/08/21 was normal and showed no evidence of recurrence.   CT 10/2022 and most recently in 06/2023 showed no evidence of recurrence.  Port removed in 01/2023.  Last CA 125 on 04/10/24 at 7.4   Interval History: Presents today for continued follow up. Reports doing well overall. She is being treated for glaucoma. Denies any abdominal or pelvic pain.  Reports good appetite.  Endorses normal bowel and bladder function. Seeing a kidney specialist. Continues to take colace to prevent constipation. No symptoms concerning for recurrence voiced.   Past Medical/Surgical History: Past Medical History:  Diagnosis Date   Arthritis    B12 deficiency    Cataract    History of blood transfusion    History of chemotherapy    Hypertension    Iron deficiency anemia    Mixed hyperlipidemia    Ovarian cancer (HCC)    Regional enteritis (HCC)    Schizophrenia (HCC)    Ulcerative colitis (HCC)    Vitamin D  deficiency     Past Surgical History:  Procedure Laterality Date   ABDOMINAL HYSTERECTOMY N/A 07/28/2015   Procedure: TOTAL HYSTERECTOMY ABDOMINAL BILATERAL SALPINGO OOPHRORECTOMY;  Surgeon: Maurilio Ship, MD;  Location: [facility];  Service: Gynecology;  Laterality: N/A;   DEBULKING N/A 07/28/2015   Procedure: DEBULKING;  Surgeon: Maurilio Ship, MD;  Location: [facility];  Service: Gynecology;  Laterality: N/A;   IR REMOVAL TUN ACCESS W/ PORT W/O FL MOD SED  01/25/2023   LAPAROTOMY N/A 07/28/2015   Procedure: EXPLORATORY LAPAROTOMY;  Surgeon: Maurilio Ship, MD;  Location: [facility];  Service: Gynecology;  Laterality: N/A;   OMENTECTOMY N/A 07/28/2015   Procedure: OMENTECTOMY ;  Surgeon: Maurilio Ship, MD;  Location: [facility];  Service: Gynecology;  Laterality: N/A;   PARACENTESIS  04/27/15   PORTACATH PLACEMENT Right 04/2015   REPLACEMENT TOTAL KNEE     right knee in 2003    Family History  Problem Relation Age of Onset   Congestive Heart Failure Mother    Seizures Sister    Prostate cancer Brother    Cancer  Paternal Uncle        1 uncle with cancer NOS   Lung cancer Maternal Grandmother    Hypertension Maternal Grandfather    Colon cancer Neg Hx    Breast cancer Neg Hx    Ovarian cancer Neg Hx    Endometrial cancer Neg Hx    Pancreatic cancer Neg Hx     Social History   Socioeconomic History   Marital status: Single    Spouse name: Not on file   Number of children: 1   Years of education: Not on file   Highest education level: Not on file  Occupational History   Not on file  Tobacco Use   Smoking status: Never   Smokeless tobacco: Never  Vaping Use   Vaping status: Never Used  Substance and Sexual Activity   Alcohol use: No   Drug use: No   Sexual activity: Not Currently  Other Topics Concern   Not on file  Social History Narrative   Not on file   Social Drivers of Health   Tobacco Use: Low Risk (09/14/2023)   Patient  History    Smoking Tobacco Use: Never    Smokeless Tobacco Use: Never    Passive Exposure: Not on file  Financial Resource Strain: Not on file  Food Insecurity: Not on file  Transportation Needs: Not on file  Physical Activity: Not on file  Stress: Not on file  Social Connections: Not on file  Depression (PHQ2-9): Low Risk (04/17/2024)   Depression (PHQ2-9)    PHQ-2 Score: 0  Alcohol Screen: Not on file  Housing: Not on file  Utilities: Not on file  Health Literacy: Not on file    Current Medications:  Current Outpatient Medications:    acetaminophen  (TYLENOL ) 500 MG tablet, Take 2 tablets (1,000 mg total) by mouth every 12 (twelve) hours., Disp: 30 tablet, Rfl: 0   aspirin  EC 81 MG tablet, Take 81 mg by mouth daily., Disp: , Rfl:    chlorthalidone (HYGROTON) 25 MG tablet, Take 25 mg by mouth every morning., Disp: , Rfl:    cholecalciferol (VITAMIN D ) 25 MCG (1000 UNIT) tablet, Take 1,000 Units by mouth daily., Disp: , Rfl:    cyanocobalamin  (VITAMIN B12) 1000 MCG tablet, TAKE ONE TABLET BY MOUTH EVERY DAY, Disp: 90 tablet, Rfl: 3   docusate  sodium (COLACE) 100 MG capsule, Take 100 mg by mouth 2 (two) times daily., Disp: , Rfl:    folic acid  (FOLVITE ) 1 MG tablet, Take 1 mg by mouth daily., Disp: , Rfl:    loratadine (CLARITIN) 10 MG tablet, Take 10 mg by mouth daily., Disp: , Rfl:    lovastatin (MEVACOR) 10 MG tablet, Take 10 mg by mouth at bedtime., Disp: , Rfl:    melatonin 5 MG TABS, Take 5 mg by mouth at bedtime., Disp: , Rfl:    metoprolol  tartrate (LOPRESSOR ) 25 MG tablet, Take 1 tablet (25 mg total) by mouth 2 (two) times daily., Disp: 60 tablet, Rfl: 6   mirtazapine  (REMERON ) 7.5 MG tablet, Take 7.5 mg by mouth at bedtime., Disp: , Rfl:    Multiple Vitamin (MULTIVITAMIN WITH MINERALS) TABS tablet, Take 1 tablet by mouth daily., Disp: , Rfl:    NIFEdipine (ADALAT CC) 30 MG 24 hr tablet, Take 30 mg by mouth 2 (two) times daily., Disp: , Rfl:    niraparib  tosylate (ZEJULA ) 200 MG tablet, Take 1 tablet (200 mg total) by mouth daily. May take at bedtime to reduce nausea and vomiting., Disp: 30 tablet, Rfl: 5   ondansetron  (ZOFRAN ) 8 MG tablet, Take 1 tablet (8 mg total) by mouth 2 (two) times daily as needed for refractory nausea / vomiting. Start on day 3 after chemo., Disp: 30 tablet, Rfl: 1   prochlorperazine  (COMPAZINE ) 10 MG tablet, Take 1 tablet (10 mg total) by mouth every 6 (six) hours as needed (Nausea or vomiting)., Disp: 60 tablet, Rfl: 3   QUEtiapine  (SEROQUEL  XR) 300 MG 24 hr tablet, Take 300 mg by mouth at bedtime., Disp: , Rfl:    sulfaSALAzine  (AZULFIDINE ) 500 MG tablet, Take 500 mg by mouth 3 (three) times daily., Disp: , Rfl:    traMADol  (ULTRAM ) 50 MG tablet, Take 1 tablet (50 mg total) by mouth daily., Disp: 60 tablet, Rfl: 0  Review of Systems: On ROS intake form, + for constipation, intermittent vulvar rash, intermittent fatigue, vision problems, depression. Denies appetite changes, fevers, chills, unexplained weight changes. Denies hearing loss, neck lumps or masses, mouth sores, ringing in ears or voice  changes. Denies cough or wheezing.  Denies shortness of breath. Denies chest pain or palpitations. Denies  leg swelling. Denies abdominal distention, pain, blood in stools, diarrhea, nausea, vomiting, or early satiety. Denies pain with intercourse, dysuria, frequency, hematuria or incontinence. Denies hot flashes, pelvic pain, vaginal bleeding or vaginal discharge.   Denies joint pain, back pain or muscle pain/cramps. Denies itching or wounds. Denies dizziness, headaches, numbness or seizures. Denies swollen lymph nodes or glands, denies easy bruising or bleeding. Denies anxiety, confusion, or decreased concentration.  Physical Exam: BP (!) 158/72 Comment: manual recheck, NP notified. Pt to monitor and f/u with PCP  Pulse 79   Temp 98 F (36.7 C) (Oral)   Resp 19   Wt 152 lb (68.9 kg)   SpO2 100%   BMI 24.53 kg/m  General: Alert, oriented, no acute distress. HEENT: Normocephalic, atraumatic, sclera anicteric. Chest: Clear to auscultation bilaterally.  No wheezes or rhonchi.   Cardiovascular: Regular rate and rhythm, no murmurs. Abdomen: nontender.  Normoactive bowel sounds.  No masses or hepatosplenomegaly appreciated.  Well-healed scar without nodularity. Extremities: Grossly normal range of motion.  Warm, well perfused.  No edema bilaterally. Skin: No rashes or lesions noted. Lymphatics: No cervical, supraclavicular, or inguinal adenopathy. GU: Normal appearing external genitalia without erythema, excoriation, or lesions.  Speculum exam reveals some anterior prolapse, cuff intact, no masses.  Bimanual exam reveals cuff smooth, no masses or nodularity.  Rectovaginal exam confirms findings.  Laboratory & Radiologic Studies:          Component Ref Range & Units (hover) 5 d ago (09/09/23) 2 mo ago (07/04/23) 6 mo ago (03/06/23) 10 mo ago (11/07/22) 1 yr ago (07/06/22) 1 yr ago (03/01/22) 1 yr ago (10/25/21)  Cancer Antigen (CA) 125 6.2 6.2 CM 6.5 CM 5.8 CM 6.4 CM 7.1 CM 6.4 CM      Assessment & Plan: Latasha [name] is a 83 y.o. woman with a history of recurrent platinum sensitive ovarian cancer. BRCA negative on testing. S/p salvage 2nd line chemotherapy with carb/taxol  completed 11/30/17. Now on Niraparib  since March, 2019. Last imaging 06/2023 was negative for metastatic disease. Tolerating maintenance PARPi with Zejula . Recommendation from prior GYO was to continue this indefinitely or up to five years (2024) as she is tolerating treatment well and is disease free.   Patient is overall doing very well and remains symptom-free.  She is NED on exam today.  Recent CA-125 was normal.   Plan has been continued yearly imaging while she is on Zejula . Imaging with labs including CA 125 ordered for January 2026.   Blood pressure was mildly elevated in clinic today.  Encouraged her to follow-up with her primary care provider and nephrologist. She monitors her BP at home and it usually runs lower.  She is asymptomatic today.   She and I reviewed signs and symptoms that would be concerning for disease recurrence and I stressed the importance of calling if she develops any of these before her next visit.  She is now over 6 years out from completion of salvage chemotherapy in the setting of recurrence.  She continues on PARPi maintenance and remains NED.  Needs continued monitoring given risk of late onset MDS/AML.  Plan for follow-up with [facility].  Given continued follow-up with medical oncologist, she can be discharged from our clinic and follow up as needed in the future.  20 minutes of total time was spent for this patient encounter, including preparation, face-to-face counseling with the patient and coordination of care, and documentation of the encounter.  Eleanor Epps NP [facility], [facility] GYN Oncology

## 2024-09-10 NOTE — Patient Instructions (Signed)
 It was great seeing you today. No concerning findings on today's exam.   Plan to follow up with Dr. Charmain team at [facility] for labs and CT imaging in January and follow beginning of February 2026.  Regular follow up with our office is no longer needed unless needs arise in the future but we recommend continued follow up with Dr. Charmain office.  Please call for any questions or concerns or new symptoms.

## 2024-10-03 ENCOUNTER — Other Ambulatory Visit (HOSPITAL_COMMUNITY): Payer: Self-pay

## 2024-10-03 ENCOUNTER — Other Ambulatory Visit: Payer: Self-pay | Admitting: Pharmacy Technician

## 2024-10-03 ENCOUNTER — Other Ambulatory Visit: Payer: Self-pay

## 2024-10-03 NOTE — Progress Notes (Signed)
 Specialty Pharmacy Refill Coordination Note  Latasha [name] is a 84 y.o. female contacted today regarding refills of specialty medication(s) Niraparib  Tosylate (ZEJULA )   Patient requested Delivery   Delivery date: 10/09/24   Verified address: [address] [place]   Medication will be filled on: 10/08/24

## 2024-10-08 ENCOUNTER — Other Ambulatory Visit: Payer: Self-pay

## 2024-10-09 ENCOUNTER — Other Ambulatory Visit: Payer: Self-pay

## 2024-10-18 ENCOUNTER — Ambulatory Visit (HOSPITAL_COMMUNITY)
Admission: RE | Admit: 2024-10-18 | Discharge: 2024-10-18 | Disposition: A | Source: Ambulatory Visit | Attending: Hematology | Admitting: Hematology

## 2024-10-18 ENCOUNTER — Inpatient Hospital Stay: Attending: Gynecologic Oncology

## 2024-10-18 DIAGNOSIS — C562 Malignant neoplasm of left ovary: Secondary | ICD-10-CM | POA: Diagnosis present

## 2024-10-18 LAB — COMPREHENSIVE METABOLIC PANEL WITH GFR
ALT: 30 U/L (ref 0–44)
AST: 41 U/L (ref 15–41)
Albumin: 4.9 g/dL (ref 3.5–5.0)
Alkaline Phosphatase: 102 U/L (ref 38–126)
Anion gap: 15 (ref 5–15)
BUN: 21 mg/dL (ref 8–23)
CO2: 25 mmol/L (ref 22–32)
Calcium: 9.9 mg/dL (ref 8.9–10.3)
Chloride: 100 mmol/L (ref 98–111)
Creatinine, Ser: 1.17 mg/dL — ABNORMAL HIGH (ref 0.44–1.00)
GFR, Estimated: 47 mL/min — ABNORMAL LOW
Glucose, Bld: 97 mg/dL (ref 70–99)
Potassium: 4.2 mmol/L (ref 3.5–5.1)
Sodium: 140 mmol/L (ref 135–145)
Total Bilirubin: 0.4 mg/dL (ref 0.0–1.2)
Total Protein: 8.1 g/dL (ref 6.5–8.1)

## 2024-10-18 LAB — CBC WITH DIFFERENTIAL/PLATELET
Abs Immature Granulocytes: 0.04 10*3/uL (ref 0.00–0.07)
Basophils Absolute: 0.1 10*3/uL (ref 0.0–0.1)
Basophils Relative: 1 %
Eosinophils Absolute: 0.4 10*3/uL (ref 0.0–0.5)
Eosinophils Relative: 6 %
HCT: 33.7 % — ABNORMAL LOW (ref 36.0–46.0)
Hemoglobin: 11.1 g/dL — ABNORMAL LOW (ref 12.0–15.0)
Immature Granulocytes: 1 %
Lymphocytes Relative: 26 %
Lymphs Abs: 1.7 10*3/uL (ref 0.7–4.0)
MCH: 35.1 pg — ABNORMAL HIGH (ref 26.0–34.0)
MCHC: 32.9 g/dL (ref 30.0–36.0)
MCV: 106.6 fL — ABNORMAL HIGH (ref 80.0–100.0)
Monocytes Absolute: 0.8 10*3/uL (ref 0.1–1.0)
Monocytes Relative: 13 %
Neutro Abs: 3.4 10*3/uL (ref 1.7–7.7)
Neutrophils Relative %: 53 %
Platelets: 349 10*3/uL (ref 150–400)
RBC: 3.16 MIL/uL — ABNORMAL LOW (ref 3.87–5.11)
RDW: 12.8 % (ref 11.5–15.5)
WBC: 6.4 10*3/uL (ref 4.0–10.5)
nRBC: 0 % (ref 0.0–0.2)

## 2024-10-18 MED ORDER — IOHEXOL 300 MG/ML  SOLN
80.0000 mL | Freq: Once | INTRAMUSCULAR | Status: AC | PRN
  Administered 2024-10-18: 80 mL via INTRAVENOUS

## 2024-10-18 MED ORDER — IOHEXOL 9 MG/ML PO SOLN
ORAL | Status: AC
  Filled 2024-10-18: qty 1000

## 2024-10-19 LAB — CA 125: Cancer Antigen (CA) 125: 8 U/mL (ref 0.0–38.1)

## 2024-10-24 ENCOUNTER — Other Ambulatory Visit (HOSPITAL_COMMUNITY): Payer: Self-pay

## 2024-10-25 ENCOUNTER — Inpatient Hospital Stay: Attending: Gynecologic Oncology | Admitting: Oncology

## 2024-10-25 DIAGNOSIS — D529 Folate deficiency anemia, unspecified: Secondary | ICD-10-CM

## 2024-10-25 DIAGNOSIS — D508 Other iron deficiency anemias: Secondary | ICD-10-CM

## 2024-10-25 DIAGNOSIS — C562 Malignant neoplasm of left ovary: Secondary | ICD-10-CM

## 2024-10-25 NOTE — Progress Notes (Signed)
 "  [name] OFFICE PROGRESS NOTE  Katrinka Aquas, MD  ASSESSMENT & PLAN:  Assessment and Plan Assessment & Plan Stage IVB ovarian cancer, left, on maintenance niraparib  therapy No evidence of disease progression on maintenance niraparib . Normal CA-125 and surveillance CT. Rash managed with topical therapy. New vaginal lesion and rectal pruritus under observation. No symptoms of recurrence or complications. - Continued niraparib  200 mg daily until disease progression. - Instructed her to monitor for new or worsening symptoms, including rash and vaginal lesion, and to report any changes. - Continued annual surveillance imaging (CT scan). - Planned repeat CA-125 every six months. - Scheduled six-month follow-up with laboratory evaluation only.  Anemia Hemoglobin improved to 11.1 g/dL. Persistent macrocytosis with normal leukocyte and platelet counts. Elevated B12 levels. Stable renal and hepatic function. - Advised discontinuation of B12 supplementation. - Planned repeat B12 and iron studies in six months.       Orders Placed This Encounter  Procedures   Copper , serum    Standing Status:   Future    Expected Date:   04/24/2025    Expiration Date:   07/23/2025   Vitamin B12    Standing Status:   Future    Expected Date:   04/24/2025    Expiration Date:   07/23/2025   Methylmalonic acid, serum    Standing Status:   Future    Expected Date:   04/24/2025    Expiration Date:   07/23/2025   Ferritin    Standing Status:   Future    Expected Date:   04/24/2025    Expiration Date:   07/23/2025   Iron and TIBC ([facility] ONLY)    Standing Status:   Future    Expected Date:   04/24/2025    Expiration Date:   07/23/2025   CBC with Differential    Standing Status:   Future    Expected Date:   04/24/2025    Expiration Date:   07/23/2025   Comprehensive metabolic panel    Standing Status:   Future    Expected Date:   04/24/2025    Expiration Date:   07/23/2025   CA  125    Standing Status:   Future    Expected Date:   04/24/2025    Expiration Date:   07/23/2025    INTERVAL HISTORY: Discussed the use of AI scribe software for clinical note transcription with the patient, who gave verbal consent to proceed.  History of Present Illness [name] is an 84 year old female with stage 4B ovarian cancer who presents for routine oncology surveillance.  She was diagnosed with stage 4B ovarian cancer and initially treated with three cycles of carboplatin /paclitaxel , followed by debulking surgery on 08/07/2015, and three additional cycles completed on 10/07/2015. She subsequently received six more cycles of carboplatin /paclitaxel  from November 2018 through March 2019. Germline mutation testing was negative. Maintenance niraparib  was started at 300 mg daily in April 2019 and reduced to 200 mg daily on 01/17/2024. She continues on niraparib  and tolerates it well. Surveillance CT scan of the abdomen on 10/18/2024 showed no acute intra-abdominal or pelvic abnormality and no evidence of distant metastatic disease. CA-125 is normal at 8.0. She is under regular surveillance with annual imaging and tumor markers every six months.  Since the dose reduction of niraparib  to 200 mg daily, she has developed cutaneous rashes, which were not present at the higher dose. The rashes have been managed with topical corticosteroids. She also describes a recent episode  of pruritus ani, which has improved with topical emollients, and denies any rectal bleeding. Additionally, she has noticed a small, non-tender vulvar mass, present for approximately one week, which she describes as cystic. She is monitoring this for any changes.  She has chronic anemia, with most recent hemoglobin of 11.1, improved from 10.0 six months ago. Macrocytosis is present (MCV 106.6), but white blood cell and platelet counts are normal. She is not currently taking iron supplements and was previously advised to discontinue  B12 supplementation due to elevated levels. Renal function has improved, and liver function is normal.     SUMMARY OF HEMATOLOGIC HISTORY: Oncology History  Ovarian cancer (HCC)  04/25/2015 - 04/28/2015 Hospital Admission   Nausea/diarrhea.  Oncology consult completed on 04/27/2015.   04/25/2015 Tumor Marker   CA 125- 3902.      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   04/27/2015 Procedure   US  Paracentesis- A total of approximately 3700 mL of amber colored fluid was removed. A fluid sample was sent for laboratory analysis.   04/27/2015 Imaging   CT Chest- Mild left supraclavicular lymphadenopathy and moderate mediastinal lymphadenopathy involving the prevascular, subcarinal and bilateral pericardiophrenic nodal chains, likely metastatic.   04/27/2015 Pathology Results   Diagnosis PERITONEAL/ASCITIC FLUID(SPECIMEN 1 OF 1 COLLECTED 04/27/15): MALIGNANT CELLS CONSISTENT WITH METASTATIC ADENOCARCINOMA.  The immunophenotype is most consistent with a gynecologic primary, most likely ovary.   05/08/2015 Miscellaneous   Seen by Dr. Eloy- recommending Carboplatin /Paclitaxel  x 3 cycles and return visit to see her (within 1 week of administration of third cycle) to evaluate for optimal sequencing of treatment modalities.   05/13/2015 - 05/15/2015 Hospital Admission   Hospitalized for AKI   05/19/2015 - 07/01/2015 Chemotherapy   Carboplatin /Paclitaxel  x 3 cycles   07/28/2015 Surgery   Exploratory laparotomy with total abdominal hysterectomy, bilateral salpingo-oophorectomy, omentectomy radical tumor debulking for ovarian cancer; by Dr. Maurilio Eloy.   07/28/2015 Pathology Results   Uterus +/- tubes/ovaries, neoplastic, cervix - HIGH GRADE SEROUS CARCINOMA INVOLVING LEFT OVARY, LEFT FALLOPIAN TUBE AND RIGHT FALLOPIAN TUBE. - CERVIX, ENDOMETRIUM AND  MYOMETRIUM ARE FREE OF TUMOR. 2. Cul-de-sac biop...   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  The Breast/Ovarian gene panel offered by [facility] includes sequencing and rearrangement analysis for the following 20 genes:  ATM, BARD1, BRCA1, BRCA2, BRIP1, CDH1, CHEK2, EPCAM, FANCC, MLH1, MSH2, MSH6, NBN, PALB2, PMS2, PTEN, RAD51C, RAD51D, TP53, and XRCC2.   The report date is 12/14/2015.  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  The updated report date is October 29, 2019.   12/23/2015 Miscellaneous   Genetic counseling.  Genetic testing was normal, and did not reveal a deleterious mutation in these genes   03/07/2017 Imaging   CT C/A/P IMPRESSION: Chest Impression:   No evidence of metastatic disease in thorax.   Abdomen / Pelvis Impression:   1. Interval increase in size of solitary nodular peritoneal implant in the LEFT upper quadrant . 2. No additional evidence of peritoneal nodularity. 3. No ascites.   07/31/2017 Progression   CT C/A/P: IMPRESSION: 1. Left upper quadrant nodule  continues to progress, now measuring 20 x 26 mm. 2. New nodule identified between the in adrenal gland, concerning for metastatic deposit. 3. Interval development of tiny nodules in the left lower quadrant mesenteric, concerning for metastases. 4. No ascites. 5. Status post total abdominal hysterectomy and omentectomy.      CBC    Component Value Date/Time   WBC 6.4 10/18/2024 1056   RBC 3.16 (L) 10/18/2024 1056   HGB 11.1 (L) 10/18/2024 1056   HGB 11.1 (L) 06/05/2020 1458   HCT 33.7 (L) 10/18/2024 1056   PLT 349 10/18/2024 1056   PLT 328 06/05/2020 1458   MCV 106.6 (H) 10/18/2024 1056    MCH 35.1 (H) 10/18/2024 1056   MCHC 32.9 10/18/2024 1056   RDW 12.8 10/18/2024 1056   LYMPHSABS 1.7 10/18/2024 1056   MONOABS 0.8 10/18/2024 1056   EOSABS 0.4 10/18/2024 1056   BASOSABS 0.1 10/18/2024 1056       Latest Ref Rng & Units 10/18/2024   10:56 AM 04/10/2024   10:50 AM 01/10/2024   10:45 AM  CMP  Glucose 70 - 99 mg/dL 97  899  897   BUN 8 - 23 mg/dL 21  21  27    Creatinine 0.44 - 1.00 mg/dL 8.82  8.46  7.89   Sodium 135 - 145 mmol/L 140  139  138   Potassium 3.5 - 5.1 mmol/L 4.2  4.3  3.7   Chloride 98 - 111 mmol/L 100  103  102   CO2 22 - 32 mmol/L 25  27  26    Calcium 8.9 - 10.3 mg/dL 9.9  9.9  9.7   Total Protein 6.5 - 8.1 g/dL 8.1  7.6  7.8   Total Bilirubin 0.0 - 1.2 mg/dL 0.4  0.4  0.6   Alkaline Phos 38 - 126 U/L 102  79  77   AST 15 - 41 U/L 41  37  34   ALT 0 - 44 U/L 30  32  29      Lab Results  Component Value Date   FERRITIN 784 (H) 01/10/2024   VITAMINB12 923 (H) 01/10/2024    Vitals:   10/25/24 1010 10/25/24 1015  BP: (!) 160/82 (!) 157/77  Pulse: 72   Resp: 18   Temp: 97.6 F (36.4 C)   SpO2: 100%     Review of System:  Review of Systems  Constitutional:  Positive for malaise/fatigue. Negative for weight loss.  Skin:  Positive for itching and rash.    Physical Exam: Physical Exam Constitutional:      Appearance: Normal appearance.  HENT:     Head: Normocephalic and atraumatic.  Eyes:     Pupils: Pupils are equal, round, and reactive to light.  Cardiovascular:     Rate and Rhythm: Normal rate and regular rhythm.     Heart sounds: Normal heart sounds. No murmur heard. Pulmonary:     Effort: Pulmonary effort is normal.     Breath sounds: Normal breath sounds. No wheezing.  Abdominal:     General: Bowel sounds are normal. There is no distension.     Palpations: Abdomen is soft.     Tenderness: There is no abdominal tenderness.  Musculoskeletal:        General: Normal range of motion.     Cervical back: Normal range of motion.   Skin:    General: Skin is warm and dry.     Findings: No rash.  Neurological:     Mental  Status: She is alert and oriented to person, place, and time.     Gait: Gait is intact.  Psychiatric:        Mood and Affect: Mood and affect normal.        Cognition and Memory: Memory normal.        Judgment: Judgment normal.      I spent 25 minutes dedicated to the care of this patient (face-to-face and non-face-to-face) on the date of the encounter to include what is described in the assessment and plan.,  Delon Hope, NP 10/25/2024 1:30 PM "

## 2025-04-25 ENCOUNTER — Inpatient Hospital Stay

## 2025-05-02 ENCOUNTER — Inpatient Hospital Stay: Admitting: Oncology
